# Patient Record
Sex: Male | Born: 1970
Health system: Southern US, Community
[De-identification: ages and names within clinical notes are randomized; demographics above are authoritative.]

## PROBLEM LIST (undated history)

## (undated) DIAGNOSIS — L97929 Non-pressure chronic ulcer of unspecified part of left lower leg with unspecified severity: Secondary | ICD-10-CM

## (undated) DIAGNOSIS — L97509 Non-pressure chronic ulcer of other part of unspecified foot with unspecified severity: Secondary | ICD-10-CM

## (undated) DIAGNOSIS — F411 Generalized anxiety disorder: Secondary | ICD-10-CM

## (undated) DIAGNOSIS — Z8669 Personal history of other diseases of the nervous system and sense organs: Secondary | ICD-10-CM

## (undated) DIAGNOSIS — E119 Type 2 diabetes mellitus without complications: Secondary | ICD-10-CM

## (undated) DIAGNOSIS — M869 Osteomyelitis, unspecified: Secondary | ICD-10-CM

## (undated) DIAGNOSIS — I700 Atherosclerosis of aorta: Secondary | ICD-10-CM

## (undated) DIAGNOSIS — K219 Gastro-esophageal reflux disease without esophagitis: Secondary | ICD-10-CM

## (undated) DIAGNOSIS — F32A Depression, unspecified: Secondary | ICD-10-CM

## (undated) DIAGNOSIS — L089 Local infection of the skin and subcutaneous tissue, unspecified: Secondary | ICD-10-CM

## (undated) DIAGNOSIS — E11628 Type 2 diabetes mellitus with other skin complications: Secondary | ICD-10-CM

## (undated) DIAGNOSIS — F320 Major depressive disorder, single episode, mild: Secondary | ICD-10-CM

## (undated) DIAGNOSIS — I83029 Varicose veins of left lower extremity with ulcer of unspecified site: Secondary | ICD-10-CM

## (undated) DIAGNOSIS — G4733 Obstructive sleep apnea (adult) (pediatric): Secondary | ICD-10-CM

## (undated) DIAGNOSIS — G4730 Sleep apnea, unspecified: Secondary | ICD-10-CM

## (undated) DIAGNOSIS — E785 Hyperlipidemia, unspecified: Secondary | ICD-10-CM

## (undated) DIAGNOSIS — I10 Essential (primary) hypertension: Secondary | ICD-10-CM

## (undated) DIAGNOSIS — I440 Atrioventricular block, first degree: Secondary | ICD-10-CM

## (undated) HISTORY — DX: Local infection of the skin and subcutaneous tissue, unspecified: E11.628

## (undated) HISTORY — DX: Local infection of the skin and subcutaneous tissue, unspecified: L08.9

## (undated) HISTORY — DX: Hyperlipidemia, unspecified: E78.5

## (undated) HISTORY — DX: Obstructive sleep apnea (adult) (pediatric): G47.33

## (undated) HISTORY — DX: Osteomyelitis, unspecified: M86.9

## (undated) HISTORY — PX: NO PAST SURGERIES: SHX2092

## (undated) HISTORY — DX: Sleep apnea, unspecified: G47.30

## (undated) HISTORY — DX: Type 2 diabetes mellitus without complications: E11.9

## (undated) HISTORY — DX: Personal history of other diseases of the nervous system and sense organs: Z86.69

## (undated) HISTORY — DX: Gastro-esophageal reflux disease without esophagitis: K21.9

---

## 2014-03-02 ENCOUNTER — Encounter (HOSPITAL_COMMUNITY): Payer: Self-pay

## 2014-03-02 ENCOUNTER — Emergency Department (INDEPENDENT_AMBULATORY_CARE_PROVIDER_SITE_OTHER)
Admission: EM | Admit: 2014-03-02 | Discharge: 2014-03-02 | Disposition: A | Discharge status: Nursing Home (Includes ICF) | Payer: Self-pay | Source: Home / Self Care | Attending: Family Medicine | Admitting: Family Medicine

## 2014-03-02 ENCOUNTER — Encounter (HOSPITAL_COMMUNITY): Payer: Self-pay | Admitting: Family Medicine

## 2014-03-02 ENCOUNTER — Emergency Department (HOSPITAL_COMMUNITY): Payer: Self-pay

## 2014-03-02 ENCOUNTER — Inpatient Hospital Stay (HOSPITAL_COMMUNITY)
Admission: EM | Admit: 2014-03-02 | Discharge: 2014-03-06 | DRG: 540 | Disposition: A | Discharge status: Home / Self Care | Payer: Self-pay | Attending: Internal Medicine | Admitting: Internal Medicine

## 2014-03-02 DIAGNOSIS — L8994 Pressure ulcer of unspecified site, stage 4: Secondary | ICD-10-CM

## 2014-03-02 DIAGNOSIS — L03116 Cellulitis of left lower limb: Secondary | ICD-10-CM | POA: Diagnosis present

## 2014-03-02 DIAGNOSIS — I10 Essential (primary) hypertension: Secondary | ICD-10-CM | POA: Diagnosis present

## 2014-03-02 DIAGNOSIS — R609 Edema, unspecified: Secondary | ICD-10-CM

## 2014-03-02 DIAGNOSIS — E878 Other disorders of electrolyte and fluid balance, not elsewhere classified: Secondary | ICD-10-CM | POA: Diagnosis present

## 2014-03-02 DIAGNOSIS — E669 Obesity, unspecified: Secondary | ICD-10-CM | POA: Diagnosis present

## 2014-03-02 DIAGNOSIS — M868X6 Other osteomyelitis, lower leg: Principal | ICD-10-CM | POA: Diagnosis present

## 2014-03-02 DIAGNOSIS — Z6841 Body Mass Index (BMI) 40.0 and over, adult: Secondary | ICD-10-CM

## 2014-03-02 DIAGNOSIS — I83009 Varicose veins of unspecified lower extremity with ulcer of unspecified site: Secondary | ICD-10-CM | POA: Diagnosis present

## 2014-03-02 DIAGNOSIS — F17210 Nicotine dependence, cigarettes, uncomplicated: Secondary | ICD-10-CM | POA: Diagnosis present

## 2014-03-02 DIAGNOSIS — M869 Osteomyelitis, unspecified: Secondary | ICD-10-CM

## 2014-03-02 DIAGNOSIS — L0390 Cellulitis, unspecified: Secondary | ICD-10-CM | POA: Diagnosis present

## 2014-03-02 DIAGNOSIS — L02612 Cutaneous abscess of left foot: Secondary | ICD-10-CM

## 2014-03-02 DIAGNOSIS — L309 Dermatitis, unspecified: Secondary | ICD-10-CM | POA: Diagnosis present

## 2014-03-02 DIAGNOSIS — B964 Proteus (mirabilis) (morganii) as the cause of diseases classified elsewhere: Secondary | ICD-10-CM | POA: Diagnosis present

## 2014-03-02 DIAGNOSIS — L97529 Non-pressure chronic ulcer of other part of left foot with unspecified severity: Secondary | ICD-10-CM | POA: Diagnosis present

## 2014-03-02 DIAGNOSIS — Z720 Tobacco use: Secondary | ICD-10-CM | POA: Diagnosis present

## 2014-03-02 DIAGNOSIS — L089 Local infection of the skin and subcutaneous tissue, unspecified: Secondary | ICD-10-CM | POA: Diagnosis present

## 2014-03-02 DIAGNOSIS — R739 Hyperglycemia, unspecified: Secondary | ICD-10-CM | POA: Diagnosis present

## 2014-03-02 DIAGNOSIS — L97909 Non-pressure chronic ulcer of unspecified part of unspecified lower leg with unspecified severity: Secondary | ICD-10-CM | POA: Diagnosis present

## 2014-03-02 HISTORY — DX: Non-pressure chronic ulcer of other part of unspecified foot with unspecified severity: L97.509

## 2014-03-02 HISTORY — DX: Essential (primary) hypertension: I10

## 2014-03-02 HISTORY — DX: Osteomyelitis, unspecified: M86.9

## 2014-03-02 HISTORY — DX: Varicose veins of left lower extremity with ulcer of unspecified site: I83.029

## 2014-03-02 HISTORY — DX: Varicose veins of left lower extremity with ulcer of unspecified site: L97.929

## 2014-03-02 LAB — I-STAT CHEM 8, ED
BUN: 29 mg/dL — ABNORMAL HIGH (ref 6–23)
CALCIUM ION: 1.01 mmol/L — AB (ref 1.12–1.23)
CHLORIDE: 95 meq/L — AB (ref 96–112)
Creatinine, Ser: 0.8 mg/dL (ref 0.50–1.35)
GLUCOSE: 111 mg/dL — AB (ref 70–99)
HEMATOCRIT: 52 % (ref 39.0–52.0)
HEMOGLOBIN: 17.7 g/dL — AB (ref 13.0–17.0)
Potassium: 5 mEq/L (ref 3.7–5.3)
Sodium: 132 mEq/L — ABNORMAL LOW (ref 137–147)
TCO2: 28 mmol/L (ref 0–100)

## 2014-03-02 LAB — CBC WITH DIFFERENTIAL/PLATELET
BASOS ABS: 0 10*3/uL (ref 0.0–0.1)
BASOS PCT: 0 % (ref 0–1)
Eosinophils Absolute: 0 10*3/uL (ref 0.0–0.7)
Eosinophils Relative: 0 % (ref 0–5)
HEMATOCRIT: 46.3 % (ref 39.0–52.0)
Hemoglobin: 15.5 g/dL (ref 13.0–17.0)
LYMPHS PCT: 11 % — AB (ref 12–46)
Lymphs Abs: 0.8 10*3/uL (ref 0.7–4.0)
MCH: 30.6 pg (ref 26.0–34.0)
MCHC: 33.5 g/dL (ref 30.0–36.0)
MCV: 91.5 fL (ref 78.0–100.0)
MONO ABS: 0.8 10*3/uL (ref 0.1–1.0)
Monocytes Relative: 11 % (ref 3–12)
Neutro Abs: 5.8 10*3/uL (ref 1.7–7.7)
Neutrophils Relative %: 78 % — ABNORMAL HIGH (ref 43–77)
PLATELETS: 272 10*3/uL (ref 150–400)
RBC: 5.06 MIL/uL (ref 4.22–5.81)
RDW: 12.9 % (ref 11.5–15.5)
WBC: 7.5 10*3/uL (ref 4.0–10.5)

## 2014-03-02 LAB — I-STAT CG4 LACTIC ACID, ED: LACTIC ACID, VENOUS: 2.2 mmol/L (ref 0.5–2.2)

## 2014-03-02 MED ORDER — MORPHINE SULFATE 2 MG/ML IJ SOLN
2.0000 mg | INTRAMUSCULAR | Status: DC | PRN
  Filled 2014-03-02: qty 1

## 2014-03-02 MED ORDER — PIPERACILLIN-TAZOBACTAM 3.375 G IVPB 30 MIN
3.3750 g | Freq: Once | INTRAVENOUS | Status: AC
  Administered 2014-03-02: 3.375 g via INTRAVENOUS
  Filled 2014-03-02: qty 50

## 2014-03-02 MED ORDER — INFLUENZA VAC SPLIT QUAD 0.5 ML IM SUSY
0.5000 mL | PREFILLED_SYRINGE | INTRAMUSCULAR | Status: AC
  Administered 2014-03-03: 0.5 mL via INTRAMUSCULAR
  Filled 2014-03-02: qty 0.5

## 2014-03-02 MED ORDER — SODIUM CHLORIDE 0.9 % IV SOLN: INTRAVENOUS | Status: DC

## 2014-03-02 MED ORDER — ONDANSETRON HCL 4 MG/2ML IJ SOLN: 4.0000 mg | Freq: Four times a day (QID) | INTRAMUSCULAR | Status: DC | PRN

## 2014-03-02 MED ORDER — VANCOMYCIN HCL 10 G IV SOLR
1500.0000 mg | Freq: Once | INTRAVENOUS | Status: AC
  Administered 2014-03-02: 1500 mg via INTRAVENOUS
  Filled 2014-03-02: qty 1500

## 2014-03-02 MED ORDER — MAGNESIUM HYDROXIDE 400 MG/5ML PO SUSP: 30.0000 mL | Freq: Every day | ORAL | Status: DC | PRN

## 2014-03-02 MED ORDER — ACETAMINOPHEN 650 MG RE SUPP: 650.0000 mg | Freq: Four times a day (QID) | RECTAL | Status: DC | PRN

## 2014-03-02 MED ORDER — ENOXAPARIN SODIUM 40 MG/0.4ML ~~LOC~~ SOLN
40.0000 mg | SUBCUTANEOUS | Status: DC
  Administered 2014-03-02 – 2014-03-03 (×2): 40 mg via SUBCUTANEOUS
  Filled 2014-03-02 (×3): qty 0.4

## 2014-03-02 MED ORDER — VANCOMYCIN HCL 10 G IV SOLR
1250.0000 mg | Freq: Two times a day (BID) | INTRAVENOUS | Status: DC
  Administered 2014-03-03 – 2014-03-05 (×5): 1250 mg via INTRAVENOUS
  Filled 2014-03-02 (×7): qty 1250

## 2014-03-02 MED ORDER — ACETAMINOPHEN 325 MG PO TABS: 650.0000 mg | ORAL_TABLET | Freq: Four times a day (QID) | ORAL | Status: DC | PRN

## 2014-03-02 MED ORDER — HYDROCODONE-ACETAMINOPHEN 5-325 MG PO TABS
1.0000 | ORAL_TABLET | ORAL | Status: DC | PRN
  Filled 2014-03-02: qty 2

## 2014-03-02 MED ORDER — CETYLPYRIDINIUM CHLORIDE 0.05 % MT LIQD: 7.0000 mL | Freq: Two times a day (BID) | OROMUCOSAL | Status: DC

## 2014-03-02 MED ORDER — ONDANSETRON HCL 4 MG PO TABS
4.0000 mg | ORAL_TABLET | Freq: Four times a day (QID) | ORAL | Status: DC | PRN
Start: 2014-03-02 — End: 2014-03-06

## 2014-03-02 MED ORDER — ZOLPIDEM TARTRATE 5 MG PO TABS: 5.0000 mg | ORAL_TABLET | Freq: Every evening | ORAL | Status: DC | PRN

## 2014-03-02 MED ORDER — VANCOMYCIN HCL IN DEXTROSE 1-5 GM/200ML-% IV SOLN
1000.0000 mg | Freq: Once | INTRAVENOUS | Status: AC
  Administered 2014-03-02: 1000 mg via INTRAVENOUS
  Filled 2014-03-02: qty 200

## 2014-03-02 MED ORDER — PNEUMOCOCCAL VAC POLYVALENT 25 MCG/0.5ML IJ INJ
0.5000 mL | INJECTION | INTRAMUSCULAR | Status: AC
  Administered 2014-03-03: 0.5 mL via INTRAMUSCULAR
  Filled 2014-03-02: qty 0.5

## 2014-03-02 MED ORDER — SODIUM CHLORIDE 0.9 % IV SOLN
INTRAVENOUS | Status: DC
  Administered 2014-03-02 – 2014-03-04 (×2): via INTRAVENOUS

## 2014-03-02 MED ORDER — VANCOMYCIN HCL 10 G IV SOLR
2500.0000 mg | Freq: Once | INTRAVENOUS | Status: DC
  Filled 2014-03-02: qty 2500

## 2014-03-02 MED ORDER — SILVER SULFADIAZINE 1 % EX CREA
TOPICAL_CREAM | Freq: Every day | CUTANEOUS | Status: DC
  Administered 2014-03-02 – 2014-03-03 (×2): via TOPICAL
  Administered 2014-03-04: 1 via TOPICAL
  Administered 2014-03-05: 10:00:00 via TOPICAL
  Administered 2014-03-06: 1 via TOPICAL
  Filled 2014-03-02: qty 85

## 2014-03-02 MED ORDER — PIPERACILLIN-TAZOBACTAM 3.375 G IVPB
3.3750 g | Freq: Three times a day (TID) | INTRAVENOUS | Status: DC
  Administered 2014-03-03 – 2014-03-06 (×10): 3.375 g via INTRAVENOUS
  Filled 2014-03-02 (×13): qty 50

## 2014-03-02 NOTE — ED Notes (Signed)
Attempted Report x1.   

## 2014-03-02 NOTE — Consult Note (Signed)
Reason for Consult: Cellulitis dermatitis venous insufficiency left leg with osteomyelitis first metatarsal left foot Referring Physician: Dr. Dorris Singh is an 43 y.o. male.  HPI: Patient is a 44 year old gentleman with no reported history of diabetes who presents at this time with a chronic ulcer over the medial border first metatarsal left foot. Patient presented with multiple layers of foul-smelling socks covering his lower extremities.  Past Medical History  Diagnosis Date  . Hypertension     History reviewed. No pertinent past surgical history.  No family history on file.  Social History:  reports that he has been smoking Cigarettes.  He has been smoking about 0.10 packs per day. He does not have any smokeless tobacco history on file. He reports that he does not drink alcohol or use illicit drugs.  Allergies: No Known Allergies  Medications: I have reviewed the patient's current medications.  Results for orders placed or performed during the hospital encounter of 03/02/14 (from the past 48 hour(s))  CBC with Differential     Status: Abnormal   Collection Time: 03/02/14  2:30 PM  Result Value Ref Range   WBC 7.5 4.0 - 10.5 K/uL   RBC 5.06 4.22 - 5.81 MIL/uL   Hemoglobin 15.5 13.0 - 17.0 g/dL   HCT 46.3 39.0 - 52.0 %   MCV 91.5 78.0 - 100.0 fL   MCH 30.6 26.0 - 34.0 pg   MCHC 33.5 30.0 - 36.0 g/dL   RDW 12.9 11.5 - 15.5 %   Platelets 272 150 - 400 K/uL   Neutrophils Relative % 78 (H) 43 - 77 %   Neutro Abs 5.8 1.7 - 7.7 K/uL   Lymphocytes Relative 11 (L) 12 - 46 %   Lymphs Abs 0.8 0.7 - 4.0 K/uL   Monocytes Relative 11 3 - 12 %   Monocytes Absolute 0.8 0.1 - 1.0 K/uL   Eosinophils Relative 0 0 - 5 %   Eosinophils Absolute 0.0 0.0 - 0.7 K/uL   Basophils Relative 0 0 - 1 %   Basophils Absolute 0.0 0.0 - 0.1 K/uL  I-stat chem 8, ed     Status: Abnormal   Collection Time: 03/02/14  2:48 PM  Result Value Ref Range   Sodium 132 (L) 137 - 147 mEq/L   Potassium 5.0  3.7 - 5.3 mEq/L   Chloride 95 (L) 96 - 112 mEq/L   BUN 29 (H) 6 - 23 mg/dL   Creatinine, Ser 0.80 0.50 - 1.35 mg/dL   Glucose, Bld 111 (H) 70 - 99 mg/dL   Calcium, Ion 1.01 (L) 1.12 - 1.23 mmol/L   TCO2 28 0 - 100 mmol/L   Hemoglobin 17.7 (H) 13.0 - 17.0 g/dL   HCT 52.0 39.0 - 52.0 %  I-Stat CG4 Lactic Acid, ED     Status: None   Collection Time: 03/02/14  2:49 PM  Result Value Ref Range   Lactic Acid, Venous 2.20 0.5 - 2.2 mmol/L    Dg Tibia/fibula Left  03/02/2014   CLINICAL DATA:  Swelling and redness and soft tissue ulcerations with cellulitis in the lower leg.  EXAM: LEFT TIBIA AND FIBULA - 2 VIEW  COMPARISON:  None.  FINDINGS: There is no fracture or dislocation or other acute osseous abnormality. The patient has extensive soft tissue calcifications in the lower leg. The possibility of lupus or dermatomyositis should be considered.  There is also periosteal reaction along the distal fibula.  IMPRESSION: No acute osseous abnormality. Extensive soft tissue calcifications  which can be seen with collagen vascular diseases and renal failure.   Electronically Signed   By: Rozetta Nunnery M.D.   On: 03/02/2014 15:45   Dg Ankle Complete Left  03/02/2014   CLINICAL DATA:  Soft tissue ulcerations and infection.  EXAM: LEFT ANKLE COMPLETE - 3+ VIEW  COMPARISON:  None.  FINDINGS: Again noted are extensive soft tissue calcifications in the distal lower leg and foot. There is slight periosteal reaction along the distal tibia and fibula. The bones of the ankle are normal. No appreciable joint effusion. There is diffuse soft tissue swelling.  IMPRESSION: Soft tissue calcifications and periosteal reaction are nonspecific but can be seen with autoimmune diseases such as lupus or dermatomyositis.   Electronically Signed   By: Rozetta Nunnery M.D.   On: 03/02/2014 15:48   Dg Foot Complete Left  03/02/2014   CLINICAL DATA:  Cellulitis and soft tissue ulcerations.  EXAM: LEFT FOOT - COMPLETE 3+ VIEW  COMPARISON:   None.  FINDINGS: There is a deep ulceration on the medial aspect of the foot at the level of the first tarsal metatarsal joint. There is destruction of the base of the first metatarsal as well as of the distal medial cuneiform. There is periosteal reaction along the shaft of the first metatarsal. The other bones of the foot appear normal. Again noted is diffuse soft tissue swelling as well as soft tissue calcifications and periosteal reaction along the distal fibula.  IMPRESSION: Osteomyelitis of the medial cuneiform and base of the first metatarsal bone destruction.   Electronically Signed   By: Rozetta Nunnery M.D.   On: 03/02/2014 15:51    Review of Systems  All other systems reviewed and are negative.  Blood pressure 133/80, pulse 91, temperature 98.2 F (36.8 C), temperature source Oral, resp. rate 24, height 5\' 10"  (1.778 m), weight 135.172 kg (298 lb), SpO2 98 %. Physical Exam On examination patient has a strong dorsalis pedis pulse bilaterally. He has venous stasis changes in the right leg with extremely dirty foul-smelling debris and crusted on the right foot. Examination of the left lower extremity he has massive venous stasis swelling the left leg was cellulitis dermatitis and venous stasis ulceration with clear drainage. Examination of foot he has extremely foul smelling foot he has an ulcer over the medial aspect of the first metatarsal which does probe to bone. Radiograph shows no destructive changes within the bone. Assessment/Plan: Assessment: Venous stasis insufficiency with cellulitis and dermatitis of the left leg with venous stasis ulceration with cellulitis involving the left foot and a Wagner grade 3 ulcer with exposed bone of the first metatarsal.  Plan: I agree with the IV antibiotics. I believe it's worth trying the limb salvage intervention with IV antibiotics elevation compression and dressing changes. Patient may have a savable foot may require a first ray amputation but lets  give him some time with the conservative therapy in hospital treatment to see if the limb can be salvaged.  Aroura Vasudevan V 03/02/2014, 7:26 PM

## 2014-03-02 NOTE — ED Notes (Signed)
Lactic acid results given to Dr. Sabra Heck

## 2014-03-02 NOTE — ED Notes (Signed)
C/o L foot pain and infection onset 2 weeks ago.  Large foul smelling wound to instep of L foot where sock has cut into tissue.  Socks had to be cut off.  Redness  3/4 the way up L lower leg. Foot and leg are swollen.  Foot is draining serous sanguinous fluid.

## 2014-03-02 NOTE — H&P (Signed)
[facility] History and Physical  Lamorris Knoblock WUJ:[id number] DOB: 07/26/1970 DOA: 03/02/2014  Referring physician: Jaquita Rector PCP: No PCP Per Patient   Chief Complaint: leg swelling  HPI: Juan Kline is a 43 y.o. male  With h/o untreated HTN, smoking, has not seen a physician since 2009, presents to urgent care under the urging of his mother with left leg swelling and redness for a few weeks. He hit his leg on something, can't remember what. Began swelling and became red. 5 pairs of socks removed from right foot, with dirt and gravel removed from sock.  10 pairs of socks removed from left foot, had to be cut off and had eroded into the medial foot, with exposed bone.  Admitted to not removing the socks for 4-6 months.  Denies f/c. Denies diabetes. xrays show osteomyelitis of first metatarsal. Orthopedics to see, but request medicine admission  Review of Systems:  As per HPI. Complete ROS otherwise negative  Past Medical History  Diagnosis Date  . Hypertension    History reviewed. No pertinent past surgical history. Social History:  reports that he has been smoking Cigarettes.  He has been smoking about 0.10 packs per day. He does not have any smokeless tobacco history on file. He reports that he does not drink alcohol or use illicit drugs.  No Known Allergies  FH: mother with htn, hld   Prior to Admission medications   Medication Sig Start Date End Date Taking? Authorizing Provider  naproxen sodium (ANAPROX) 220 MG tablet Take 220 mg by mouth as needed (for pain).    Yes Historical Provider, MD   Physical Exam: Filed Vitals:   03/02/14 1921 03/02/14 1945 03/02/14 2000 03/02/14 2113  BP:  114/70 118/74   Pulse: 91 85 85   Temp:      TempSrc:      Resp: 24 23 17    Height:    5\' 10"  (1.778 m)  Weight:    132.133 kg (291 lb 4.8 oz)  SpO2: 98% 95% 95%     Wt Readings from Last 3 Encounters:  03/02/14 132.133 kg (291 lb 4.8 oz)   BP 160/84 mmHg  Pulse 90  Temp(Src) 98.4 F  (36.9 C) (Oral)  Resp 20  Ht 5\' 10"  (1.778 m)  Wt 132.133 kg (291 lb 4.8 oz)  BMI 41.80 kg/m2  SpO2 97%  General Appearance:    Alert, cooperative, no distress, appears stated age  Head:    Normocephalic, without obvious abnormality, atraumatic  Eyes:    PERRL, conjunctiva/corneas clear, EOM's intact     Nose:   Nares normal, septum midline, mucosa normal, no drainage   or sinus tenderness  Throat:   Lips, mucosa, and tongue normal; teeth and gums normal  Neck:   Supple, symmetrical, trachea midline, no adenopathy;       thyroid:  No enlargement/tenderness/nodules; no carotid   bruit or JVD  Back:     Symmetric, no curvature, ROM normal, no CVA tenderness  Lungs:     Clear to auscultation bilaterally, respirations unlabored  Chest wall:    No tenderness or deformity  Heart:    Regular rate and rhythm, S1 and S2 normal, no murmur, rub   or gallop  Abdomen:     Soft, non-tender, bowel sounds active all four quadrants,    no masses, no organomegaly  Extremities:   Right leg with hyperpigmentation pretibial area, right foot malodorous, caked in dirt.  Left pretibial area with cellulitis, venous stasis changes and  ulceration.  Right foot with malodor, erythema, and medial ulceration with exposed bone. Pulses palpable in both feet  Pulses:   2+ and symmetric all extremities  Skin:   See above  Lymph nodes:   Cervical, supraclavicular, and axillary nodes normal  Neurologic:   CNII-XII intact. Normal strength, sensation and reflexes      throughout    Psych: guarded. Vague. cooperative        Labs on Admission:  Basic Metabolic Panel:  Recent Labs Lab 03/02/14 1448  NA 132*  K 5.0  CL 95*  GLUCOSE 111*  BUN 29*  CREATININE 0.80   Liver Function Tests: No results for input(s): AST, ALT, ALKPHOS, BILITOT, PROT, ALBUMIN in the last 168 hours. No results for input(s): LIPASE, AMYLASE in the last 168 hours. No results for input(s): AMMONIA in the last 168 hours. CBC:  Recent  Labs Lab 03/02/14 1430 03/02/14 1448  WBC 7.5  --   NEUTROABS 5.8  --   HGB 15.5 17.7*  HCT 46.3 52.0  MCV 91.5  --   PLT 272  --    Cardiac Enzymes: No results for input(s): CKTOTAL, CKMB, CKMBINDEX, TROPONINI in the last 168 hours.  BNP (last 3 results) No results for input(s): PROBNP in the last 8760 hours. CBG: No results for input(s): GLUCAP in the last 168 hours.  Radiological Exams on Admission: Dg Tibia/fibula Left  03/02/2014   CLINICAL DATA:  Swelling and redness and soft tissue ulcerations with cellulitis in the lower leg.  EXAM: LEFT TIBIA AND FIBULA - 2 VIEW  COMPARISON:  None.  FINDINGS: There is no fracture or dislocation or other acute osseous abnormality. The patient has extensive soft tissue calcifications in the lower leg. The possibility of lupus or dermatomyositis should be considered.  There is also periosteal reaction along the distal fibula.  IMPRESSION: No acute osseous abnormality. Extensive soft tissue calcifications which can be seen with collagen vascular diseases and renal failure.   Electronically Signed   By: Rozetta Nunnery M.D.   On: 03/02/2014 15:45   Dg Ankle Complete Left  03/02/2014   CLINICAL DATA:  Soft tissue ulcerations and infection.  EXAM: LEFT ANKLE COMPLETE - 3+ VIEW  COMPARISON:  None.  FINDINGS: Again noted are extensive soft tissue calcifications in the distal lower leg and foot. There is slight periosteal reaction along the distal tibia and fibula. The bones of the ankle are normal. No appreciable joint effusion. There is diffuse soft tissue swelling.  IMPRESSION: Soft tissue calcifications and periosteal reaction are nonspecific but can be seen with autoimmune diseases such as lupus or dermatomyositis.   Electronically Signed   By: Rozetta Nunnery M.D.   On: 03/02/2014 15:48   Dg Foot Complete Left  03/02/2014   CLINICAL DATA:  Cellulitis and soft tissue ulcerations.  EXAM: LEFT FOOT - COMPLETE 3+ VIEW  COMPARISON:  None.  FINDINGS: There is  a deep ulceration on the medial aspect of the foot at the level of the first tarsal metatarsal joint. There is destruction of the base of the first metatarsal as well as of the distal medial cuneiform. There is periosteal reaction along the shaft of the first metatarsal. The other bones of the foot appear normal. Again noted is diffuse soft tissue swelling as well as soft tissue calcifications and periosteal reaction along the distal fibula.  IMPRESSION: Osteomyelitis of the medial cuneiform and base of the first metatarsal bone destruction.   Electronically Signed   By: Clair Gulling  Maxwell M.D.   On: 03/02/2014 15:51    Assessment/Plan Principal Problem:   Foot infection Active Problems:   Cellulitis of left leg and foot   Osteomyelitis left first metatarsal   Foot ulcer, left   Tobacco abuse   history of Hypertension   Obesity   Venous stasis ulcer  Start vancomycin and zosyn empirically. Await ortho recs. Lovenox. Monitor blood pressure and start antihypertensives if elevated. Tobacco cessation counselling  Time spent: 60 min  [facility]

## 2014-03-02 NOTE — ED Provider Notes (Addendum)
CSN: [id number]     Arrival date & time 03/02/14  1151 History   First MD Initiated Contact with Patient 03/02/14 1208     Chief Complaint  Patient presents with  . Foot Pain   (Consider location/radiation/quality/duration/timing/severity/associated sxs/prior Treatment) HPI Leg swelling and discharge over the last couple of weeks. Progressively worse. Has occurred in the past but typically resolves by itself. Now w/ foul odor and ttp. Denies fevers, nausea, CP, sob, syncope, malaise. Tried H2O2 w/o benefit.   Past Medical History  Diagnosis Date  . Hypertension    History reviewed. No pertinent past surgical history. History reviewed. No pertinent family history. History  Substance Use Topics  . Smoking status: Light Tobacco Smoker -- 0.10 packs/day    Types: Cigarettes  . Smokeless tobacco: Not on file  . Alcohol Use: No    Review of Systems Per HPI with all other pertinent systems negative.   Allergies  Review of patient's allergies indicates no known allergies.  Home Medications   Prior to Admission medications   Not on File   BP 178/103 mmHg  Pulse 103  Temp(Src) 98.2 F (36.8 C) (Oral)  Resp 16  SpO2 95% Physical Exam  Constitutional: He is oriented to person, place, and time. He appears well-developed and well-nourished. No distress.  HENT:  Head: Normocephalic and atraumatic.  Eyes: EOM are normal. Pupils are equal, round, and reactive to light.  Neck: Normal range of motion.  Cardiovascular: Normal rate.   No murmur heard. Pulmonary/Chest: Effort normal and breath sounds normal.  Abdominal: Soft. He exhibits no distension.  Musculoskeletal: Normal range of motion. He exhibits edema.  Neurological: He is alert and oriented to person, place, and time.  Skin: He is not diaphoretic.  See pictures below Extensor hallucis longus tendon exposed ttp   Psychiatric: He has a normal mood and affect. His behavior is normal. Judgment and thought content normal.               ED Course  Procedures (including critical care time) Labs Review Labs Reviewed  WOUND CULTURE    Imaging Review No results found.   MDM   1. Pressure ulcer with necrosis of soft tissues through to underlying muscle, tendon, or bone   2. Cellulitis of left lower extremity   3. Essential hypertension    Last visit to doctor was in 2009  5 pairs of socks removed from R foot w/ large amount of gravel and dirt removed from foot 10 pairs of socks removed from L foot. Extremely foul odor. Most inner pair of socks ulcerated into the skin. Pt pressed on hygiene and admitted to wearing the most inner pair of socks for months (4-6+).  Extensor tendon exposed in wound  Pt in need of extensive workup including basic medical labs to evaluate DM and HLD status  Paged Dr. Alvan Dame for consult as he is the covering ortho physician for [facility]. Awaiting return call to discuss case. Concern for limb threatening wound and infection.    Pt fortunately is not showing systemic symptoms of the infection.   [facility] sent  Linna Darner, MD Family Medicine 03/02/2014, 1:40 PM    __________________  Addendum  Discussed case w/ Dr. Alvan Dame who has agreed to see the pt after medicine admit.  Discussed case w/ Dr. Sabra Heck at the [place], MD 03/02/14 [phone]

## 2014-03-02 NOTE — ED Notes (Signed)
Ortho MD at the bedside.

## 2014-03-02 NOTE — ED Notes (Signed)
Rolene Arbour, RN at the bedside attempting IV.

## 2014-03-02 NOTE — ED Notes (Signed)
MD at the bedside  

## 2014-03-02 NOTE — ED Notes (Signed)
Attempted IV x2. Unable to access. Rolene Arbour, RN to attempt IV.

## 2014-03-02 NOTE — ED Notes (Signed)
Patient returned from [place]. Placed back on the monitor.

## 2014-03-02 NOTE — ED Notes (Signed)
Per EMS, Patient is sent over from [facility] with foot infection stated as Gangrene. When at Urgent Care, Patient's foot was to have a laceration on the left foot. Patient had it irrigated and repatched. Erythema and Edema noted to the left leg up until the mid calf. Patient alert and oriented x4. Vitals per EMS: 168/104, 100 HR, 16 RR, 96 %, 98.2 F

## 2014-03-02 NOTE — Progress Notes (Addendum)
ANTIBIOTIC CONSULT NOTE - INITIAL  Pharmacy Consult for vancomycin Indication: wound infection  No Known Allergies  Patient Measurements: Height: 5\' 10"  (177.8 cm) Weight: 298 lb (135.172 kg) IBW/kg (Calculated) : 73  Vital Signs: Temp: 100.1 F (37.8 C) (11/19 1611) Temp Source: Oral (11/19 1611) BP: 143/81 mmHg (11/19 1611) Pulse Rate: 94 (11/19 1611) Intake/Output from previous day:   Intake/Output from this shift:    Labs:  Recent Labs  03/02/14 1430 03/02/14 1448  WBC 7.5  --   HGB 15.5 17.7*  PLT 272  --   CREATININE  --  0.80   Estimated Creatinine Clearance: 166.6 mL/min (by C-G formula based on Cr of 0.8). No results for input(s): VANCOTROUGH, VANCOPEAK, VANCORANDOM, GENTTROUGH, GENTPEAK, GENTRANDOM, TOBRATROUGH, TOBRAPEAK, TOBRARND, AMIKACINPEAK, AMIKACINTROU, AMIKACIN in the last 72 hours.   Microbiology: No results found for this or any previous visit (from the past 720 hour(s)).  Medical History: Past Medical History  Diagnosis Date  . Hypertension    Assessment: 66 m who presented to the ED on 11/19 for L foot pain and wound infection onset ~2 weeks ago. Pharmacy is consulted to begin vancomycin.  Wbc 7.5, tmax 100.1, SCr 0.80, CrCl >100 ml/min.  Goal of Therapy:  Vancomycin trough level 10-15 mcg/ml  Plan:  Vancomycin load 2,500 mg x 1 (pt has received 1000 mg x 1, will add 1,500 mg to make the 2,500 mg load) Followed by vancomycin 1,250 mg IV q12hrs VT at [facility] Monitor CBC, temperature curve, renal function, clinical course  Cassie L. Nicole Kindred, PharmD Clinical Pharmacy Resident Pager: [phone] 03/02/2014 5:54 PM

## 2014-03-02 NOTE — ED Provider Notes (Signed)
CSN: [id number]     Arrival date & time 03/02/14  1333 History   First MD Initiated Contact with Patient 03/02/14 1334     Chief Complaint  Patient presents with  . Recurrent Skin Infections     (Consider location/radiation/quality/duration/timing/severity/associated sxs/prior Treatment) HPI Comments: The patient is an overweight 43 year old male who is out of work, states that he had bumped his left lower extremity over the anterior shin into a metallic structure several weeks ago and since that time his head constant progressive worsening pain and swelling to his left lower extremity including his lower extremity below the knee, ankle and foot. Instead of seeking medical attention, the patient tried to ignore this, finally his mother today side and brought him to medical attention. Initially he went to the urgent care, he was redirected to the emergency department for further evaluation and consultation and likely admission and antibiotics. The patient denies fevers or chills, nothing seems to make the symptoms better or worse, he chose to treat this injury by putting more socks on it.  The history is provided by the patient and a relative.    Past Medical History  Diagnosis Date  . Hypertension    History reviewed. No pertinent past surgical history. No family history on file. History  Substance Use Topics  . Smoking status: Light Tobacco Smoker -- 0.10 packs/day    Types: Cigarettes  . Smokeless tobacco: Not on file  . Alcohol Use: No    Review of Systems  All other systems reviewed and are negative.     Allergies  Review of patient's allergies indicates no known allergies.  Home Medications   Prior to Admission medications   Not on File   BP 149/80 mmHg  Pulse 97  Temp(Src) 99 F (37.2 C) (Oral)  Resp 19  Ht 5\' 10"  (1.778 m)  Wt 298 lb (135.172 kg)  BMI 42.76 kg/m2  SpO2 89% Physical Exam  Constitutional: He appears well-developed and well-nourished. No  distress.  HENT:  Head: Normocephalic and atraumatic.  Mouth/Throat: Oropharynx is clear and moist. No oropharyngeal exudate.  Eyes: Conjunctivae and EOM are normal. Pupils are equal, round, and reactive to light. Right eye exhibits no discharge. Left eye exhibits no discharge. No scleral icterus.  Neck: Normal range of motion. Neck supple. No JVD present. No thyromegaly present.  Cardiovascular: Normal rate, regular rhythm, normal heart sounds and intact distal pulses.  Exam reveals no gallop and no friction rub.   No murmur heard. Pulmonary/Chest: Effort normal and breath sounds normal. No respiratory distress. He has no wheezes. He has no rales.  Abdominal: Soft. Bowel sounds are normal. He exhibits no distension and no mass. There is no tenderness.  Musculoskeletal: Normal range of motion. He exhibits edema and tenderness.  Left lower extremity is significantly swollen below the knee, it is erythematous, warm to the touch but there is no subcutaneous emphysema or crepitance. There is an area of erythema with a small amount of purulence on the left lateral anterior shin. His left ankle appears very swollen and there is a deep wound extending from medial proximal to the lateral distal across his foot in an oblique fashion, visualized deep structures present, purulence and foul smell present, dorsalis pedis palpable.   Lymphadenopathy:    He has no cervical adenopathy.  Neurological: He is alert. Coordination normal.  Skin: Skin is warm and dry. No rash noted. No erythema.  Psychiatric: He has a normal mood and affect. His behavior is normal.  Nursing note and vitals reviewed.   ED Course  Procedures (including critical care time) Labs Review Labs Reviewed  CBC WITH DIFFERENTIAL - Abnormal; Notable for the following:    Neutrophils Relative % 78 (*)    Lymphocytes Relative 11 (*)    All other components within normal limits  I-STAT CHEM 8, ED - Abnormal; Notable for the following:     Sodium 132 (*)    Chloride 95 (*)    BUN 29 (*)    Glucose, Bld 111 (*)    Calcium, Ion 1.01 (*)    Hemoglobin 17.7 (*)    All other components within normal limits  I-STAT CG4 LACTIC ACID, ED    Imaging Review No results found.    MDM   Final diagnoses:  Swelling  Infection of left foot    Smokes daily - appears ill with acute infection of leg - check labs, imaging for deep tissue infection, r/o osteomyelitis / abscess, NF.  Abx, pain meds, fluids, ortho consultation.  The patient has been given vancomycin, he will need Zosyn, he will be admitted to the hospital under the care of the hospitalist and orthopedics will consult. Discussed with Dr. Conley Canal and Dr. Alvan Dame.  Meds given in ED:  Medications  vancomycin (VANCOCIN) IVPB 1000 mg/200 mL premix (1,000 mg Intravenous New Bag/Given 03/02/14 1432)  piperacillin-tazobactam (ZOSYN) IVPB 3.375 g (not administered)    New Prescriptions   No medications on file      Johnna Acosta, MD 03/02/14 1530

## 2014-03-02 NOTE — ED Notes (Signed)
Patient placed on 4L oxygen. Patient diaphoretic. States he feels hot.

## 2014-03-02 NOTE — ED Notes (Addendum)
[facility] called-non-emergency.  Instructed of wound isolation.

## 2014-03-02 NOTE — Progress Notes (Signed)
Pt arrive to unit in no s/s of distress. Parents at bedside. Pt A&Ox4. Report received from ED prior to pt's arrival. VS stable. Whiteboard updated. Callbell within reach. Will continue to monitor.

## 2014-03-03 LAB — RENAL FUNCTION PANEL
ALBUMIN: 2.2 g/dL — AB (ref 3.5–5.2)
Anion gap: 13 (ref 5–15)
BUN: 15 mg/dL (ref 6–23)
CALCIUM: 8.8 mg/dL (ref 8.4–10.5)
CO2: 27 mEq/L (ref 19–32)
CREATININE: 0.76 mg/dL (ref 0.50–1.35)
Chloride: 96 mEq/L (ref 96–112)
GFR calc Af Amer: >90 mL/min (ref >90)
Glucose, Bld: 142 mg/dL — ABNORMAL HIGH (ref 70–99)
PHOSPHORUS: 3.9 mg/dL (ref 2.3–4.6)
Potassium: 3.9 mEq/L (ref 3.7–5.3)
Sodium: 136 mEq/L — ABNORMAL LOW (ref 137–147)

## 2014-03-03 LAB — HEMOGLOBIN A1C
HEMOGLOBIN A1C: 6.3 % — AB (ref <5.7)
MEAN PLASMA GLUCOSE: 134 mg/dL — AB (ref <117)

## 2014-03-03 MED ORDER — HYDRALAZINE HCL 20 MG/ML IJ SOLN: 2.0000 mg | Freq: Four times a day (QID) | INTRAMUSCULAR | Status: DC | PRN

## 2014-03-03 NOTE — Progress Notes (Signed)
Utilization review completed.  

## 2014-03-03 NOTE — Clinical Social Work Note (Signed)
Clinical Social Work Department BRIEF PSYCHOSOCIAL ASSESSMENT 03/03/2014  Patient:  [facility]     Account Number:  [id number]     Admit date:  03/02/2014  Clinical Social Worker:  Myles Lipps  Date/Time:  03/03/2014 02:00 PM  Referred by:  Physician  Date Referred:  03/03/2014 Referred for  Homelessness  SNF Placement   Other Referral:   Interview type:  Patient Other interview type:   Patient parents at bedside    PSYCHOSOCIAL DATA Living Status:  FRIEND(S) Admitted from facility:   Level of care:   Primary support name:  Iris Pert  [phone] Primary support relationship to patient:  PARENT Degree of support available:   Adequate    CURRENT CONCERNS Current Concerns  Other - See comment   Other Concerns:   IV antibiotics at discharge    [place] / PLAN Clinical Social Worker met with patient and patient parents at bedside to offer support and discuss patient needs at discharge.  Patient states that he is currently living with a friend at [address] [place], [place] [address].  Patient will not admit to reasoning of the multiple pair of socks on his feet on admission but does confirm housing.  Patient and parents both state that patient has been living there "quite a while" and can return at discharge.  Patient states that he is open to having home health and assistance in the home to manage IV antibiotics.  CSW spoke with CM from another unit, who states that patient could be filed under indigient through [facility] and receive services at home with no current insurance benefit. Patient was reluctant but did state that if he "had" to go to SNF he would but prefers to return to his currently living situation.  CSW to follow up once parents are not present to confirm patient story.  CSW to update unit CM regarding the possibility of IV antibiotics at home.  CSW updated PA.  CSW remains available as needed for support and to confirm discharge  planning needs.   Assessment/plan status:  Psychosocial Support/Ongoing Assessment of Needs Other assessment/ plan:   Information/referral to community resources:   Holiday representative offered SNF resources in which patient reluctantly agreed to, however has stressed his desire to return home    PATIENT'S/FAMILY'S RESPONSE TO PLAN OF CARE: Patient alert and oriented x3 laying in bed.  Patient parents at bedside and adamantly state that patient will do whatever it takes to get his foot better.  Patient with limited engagement in assessment process and allowed his parents to carry out much of the conversation.  Patient family seems to be strained with limited support but still involved in patient care.  Patient and parents verbalized appreciation for CSW support and concern.

## 2014-03-03 NOTE — Progress Notes (Signed)
PROGRESS NOTE  Juan Kline ZJI:[id number] DOB: 1970/12/05 DOA: 03/02/2014 PCP: No PCP Per Patient  HPI/Subjective: Patient is a 43 year old male with a past medical history of hypertension and tobacco abuse. Pt was brought to ED by mother. According to the patient he must have hit his legs and it has progressively gotten more sore and red over the past few weeks. 5 pairs of socks had to be removed from right foot, with dirt and gravel removed from sock. 10 pairs of socks removed from left foot, had to be cut off and had eroded into the medial foot, with exposed bone. He admits to not removing the socks for 4-6 months.X rays showed osteomyelitis of first metatarsal. [name] consulted and wants to try and salvage the leg.     Assessment/Plan:  Earleen Newport grade 3 ulcer with exposed bone of first metatarsal of Left Leg: - Likely a complication of inadequate self care and acute injury  - Seen by Sharol Given. Recommends IV ABX, elevation compression, and dressing changes in order to try and salvage the leg - Analgesics being given PRN  - Continue Vanc and Zosyn along with the above.  -PT and placement.  Venous stasis insufficiency with cellulitis and dermatitis of Left Leg: - See above.  - WBC 7.5 - Wound culture pending  - Dr Sharol Given following   Hypertension:  - Chronic. Uncontrolled for more than 6 years - BP 155/85 on 11/20 - Add Hydralazine PRN   Electrolyte Abnormalities: - Possibly due to rehydration, poor nutrition, or underlying condition - Sodium 132, Chloride 95, BUN 29, Glucose 111, Calcium 1.01, Hbg 17.7.  - No previous labs to reference.  - Kidney panel ordered - CMP in am   Tobacco Abuse - Pt states that he is not currently smoking - Nicotine patch to be ordered on request      DVT Prophylaxis: Lovenox  Code Status: Full Family Communication: None. Patient is alert and agreeable to plan of care.  Disposition Plan: Remains inpatient. Patient may be homeless. CSW following.      Consultants:  Ortho  Procedures:  None  Antibiotics:  Vancomycin 11/19 >>  Zosyn 11/19 >>  Objective: Filed Vitals:   03/02/14 2000 03/02/14 2113 03/02/14 2117 03/03/14 0637  BP: 118/74  160/84 155/85  Pulse: 85  90 93  Temp:   98.4 F (36.9 C) 99.4 F (37.4 C)  TempSrc:   Oral Oral  Resp: 17  20 20   Height:  5\' 10"  (1.778 m)    Weight:  132.133 kg (291 lb 4.8 oz)    SpO2: 95%  97% 92%    Intake/Output Summary (Last 24 hours) at 03/03/14 1256 Last data filed at 03/03/14 1000  Gross per 24 hour  Intake  323.5 ml  Output   1425 ml  Net -1101.5 ml   Filed Weights   03/02/14 1336 03/02/14 2113  Weight: 135.172 kg (298 lb) 132.133 kg (291 lb 4.8 oz)    Exam: General: Unkept male laying comfortably in bed,  NAD, appears stated age  66: EOMI, Anicteic Sclera, MMM. Poor dentition.  Cardiovascular: RRR, S1 S2 auscultated, no rubs, murmurs or gallops.   Respiratory: Clear to auscultation bilaterally with equal chest rise  Abdomen: Obese,  Soft, nontender, nondistended, + bowel sounds  Extremities: Lower legs with stasis changes. Left leg dressing dry and intact. Right foot with skin and dirt build up. Nails thickened.  Neuro: AAOx3, cranial nerves grossly intact. Able to move both legs easily.  Psych: Normal affect and demeanor with intact judgement and insight   Data Reviewed: Basic Metabolic Panel:  Recent Labs Lab 03/02/14 1448  NA 132*  K 5.0  CL 95*  GLUCOSE 111*  BUN 29*  CREATININE 0.80   CBC:  Recent Labs Lab 03/02/14 1430 03/02/14 1448  WBC 7.5  --   NEUTROABS 5.8  --   HGB 15.5 17.7*  HCT 46.3 52.0  MCV 91.5  --   PLT 272  --      Recent Results (from the past 240 hour(s))  Wound culture     Status: None (Preliminary result)   Collection Time: 03/02/14  1:26 PM  Result Value Ref Range Status   Specimen Description WOUND LEFT FOOT  Final   Special Requests NONE  Final   Gram Stain PENDING  Incomplete   Culture   Final     FEW GRAM NEGATIVE RODS Performed at [facility]    Report Status PENDING  Incomplete     Studies: Dg Tibia/fibula Left  03/02/2014   CLINICAL DATA:  Swelling and redness and soft tissue ulcerations with cellulitis in the lower leg.  EXAM: LEFT TIBIA AND FIBULA - 2 VIEW  COMPARISON:  None.  FINDINGS: There is no fracture or dislocation or other acute osseous abnormality. The patient has extensive soft tissue calcifications in the lower leg. The possibility of lupus or dermatomyositis should be considered.  There is also periosteal reaction along the distal fibula.  IMPRESSION: No acute osseous abnormality. Extensive soft tissue calcifications which can be seen with collagen vascular diseases and renal failure.   Electronically Signed   By: Rozetta Nunnery M.D.   On: 03/02/2014 15:45   Dg Ankle Complete Left  03/02/2014   CLINICAL DATA:  Soft tissue ulcerations and infection.  EXAM: LEFT ANKLE COMPLETE - 3+ VIEW  COMPARISON:  None.  FINDINGS: Again noted are extensive soft tissue calcifications in the distal lower leg and foot. There is slight periosteal reaction along the distal tibia and fibula. The bones of the ankle are normal. No appreciable joint effusion. There is diffuse soft tissue swelling.  IMPRESSION: Soft tissue calcifications and periosteal reaction are nonspecific but can be seen with autoimmune diseases such as lupus or dermatomyositis.   Electronically Signed   By: Rozetta Nunnery M.D.   On: 03/02/2014 15:48   Dg Foot Complete Left  03/02/2014   CLINICAL DATA:  Cellulitis and soft tissue ulcerations.  EXAM: LEFT FOOT - COMPLETE 3+ VIEW  COMPARISON:  None.  FINDINGS: There is a deep ulceration on the medial aspect of the foot at the level of the first tarsal metatarsal joint. There is destruction of the base of the first metatarsal as well as of the distal medial cuneiform. There is periosteal reaction along the shaft of the first metatarsal. The other bones of the foot appear  normal. Again noted is diffuse soft tissue swelling as well as soft tissue calcifications and periosteal reaction along the distal fibula.  IMPRESSION: Osteomyelitis of the medial cuneiform and base of the first metatarsal bone destruction.   Electronically Signed   By: Rozetta Nunnery M.D.   On: 03/02/2014 15:51    Scheduled Meds: . enoxaparin (LOVENOX) injection  40 mg Subcutaneous Q24H  . piperacillin-tazobactam (ZOSYN)  IV  3.375 g Intravenous 3 times per day  . silver sulfADIAZINE   Topical Daily  . vancomycin  1,250 mg Intravenous Q12H   Continuous Infusions: . sodium chloride 10 mL/hr at 03/02/14  2142    Principal Problem:   Foot infection Active Problems:   Cellulitis of left leg and foot   Osteomyelitis left first metatarsal   Foot ulcer, left   Tobacco abuse   history of Hypertension   Obesity   Venous stasis ulcer    Colbert Ewing PA-S   Thurnell Lose M.D on 03/03/2014 at 2:23 PM  Between 7am to 7pm - Pager - [phone], After 7pm go to [email or web address] - password [email or web address]  And look for the night coverage person covering me after hours  [facility]  Office  [phone]

## 2014-03-03 NOTE — Plan of Care (Signed)
Problem: Phase I Progression Outcomes Goal: OOB as tolerated unless otherwise ordered Outcome: Progressing     

## 2014-03-03 NOTE — Plan of Care (Signed)
Problem: Phase III Progression Outcomes Goal: Voiding independently Outcome: Completed/Met Date Met:  03/03/14

## 2014-03-03 NOTE — Care Management Note (Signed)
    Page 1 of 2   03/06/2014     2:18:19 PM CARE MANAGEMENT NOTE 03/06/2014  Patient:  [name]   Account Number:  [id number]  Date Initiated:  03/03/2014  Documentation initiated by:  Tomi Bamberger  Subjective/Objective Assessment:   dx gangrene le leg osteo  admit- lives with friends. pta indep.     Action/Plan:   will need home iv abx.   Anticipated DC Date:  03/06/2014   Anticipated DC Plan:  [place]  CM consult  Follow-up appt scheduled      [facility] Choice  HOME HEALTH   Choice offered to / List presented to:  [name]        [place] arranged  HH-1 RN      [place].   Status of service:  Completed, signed off Medicare Important Message given?  NO (If response is "NO", the following Medicare IM given date fields will be blank) Date Medicare IM given:   Medicare IM given by:   Date Additional Medicare IM given:   Additional Medicare IM given by:    Discharge Disposition:  [place]  Per UR Regulation:  Reviewed for med. necessity/level of care/duration of stay  If discussed at [place] of Stay Meetings, dates discussed:    Comments:  11/223/15 [place], BSN [phone] patient for dc home, f/u apt scheduled.  NCM notified [place] that patient was for dc today and still need HHRN for wound care.  03/03/14 [place], BSN [phone] patient lives wiith friends, he states he is not homeless, he can receive iv abx at home.  Patient states he will work with [facility], referral made to [facility] , [place] notified.  Patient will prob need home iv abx.  Patient states he also has transportation home at dc.

## 2014-03-03 NOTE — Plan of Care (Signed)
Problem: Consults Goal: General Medical Patient Education See Patient Education Module for specific education.  Outcome: Completed/Met Date Met:  03/03/14 Goal: Nutrition Consult-if indicated Outcome: Not Applicable Date Met:  69/99/67 Goal: Diabetes Guidelines if Diabetic/Glucose > 140 If diabetic or lab glucose is > 140 mg/dl - Initiate Diabetes/Hyperglycemia Guidelines & Document Interventions  Outcome: Not Applicable Date Met:  22/77/37  Problem: Phase I Progression Outcomes Goal: Pain controlled with appropriate interventions Outcome: Completed/Met Date Met:  03/03/14 Goal: OOB as tolerated unless otherwise ordered Outcome: Progressing Goal: Voiding-avoid urinary catheter unless indicated Outcome: Not Applicable Date Met:  50/51/07 Goal: Hemodynamically stable Outcome: Completed/Met Date Met:  03/03/14  Problem: Phase II Progression Outcomes Goal: Progress activity as tolerated unless otherwise ordered Outcome: Progressing Goal: Vital signs remain stable Outcome: Completed/Met Date Met:  03/03/14 Goal: Obtain order to discontinue catheter if appropriate Outcome: Not Applicable Date Met:  12/52/47  Problem: Phase III Progression Outcomes Goal: Pain controlled on oral analgesia Outcome: Completed/Met Date Met:  03/03/14 Goal: Foley discontinued Outcome: Not Applicable Date Met:  99/80/01  Problem: Discharge Progression Outcomes Goal: Pain controlled with appropriate interventions Outcome: Completed/Met Date Met:  03/03/14 Goal: Hemodynamically stable Outcome: Completed/Met Date Met:  03/03/14

## 2014-03-03 NOTE — Progress Notes (Signed)
Physical Therapy Wound Treatment Patient Details  Name: Juan Kline MRN: [id number] Date of Birth: 16-Aug-1970  Today's Date: 03/03/2014 Time: 6720-9470 Time Calculation (min): 45 min  Subjective  Subjective: I don't know what happened,  but it's been coming on awhile Patient and Family Stated Goals: pt agrees he wants to save his foot.  Pain Score:    Wound Assessment  Wound / Incision (Open or Dehisced) 03/02/14 Other (Comment) Foot Left See comment above. (Cont.) Patient has brown and white flakes surrounding foot. Patient's foot pale until mid ankle and erythema starts. UCC reports patient had ten socks removed from (Active)  Dressing Type Compression wrap;Gauze (Comment);Hydrocolloid;Non adherent 03/03/2014 11:00 AM  Dressing Changed New 03/03/2014 11:00 AM  Dressing Status Clean;Dry;Intact 03/03/2014 11:00 AM  Dressing Change Frequency Daily 03/03/2014 11:00 AM  Site / Wound Assessment Yellow;Pink;Red;Other (Comment) 03/03/2014 11:00 AM  % Wound base Red or Granulating 80% 03/03/2014 11:00 AM  % Wound base Yellow 20% 03/03/2014 11:00 AM  Peri-wound Assessment Intact;Erythema (blanchable) 03/03/2014 11:00 AM  Wound Length (cm) 11 cm 03/03/2014 11:00 AM  Wound Width (cm) 2 cm 03/03/2014 11:00 AM  Wound Depth (cm) 2 cm 03/03/2014 11:00 AM  Margins Unattached edges (unapproximated) 03/03/2014 11:00 AM  Closure None 03/03/2014 11:00 AM  Drainage Amount Minimal 03/03/2014 11:00 AM  Drainage Description Serosanguineous 03/03/2014 11:00 AM  Non-staged Wound Description Full thickness 03/03/2014 11:00 AM  Treatment Cleansed;Debridement (Selective);Hydrotherapy (Pulse lavage);Packing (Saline gauze) 03/03/2014 11:00 AM   Hydrotherapy Pulsed lavage therapy - wound location: Left leg from upper shin to feet, specifically anterior wound and dorsomedial full thickness wound on left foot. Pulsed Lavage with Suction (psi): 8 psi Pulsed Lavage with Suction - Normal Saline Used: 1000 mL Pulsed  Lavage Tip: Tip with splash shield Selective Debridement Selective Debridement - Location: Left leg from upper shin to feet, specifically anterior wound and dorsomedial full thickness wound on left foot. Selective Debridement - Tools Used: Forceps;Scissors (scrubbing with facecloth) Selective Debridement - Tissue Removed: eschar, dead skin/tissue, callous.   Wound Assessment and Plan  Wound Therapy - Assess/Plan/Recommendations Wound Therapy - Clinical Statement: Hydrotherapy will greatly benefit this wound to get the leg cleaned up, PLS to soften and decrease bacterial load at wound site and for scrubbing/debridement to multiple sites to help begin the healing process.  This wound in to the bone and has likely been so for a while.  Suspect osteo. Wound Therapy - Functional Problem List: pt able to stand on the leg to perform basic assisted ADLS Factors Delaying/Impairing Wound Healing: Altered sensation;Infection - systemic/local;Multiple medical problems;Substance abuse Hydrotherapy Plan: Debridement;Dressing change;Patient/family education;Pulsatile lavage with suction Wound Therapy - Frequency: 6X / week Wound Therapy - Current Recommendations: Case manager/social work Wound Therapy - Follow Up Recommendations:  (TBD will need wound care provider.) Wound Plan: see above  Wound Therapy Goals- Improve the function of patient's integumentary system by progressing the wound(s) through the phases of wound healing (inflammation - proliferation - remodeling) by: Decrease Necrotic Tissue to: 5% Decrease Necrotic Tissue - Progress: Goal set today Increase Granulation Tissue to: 95% Increase Granulation Tissue - Progress: Goal set today Improve Drainage Characteristics: Min;Serous Improve Drainage Characteristics - Progress: Goal set today Additional Wound Therapy Goal: prepare for wound vac Additional Wound Therapy Goal - Progress: Goal set today Goals/treatment plan/discharge plan were made  with and agreed upon by patient/family: Yes Time For Goal Achievement: 7 days Wound Therapy - Potential for Goals: Good  Goals will be updated until maximal potential achieved or  discharge criteria met.  Discharge criteria: when goals achieved, discharge from hospital, MD decision/surgical intervention, no progress towards goals, refusal/missing three consecutive treatments without notification or medical reason.  GP     Michiko Lineman, Tessie Fass 03/03/2014, 11:59 AM 03/03/2014  Donnella Sham, [place] [phone]  (pager)

## 2014-03-04 LAB — WOUND CULTURE: Gram Stain: NONE SEEN

## 2014-03-04 MED ORDER — CARVEDILOL 3.125 MG PO TABS
3.1250 mg | ORAL_TABLET | Freq: Two times a day (BID) | ORAL | Status: DC
  Administered 2014-03-04 – 2014-03-06 (×4): 3.125 mg via ORAL
  Filled 2014-03-04 (×6): qty 1

## 2014-03-04 MED ORDER — ENOXAPARIN SODIUM 60 MG/0.6ML ~~LOC~~ SOLN
60.0000 mg | SUBCUTANEOUS | Status: DC
  Administered 2014-03-04 – 2014-03-05 (×2): 60 mg via SUBCUTANEOUS
  Filled 2014-03-04 (×3): qty 0.6

## 2014-03-04 MED ORDER — HYDRALAZINE HCL 20 MG/ML IJ SOLN
10.0000 mg | Freq: Four times a day (QID) | INTRAMUSCULAR | Status: DC | PRN
  Administered 2014-03-05 – 2014-03-06 (×2): 10 mg via INTRAVENOUS
  Filled 2014-03-04 (×2): qty 1

## 2014-03-04 NOTE — Progress Notes (Signed)
PROGRESS NOTE  Juan Kline ZWC:[id number] DOB: 01-Apr-1971 DOA: 03/02/2014 PCP: No PCP Per Patient  HPI/Subjective: Patient is a 43 year old male with a past medical history of hypertension and tobacco abuse. Pt was brought to ED by mother. According to the patient he must have hit his legs and it has progressively gotten more sore and red over the past few weeks. 5 pairs of socks had to be removed from right foot, with dirt and gravel removed from sock. 10 pairs of socks removed from left foot, had to be cut off and had eroded into the medial foot, with exposed bone. He admits to not removing the socks for 4-6 months.X rays showed osteomyelitis of first metatarsal. [name] consulted and wants to try and salvage the leg.     Assessment/Plan:  Ulcer with exposed bone of first metatarsal of Left Leg: - Likely a complication of inadequate self care and acute injury,  getting wound care, seen by Dr. Sharol Given case discussed with him, or now continue IV Vanco Zosyn, after 3-4 days switch to oral antibiotic for total of 2 weeks with close outpatient follow-up with Dr. Sharol Given and placement for wound care.   Venous stasis insufficiency with cellulitis and dermatitis of Left Leg: - Supportive care Dr. Sharol Given following   Hypertension:  - Chronic. Uncontrolled for more than 6 years,  place on scheduled Coreg and as needed hydralazine   Tobacco Abuse - Counseled to quit, nicoderm patch if needed      DVT Prophylaxis: Lovenox  Code Status: Full Family Communication: None. Patient is alert and agreeable to plan of care.  Disposition Plan: Remains inpatient. Patient may be homeless. CSW following.    Consultants:  Ortho  Procedures:  None  Antibiotics:  Vancomycin 11/19 >>  Zosyn 11/19 >>  Objective: Filed Vitals:   03/03/14 0637 03/03/14 1425 03/03/14 2229 03/04/14 0519  BP: 155/85 144/87 139/85 143/84  Pulse: 93 81 88 91  Temp: 99.4 F (37.4 C) 99 F (37.2 C) 98.3 F (36.8 C)  98.2 F (36.8 C)  TempSrc: Oral Oral Oral Oral  Resp: 20 18 16    Height:      Weight:      SpO2: 92% 91% 94% 94%    Intake/Output Summary (Last 24 hours) at 03/04/14 1014 Last data filed at 03/04/14 2353  Gross per 24 hour  Intake 2026.83 ml  Output    575 ml  Net 1451.83 ml   Filed Weights   03/02/14 1336 03/02/14 2113  Weight: 135.172 kg (298 lb) 132.133 kg (291 lb 4.8 oz)    Exam: General: Unkept male laying comfortably in bed,  NAD, appears stated age  63: EOMI, Anicteic Sclera, MMM. Poor dentition.  Cardiovascular: RRR, S1 S2 auscultated, no rubs, murmurs or gallops.   Respiratory: Clear to auscultation bilaterally with equal chest rise  Abdomen: Obese,  Soft, nontender, nondistended, + bowel sounds  Extremities: Lower legs with stasis changes. Left leg dressing dry and intact. Right foot with skin and dirt build up. Nails thickened.  Neuro: AAOx3, cranial nerves grossly intact. Able to move both legs easily.  Psych: Normal affect and demeanor with intact judgement and insight   Data Reviewed: Basic Metabolic Panel:  Recent Labs Lab 03/02/14 1448 03/03/14 1520  NA 132* 136*  K 5.0 3.9  CL 95* 96  CO2  --  27  GLUCOSE 111* 142*  BUN 29* 15  CREATININE 0.80 0.76  CALCIUM  --  8.8  PHOS  --  3.9   CBC:  Recent Labs Lab 03/02/14 1430 03/02/14 1448  WBC 7.5  --   NEUTROABS 5.8  --   HGB 15.5 17.7*  HCT 46.3 52.0  MCV 91.5  --   PLT 272  --      Recent Results (from the past 240 hour(s))  Wound culture     Status: None   Collection Time: 03/02/14  1:26 PM  Result Value Ref Range Status   Specimen Description WOUND LEFT FOOT  Final   Special Requests NONE  Final   Gram Stain   Final    NO WBC SEEN NO SQUAMOUS EPITHELIAL CELLS SEEN FEW GRAM NEGATIVE RODS RARE GRAM POSITIVE COCCI IN PAIRS    Culture   Final    FEW PROTEUS MIRABILIS Performed at [facility]    Report Status 03/04/2014 FINAL  Final   Organism ID, Bacteria PROTEUS  MIRABILIS  Final      Susceptibility   Proteus mirabilis - MIC*    AMPICILLIN <=2 SENSITIVE Sensitive     AMPICILLIN/SULBACTAM <=2 SENSITIVE Sensitive     CEFAZOLIN <=4 SENSITIVE Sensitive     CEFEPIME <=1 SENSITIVE Sensitive     CEFTAZIDIME <=1 SENSITIVE Sensitive     CEFTRIAXONE <=1 SENSITIVE Sensitive     CIPROFLOXACIN <=0.25 SENSITIVE Sensitive     GENTAMICIN <=1 SENSITIVE Sensitive     IMIPENEM 1 SENSITIVE Sensitive     PIP/TAZO <=4 SENSITIVE Sensitive     TOBRAMYCIN <=1 SENSITIVE Sensitive     TRIMETH/SULFA <=20 SENSITIVE Sensitive     * FEW PROTEUS MIRABILIS     Studies: Dg Tibia/fibula Left  03/02/2014   CLINICAL DATA:  Swelling and redness and soft tissue ulcerations with cellulitis in the lower leg.  EXAM: LEFT TIBIA AND FIBULA - 2 VIEW  COMPARISON:  None.  FINDINGS: There is no fracture or dislocation or other acute osseous abnormality. The patient has extensive soft tissue calcifications in the lower leg. The possibility of lupus or dermatomyositis should be considered.  There is also periosteal reaction along the distal fibula.  IMPRESSION: No acute osseous abnormality. Extensive soft tissue calcifications which can be seen with collagen vascular diseases and renal failure.   Electronically Signed   By: Rozetta Nunnery M.D.   On: 03/02/2014 15:45   Dg Ankle Complete Left  03/02/2014   CLINICAL DATA:  Soft tissue ulcerations and infection.  EXAM: LEFT ANKLE COMPLETE - 3+ VIEW  COMPARISON:  None.  FINDINGS: Again noted are extensive soft tissue calcifications in the distal lower leg and foot. There is slight periosteal reaction along the distal tibia and fibula. The bones of the ankle are normal. No appreciable joint effusion. There is diffuse soft tissue swelling.  IMPRESSION: Soft tissue calcifications and periosteal reaction are nonspecific but can be seen with autoimmune diseases such as lupus or dermatomyositis.   Electronically Signed   By: Rozetta Nunnery M.D.   On: 03/02/2014  15:48   Dg Foot Complete Left  03/02/2014   CLINICAL DATA:  Cellulitis and soft tissue ulcerations.  EXAM: LEFT FOOT - COMPLETE 3+ VIEW  COMPARISON:  None.  FINDINGS: There is a deep ulceration on the medial aspect of the foot at the level of the first tarsal metatarsal joint. There is destruction of the base of the first metatarsal as well as of the distal medial cuneiform. There is periosteal reaction along the shaft of the first metatarsal. The other bones of the foot appear normal. Again noted  is diffuse soft tissue swelling as well as soft tissue calcifications and periosteal reaction along the distal fibula.  IMPRESSION: Osteomyelitis of the medial cuneiform and base of the first metatarsal bone destruction.   Electronically Signed   By: Rozetta Nunnery M.D.   On: 03/02/2014 15:51    Scheduled Meds: . enoxaparin (LOVENOX) injection  40 mg Subcutaneous Q24H  . piperacillin-tazobactam (ZOSYN)  IV  3.375 g Intravenous 3 times per day  . silver sulfADIAZINE   Topical Daily  . vancomycin  1,250 mg Intravenous Q12H   Continuous Infusions: . sodium chloride 75 mL/hr at 03/04/14 0534    Principal Problem:   Foot infection Active Problems:   Cellulitis of left leg and foot   Osteomyelitis left first metatarsal   Foot ulcer, left   Tobacco abuse   history of Hypertension   Obesity   Venous stasis ulcer      Lala Lund K M.D on 03/04/2014 at 10:14 AM  Between 7am to 7pm - Pager - [phone], After 7pm go to [email or web address] - password [email or web address]  And look for the night coverage person covering me after hours  [facility]  Office  [phone]

## 2014-03-04 NOTE — Clinical Social Work Note (Addendum)
CSW continues to follow for d/c planning needs. CSW met with patient who states he will receive home health IV ABX at his moms home in [place]. Patient states he will go to SNF for IV ABX, but placement is last resort. Patient stated he will not receive [place] IV ABX where he currently resides. CSW to follow up with mom as patient gave permission to contact her regarding the above. CSW to follow with patient and his mom tomorrow.   [place], [place] Weekend Clinical Social Worker [phone]

## 2014-03-04 NOTE — Evaluation (Signed)
Physical Therapy Evaluation Patient Details Name: Juan Kline MRN: [id number] DOB: Nov 27, 1970 Today's Date: 03/04/2014   History of Present Illness  Adm 03/02/14 with LLE infection including medial foot wound down to the bone with osteomyelitis. PMHx-HTN  Clinical Impression  Patient evaluated by Physical Therapy with no further acute PT needs identified. All education has been completed and the patient has no further questions. Pt denies problems with balance PTA and on items assessed of Berg Balance Assessment he scored perfect 4's. His LLE pain with walking is 2/10 and denied need for education in use of crutch or cane for pain relief. PT is signing off. Thank you for this referral.     Follow Up Recommendations No PT follow up    Equipment Recommendations  None recommended by PT    Recommendations for Other Services       Precautions / Restrictions Restrictions Weight Bearing Restrictions: No      Mobility  Bed Mobility Overal bed mobility: Independent                Transfers Overall transfer level: Independent                  Ambulation/Gait Ambulation/Gait assistance: Supervision;Independent Ambulation Distance (Feet): 90 Feet Assistive device: None Gait Pattern/deviations: Step-through pattern;Wide base of support;Antalgic   Gait velocity interpretation: Below normal speed for age/gender General Gait Details: slightly antalgic; pt denies need for cane or crutch to assist with pain; denies h/o imbalance or falls  Stairs            Wheelchair Mobility    Modified Rankin (Stroke Patients Only)       Balance Overall balance assessment: Independent   Sitting balance-Leahy Scale: Normal     Standing balance support: No upper extremity supported Standing balance-Leahy Scale: Normal                   Standardized Balance Assessment Standardized Balance Assessment : Berg Balance Test Berg Balance Test Sit to Stand: Able to  stand without using hands and stabilize independently Standing Unsupported: Able to stand safely 2 minutes Sitting with Back Unsupported but Feet Supported on Floor or Stool: Able to sit safely and securely 2 minutes Stand to Sit: Sits safely with minimal use of hands Transfers: Able to transfer safely, minor use of hands Standing Unsupported with Eyes Closed: Able to stand 10 seconds safely From Standing, Reach Forward with Outstretched Arm: Can reach confidently >25 cm (10") From Standing Position, Turn to Look Behind Over each Shoulder: Looks behind from both sides and weight shifts well         Pertinent Vitals/Pain Pain Assessment: 0-10 Pain Score: 2  Pain Location: LLE Pain Intervention(s): Limited activity within patient's tolerance;Monitored during session;Repositioned    Home Living Family/patient expects to be discharged to:: Private residence Living Arrangements: Non-relatives/Friends Available Help at Discharge: Friend(s);Available PRN/intermittently Type of Home: House Home Access: Stairs to enter Entrance Stairs-Rails: None Entrance Stairs-Number of Steps: 2 Home Layout: One level Home Equipment: None      Prior Function Level of Independence: Independent         Comments: denies balance problems or inability to complete ADLs     Hand Dominance        Extremity/Trunk Assessment   Upper Extremity Assessment: Overall WFL for tasks assessed           Lower Extremity Assessment: Overall WFL for tasks assessed      Cervical / Trunk Assessment: Normal  Communication   Communication: No difficulties  Cognition Arousal/Alertness: Awake/alert Behavior During Therapy: WFL for tasks assessed/performed Overall Cognitive Status: Within Functional Limits for tasks assessed                      General Comments      Exercises        Assessment/Plan    PT Assessment Patent does not need any further PT services  PT Diagnosis Difficulty  walking   PT Problem List    PT Treatment Interventions     PT Goals (Current goals can be found in the Care Plan section) Acute Rehab PT Goals PT Goal Formulation: All assessment and education complete, DC therapy    Frequency     Barriers to discharge        Co-evaluation               End of Session   Activity Tolerance: Patient tolerated treatment well Patient left: in bed;with call bell/phone within reach Nurse Communication: Mobility status         Time: 2248-2500 PT Time Calculation (min) (ACUTE ONLY): 21 min   Charges:   PT Evaluation $Initial PT Evaluation Tier I: 1 Procedure     PT G Codes:          Ran Tullis Mar 29, 2014, 9:28 AM Pager [phone]

## 2014-03-04 NOTE — Plan of Care (Signed)
Problem: Consults Goal: Skin Care Protocol Initiated - if Braden Score 18 or less If consults are not indicated, leave blank or document N/A Outcome: Progressing     

## 2014-03-04 NOTE — Progress Notes (Signed)
Physical Therapy Wound Treatment Patient Details  Name: Juan Kline MRN: [id number] Date of Birth: 28-Feb-1971  Today's Date: 03/04/2014 Time: 5537-4827 Time Calculation (min): 23 min  Subjective  Subjective: I don't know what happened,  but it's been coming on awhile Patient and Family Stated Goals: pt agrees he wants to save his foot. Date of Onset:  (?4-6 months) Prior Treatments:  (pt wore multiple socks on foot x 4-6 months)  Pain Score:   2/10 during treatment (leg more than foot)  Wound Assessment  Wound / Incision (Open or Dehisced) 03/02/14 Other (Comment) Foot Left See comment above. (Cont.) Patient has brown and white flakes surrounding foot. Patient's foot pale until mid ankle and erythema starts. [facility] reports patient had ten socks removed from (Active)  Dressing Type Compression wrap;Gauze (Comment);Non adherent;ABD;Silver dressings 03/04/2014  9:07 AM  Dressing Changed Changed 03/04/2014  9:07 AM  Dressing Status Clean;Dry;Intact 03/04/2014  9:07 AM  Dressing Change Frequency Daily 03/04/2014  9:07 AM  Site / Wound Assessment Yellow;Pink;Red 03/04/2014  9:07 AM  % Wound base Red or Granulating 85% 03/04/2014  9:07 AM  % Wound base Yellow 15% 03/04/2014  9:07 AM  Peri-wound Assessment Intact;Erythema (blanchable) 03/04/2014  9:07 AM  Wound Length (cm) 11 cm 03/03/2014 11:00 AM  Wound Width (cm) 2 cm 03/03/2014 11:00 AM  Wound Depth (cm) 2 cm 03/03/2014 11:00 AM  Margins Epibole (rolled edges) 03/04/2014  9:07 AM  Closure None 03/04/2014  9:07 AM  Drainage Amount Moderate 03/04/2014  9:07 AM  Drainage Description Serosanguineous 03/04/2014  9:07 AM  Non-staged Wound Description Full thickness 03/04/2014  9:07 AM  Treatment Debridement (Selective);Hydrotherapy (Pulse lavage) 03/04/2014  9:07 AM     Wound / Incision (Open or Dehisced) 03/04/14 left lower anterior leg (Active)  Dressing Type Compression wrap;Non adherent;Silver dressings;Gauze (Comment) 03/04/2014  9:07 AM   Dressing Changed Changed 03/04/2014  9:07 AM  Dressing Status Clean;Dry;Intact 03/04/2014  9:07 AM  Dressing Change Frequency Daily 03/04/2014  9:07 AM  Site / Wound Assessment Yellow;Red 03/04/2014  9:07 AM  % Wound base Red or Granulating 10% 03/04/2014  9:07 AM  % Wound base Yellow 90% 03/04/2014  9:07 AM  Peri-wound Assessment Edema;Erythema (blanchable);Pink 03/04/2014  9:07 AM  Wound Length (cm) 3.5 cm 03/04/2014  9:07 AM  Wound Width (cm) 9 cm 03/04/2014  9:07 AM  Margins Unattached edges (unapproximated) 03/04/2014  9:07 AM  Closure None 03/04/2014  9:07 AM  Drainage Amount Scant 03/04/2014  9:07 AM  Drainage Description Serosanguineous 03/04/2014  9:07 AM  Treatment Debridement (Selective);Hydrotherapy (Pulse lavage) 03/04/2014  9:07 AM   Hydrotherapy Pulsed lavage therapy - wound location: anterior shin wound; foot wound Pulsed Lavage with Suction (psi): 8 psi Pulsed Lavage with Suction - Normal Saline Used: 1000 mL Pulsed Lavage Tip: Tip with splash shield Selective Debridement Selective Debridement - Location: Lt anterior shin wound, Lt medial foot Selective Debridement - Tools Used: Forceps;Scissors (scrubbing with 4x4) Selective Debridement - Tissue Removed: yellow slough and eschar   Wound Assessment and Plan  Wound Therapy - Assess/Plan/Recommendations Wound Therapy - Clinical Statement: Foot wound with significant epibole at wound edges that will slow wound healing. Used forceps and 4x4 to attempt to remove lead edge of epithelial cells. Lower leg wound with adherent yellow eschar resistant to removal. Wound Therapy - Functional Problem List: pt with incr risk of continued infections due to impaired integumentary system Factors Delaying/Impairing Wound Healing: Altered sensation;Infection - systemic/local;Multiple medical problems;Substance abuse Hydrotherapy Plan: Debridement;Dressing change;Patient/family education;Pulsatile  lavage with suction Wound Therapy -  Frequency: 6X / week Wound Therapy - Current Recommendations: Case manager/social work Wound Therapy - Follow Up Recommendations: [place] Wound Plan: see above  Wound Therapy Goals- Improve the function of patient's integumentary system by progressing the wound(s) through the phases of wound healing (inflammation - proliferation - remodeling) by: Decrease Necrotic Tissue to: 5% Decrease Necrotic Tissue - Progress: Progressing toward goal Increase Granulation Tissue to: 95% Increase Granulation Tissue - Progress: Progressing toward goal Improve Drainage Characteristics: Min;Serous Improve Drainage Characteristics - Progress: Progressing toward goal Additional Wound Therapy Goal: prepare for wound vac Additional Wound Therapy Goal - Progress: Progressing toward goal Goals/treatment plan/discharge plan were made with and agreed upon by patient/family: Yes  Goals will be updated until maximal potential achieved or discharge criteria met.  Discharge criteria: when goals achieved, discharge from hospital, MD decision/surgical intervention, no progress towards goals, refusal/missing three consecutive treatments without notification or medical reason.  [name] 03/04/2014, 9:20 AM Pager [phone]

## 2014-03-04 NOTE — Plan of Care (Signed)
Problem: Phase III Progression Outcomes Goal: Activity at appropriate level-compared to baseline (UP IN CHAIR FOR HEMODIALYSIS)  Outcome: Completed/Met Date Met:  03/04/14     

## 2014-03-04 NOTE — Plan of Care (Signed)
Problem: Phase I Progression Outcomes Goal: OOB as tolerated unless otherwise ordered Outcome: Progressing Goal: Initial discharge plan identified Outcome: Progressing  Problem: Phase II Progression Outcomes Goal: Progress activity as tolerated unless otherwise ordered Outcome: Progressing Goal: Discharge plan established Outcome: Progressing  Problem: Discharge Progression Outcomes Goal: Tolerating diet Outcome: Completed/Met Date Met:  03/04/14

## 2014-03-05 LAB — VANCOMYCIN, TROUGH: Vancomycin Tr: 11.5 ug/mL (ref 10.0–20.0)

## 2014-03-05 MED ORDER — VANCOMYCIN HCL 10 G IV SOLR
1750.0000 mg | Freq: Two times a day (BID) | INTRAVENOUS | Status: DC
  Administered 2014-03-05 – 2014-03-06 (×2): 1750 mg via INTRAVENOUS
  Filled 2014-03-05 (×3): qty 1750

## 2014-03-05 NOTE — Plan of Care (Signed)
Problem: Phase I Progression Outcomes Goal: OOB as tolerated unless otherwise ordered Outcome: Completed/Met Date Met:  03/05/14 Goal: Other Phase I Outcomes/Goals Outcome: Not Applicable Date Met:  25/52/58  Problem: Phase II Progression Outcomes Goal: Progress activity as tolerated unless otherwise ordered Outcome: Completed/Met Date Met:  03/05/14 Goal: Other Phase II Outcomes/Goals Outcome: Not Applicable Date Met:  94/83/47

## 2014-03-05 NOTE — Plan of Care (Signed)
Problem: Phase II Progression Outcomes Goal: Discharge plan established Outcome: Progressing     

## 2014-03-05 NOTE — Plan of Care (Signed)
Problem: Phase II Progression Outcomes Goal: IV changed to normal saline lock Outcome: Completed/Met Date Met:  03/05/14     

## 2014-03-05 NOTE — ED Notes (Signed)
Wound culture L foot from [facility]: Few Proteus Mirabilis. Pt. was transferred and admitted. Treated with Zosyn IV Roselyn Meier 03/05/2014

## 2014-03-05 NOTE — Clinical Social Work Note (Signed)
CSW spoke with patient on this date regarding home health for IV ABX. Per patient, he prefers home health for IV ABX at his mother's home because she has experience with caring for someone who has had to have IV ABX and she will be a support for him if he needs to depend on someone. CSW spoke with patient's mother Iris Pert) via telephone. Per Katharine Look, her husband had meningitis, so she has had some experience with someone having to receive ABX via IV. Katharine Look states patient currently lives in a home with a couple who has a small child, so it would not work with him requiring visits at the home from a RN, as he is declining SNF. Patient's mother is agreeable to patient receiving HH IV ABX at her home. CSW to make RNCM aware. No further needs. CSW signing off.  [place], [place] Weekend Clinical Social Worker [phone]

## 2014-03-05 NOTE — Plan of Care (Signed)
Problem: Phase I Progression Outcomes Goal: Initial discharge plan identified Outcome: Completed/Met Date Met:  03/05/14     

## 2014-03-05 NOTE — Progress Notes (Signed)
ANTIBIOTIC CONSULT NOTE - FOLLOW UP  Pharmacy Consult for Vancomycin  Indication: LLE cellulitis + ostemyelitis  No Known Allergies  Patient Measurements: Height: 5\' 10"  (177.8 cm) Weight: 291 lb 4.8 oz (132.133 kg) IBW/kg (Calculated) : 73 Adjusted Body Weight: 95.6kg  Vital Signs: Temp: 98 F (36.7 C) (11/22 0542) Temp Source: Oral (11/22 0542) BP: 146/89 mmHg (11/22 0542) Pulse Rate: 75 (11/22 0542) Intake/Output from previous day: 11/21 0701 - 11/22 0700 In: 300 [IV Piggyback:300] Out: 650 [Urine:650] Intake/Output from this shift:    Labs:  Recent Labs  03/02/14 1430 03/02/14 1448 03/03/14 1520  WBC 7.5  --   --   HGB 15.5 17.7*  --   PLT 272  --   --   CREATININE  --  0.80 0.76   Estimated Creatinine Clearance: 164.4 mL/min (by C-G formula based on Cr of 0.76).  Recent Labs  03/05/14 0530  VANCOTROUGH 11.5     Microbiology: Recent Results (from the past 720 hour(s))  Wound culture     Status: None   Collection Time: 03/02/14  1:26 PM  Result Value Ref Range Status   Specimen Description WOUND LEFT FOOT  Final   Special Requests NONE  Final   Gram Stain   Final    NO WBC SEEN NO SQUAMOUS EPITHELIAL CELLS SEEN FEW GRAM NEGATIVE RODS RARE GRAM POSITIVE COCCI IN PAIRS    Culture   Final    FEW PROTEUS MIRABILIS Performed at [facility]    Report Status 03/04/2014 FINAL  Final   Organism ID, Bacteria PROTEUS MIRABILIS  Final      Susceptibility   Proteus mirabilis - MIC*    AMPICILLIN <=2 SENSITIVE Sensitive     AMPICILLIN/SULBACTAM <=2 SENSITIVE Sensitive     CEFAZOLIN <=4 SENSITIVE Sensitive     CEFEPIME <=1 SENSITIVE Sensitive     CEFTAZIDIME <=1 SENSITIVE Sensitive     CEFTRIAXONE <=1 SENSITIVE Sensitive     CIPROFLOXACIN <=0.25 SENSITIVE Sensitive     GENTAMICIN <=1 SENSITIVE Sensitive     IMIPENEM 1 SENSITIVE Sensitive     PIP/TAZO <=4 SENSITIVE Sensitive     TOBRAMYCIN <=1 SENSITIVE Sensitive     TRIMETH/SULFA <=20  SENSITIVE Sensitive     * FEW PROTEUS MIRABILIS    Anti-infectives    Start     Dose/Rate Route Frequency Ordered Stop   03/03/14 0600  piperacillin-tazobactam (ZOSYN) IVPB 3.375 g     3.375 g12.5 mL/hr over 240 Minutes Intravenous 3 times per day 03/02/14 1743     03/03/14 0600  vancomycin (VANCOCIN) 1,250 mg in sodium chloride 0.9 % 250 mL IVPB     1,250 mg166.7 mL/hr over 90 Minutes Intravenous Every 12 hours 03/02/14 1756     03/02/14 1800  vancomycin (VANCOCIN) 2,500 mg in sodium chloride 0.9 % 500 mL IVPB  Status:  Discontinued     2,500 mg250 mL/hr over 120 Minutes Intravenous  Once 03/02/14 1756 03/02/14 1757   03/02/14 1800  vancomycin (VANCOCIN) 1,500 mg in sodium chloride 0.9 % 500 mL IVPB     1,500 mg250 mL/hr over 120 Minutes Intravenous  Once 03/02/14 1758 03/02/14 2043   03/02/14 1530  piperacillin-tazobactam (ZOSYN) IVPB 3.375 g     3.375 g100 mL/hr over 30 Minutes Intravenous  Once 03/02/14 1529 03/02/14 1820   03/02/14 1415  vancomycin (VANCOCIN) IVPB 1000 mg/200 mL premix     1,000 mg200 mL/hr over 60 Minutes Intravenous  Once 03/02/14 1404 03/02/14 1750  Assessment: 43 yo M presented to the ED on 11/19, found to have LLE cellulitis with osteo of L metatarsal. Pharmacy consulted to start Vancomycin. Received 1500mg  loading dose and was placed on 1250mg  q12h. Vancomycin trough subtherapeutic on 11/21 at 11.5. WBC 7.5 on admission (mose recent), afebrile, SCr 0.79, renal function stable.  UOP 0.4 ml/kg/hr  Goal of Therapy:  Vancomycin trough level 15-20 mcg/ml  Plan:  Increase vancomycin to 1750mg  IV q12hrs Continue Zosyn 3.375g IV q8h Monitor clinical progress and renal function Repeat VT at steady state  Thank you for allowing pharmacy to be part of this patient's care team  Tinley Park, Pharm.D Clinical Pharmacy Resident Pager: [phone] 03/05/2014 .7:47 AM

## 2014-03-05 NOTE — Progress Notes (Signed)
PROGRESS NOTE  Juan Kline EQA:[id number] DOB: 12/29/1970 DOA: 03/02/2014 PCP: No PCP Per Patient  HPI/Subjective: Patient is a 43 year old male with a past medical history of hypertension and tobacco abuse. Pt was brought to ED by mother. According to the patient he must have hit his legs and it has progressively gotten more sore and red over the past few weeks. 5 pairs of socks had to be removed from right foot, with dirt and gravel removed from sock. 10 pairs of socks removed from left foot, had to be cut off and had eroded into the medial foot, with exposed bone. He admits to not removing the socks for 4-6 months.X rays showed osteomyelitis of first metatarsal. [name] consulted and wants to try and salvage the leg.     Assessment/Plan:  Ulcer with exposed bone of first metatarsal of Left Leg: - Likely a complication of inadequate self care and acute injury,  getting wound care, seen by Dr. Sharol Given case discussed with him, or now continue IV Vanco Zosyn, after 3-4 days switch to oral antibiotic for total of 2 weeks with close outpatient follow-up with Dr. Sharol Given and placement for wound care.   Venous stasis insufficiency with cellulitis and dermatitis of Left Leg: - Supportive care Dr. Sharol Given following   Hypertension:  - Chronic. Uncontrolled for more than 6 years,  placed on scheduled Coreg and as needed hydralazine, better.   Tobacco Abuse - Counseled to quit.      DVT Prophylaxis: Lovenox  Code Status: Full Family Communication: None. Patient is alert and agreeable to plan of care.  Disposition Plan: Remains inpatient. Patient may be homeless. CSW following.    Consultants:  Ortho  Procedures:  None  Antibiotics:  Vancomycin 11/19 >>  Zosyn 11/19 >>  Objective: Filed Vitals:   03/04/14 0519 03/04/14 1354 03/04/14 2209 03/05/14 0542  BP: 143/84 148/91 124/70 146/89  Pulse: 91  74 75  Temp: 98.2 F (36.8 C) 98.1 F (36.7 C) 98.5 F (36.9 C) 98 F (36.7 C)   TempSrc: Oral Oral Oral Oral  Resp:  18 18 18   Height:      Weight:      SpO2: 94% 94% 97% 93%    Intake/Output Summary (Last 24 hours) at 03/05/14 0936 Last data filed at 03/05/14 9798  Gross per 24 hour  Intake    300 ml  Output    650 ml  Net   -350 ml   Filed Weights   03/02/14 1336 03/02/14 2113  Weight: 135.172 kg (298 lb) 132.133 kg (291 lb 4.8 oz)     Exam:  General: Unkept male laying comfortably in bed,  NAD, appears stated age  87: EOMI, Anicteic Sclera, MMM. Poor dentition.  Cardiovascular: RRR, S1 S2 auscultated, no rubs, murmurs or gallops.   Respiratory: Clear to auscultation bilaterally with equal chest rise  Abdomen: Obese,  Soft, nontender, nondistended, + bowel sounds  Extremities: Lower legs with stasis changes. Left leg dressing dry and intact. Right foot with skin and dirt build up. Nails thickened.  Neuro: AAOx3, cranial nerves grossly intact. Able to move both legs easily.  Psych: Normal affect and demeanor with intact judgement and insight   Data Reviewed: Basic Metabolic Panel:  Recent Labs Lab 03/02/14 1448 03/03/14 1520  NA 132* 136*  K 5.0 3.9  CL 95* 96  CO2  --  27  GLUCOSE 111* 142*  BUN 29* 15  CREATININE 0.80 0.76  CALCIUM  --  8.8  PHOS  --  3.9   CBC:  Recent Labs Lab 03/02/14 1430 03/02/14 1448  WBC 7.5  --   NEUTROABS 5.8  --   HGB 15.5 17.7*  HCT 46.3 52.0  MCV 91.5  --   PLT 272  --      Recent Results (from the past 240 hour(s))  Wound culture     Status: None   Collection Time: 03/02/14  1:26 PM  Result Value Ref Range Status   Specimen Description WOUND LEFT FOOT  Final   Special Requests NONE  Final   Gram Stain   Final    NO WBC SEEN NO SQUAMOUS EPITHELIAL CELLS SEEN FEW GRAM NEGATIVE RODS RARE GRAM POSITIVE COCCI IN PAIRS    Culture   Final    FEW PROTEUS MIRABILIS Performed at [facility]    Report Status 03/04/2014 FINAL  Final   Organism ID, Bacteria PROTEUS MIRABILIS   Final      Susceptibility   Proteus mirabilis - MIC*    AMPICILLIN <=2 SENSITIVE Sensitive     AMPICILLIN/SULBACTAM <=2 SENSITIVE Sensitive     CEFAZOLIN <=4 SENSITIVE Sensitive     CEFEPIME <=1 SENSITIVE Sensitive     CEFTAZIDIME <=1 SENSITIVE Sensitive     CEFTRIAXONE <=1 SENSITIVE Sensitive     CIPROFLOXACIN <=0.25 SENSITIVE Sensitive     GENTAMICIN <=1 SENSITIVE Sensitive     IMIPENEM 1 SENSITIVE Sensitive     PIP/TAZO <=4 SENSITIVE Sensitive     TOBRAMYCIN <=1 SENSITIVE Sensitive     TRIMETH/SULFA <=20 SENSITIVE Sensitive     * FEW PROTEUS MIRABILIS     Studies: Dg Tibia/fibula Left  03/02/2014   CLINICAL DATA:  Swelling and redness and soft tissue ulcerations with cellulitis in the lower leg.  EXAM: LEFT TIBIA AND FIBULA - 2 VIEW  COMPARISON:  None.  FINDINGS: There is no fracture or dislocation or other acute osseous abnormality. The patient has extensive soft tissue calcifications in the lower leg. The possibility of lupus or dermatomyositis should be considered.  There is also periosteal reaction along the distal fibula.  IMPRESSION: No acute osseous abnormality. Extensive soft tissue calcifications which can be seen with collagen vascular diseases and renal failure.   Electronically Signed   By: Rozetta Nunnery M.D.   On: 03/02/2014 15:45   Dg Ankle Complete Left  03/02/2014   CLINICAL DATA:  Soft tissue ulcerations and infection.  EXAM: LEFT ANKLE COMPLETE - 3+ VIEW  COMPARISON:  None.  FINDINGS: Again noted are extensive soft tissue calcifications in the distal lower leg and foot. There is slight periosteal reaction along the distal tibia and fibula. The bones of the ankle are normal. No appreciable joint effusion. There is diffuse soft tissue swelling.  IMPRESSION: Soft tissue calcifications and periosteal reaction are nonspecific but can be seen with autoimmune diseases such as lupus or dermatomyositis.   Electronically Signed   By: Rozetta Nunnery M.D.   On: 03/02/2014 15:48   Dg  Foot Complete Left  03/02/2014   CLINICAL DATA:  Cellulitis and soft tissue ulcerations.  EXAM: LEFT FOOT - COMPLETE 3+ VIEW  COMPARISON:  None.  FINDINGS: There is a deep ulceration on the medial aspect of the foot at the level of the first tarsal metatarsal joint. There is destruction of the base of the first metatarsal as well as of the distal medial cuneiform. There is periosteal reaction along the shaft of the first metatarsal. The other bones of the foot  appear normal. Again noted is diffuse soft tissue swelling as well as soft tissue calcifications and periosteal reaction along the distal fibula.  IMPRESSION: Osteomyelitis of the medial cuneiform and base of the first metatarsal bone destruction.   Electronically Signed   By: Rozetta Nunnery M.D.   On: 03/02/2014 15:51    Scheduled Meds: . carvedilol  3.125 mg Oral BID WC  . enoxaparin (LOVENOX) injection  60 mg Subcutaneous Q24H  . piperacillin-tazobactam (ZOSYN)  IV  3.375 g Intravenous 3 times per day  . silver sulfADIAZINE   Topical Daily  . vancomycin  1,750 mg Intravenous Q12H   Continuous Infusions:    Principal Problem:   Foot infection Active Problems:   Cellulitis of left leg and foot   Osteomyelitis left first metatarsal   Foot ulcer, left   Tobacco abuse   history of Hypertension   Obesity   Venous stasis ulcer      Lala Lund K M.D on 03/05/2014 at 9:36 AM  Between 7am to 7pm - Pager - [phone], After 7pm go to [email or web address] - password [email or web address]  And look for the night coverage person covering me after hours  [facility]  Office  [phone]

## 2014-03-06 MED ORDER — SILVER SULFADIAZINE 1 % EX CREA: TOPICAL_CREAM | Freq: Every day | CUTANEOUS | Status: DC

## 2014-03-06 MED ORDER — HYDROCODONE-ACETAMINOPHEN 5-325 MG PO TABS: 1.0000 | ORAL_TABLET | ORAL | Status: DC | PRN

## 2014-03-06 MED ORDER — DOXYCYCLINE HYCLATE 100 MG PO CAPS: 100.0000 mg | ORAL_CAPSULE | Freq: Two times a day (BID) | ORAL | Status: DC

## 2014-03-06 MED ORDER — CARVEDILOL 3.125 MG PO TABS: 3.1250 mg | ORAL_TABLET | Freq: Two times a day (BID) | ORAL | Status: DC

## 2014-03-06 NOTE — Discharge Summary (Signed)
Physician Discharge Summary  Juan Kline DVV:[id number] DOB: 1970-06-03 DOA: 03/02/2014  PCP: No PCP Per Patient  Admit date: 03/02/2014 Discharge date: 03/06/2014  Time spent: 60 minutes  Recommendations for Outpatient Follow-up:  1. Please follow patient for potential DM2, HTN, and Left foot wound.  2. Patient will be followed with home health  Discharge Diagnoses:  Principal Problem:   Foot infection Active Problems:   Cellulitis of left leg and foot   Osteomyelitis left first metatarsal   Foot ulcer, left   Tobacco abuse   history of Hypertension   Obesity   Venous stasis ulcer   Discharge Condition: Stable  Diet recommendation: Low sodium/ heart healthy   Filed Weights   03/02/14 1336 03/02/14 2113  Weight: 135.172 kg (298 lb) 132.133 kg (291 lb 4.8 oz)    History of present illness:  Patient is a 43 year old male with a past medical history significant for untreated HTN, and tobacco use. He has not seen a physician since 2009 when he was diagnosed with HTN. He presented to urgent care after being urged by his mother with left leg swelling and redness for the past few weeks. He states that he must have hit his leg on something but cannot remember what. It began swelling and became red. 5 pairs of socks were removed from the right foot, containing dirt and gravel. 10 pairs of socks removed from the left foot, had to be cut off and had eroded into the medial foot, with exposed bone. He stated that he had not removed in 4-6 months. Denied history of diabetes. Plain film showed osteomyelitis of first metatarsal. Dr. Sharol Kline consulted.   Hospital Course:  Patient was admitted to the hospital and IV vancomycin and zosyn were started along with analgesics. Wound cultures were obtained and grew small amount of Proteus Mirabilis which was susceptible to all tested ABX. Continued IV vancomycin and zosyn. Patient was seen by PT wound care and followed by Dr. Sharol Kline. Per Dr. Sharol Kline patients  leg will try to be salvaged but the patient was informed that an amputation may be needed. PT wound care recommended wound care 6x per week. Patient continued to be afebrile and pain continued to be well controled. Patient transitioned to PO Doxy after accomplishing 3-4 days of IV Vanc and Zosyn. He is to be followed with home health as well as with the Wound Clinic and Dr. Sharol Kline.     Ulcer and Exposed Bone of First Metatarsal of Left Leg:  -Likely a complication of inadequate self care, and acute injury.  - Received wound care inpatient per Dr. Sharol Kline- wanted to try and salvage leg.  - Per Juan Kline: IV Vanc and Zosyn for 3- 4 days then PO ABX x 2 weeks with close outpatient follow up with Dr. Sharol Kline and wound clinic -Patient transitioned onto PO doxy -Appointments with Dr. Sharol Kline and [facility] scheduled for 11/24  Osteomyelitis of Left First Metatarsal: - DG foot- Osteomyelitis of the medial cuneiform and base of the first metatarsal done destruction.  - Dr. Sharol Kline following - See above.    Hypertension: - Chronic. Uncontrolled since diagnosed in 2009.  - BP max inpatient 163/105 - Started on Coreg and PRN Hydralazine  - Continued Coreg for outpatient  - [facility] appointment on 11/25 for follow up   Venous Stasis Insufficiency With Cellulitis and Dermatitis of Left Leg: - Secondary to poor self care and HTN - Dr. Sharol Kline following - Supportive care  Hyperglycemia: - No previous labs to compare.  - A1C 6.3 - Carb modified diet  - follow with PCP   Tobacco Abuse: - Patient counseled on cessation  - Patient denied Nicoderm patch.     Procedures:  None  Consultations:  Ortho  Discharge Exam: Filed Vitals:   03/06/14 0617  BP: 161/99  Pulse: 69  Temp: 98.3 F (36.8 C)  Resp: 18   General: Unkept male resting comfortably in bed, NAD, appears stated age  9: EOMI, Anicteic Sclera, MMM. Poor dentition.  Cardiovascular: RRR, S1 S2 auscultated, no  rubs, murmurs or gallops.  Respiratory: Clear to auscultation bilaterally with equal chest rise, normal inspiratory effort   Abdomen: Soft, nontender, nondistended, + bowel sounds  Extremities: Bilateral Lower legs with stasis changes. Left leg dressing dry and intact. Right foot with skin and dirt build up.  Neuro: AAOx3, cranial nerves grossly intact. Able to move both legs easily.  Psych: Normal affect and demeanor with intact judgement and insight    Discharge Instructions    Diet - low sodium heart healthy    Complete by:  As directed      Discharge instructions    Complete by:  As directed   Follow with Primary MD No PCP Per Patient in 7 days , Keep your left foot clean and dry at all times.  Get CBC, CMP, 2 view Chest X ray checked  by Primary MD next visit.    Activity: As tolerated with Full fall precautions use walker/cane & assistance as needed   Disposition Home     Diet: Heart Healthy Low carb  For Heart failure patients - Check your Weight same time everyday, if you gain over 2 pounds, or you develop in leg swelling, experience more shortness of breath or chest pain, call your Primary MD immediately. Follow Cardiac Low Salt Diet and 1.8 lit/day fluid restriction.   On your next visit with your primary care physician please Get Medicines reviewed and adjusted.   Please request your Prim.MD to go over all Hospital Tests and Procedure/Radiological results at the follow up, please get all Hospital records sent to your Prim MD by signing hospital release before you go home.   If you experience worsening of your admission symptoms, develop shortness of breath, life threatening emergency, suicidal or homicidal thoughts you must seek medical attention immediately by calling 911 or calling your MD immediately  if symptoms less severe.  You Must read complete instructions/literature along with all the possible adverse reactions/side effects for all the Medicines you  take and that have been prescribed to you. Take any new Medicines after you have completely understood and accpet all the possible adverse reactions/side effects.   Do not drive, operating heavy machinery, perform activities at heights, swimming or participation in water activities or provide baby sitting services if your were admitted for syncope or siezures until you have seen by Primary MD or a Neurologist and advised to do so again.  Do not drive when taking Pain medications.    Do not take more than prescribed Pain, Sleep and Anxiety Medications  Special Instructions: If you have smoked or chewed Tobacco  in the last 2 yrs please stop smoking, stop any regular Alcohol  and or any Recreational drug use.  Wear Seat belts while driving.   Please note  You were cared for by a hospitalist during your hospital stay. If you have any questions about your discharge medications or the care you received while you  were in the hospital after you are discharged, you can call the unit and asked to speak with the hospitalist on call if the hospitalist that took care of you is not available. Once you are discharged, your primary care physician will handle any further medical issues. Please note that NO REFILLS for any discharge medications will be authorized once you are discharged, as it is imperative that you return to your primary care physician (or establish a relationship with a primary care physician if you do not have one) for your aftercare needs so that they can reassess your need for medications and monitor your lab values.     Increase activity slowly    Complete by:  As directed           Current Discharge Medication List    START taking these medications   Details  carvedilol (COREG) 3.125 MG tablet Take 1 tablet (3.125 mg total) by mouth 2 (two) times daily with a meal. Qty: 60 tablet, Refills: 0    doxycycline (VIBRAMYCIN) 100 MG capsule Take 1 capsule (100 mg total) by mouth 2 (two)  times daily. Qty: 28 capsule, Refills: 0    HYDROcodone-acetaminophen (NORCO/VICODIN) 5-325 MG per tablet Take 1-2 tablets by mouth every 4 (four) hours as needed for moderate pain. Qty: 30 tablet, Refills: 0    silver sulfADIAZINE (SILVADENE) 1 % cream Apply topically daily. Qty: 50 g, Refills: 0      STOP taking these medications     naproxen sodium (ANAPROX) 220 MG tablet        No Known Allergies Follow-up Information    Follow up with Newt Minion, MD On 03/07/2014.   Specialty:  Orthopedic Surgery   Why:  Appointment is at 3:30 on 03/07/2014. $250 deposit due at arrival    Contact information:   [place] [address] [phone]       Follow up with [facility]     On 03/08/2014.   Why:  9 am for hospital follow up   Contact information:   [address] [place] [address] [phone]      Follow up with [facility]              On 03/07/2014.   Why:  1:30 for wound follow up   Contact information:   [address] [address] [phone]       The results of significant diagnostics from this hospitalization (including imaging, microbiology, ancillary and laboratory) are listed below for reference.    Significant Diagnostic Studies: Dg Tibia/fibula Left  03/02/2014   CLINICAL DATA:  Swelling and redness and soft tissue ulcerations with cellulitis in the lower leg.  EXAM: LEFT TIBIA AND FIBULA - 2 VIEW  COMPARISON:  None.  FINDINGS: There is no fracture or dislocation or other acute osseous abnormality. The patient has extensive soft tissue calcifications in the lower leg. The possibility of lupus or dermatomyositis should be considered.  There is also periosteal reaction along the distal fibula.  IMPRESSION: No acute osseous abnormality. Extensive soft tissue calcifications which can be seen with collagen vascular  diseases and renal failure.   Electronically Signed   By: Rozetta Nunnery M.D.   On: 03/02/2014 15:45   Dg Ankle Complete Left  03/02/2014   CLINICAL DATA:  Soft tissue ulcerations and infection.  EXAM: LEFT ANKLE COMPLETE - 3+ VIEW  COMPARISON:  None.  FINDINGS: Again noted are extensive soft tissue calcifications in the distal lower leg and foot. There is slight periosteal reaction along the distal tibia and fibula. The bones of the ankle are normal. No appreciable joint effusion. There is diffuse soft tissue swelling.  IMPRESSION: Soft tissue calcifications and periosteal reaction are nonspecific but can be seen with autoimmune diseases such as lupus or dermatomyositis.   Electronically Signed   By: Rozetta Nunnery M.D.   On: 03/02/2014 15:48   Dg Foot Complete Left  03/02/2014   CLINICAL DATA:  Cellulitis and soft tissue ulcerations.  EXAM: LEFT FOOT - COMPLETE 3+ VIEW  COMPARISON:  None.  FINDINGS: There is a deep ulceration on the medial aspect of the foot at the level of the first tarsal metatarsal joint. There is destruction of the base of the first metatarsal as well as of the distal medial cuneiform. There is periosteal reaction along the shaft of the first metatarsal. The other bones of the foot appear normal. Again noted is diffuse soft tissue swelling as well as soft tissue calcifications and periosteal reaction along the distal fibula.  IMPRESSION: Osteomyelitis of the medial cuneiform and base of the first metatarsal bone destruction.   Electronically Signed   By: Rozetta Nunnery M.D.   On: 03/02/2014 15:51    Microbiology: Recent Results (from the past 240 hour(s))  Wound culture     Status: None   Collection Time: 03/02/14  1:26 PM  Result Value Ref Range Status   Specimen Description WOUND LEFT FOOT  Final   Special Requests NONE  Final   Gram Stain   Final    NO WBC SEEN NO SQUAMOUS EPITHELIAL CELLS SEEN FEW GRAM NEGATIVE RODS RARE GRAM POSITIVE COCCI IN PAIRS    Culture   Final     FEW PROTEUS MIRABILIS Performed at [facility]    Report Status 03/04/2014 FINAL  Final   Organism ID, Bacteria PROTEUS MIRABILIS  Final      Susceptibility   Proteus mirabilis - MIC*    AMPICILLIN <=2 SENSITIVE Sensitive     AMPICILLIN/SULBACTAM <=2 SENSITIVE Sensitive     CEFAZOLIN <=4 SENSITIVE Sensitive     CEFEPIME <=1 SENSITIVE Sensitive     CEFTAZIDIME <=1 SENSITIVE Sensitive     CEFTRIAXONE <=1 SENSITIVE Sensitive     CIPROFLOXACIN <=0.25 SENSITIVE Sensitive     GENTAMICIN <=1 SENSITIVE Sensitive     IMIPENEM 1 SENSITIVE Sensitive     PIP/TAZO <=4 SENSITIVE Sensitive     TOBRAMYCIN <=1 SENSITIVE Sensitive     TRIMETH/SULFA <=20 SENSITIVE Sensitive     * FEW PROTEUS MIRABILIS     Labs: Basic Metabolic Panel:  Recent Labs Lab 03/02/14 1448 03/03/14 1520  NA 132* 136*  K 5.0 3.9  CL 95* 96  CO2  --  27  GLUCOSE 111* 142*  BUN 29* 15  CREATININE 0.80 0.76  CALCIUM  --  8.8  PHOS  --  3.9   Liver Function Tests:  Recent Labs Lab 03/03/14 1520  ALBUMIN 2.2*   CBC:  Recent Labs Lab 03/02/14 1430 03/02/14 1448  WBC 7.5  --   NEUTROABS 5.8  --   HGB 15.5 17.7*  HCT 46.3 52.0  MCV 91.5  --   PLT 272  --      Signed:  SINGH,PRASHANT K PA-S [facility] 03/06/2014, 11:31 AM   Thurnell Lose M.D on 03/06/2014 at 11:31 AM  Between 7am to 7pm - Pager -  [phone], After 7pm go to [email or web address] - password [email or web address]  And look for the night coverage person covering me after hours  [facility]  Office  [phone]

## 2014-03-06 NOTE — Plan of Care (Signed)
Problem: Phase II Progression Outcomes Goal: Discharge plan established Outcome: Completed/Met Date Met:  03/06/14     

## 2014-03-06 NOTE — Progress Notes (Signed)
Nsg Discharge Note  Admit Date:  03/02/2014 Discharge date: 03/06/2014   Seymour Bars to be D/C'd Home with home health per MD order.  AVS completed.  Copy for chart, and copy for patient signed, and dated. Patient/caregiver able to verbalize understanding.  Discharge Medication:   Medication List    STOP taking these medications        naproxen sodium 220 MG tablet  Commonly known as:  ANAPROX      TAKE these medications        carvedilol 3.125 MG tablet  Commonly known as:  COREG  Take 1 tablet (3.125 mg total) by mouth 2 (two) times daily with a meal.     doxycycline 100 MG capsule  Commonly known as:  VIBRAMYCIN  Take 1 capsule (100 mg total) by mouth 2 (two) times daily.     HYDROcodone-acetaminophen 5-325 MG per tablet  Commonly known as:  NORCO/VICODIN  Take 1-2 tablets by mouth every 4 (four) hours as needed for moderate pain.     silver sulfADIAZINE 1 % cream  Commonly known as:  SILVADENE  Apply topically daily.        Discharge Assessment: Filed Vitals:   03/06/14 0617  BP: 161/99  Pulse: 69  Temp: 98.3 F (36.8 C)  Resp: 18   Skin clean, dry and intact without evidence of skin break down, no evidence of skin tears noted. IV catheter discontinued intact. Site without signs and symptoms of complications - no redness or edema noted at insertion site, patient denies c/o pain - only slight tenderness at site.  Dressing with slight pressure applied.  D/c Instructions-Education: Discharge instructions given to patient/family with verbalized understanding. D/c education completed with patient/family including follow up instructions, medication list, d/c activities limitations if indicated, with other d/c instructions as indicated by MD - patient able to verbalize understanding, all questions fully answered. Patient instructed to return to ED, call 911, or call MD for any changes in condition.  Patient escorted via [place], and D/C home via private auto.  Dayle Points, RN 03/06/2014 12:50 PM

## 2014-03-06 NOTE — Progress Notes (Signed)
Physical Therapy Wound Treatment Patient Details  Name: Juan Kline MRN: [id number] Date of Birth: 13-Aug-1970  Today's Date: 03/06/2014 Time: 9833-8250 Time Calculation (min): 49 min  Subjective  Patient and Family Stated Goals: pt agrees he wants to save his foot. Date of Onset:  (?4-6 months) Prior Treatments:  (pt wore multiple socks on foot x 4-6 months)  Pain Score:   0 throughout session  Wound Assessment  Clinical Statement: Foot wound with significant epibole at wound edges that will slow wound healing (may need surgical debridement to promote healing). Used forceps and 4x4 to attempt to remove lead edge of epithelial cells. Yellow eschar softened and more easily removed (although biofilm reaccumulating over foot wound and easily removed)  Wound / Incision (Open or Dehisced) 03/02/14 Other (Comment) Foot Left See comment above. (Cont.) Patient has brown and white flakes surrounding foot. Patient's foot pale until mid ankle and erythema starts. UCC reports patient had ten socks removed from (Active)  Dressing Type Compression wrap;Gauze (Comment);Non adherent;ABD;Silver dressings 03/06/2014  9:24 AM  Dressing Changed Changed 03/06/2014  9:24 AM  Dressing Status Clean;Dry;Intact 03/06/2014  9:24 AM  Dressing Change Frequency Daily 03/06/2014  9:24 AM  Site / Wound Assessment Yellow;Pink;Red 03/06/2014  9:24 AM  % Wound base Red or Granulating 85% 03/06/2014  9:24 AM  % Wound base Yellow 15% 03/06/2014  9:24 AM  Peri-wound Assessment Intact;Erythema (blanchable) 03/06/2014  9:24 AM  Wound Length (cm) 11 cm 03/03/2014 11:00 AM  Wound Width (cm) 2 cm 03/03/2014 11:00 AM  Wound Depth (cm) 2 cm 03/03/2014 11:00 AM  Margins Epibole (rolled edges) 03/06/2014  9:24 AM  Closure None 03/06/2014  9:24 AM  Drainage Amount Minimal 03/06/2014  9:24 AM  Drainage Description Serosanguineous 03/06/2014  9:24 AM  Non-staged Wound Description Full thickness 03/06/2014  9:24 AM  Treatment  Debridement (Selective);Hydrotherapy (Pulse lavage) 03/06/2014  9:24 AM     Wound / Incision (Open or Dehisced) 03/04/14 left lower anterior leg (Active)  Dressing Type Compression wrap;Non adherent;Silver dressings;Gauze (Comment) 03/06/2014  9:24 AM  Dressing Changed Changed 03/06/2014  9:24 AM  Dressing Status Clean;Dry;Intact 03/06/2014  9:24 AM  Dressing Change Frequency Daily 03/06/2014  9:24 AM  Site / Wound Assessment Yellow;Red 03/06/2014  9:24 AM  % Wound base Red or Granulating 40% 03/06/2014  9:24 AM  % Wound base Yellow 60% 03/06/2014  9:24 AM  Peri-wound Assessment Edema;Erythema (blanchable);Pink 03/06/2014  9:24 AM  Wound Length (cm) 2.7 cm 03/06/2014  9:24 AM  Wound Width (cm) 6.5 cm 03/06/2014  9:24 AM  Wound Depth (cm) 0 cm 03/06/2014  9:24 AM  Margins Unattached edges (unapproximated) 03/06/2014  9:24 AM  Closure None 03/06/2014  9:24 AM  Drainage Amount Scant 03/06/2014  9:24 AM  Drainage Description Serosanguineous 03/06/2014  9:24 AM  Non-staged Wound Description Full thickness 03/06/2014  9:24 AM  Treatment Debridement (Selective);Hydrotherapy (Pulse lavage) 03/06/2014  9:24 AM   Hydrotherapy Pulsed lavage therapy - wound location: anterior shin wound; foot wound Pulsed Lavage with Suction (psi): 8 psi Pulsed Lavage with Suction - Normal Saline Used: 1000 mL Pulsed Lavage Tip: Tip with splash shield Selective Debridement Selective Debridement - Location: Lt anterior shin wound, Lt medial foot Selective Debridement - Tools Used: Forceps;Scissors (scrubbing with 4x4) Selective Debridement - Tissue Removed: yellow slough and eschar   Wound Assessment and Plan  Wound Therapy - Assess/Plan/Recommendations Wound Therapy - Clinical Statement: Foot wound with significant epibole at wound edges that will slow wound healing. Used forceps  and 4x4 to attempt to remove lead edge of epithelial cells. Yellow eschar softened and more easily removed (although biofilm  reaccumulating over foot wound and easily removed) Wound Therapy - Functional Problem List: pt with incr risk of continued infections due to impaired integumentary system Factors Delaying/Impairing Wound Healing: Altered sensation;Infection - systemic/local;Multiple medical problems;Substance abuse Hydrotherapy Plan: Debridement;Dressing change;Patient/family education;Pulsatile lavage with suction Wound Therapy - Frequency: 6X / week Wound Therapy - Current Recommendations: Case manager/social work Wound Therapy - Follow Up Recommendations: [place] Wound Plan: see above  Wound Therapy Goals- Improve the function of patient's integumentary system by progressing the wound(s) through the phases of wound healing (inflammation - proliferation - remodeling) by: Decrease Necrotic Tissue to: 5% Decrease Necrotic Tissue - Progress: Progressing toward goal Increase Granulation Tissue to: 95% Increase Granulation Tissue - Progress: Progressing toward goal Improve Drainage Characteristics: Min;Serous Improve Drainage Characteristics - Progress: Progressing toward goal Additional Wound Therapy Goal: prepare for wound vac Additional Wound Therapy Goal - Progress: Progressing toward goal Goals/treatment plan/discharge plan were made with and agreed upon by patient/family: Yes  Goals will be updated until maximal potential achieved or discharge criteria met.  Discharge criteria: when goals achieved, discharge from hospital, MD decision/surgical intervention, no progress towards goals, refusal/missing three consecutive treatments without notification or medical reason.  GP     Jacquette Canales 03/06/2014, 9:30 AM Pager [phone]

## 2014-03-06 NOTE — Discharge Instructions (Signed)
Follow with Primary MD No PCP Per Patient in 7 days , Keep your left foot clean and dry at all times.  Get CBC, CMP, 2 view Chest X ray checked  by Primary MD next visit.    Activity: As tolerated with Full fall precautions use walker/cane & assistance as needed   Disposition Home     Diet: Heart Healthy Low carb  For Heart failure patients - Check your Weight same time everyday, if you gain over 2 pounds, or you develop in leg swelling, experience more shortness of breath or chest pain, call your Primary MD immediately. Follow Cardiac Low Salt Diet and 1.8 lit/day fluid restriction.   On your next visit with your primary care physician please Get Medicines reviewed and adjusted.   Please request your Prim.MD to go over all Hospital Tests and Procedure/Radiological results at the follow up, please get all Hospital records sent to your Prim MD by signing hospital release before you go home.   If you experience worsening of your admission symptoms, develop shortness of breath, life threatening emergency, suicidal or homicidal thoughts you must seek medical attention immediately by calling 911 or calling your MD immediately  if symptoms less severe.  You Must read complete instructions/literature along with all the possible adverse reactions/side effects for all the Medicines you take and that have been prescribed to you. Take any new Medicines after you have completely understood and accpet all the possible adverse reactions/side effects.   Do not drive, operating heavy machinery, perform activities at heights, swimming or participation in water activities or provide baby sitting services if your were admitted for syncope or siezures until you have seen by Primary MD or a Neurologist and advised to do so again.  Do not drive when taking Pain medications.    Do not take more than prescribed Pain, Sleep and Anxiety Medications  Special Instructions: If you have smoked or chewed Tobacco   in the last 2 yrs please stop smoking, stop any regular Alcohol  and or any Recreational drug use.  Wear Seat belts while driving.   Please note  You were cared for by a hospitalist during your hospital stay. If you have any questions about your discharge medications or the care you received while you were in the hospital after you are discharged, you can call the unit and asked to speak with the hospitalist on call if the hospitalist that took care of you is not available. Once you are discharged, your primary care physician will handle any further medical issues. Please note that NO REFILLS for any discharge medications will be authorized once you are discharged, as it is imperative that you return to your primary care physician (or establish a relationship with a primary care physician if you do not have one) for your aftercare needs so that they can reassess your need for medications and monitor your lab values.

## 2014-03-07 ENCOUNTER — Encounter (HOSPITAL_BASED_OUTPATIENT_CLINIC_OR_DEPARTMENT_OTHER): Payer: Self-pay | Attending: General Surgery

## 2014-03-07 DIAGNOSIS — L97929 Non-pressure chronic ulcer of unspecified part of left lower leg with unspecified severity: Secondary | ICD-10-CM | POA: Insufficient documentation

## 2014-03-07 DIAGNOSIS — I87332 Chronic venous hypertension (idiopathic) with ulcer and inflammation of left lower extremity: Secondary | ICD-10-CM | POA: Insufficient documentation

## 2014-03-07 LAB — GLUCOSE, CAPILLARY: Glucose-Capillary: 145 mg/dL — ABNORMAL HIGH (ref 70–99)

## 2014-03-07 NOTE — H&P (Signed)
NAMEWOODFIN, KISS NO.:  [id number]  MEDICAL RECORD NO.:  [id number]  LOCATION:  FOOT                         FACILITY:  [facility]  PHYSICIAN:  Elesa Hacker, M.D.        DATE OF BIRTH:  [birth year]  DATE OF ADMISSION:  03/07/2014 DATE OF DISCHARGE:                             HISTORY & PHYSICAL   CHIEF COMPLAINT:  Wounds in left leg and foot.  HISTORY OF PRESENT ILLNESS:  The patient was admitted to the hospital several weeks ago with a bizarre story of not changing socks or clothes for several weeks.  At that time, he had a sizable wound on his foreleg on the left side and a sizable wound in his left foot.  The wound in the left foot proved to easily probe to the base of the first metatarsal and cuneiform and x-rays revealed osteomyelitis.  The wound on the leg was a great deal less deep.  PAST MEDICAL HISTORY:  Significant for hypertension, obesity, tobacco abuse, venous stasis, and borderline diabetes.  SURGERY:  None.  SOCIAL HISTORY:  Cigarettes, more than a pack a day although the patient says he is now going to quit. Alcohol occasionally.  ALLERGIES:  None.  MEDICATIONS:  Doxycycline, carvedilol, hydrocodone, and Silvadene cream.  REVIEW OF SYSTEMS:  As above.  PHYSICAL EXAMINATION:  VITAL SIGNS:  Temperature 98.4, pulse 79, respirations 18, blood pressure 168/97, capillary glucose is 145. CHEST:  Clear. HEART:  Regular rhythm. EXTREMITIES:  Examination the left lower extremity reveals a sizable wound on the foot at the base of the first metatarsal.  This probes down to easily 2.3 cm and easily into the joint space.  The wound is relatively long 11.5 cm and narrow 1.5 cm.  Amidst an area on the anterior leg, there is a 3.0 x 5.0 x 0.1 ulceration and clear-cut stasis dermatitis.  This wound is not very tender and not very deep.  IMPRESSION:  Traumatic wound to the left foot in a borderline diabetic patient with clear-cut osteomyelitis, a more  superficial chronic venous hypertension with ulceration of the anterior foreleg.  PLAN OF TREATMENT:  The patient will see Dr. Sharol Given today.  If he decides not to proceed with any surgery on the foot. we will begin treating probably with Santyl, at least start.  I do not want to put much compression on until we decide what is happening with the foot, but I have recommended a great deal of elevation to treat the stasis ulcer and we will start treating with silver alginate.  I will see him in 7 days or if Dr. Sharol Given proceed with surgery,  I will see him whenever Dr. Sharol Given requests.     Elesa Hacker, M.D.     RA/MEDQ  D:  03/07/2014  T:  03/07/2014  Job:  [id number]  cc:   Newt Minion, MD

## 2014-03-08 ENCOUNTER — Encounter: Payer: Self-pay | Admitting: Family Medicine

## 2014-03-08 ENCOUNTER — Ambulatory Visit: Payer: Self-pay | Attending: Family Medicine | Admitting: Family Medicine

## 2014-03-08 VITALS — BP 151/103 | HR 75 | Temp 98.6°F | Resp 16 | Ht 70.0 in | Wt 292.0 lb

## 2014-03-08 DIAGNOSIS — I10 Essential (primary) hypertension: Secondary | ICD-10-CM

## 2014-03-08 DIAGNOSIS — Z418 Encounter for other procedures for purposes other than remedying health state: Secondary | ICD-10-CM

## 2014-03-08 DIAGNOSIS — Z299 Encounter for prophylactic measures, unspecified: Secondary | ICD-10-CM

## 2014-03-08 DIAGNOSIS — M869 Osteomyelitis, unspecified: Secondary | ICD-10-CM

## 2014-03-08 LAB — GLUCOSE, POCT (MANUAL RESULT ENTRY): POC Glucose: 141 mg/dl — AB (ref 70–99)

## 2014-03-08 MED ORDER — LOSARTAN POTASSIUM 50 MG PO TABS: 50.0000 mg | ORAL_TABLET | Freq: Every day | ORAL | Status: DC

## 2014-03-08 NOTE — Assessment & Plan Note (Signed)
A: BP still elevated Meds: compliant with coreg P: Add losartan 50 mg daily  F/u in 3 weeks

## 2014-03-08 NOTE — Progress Notes (Signed)
Establish Care HFU wound on lt foot Pt stated no feeling on lt foot Pt took blood pressure medication about 8 this morning

## 2014-03-08 NOTE — Patient Instructions (Signed)
[name],  Thank you for coming in today. It was a pleasure meeting you. I look forward to being your primary doctor.   1. HTN: add losartan 50 mg after breakfast. Continue coreg. Low salt diet.   2. High blood sugar: low carb diet.   3. L foot and leg infection: finish doxycycline.   F/u in 3 weeks for HTN follow up  Dr. Adrian Blackwater

## 2014-03-08 NOTE — Progress Notes (Signed)
   Subjective:    Patient ID: Juan Kline, male    DOB: [birth year], 43 y.o.   MRN: [id number] CC: establish care, L first metatarsal osteomyelitis, L foot cellulitis, L foot ulcer   HPI 43 yo M presents to establish care:  1.  L first metatarsal osteomyelitis, L foot cellulitis, L foot ulcer:    2. HTN: taking coreg. Denies fatigue, HA, vision changes, CP, SOB. Leg swelling has improved.   Soc Hx: smoker  Med Hx: HTN 2009 Surg Hx: negative  Review of Systems As per HPI      Objective:   Physical Exam BP 151/103 mmHg  Pulse 75  Temp(Src) 98.6 F (37 C) (Oral)  Resp 16  Ht 5\' 10"  (1.778 m)  Wt 292 lb (132.45 kg)  BMI 41.90 kg/m2  SpO2 96% General appearance: alert, cooperative and no distress Chest wall: no tenderness Heart: regular rate and rhythm, S1, S2 normal, no murmur, click, rub or gallop Extremities:  R leg with hemosiderin changes, no edema. L leg wrapped, non tender   Lab Results  Component Value Date   HGBA1C 6.3* 03/03/2014   CBG: 141    Assessment & Plan:

## 2014-03-08 NOTE — Assessment & Plan Note (Signed)
A: L foot osteo. On doxycycline. Following closely with Dr. Sharol Given (ortho). No wound care at the moment P:  Finish doxy F/u with Dr. Sharol Given.

## 2014-03-14 ENCOUNTER — Encounter (HOSPITAL_BASED_OUTPATIENT_CLINIC_OR_DEPARTMENT_OTHER): Payer: Self-pay | Attending: General Surgery

## 2014-03-20 ENCOUNTER — Telehealth: Payer: Self-pay | Admitting: Family Medicine

## 2014-03-20 NOTE — Telephone Encounter (Signed)
Patient called in wondering if he should continue doxycycline for L foot osteomyelitis. He is out of doxy and silavedene. He has a f/u appt with Dr. Sharol Given today at 3:30 PM. Plan to get recommendations from Dr. Sharol Given, if recommend patient can get doxy refill here. Gave patient the fax # to have recommendations faxed here.

## 2014-03-31 ENCOUNTER — Ambulatory Visit: Payer: Self-pay | Attending: Family Medicine

## 2014-04-29 ENCOUNTER — Other Ambulatory Visit: Payer: Self-pay | Admitting: Internal Medicine

## 2014-05-19 ENCOUNTER — Telehealth: Payer: Self-pay | Admitting: Family Medicine

## 2014-05-19 ENCOUNTER — Ambulatory Visit: Payer: Self-pay | Attending: Family Medicine | Admitting: Family Medicine

## 2014-05-19 ENCOUNTER — Encounter: Payer: Self-pay | Admitting: Family Medicine

## 2014-05-19 VITALS — BP 145/93 | HR 79 | Temp 98.0°F | Resp 16 | Ht 69.5 in | Wt 278.8 lb

## 2014-05-19 DIAGNOSIS — I8312 Varicose veins of left lower extremity with inflammation: Secondary | ICD-10-CM

## 2014-05-19 DIAGNOSIS — R51 Headache: Secondary | ICD-10-CM

## 2014-05-19 DIAGNOSIS — I872 Venous insufficiency (chronic) (peripheral): Secondary | ICD-10-CM | POA: Insufficient documentation

## 2014-05-19 DIAGNOSIS — R519 Headache, unspecified: Secondary | ICD-10-CM

## 2014-05-19 DIAGNOSIS — L308 Other specified dermatitis: Secondary | ICD-10-CM | POA: Insufficient documentation

## 2014-05-19 DIAGNOSIS — E785 Hyperlipidemia, unspecified: Secondary | ICD-10-CM

## 2014-05-19 DIAGNOSIS — R7309 Other abnormal glucose: Secondary | ICD-10-CM

## 2014-05-19 DIAGNOSIS — R202 Paresthesia of skin: Secondary | ICD-10-CM

## 2014-05-19 DIAGNOSIS — R7303 Prediabetes: Secondary | ICD-10-CM

## 2014-05-19 DIAGNOSIS — Z87891 Personal history of nicotine dependence: Secondary | ICD-10-CM | POA: Insufficient documentation

## 2014-05-19 DIAGNOSIS — E559 Vitamin D deficiency, unspecified: Secondary | ICD-10-CM

## 2014-05-19 DIAGNOSIS — I8311 Varicose veins of right lower extremity with inflammation: Secondary | ICD-10-CM

## 2014-05-19 DIAGNOSIS — Z6841 Body Mass Index (BMI) 40.0 and over, adult: Secondary | ICD-10-CM | POA: Insufficient documentation

## 2014-05-19 DIAGNOSIS — R52 Pain, unspecified: Secondary | ICD-10-CM | POA: Insufficient documentation

## 2014-05-19 DIAGNOSIS — I10 Essential (primary) hypertension: Secondary | ICD-10-CM | POA: Insufficient documentation

## 2014-05-19 DIAGNOSIS — M869 Osteomyelitis, unspecified: Secondary | ICD-10-CM | POA: Insufficient documentation

## 2014-05-19 DIAGNOSIS — R200 Anesthesia of skin: Secondary | ICD-10-CM | POA: Insufficient documentation

## 2014-05-19 DIAGNOSIS — G8929 Other chronic pain: Secondary | ICD-10-CM | POA: Insufficient documentation

## 2014-05-19 LAB — COMPLETE METABOLIC PANEL WITH GFR
ALT: 17 U/L (ref 0–53)
AST: 18 U/L (ref 0–37)
Albumin: 4.5 g/dL (ref 3.5–5.2)
Alkaline Phosphatase: 53 U/L (ref 39–117)
BUN: 14 mg/dL (ref 6–23)
CO2: 32 mEq/L (ref 19–32)
CREATININE: 0.66 mg/dL (ref 0.50–1.35)
Calcium: 9.9 mg/dL (ref 8.4–10.5)
Chloride: 97 mEq/L (ref 96–112)
GFR, Est African American: >89 mL/min
GFR, Est Non African American: >89 mL/min
GLUCOSE: 88 mg/dL (ref 70–99)
POTASSIUM: 4.7 meq/L (ref 3.5–5.3)
SODIUM: 139 meq/L (ref 135–145)
Total Bilirubin: 0.6 mg/dL (ref 0.2–1.2)
Total Protein: 7.6 g/dL (ref 6.0–8.3)

## 2014-05-19 LAB — LIPID PANEL
Cholesterol: 254 mg/dL — ABNORMAL HIGH (ref 0–200)
HDL: 43 mg/dL (ref >39)
LDL CALC: 145 mg/dL — AB (ref 0–99)
Total CHOL/HDL Ratio: 5.9 Ratio
Triglycerides: 328 mg/dL — ABNORMAL HIGH (ref <150)
VLDL: 66 mg/dL — AB (ref 0–40)

## 2014-05-19 LAB — POCT GLYCOSYLATED HEMOGLOBIN (HGB A1C): Hemoglobin A1C: 5.5

## 2014-05-19 LAB — GLUCOSE, POCT (MANUAL RESULT ENTRY): POC Glucose: 98 mg/dl (ref 70–99)

## 2014-05-19 MED ORDER — GABAPENTIN 300 MG PO CAPS: 300.0000 mg | ORAL_CAPSULE | Freq: Every day | ORAL | Status: DC

## 2014-05-19 MED ORDER — LOSARTAN POTASSIUM 100 MG PO TABS: 100.0000 mg | ORAL_TABLET | Freq: Every day | ORAL | Status: DC

## 2014-05-19 NOTE — Assessment & Plan Note (Signed)
2. Foot numbness: Checking on vitamin deficiency- B12 and vit D Start Neurontin 300 mg at night for suspected nerve pain

## 2014-05-19 NOTE — Assessment & Plan Note (Addendum)
HTN: goal < 140/90 BP above goal  Increase losartan to 100 mg daily

## 2014-05-19 NOTE — Assessment & Plan Note (Signed)
  4. L temple pain: normal exam Checking sed rate to rule out inflammatory process along temporal artery

## 2014-05-19 NOTE — Progress Notes (Signed)
Patient here to have his annual exam Patient is fasting Patient needs refill on Losartan Patient has left foot wound that is healing Patient states his left scalp and left face is painful Had flu and PNA shot in hospital

## 2014-05-19 NOTE — Telephone Encounter (Signed)
Patient is calling to inquire about her results. Please follow up with patient.

## 2014-05-19 NOTE — Assessment & Plan Note (Signed)
A: chronic venous stasis related to obesity  P:  Weight loss Elevation

## 2014-05-19 NOTE — Patient Instructions (Addendum)
Mr. Yepiz,   Thank you for coming in today.  1. You do not have diabetes. Blood sugar is normal  A1c form 6.3 to 5.5    2. Foot numbness: Checking on vitamin deficiency- B12 and vit D Start Neurontin 300 mg at night for suspected nerve pain   3. HTN: goal < 140/90 Increase losartan to 100 mg daily   4. L temple pain: normal exam Checking sed rate to rule out inflammatory process along temporal artery   You will be called with lab results   F/u in 3 weeks with RN for BP check  F/u with me in 6 weeks   Dr. Adrian Blackwater

## 2014-05-19 NOTE — Progress Notes (Signed)
   Subjective:    Patient ID: Juan Kline, male    DOB: Jun 16, 1970, 44 y.o.   MRN: [id number] CC: f/u HTN  HPI 44 yo M presents with his mom  1. HTN: taking losartan 50 mg daily. Sometimes has feeling of being out of it or unaware upon standing. Denies dizziness, CP, SOB, lightheadedness.    2. L temple pain: comes and goes. Feeling achy on L side of face. No rash, skin changes, HA, ear pain, fever.   3. Leg pain: has restless feeling legs with numbness in feet. Hx of prediabetes. No pain with activity. No trauma.   Soc Hx: former, heavy smoker, quit  Review of Systems As per HPI      Objective:   Physical Exam BP 145/93 mmHg  Pulse 79  Temp(Src) 98 F (36.7 C)  Resp 16  Ht 5' 9.5" (1.765 m)  Wt 278 lb 12.8 oz (126.463 kg)  BMI 40.60 kg/m2  SpO2 98% General appearance: alert, cooperative, no distress and morbidly obese Head: Normocephalic, without obvious abnormality, atraumatic Eyes: conjunctivae/corneas clear. PERRL, EOM's intact.  Ears: normal TM's and external ear canals both ears Nose: Nares normal. Septum midline. Mucosa normal. No drainage or sinus tenderness. Throat: lips, mucosa, and tongue normal; teeth and gums normal Lungs: clear to auscultation bilaterally Heart: regular rate and rhythm, S1, S2 normal, no murmur, click, rub or gallop Extremities: trace edema, venous stasis dermatitis, L foot ulcer healed  Pulses: 2+ and symmetric  Lab Results  Component Value Date   HGBA1C 5.50 05/19/2014  fasting CBG 98     Assessment & Plan:

## 2014-05-19 NOTE — Assessment & Plan Note (Signed)
Resolved. Wound has healed

## 2014-05-20 LAB — VITAMIN B12: Vitamin B-12: 583 pg/mL (ref 211–911)

## 2014-05-20 LAB — VITAMIN D 25 HYDROXY (VIT D DEFICIENCY, FRACTURES): Vit D, 25-Hydroxy: 23 ng/mL — ABNORMAL LOW (ref 30–100)

## 2014-05-22 ENCOUNTER — Telehealth: Payer: Self-pay | Admitting: *Deleted

## 2014-05-22 DIAGNOSIS — E559 Vitamin D deficiency, unspecified: Secondary | ICD-10-CM | POA: Insufficient documentation

## 2014-05-22 DIAGNOSIS — E7849 Other hyperlipidemia: Secondary | ICD-10-CM | POA: Insufficient documentation

## 2014-05-22 MED ORDER — CALCIUM CITRATE-VITAMIN D 500-400 MG-UNIT PO CHEW: 1.0000 | CHEWABLE_TABLET | Freq: Two times a day (BID) | ORAL | Status: DC

## 2014-05-22 MED ORDER — ATORVASTATIN CALCIUM 20 MG PO TABS: 20.0000 mg | ORAL_TABLET | Freq: Every day | ORAL | Status: DC

## 2014-05-22 NOTE — Assessment & Plan Note (Signed)
A: HLD P: treat with lipitor 20 mg daily

## 2014-05-22 NOTE — Telephone Encounter (Signed)
Pt called requesting lab results

## 2014-05-22 NOTE — Telephone Encounter (Signed)
Called patient with lab test results.  Let him know that Dr. Adrian Blackwater would review the results and we would get back to him with her Plan of care.  Patient verbalized understanding.

## 2014-05-22 NOTE — Assessment & Plan Note (Signed)
A: vit D insuff P: treat with vit D supplement

## 2014-05-22 NOTE — Telephone Encounter (Signed)
LVM to return call.

## 2014-05-22 NOTE — Telephone Encounter (Signed)
-----   Message from Minerva Ends, MD sent at 05/22/2014  5:08 PM EST ----- High cholesterol, given hypertension and smoking history recommend treatment with lipitor 20 mg daily.  Normal B12. Vit D deficiency will treat.  Normal CMP Tube for sed rate and CBC was not done if he still having L face pain I recommend f/u for labs

## 2014-05-23 ENCOUNTER — Telehealth: Payer: Self-pay | Admitting: Family Medicine

## 2014-05-23 NOTE — Telephone Encounter (Signed)
Pt aware of lab results, lab appointment schedule.    Notes Recorded by Minerva Ends, MD on 05/22/2014 at 5:08 PM High cholesterol, given hypertension and smoking history recommend treatment with lipitor 20 mg daily.  Normal B12. Vit D deficiency will treat.  Normal CMP Tube for sed rate and CBC was not done if he still having L face pain I recommend f/u for labs

## 2014-05-24 ENCOUNTER — Ambulatory Visit: Payer: Self-pay | Attending: Family Medicine

## 2014-05-24 DIAGNOSIS — R519 Headache, unspecified: Secondary | ICD-10-CM

## 2014-05-24 DIAGNOSIS — R51 Headache: Principal | ICD-10-CM

## 2014-05-24 LAB — CBC WITH DIFFERENTIAL/PLATELET
BASOS ABS: 0 10*3/uL (ref 0.0–0.1)
Basophils Relative: 0 % (ref 0–1)
EOS ABS: 0.3 10*3/uL (ref 0.0–0.7)
Eosinophils Relative: 4 % (ref 0–5)
HEMATOCRIT: 44 % (ref 39.0–52.0)
Hemoglobin: 15 g/dL (ref 13.0–17.0)
LYMPHS ABS: 3.4 10*3/uL (ref 0.7–4.0)
Lymphocytes Relative: 41 % (ref 12–46)
MCH: 29.9 pg (ref 26.0–34.0)
MCHC: 34.1 g/dL (ref 30.0–36.0)
MCV: 87.8 fL (ref 78.0–100.0)
MONOS PCT: 10 % (ref 3–12)
MPV: 9.2 fL (ref 8.6–12.4)
Monocytes Absolute: 0.8 10*3/uL (ref 0.1–1.0)
Neutro Abs: 3.7 10*3/uL (ref 1.7–7.7)
Neutrophils Relative %: 45 % (ref 43–77)
Platelets: 277 10*3/uL (ref 150–400)
RBC: 5.01 MIL/uL (ref 4.22–5.81)
RDW: 13.8 % (ref 11.5–15.5)
WBC: 8.2 10*3/uL (ref 4.0–10.5)

## 2014-05-25 LAB — SEDIMENTATION RATE: Sed Rate: 28 mm/hr — ABNORMAL HIGH (ref 0–15)

## 2014-05-26 ENCOUNTER — Telehealth: Payer: Self-pay | Admitting: *Deleted

## 2014-05-26 NOTE — Telephone Encounter (Signed)
-----   Message from Minerva Ends, MD sent at 05/25/2014  9:38 AM EST ----- Normal CBC with diff slightly elevated sed rate, but no so high to suggest infection or inflammatory process

## 2014-05-26 NOTE — Telephone Encounter (Signed)
Left voice message with normal labs Return call for more information

## 2014-06-09 ENCOUNTER — Ambulatory Visit: Payer: Self-pay | Attending: Family Medicine | Admitting: *Deleted

## 2014-06-09 VITALS — BP 168/98 | HR 77 | Temp 97.9°F | Resp 16

## 2014-06-09 DIAGNOSIS — I10 Essential (primary) hypertension: Secondary | ICD-10-CM | POA: Insufficient documentation

## 2014-06-09 LAB — BASIC METABOLIC PANEL
BUN: 18 mg/dL (ref 6–23)
CO2: 27 meq/L (ref 19–32)
Calcium: 9.4 mg/dL (ref 8.4–10.5)
Chloride: 102 mEq/L (ref 96–112)
Creat: 0.75 mg/dL (ref 0.50–1.35)
Glucose, Bld: 99 mg/dL (ref 70–99)
POTASSIUM: 5.2 meq/L (ref 3.5–5.3)
SODIUM: 141 meq/L (ref 135–145)

## 2014-06-09 MED ORDER — CHLORTHALIDONE 25 MG PO TABS: 25.0000 mg | ORAL_TABLET | Freq: Every day | ORAL | Status: DC

## 2014-06-09 NOTE — Patient Instructions (Signed)
DASH Eating Plan °DASH stands for "Dietary Approaches to Stop Hypertension." The DASH eating plan is a healthy eating plan that has been shown to reduce high blood pressure (hypertension). Additional health benefits may include reducing the risk of type 2 diabetes mellitus, heart disease, and stroke. The DASH eating plan may also help with weight loss. °WHAT DO I NEED TO KNOW ABOUT THE DASH EATING PLAN? °For the DASH eating plan, you will follow these general guidelines: °· Choose foods with a percent daily value for sodium of less than 5% (as listed on the food label). °· Use salt-free seasonings or herbs instead of table salt or sea salt. °· Check with your health care provider or pharmacist before using salt substitutes. °· Eat lower-sodium products, often labeled as "lower sodium" or "no salt added." °· Eat fresh foods. °· Eat more vegetables, fruits, and low-fat dairy products. °· Choose whole grains. Look for the word "whole" as the first word in the ingredient list. °· Choose fish and skinless chicken or turkey more often than red meat. Limit fish, poultry, and meat to 6 oz (170 g) each day. °· Limit sweets, desserts, sugars, and sugary drinks. °· Choose heart-healthy fats. °· Limit cheese to 1 oz (28 g) per day. °· Eat more home-cooked food and less restaurant, buffet, and fast food. °· Limit fried foods. °· Cook foods using methods other than frying. °· Limit canned vegetables. If you do use them, rinse them well to decrease the sodium. °· When eating at a restaurant, ask that your food be prepared with less salt, or no salt if possible. °WHAT FOODS CAN I EAT? °Seek help from a dietitian for individual calorie needs. °Grains °Whole grain or whole wheat bread. Brown rice. Whole grain or whole wheat pasta. Quinoa, bulgur, and whole grain cereals. Low-sodium cereals. Corn or whole wheat flour tortillas. Whole grain cornbread. Whole grain crackers. Low-sodium crackers. °Vegetables °Fresh or frozen vegetables  (raw, steamed, roasted, or grilled). Low-sodium or reduced-sodium tomato and vegetable juices. Low-sodium or reduced-sodium tomato sauce and paste. Low-sodium or reduced-sodium canned vegetables.  °Fruits °All fresh, canned (in natural juice), or frozen fruits. °Meat and Other Protein Products °Ground beef (85% or leaner), grass-fed beef, or beef trimmed of fat. Skinless chicken or turkey. Ground chicken or turkey. Pork trimmed of fat. All fish and seafood. Eggs. Dried beans, peas, or lentils. Unsalted nuts and seeds. Unsalted canned beans. °Dairy °Low-fat dairy products, such as skim or 1% milk, 2% or reduced-fat cheeses, low-fat ricotta or cottage cheese, or plain low-fat yogurt. Low-sodium or reduced-sodium cheeses. °Fats and Oils °Tub margarines without trans fats. Light or reduced-fat mayonnaise and salad dressings (reduced sodium). Avocado. Safflower, olive, or canola oils. Natural peanut or almond butter. °Other °Unsalted popcorn and pretzels. °The items listed above may not be a complete list of recommended foods or beverages. Contact your dietitian for more options. °WHAT FOODS ARE NOT RECOMMENDED? °Grains °White bread. White pasta. White rice. Refined cornbread. Bagels and croissants. Crackers that contain trans fat. °Vegetables °Creamed or fried vegetables. Vegetables in a cheese sauce. Regular canned vegetables. Regular canned tomato sauce and paste. Regular tomato and vegetable juices. °Fruits °Dried fruits. Canned fruit in light or heavy syrup. Fruit juice. °Meat and Other Protein Products °Fatty cuts of meat. Ribs, chicken wings, bacon, sausage, bologna, salami, chitterlings, fatback, hot dogs, bratwurst, and packaged luncheon meats. Salted nuts and seeds. Canned beans with salt. °Dairy °Whole or 2% milk, cream, half-and-half, and cream cheese. Whole-fat or sweetened yogurt. Full-fat   cheeses or blue cheese. Nondairy creamers and whipped toppings. Processed cheese, cheese spreads, or cheese  curds. °Condiments °Onion and garlic salt, seasoned salt, table salt, and sea salt. Canned and packaged gravies. Worcestershire sauce. Tartar sauce. Barbecue sauce. Teriyaki sauce. Soy sauce, including reduced sodium. Steak sauce. Fish sauce. Oyster sauce. Cocktail sauce. Horseradish. Ketchup and mustard. Meat flavorings and tenderizers. Bouillon cubes. Hot sauce. Tabasco sauce. Marinades. Taco seasonings. Relishes. °Fats and Oils °Butter, stick margarine, lard, shortening, ghee, and bacon fat. Coconut, palm kernel, or palm oils. Regular salad dressings. °Other °Pickles and olives. Salted popcorn and pretzels. °The items listed above may not be a complete list of foods and beverages to avoid. Contact your dietitian for more information. °WHERE CAN I FIND MORE INFORMATION? °National Heart, Lung, and Blood Institute: www.nhlbi.nih.gov/health/health-topics/topics/dash/ °Document Released: 03/20/2011 Document Revised: 08/15/2013 Document Reviewed: 02/02/2013 °Exit[employer]® Patient Information ©2015 [employer]. This information is not intended to replace advice given to you by your health care provider. Make sure you discuss any questions you have with your health care provider. ° °

## 2014-06-09 NOTE — Progress Notes (Signed)
Patient presents for BP check Med list reviewed; states taking all meds as directed Discussed need for low sodium diet and using Mrs. Dash as alternative to salt Discussed walking 30 minutes per day for exercise Patient denies headaches, blurred vision, SHOB, chest pain or pressure  BP 168/98  left arm manually with adult cuff P 77 R 16  T  97.9 oral SPO2  97%   Per PCP: D/c losartan Start chlorthalidone 25 mg daily  Patient advised to call for med refills at least 7 days before running out so as not to go without.  Patient aware that he is to f/u with PCP 6 weeks from last visit (Due 06/30/14)  Patient given literature on [facility]

## 2014-06-12 ENCOUNTER — Telehealth: Payer: Self-pay | Admitting: *Deleted

## 2014-06-12 NOTE — Telephone Encounter (Signed)
Left voice message with normal labs If any question return call

## 2014-06-12 NOTE — Telephone Encounter (Signed)
-----   Message from Minerva Ends, MD sent at 06/10/2014  8:49 AM EST ----- Normal BMP

## 2014-06-28 ENCOUNTER — Encounter: Payer: Self-pay | Admitting: Family Medicine

## 2014-06-28 ENCOUNTER — Ambulatory Visit: Payer: Self-pay | Attending: Family Medicine | Admitting: Family Medicine

## 2014-06-28 VITALS — BP 122/88 | HR 72 | Temp 97.7°F | Resp 16 | Ht 69.0 in | Wt 266.0 lb

## 2014-06-28 DIAGNOSIS — Z114 Encounter for screening for human immunodeficiency virus [HIV]: Secondary | ICD-10-CM

## 2014-06-28 DIAGNOSIS — I10 Essential (primary) hypertension: Secondary | ICD-10-CM | POA: Insufficient documentation

## 2014-06-28 DIAGNOSIS — R067 Sneezing: Secondary | ICD-10-CM | POA: Insufficient documentation

## 2014-06-28 DIAGNOSIS — Z23 Encounter for immunization: Secondary | ICD-10-CM

## 2014-06-28 MED ORDER — CETIRIZINE HCL 10 MG PO TABS: 10.0000 mg | ORAL_TABLET | Freq: Every day | ORAL | Status: DC

## 2014-06-28 NOTE — Patient Instructions (Signed)
[name],  Congratulations on meeting smoking cessation goal! Awesome job.  1. HTN: Goal < 140/90 Continue chorthalidone. Continue DASH diet Referral to nutritionist for added support  2. Health maintenance: Tdap Screening HIV  F/u in 3 months for HTN  Dr. Adrian Blackwater    DASH Eating Plan DASH stands for "Dietary Approaches to Stop Hypertension." The DASH eating plan is a healthy eating plan that has been shown to reduce high blood pressure (hypertension). Additional health benefits may include reducing the risk of type 2 diabetes mellitus, heart disease, and stroke. The DASH eating plan may also help with weight loss. WHAT DO I NEED TO KNOW ABOUT THE DASH EATING PLAN? For the DASH eating plan, you will follow these general guidelines:  Choose foods with a percent daily value for sodium of less than 5% (as listed on the food label).  Use salt-free seasonings or herbs instead of table salt or sea salt.  Check with your health care provider or pharmacist before using salt substitutes.  Eat lower-sodium products, often labeled as "lower sodium" or "no salt added."  Eat fresh foods.  Eat more vegetables, fruits, and low-fat dairy products.  Choose whole grains. Look for the word "whole" as the first word in the ingredient list.  Choose fish and skinless chicken or [place] more often than red meat. Limit fish, poultry, and meat to 6 oz (170 g) each day.  Limit sweets, desserts, sugars, and sugary drinks.  Choose heart-healthy fats.  Limit cheese to 1 oz (28 g) per day.  Eat more home-cooked food and less restaurant, buffet, and fast food.  Limit fried foods.  Cook foods using methods other than frying.  Limit canned vegetables. If you do use them, rinse them well to decrease the sodium.  When eating at a restaurant, ask that your food be prepared with less salt, or no salt if possible. WHAT FOODS CAN I EAT? Seek help from a dietitian for individual calorie  needs. Grains Whole grain or whole wheat bread. Brown rice. Whole grain or whole wheat pasta. Quinoa, bulgur, and whole grain cereals. Low-sodium cereals. Corn or whole wheat flour tortillas. Whole grain cornbread. Whole grain crackers. Low-sodium crackers. Vegetables Fresh or frozen vegetables (raw, steamed, roasted, or grilled). Low-sodium or reduced-sodium tomato and vegetable juices. Low-sodium or reduced-sodium tomato sauce and paste. Low-sodium or reduced-sodium canned vegetables.  Fruits All fresh, canned (in natural juice), or frozen fruits. Meat and Other Protein Products Ground beef (85% or leaner), grass-fed beef, or beef trimmed of fat. Skinless chicken or [place]. Ground chicken or [place]. Pork trimmed of fat. All fish and seafood. Eggs. Dried beans, peas, or lentils. Unsalted nuts and seeds. Unsalted canned beans. Dairy Low-fat dairy products, such as skim or 1% milk, 2% or reduced-fat cheeses, low-fat ricotta or cottage cheese, or plain low-fat yogurt. Low-sodium or reduced-sodium cheeses. Fats and Oils Tub margarines without trans fats. Light or reduced-fat mayonnaise and salad dressings (reduced sodium). Avocado. Safflower, olive, or canola oils. Natural peanut or almond butter. Other Unsalted popcorn and pretzels. The items listed above may not be a complete list of recommended foods or beverages. Contact your dietitian for more options. WHAT FOODS ARE NOT RECOMMENDED? Grains White bread. White pasta. White rice. Refined cornbread. Bagels and croissants. Crackers that contain trans fat. Vegetables Creamed or fried vegetables. Vegetables in a cheese sauce. Regular canned vegetables. Regular canned tomato sauce and paste. Regular tomato and vegetable juices. Fruits Dried fruits. Canned fruit in light or heavy syrup. Fruit juice.  Meat and Other Protein Products Fatty cuts of meat. Ribs, chicken wings, bacon, sausage, bologna, salami, chitterlings, fatback, hot dogs, bratwurst,  and packaged luncheon meats. Salted nuts and seeds. Canned beans with salt. Dairy Whole or 2% milk, cream, half-and-half, and cream cheese. Whole-fat or sweetened yogurt. Full-fat cheeses or blue cheese. Nondairy creamers and whipped toppings. Processed cheese, cheese spreads, or cheese curds. Condiments Onion and garlic salt, seasoned salt, table salt, and sea salt. Canned and packaged gravies. Worcestershire sauce. Tartar sauce. Barbecue sauce. Teriyaki sauce. Soy sauce, including reduced sodium. Steak sauce. Fish sauce. Oyster sauce. Cocktail sauce. Horseradish. Ketchup and mustard. Meat flavorings and tenderizers. Bouillon cubes. Hot sauce. Tabasco sauce. Marinades. Taco seasonings. Relishes. Fats and Oils Butter, stick margarine, lard, shortening, ghee, and bacon fat. Coconut, palm kernel, or palm oils. Regular salad dressings. Other Pickles and olives. Salted popcorn and pretzels. The items listed above may not be a complete list of foods and beverages to avoid. Contact your dietitian for more information. WHERE CAN I FIND MORE INFORMATION? National Heart, Lung, and Blood Institute: travelstabloid.com Document Released: 03/20/2011 Document Revised: 08/15/2013 Document Reviewed: 02/02/2013 [facility] Patient Information 2015 [place], [place]. This information is not intended to replace advice given to you by your health care provider. Make sure you discuss any questions you have with your health care provider.

## 2014-06-28 NOTE — Progress Notes (Signed)
F/U HTN Taking medication as prescribed  No tobacco used since November 2015

## 2014-06-28 NOTE — Progress Notes (Signed)
   Subjective:    Patient ID: Juan Kline, male    DOB: Nov 19, 1970, 44 y.o.   MRN: [id number] CC: f/u HTN  HPI  CHRONIC HYPERTENSION  Disease Monitoring  Blood pressure range: not checking   Chest pain: no   Dyspnea: no  Claudication: no   Medication compliance: yes  Medication Side Effects  Lightheadedness: no   Urinary frequency: no   Edema: no   Impotence: no   Preventitive Healthcare:  Exercise: yes,  Minimal    Diet Pattern: regular meals   Salt Restriction: yes   Soc Hx: non smoker since 02/2014  Review of Systems As per HPI     Objective:   Physical Exam BP 136/90 mmHg  Pulse 72  Temp(Src) 97.7 F (36.5 C) (Oral)  Resp 16  Ht 5\' 9"  (1.753 m)  Wt 266 lb (120.657 kg)  BMI 39.26 kg/m2  SpO2 98% General appearance: alert, cooperative and no distress Lungs: clear to auscultation bilaterally Heart: regular rate and rhythm, S1, S2 normal, no murmur, click, rub or gallop Extremities: extremities normal, atraumatic, no cyanosis or edema      Assessment & Plan:

## 2014-06-28 NOTE — Assessment & Plan Note (Signed)
HTN: Goal < 140/90 Continue chorthalidone. Continue DASH diet Referral to nutritionist for added support

## 2014-06-28 NOTE — Assessment & Plan Note (Signed)
Screening HIV ordered  

## 2014-06-29 ENCOUNTER — Telehealth: Payer: Self-pay | Admitting: *Deleted

## 2014-06-29 LAB — HIV ANTIBODY (ROUTINE TESTING W REFLEX): HIV 1&2 Ab, 4th Generation: NONREACTIVE

## 2014-06-29 NOTE — Telephone Encounter (Signed)
-----   Message from Boykin Nearing, MD sent at 06/29/2014  9:19 AM EDT ----- Screening HIV negative

## 2014-06-29 NOTE — Telephone Encounter (Signed)
Pt aware of results 

## 2014-07-18 ENCOUNTER — Telehealth: Payer: Self-pay | Admitting: Family Medicine

## 2014-07-18 NOTE — Telephone Encounter (Signed)
Pt calling for bp medication refill. Please f/u with pt.

## 2014-07-19 ENCOUNTER — Other Ambulatory Visit: Payer: Self-pay | Admitting: *Deleted

## 2014-07-19 MED ORDER — CHLORTHALIDONE 25 MG PO TABS: 25.0000 mg | ORAL_TABLET | Freq: Every day | ORAL | Status: DC

## 2014-07-19 NOTE — Telephone Encounter (Signed)
Rx refill send to [facility]  Pt aware

## 2014-07-28 ENCOUNTER — Ambulatory Visit: Payer: Self-pay | Admitting: Skilled Nursing Facility1

## 2014-08-21 ENCOUNTER — Ambulatory Visit: Payer: Self-pay | Attending: Family Medicine | Admitting: Family Medicine

## 2014-08-21 ENCOUNTER — Encounter: Payer: Self-pay | Admitting: Family Medicine

## 2014-08-21 VITALS — BP 132/91 | HR 81 | Temp 98.6°F | Resp 18 | Ht 69.0 in | Wt 264.0 lb

## 2014-08-21 DIAGNOSIS — M62838 Other muscle spasm: Secondary | ICD-10-CM

## 2014-08-21 DIAGNOSIS — M542 Cervicalgia: Secondary | ICD-10-CM | POA: Insufficient documentation

## 2014-08-21 DIAGNOSIS — I10 Essential (primary) hypertension: Secondary | ICD-10-CM

## 2014-08-21 DIAGNOSIS — Z87891 Personal history of nicotine dependence: Secondary | ICD-10-CM | POA: Insufficient documentation

## 2014-08-21 DIAGNOSIS — M25511 Pain in right shoulder: Secondary | ICD-10-CM | POA: Insufficient documentation

## 2014-08-21 MED ORDER — CYCLOBENZAPRINE HCL 10 MG PO TABS: 10.0000 mg | ORAL_TABLET | Freq: Every evening | ORAL | Status: DC | PRN

## 2014-08-21 MED ORDER — DICLOFENAC SODIUM 75 MG PO TBEC
75.0000 mg | DELAYED_RELEASE_TABLET | Freq: Two times a day (BID) | ORAL | Status: DC
Start: 2014-08-21 — End: 2015-01-17

## 2014-08-21 NOTE — Patient Instructions (Addendum)
Juan Kline,  Thank you for coming in today.  1. You are having a muscle spasm in your R supraspinatus muscle  Take diclofenac at dinner time Take flexeril 30 minutes before bed Massage can also be helpful   2. HTN: BP is a bit elevated today, but you are in pain.   F/u in 4 weeks for RN BP check  F/u with me in 3 months for HTN, sooner if needed   Dr. Adrian Blackwater

## 2014-08-21 NOTE — Assessment & Plan Note (Signed)
HTN: BP is a bit elevated today, but you are in pain.

## 2014-08-21 NOTE — Progress Notes (Signed)
   Subjective:    Patient ID: Juan Kline, male    DOB: 1970/06/15, 44 y.o.   MRN: [id number] CC: upper back, R arm and shoulder pain x 5 days  HPI  1. R shoulder pain: x 6 days. Started gradually. Getting worse. R upper posterior shoulder with neck pain. Pain is sharp and achy. Aleve has not helped. No weakness in R hand.   2. HTN; BP elevated today. Patient in pain. Taking HCTZ.   Soc Hx: former smoker quit in 2015  Review of Systems  Constitutional: Negative for fever and chills.  Neurological: Negative for weakness and numbness.       Objective:   Physical Exam BP 132/91 mmHg  Pulse 81  Temp(Src) 98.6 F (37 C) (Oral)  Resp 18  Ht 5\' 9"  (1.753 m)  Wt 264 lb (119.75 kg)  BMI 38.97 kg/m2  SpO2 97% General appearance: alert, cooperative and no distress Neck: supple, symmetrical, trachea midline  Shoulder: Inspection reveals no abnormalities, atrophy or asymmetry. Palpation reveals R supraspinatus tenderness  ROM is full in all planes. Rotator cuff strength normal throughout. Normal scapular function observed. No painful arc and no drop arm sign.        Assessment & Plan:

## 2014-08-21 NOTE — Assessment & Plan Note (Signed)
Take diclofenac at dinner time Take flexeril 30 minutes before bed Massage can also be helpful

## 2014-08-21 NOTE — Progress Notes (Signed)
Complaining of upper back pain, shoulder and rt arm x 5 days No injury worsen with movement

## 2014-09-25 ENCOUNTER — Other Ambulatory Visit: Payer: Self-pay | Admitting: *Deleted

## 2014-09-25 ENCOUNTER — Telehealth: Payer: Self-pay | Admitting: Family Medicine

## 2014-09-25 MED ORDER — CHLORTHALIDONE 25 MG PO TABS: 25.0000 mg | ORAL_TABLET | Freq: Every day | ORAL | Status: DC

## 2014-09-25 NOTE — Telephone Encounter (Signed)
Rx refill send to [facility]  Pt aware

## 2014-09-25 NOTE — Telephone Encounter (Signed)
Patient called requesting medication refill on chlorthalidone (HYGROTON) 25 MG tablet . Please f/u with patient

## 2014-10-02 ENCOUNTER — Ambulatory Visit: Payer: Self-pay | Attending: Family Medicine

## 2014-10-18 ENCOUNTER — Other Ambulatory Visit: Payer: Self-pay | Admitting: Family Medicine

## 2014-11-27 ENCOUNTER — Ambulatory Visit: Payer: Self-pay | Attending: Family Medicine | Admitting: Family Medicine

## 2014-11-27 ENCOUNTER — Encounter: Payer: Self-pay | Admitting: Family Medicine

## 2014-11-27 VITALS — BP 138/94 | HR 81 | Temp 98.0°F | Resp 16 | Wt 256.8 lb

## 2014-11-27 DIAGNOSIS — M79672 Pain in left foot: Secondary | ICD-10-CM | POA: Insufficient documentation

## 2014-11-27 DIAGNOSIS — E559 Vitamin D deficiency, unspecified: Secondary | ICD-10-CM

## 2014-11-27 DIAGNOSIS — R7303 Prediabetes: Secondary | ICD-10-CM | POA: Insufficient documentation

## 2014-11-27 DIAGNOSIS — I10 Essential (primary) hypertension: Secondary | ICD-10-CM | POA: Insufficient documentation

## 2014-11-27 DIAGNOSIS — M79671 Pain in right foot: Secondary | ICD-10-CM | POA: Insufficient documentation

## 2014-11-27 DIAGNOSIS — Z87891 Personal history of nicotine dependence: Secondary | ICD-10-CM | POA: Insufficient documentation

## 2014-11-27 DIAGNOSIS — R7309 Other abnormal glucose: Secondary | ICD-10-CM

## 2014-11-27 DIAGNOSIS — R202 Paresthesia of skin: Secondary | ICD-10-CM

## 2014-11-27 DIAGNOSIS — E785 Hyperlipidemia, unspecified: Secondary | ICD-10-CM | POA: Insufficient documentation

## 2014-11-27 LAB — LIPID PANEL
CHOLESTEROL: 159 mg/dL (ref 125–200)
HDL: 37 mg/dL — ABNORMAL LOW (ref >=40)
LDL Cholesterol: 84 mg/dL (ref <130)
Total CHOL/HDL Ratio: 4.3 Ratio (ref <=5.0)
Triglycerides: 190 mg/dL — ABNORMAL HIGH (ref <150)
VLDL: 38 mg/dL — AB (ref <30)

## 2014-11-27 LAB — COMPLETE METABOLIC PANEL WITH GFR
ALBUMIN: 4.3 g/dL (ref 3.6–5.1)
ALT: 24 U/L (ref 9–46)
AST: 21 U/L (ref 10–40)
Alkaline Phosphatase: 60 U/L (ref 40–115)
BUN: 18 mg/dL (ref 7–25)
CHLORIDE: 99 mmol/L (ref 98–110)
CO2: 29 mmol/L (ref 20–31)
Calcium: 9.5 mg/dL (ref 8.6–10.3)
Creat: 0.81 mg/dL (ref 0.60–1.35)
GFR, Est African American: >89 mL/min (ref >=60)
GFR, Est Non African American: >89 mL/min (ref >=60)
GLUCOSE: 108 mg/dL — AB (ref 65–99)
POTASSIUM: 4 mmol/L (ref 3.5–5.3)
SODIUM: 140 mmol/L (ref 135–146)
Total Bilirubin: 0.6 mg/dL (ref 0.2–1.2)
Total Protein: 7.4 g/dL (ref 6.1–8.1)

## 2014-11-27 LAB — POCT GLYCOSYLATED HEMOGLOBIN (HGB A1C): Hemoglobin A1C: 5.5

## 2014-11-27 MED ORDER — PREGABALIN 75 MG PO CAPS: 75.0000 mg | ORAL_CAPSULE | Freq: Two times a day (BID) | ORAL | Status: DC

## 2014-11-27 MED ORDER — LOSARTAN POTASSIUM 25 MG PO TABS: 25.0000 mg | ORAL_TABLET | Freq: Every day | ORAL | Status: DC

## 2014-11-27 MED ORDER — GABAPENTIN 300 MG PO CAPS: 300.0000 mg | ORAL_CAPSULE | Freq: Every day | ORAL | Status: DC

## 2014-11-27 MED ORDER — ATORVASTATIN CALCIUM 20 MG PO TABS: 20.0000 mg | ORAL_TABLET | Freq: Every day | ORAL | Status: DC

## 2014-11-27 NOTE — Assessment & Plan Note (Signed)
Sharp foot nerve pain: Ordered lyrica Continue gabapentin for now

## 2014-11-27 NOTE — Assessment & Plan Note (Signed)
High cholesterol: Checking lipids today

## 2014-11-27 NOTE — Patient Instructions (Signed)
Mr. Hegner,  Thank you for coming in today  1. HTN with dizziness and lightheadedness Stop chlorathalidone Start losartan 25 mg daily Checking sodium on CMP today BP goal < 140/90   2. High cholesterol: Checking lipids today  3. Sharp foot nerve pain: Ordered lyrica Continue gabapentin for now  4. Dizziness: Most likely related to BP medicine Also checking vit D level  Great job with weight loss. Keep up the exercise. Wear a supportive belt or waist brace to support hernia when doing heavy lifting or bending  F/u in 3-4 weeks with RN for BP check and flu shot  F/u with me in 3 months for HTN   Dr. Adrian Blackwater

## 2014-11-27 NOTE — Assessment & Plan Note (Signed)
Rechecking vit D level to rule out progression for insufficiency to deficiency

## 2014-11-27 NOTE — Assessment & Plan Note (Signed)
HTN with dizziness and lightheadedness Stop chlorathalidone Start losartan 25 mg daily Checking sodium on CMP today BP goal < 140/90

## 2014-11-27 NOTE — Progress Notes (Signed)
   Subjective:    Patient ID: Juan Kline, male    DOB: December 30, 1970, 43 y.o.   MRN: [id number] CC: f/u HTN and HLD, med refills  HPI 44 yo M former smoker presents for f/u visit:  1. CHRONIC HYPERTENSION  Disease Monitoring  Blood pressure range: not checking   Chest pain: no   Dyspnea: no   Claudication: no   Medication compliance: yes  Medication Side Effects  Lightheadedness: yes   Urinary frequency: no   Edema: no   Impotence: no   Preventitive Healthcare:  Exercise: yes     2. HLD: taking lipitor. No myalgias. Having tingling, numbness and sharp pains in feet.  3. Foot pains: both feet. Plantar surface. Sharp pains. Gabapentin helped but 600 mg caused over sedation.    Social History  Substance Use Topics  . Smoking status: Former Smoker -- 0.10 packs/day for 26 years    Types: Cigarettes    Quit date: 02/16/2014  . Smokeless tobacco: Never Used  . Alcohol Use: Yes     Comment: 03/02/2014 "I rarely drink"   Review of Systems  Constitutional: Negative for fever, chills, fatigue and unexpected weight change.  Eyes: Negative for visual disturbance.  Respiratory: Negative for cough and shortness of breath.   Cardiovascular: Negative for chest pain, palpitations and leg swelling.  Gastrointestinal: Negative for nausea, vomiting, abdominal pain, diarrhea, constipation and blood in stool.  Endocrine: Negative for polydipsia, polyphagia and polyuria.  Musculoskeletal: Negative for myalgias, back pain, arthralgias, gait problem and neck pain.  Skin: Negative for rash.  Allergic/Immunologic: Negative for immunocompromised state.  Neurological: Positive for dizziness and light-headedness.  Hematological: Negative for adenopathy. Does not bruise/bleed easily.  Psychiatric/Behavioral: Negative for suicidal ideas, sleep disturbance and dysphoric mood. The patient is not nervous/anxious.        Objective:   Physical Exam  Constitutional: He appears well-developed and  well-nourished. No distress.  Eyes: Conjunctivae and EOM are normal. Pupils are equal, round, and reactive to light.  Neck: Normal range of motion. Neck supple.  Cardiovascular: Normal rate, regular rhythm, normal heart sounds and intact distal pulses.   Pulmonary/Chest: Effort normal and breath sounds normal.  Musculoskeletal: He exhibits no edema.  Neurological: He is alert.  Skin: Skin is warm and dry. No rash noted. No erythema.  Psychiatric: He has a normal mood and affect.  BP 138/94 mmHg  Pulse 81  Temp(Src) 98 F (36.7 C)  Resp 16  Wt 256 lb 12.8 oz (116.484 kg)  SpO2 100%  BP Readings from Last 3 Encounters:  11/27/14 138/94  08/21/14 132/91  06/28/14 122/88   Wt Readings from Last 3 Encounters:  11/27/14 256 lb 12.8 oz (116.484 kg)  08/21/14 264 lb (119.75 kg)  06/28/14 266 lb (120.657 kg)   Lipid Panel     Component Value Date/Time   CHOL 254* 05/19/2014 1252   TRIG 328* 05/19/2014 1252   HDL 43 05/19/2014 1252   CHOLHDL 5.9 05/19/2014 1252   VLDL 66* 05/19/2014 1252   LDLCALC 145* 05/19/2014 1252       Assessment & Plan:

## 2014-11-27 NOTE — Progress Notes (Signed)
Patient states he is here for follow up on his blood pressure and cholesterol Patient also requesting refills on his medication

## 2014-11-28 LAB — VITAMIN D 25 HYDROXY (VIT D DEFICIENCY, FRACTURES): Vit D, 25-Hydroxy: 36 ng/mL (ref 30–100)

## 2014-12-01 ENCOUNTER — Telehealth: Payer: Self-pay | Admitting: Family Medicine

## 2014-12-01 NOTE — Telephone Encounter (Signed)
-----   Message from Boykin Nearing, MD sent at 11/28/2014  8:53 AM EDT ----- Vit D is now normal. Overall cholesterol panel has improved.  Continue current medication regimen

## 2014-12-01 NOTE — Telephone Encounter (Signed)
Pt called today for lab results Verified Date of birth Lab results given, advised to continue taking medication as prescribed  Maintain a low fat diet and exercise minimum 3x per week

## 2014-12-01 NOTE — Telephone Encounter (Signed)
Patient called requesting lab results, please f/u with patient

## 2015-01-17 ENCOUNTER — Ambulatory Visit: Payer: Self-pay | Attending: Family Medicine | Admitting: Pharmacist

## 2015-01-17 VITALS — BP 132/78 | HR 70

## 2015-01-17 DIAGNOSIS — Z79899 Other long term (current) drug therapy: Secondary | ICD-10-CM | POA: Insufficient documentation

## 2015-01-17 DIAGNOSIS — Z23 Encounter for immunization: Secondary | ICD-10-CM | POA: Insufficient documentation

## 2015-01-17 DIAGNOSIS — I10 Essential (primary) hypertension: Secondary | ICD-10-CM | POA: Insufficient documentation

## 2015-01-17 MED ORDER — INFLUENZA VAC SPLIT QUAD 0.5 ML IM SUSY
0.5000 mL | PREFILLED_SYRINGE | Freq: Once | INTRAMUSCULAR | Status: AC
  Administered 2015-01-17: 0.5 mL via INTRAMUSCULAR

## 2015-01-17 NOTE — Patient Instructions (Signed)
Thanks for coming to see me!  Your blood pressure is much better on these medications. Keep taking all of your medications.  Follow up with Dr. Adrian Blackwater  Influenza Virus Vaccine injection (Fluarix) What is this medicine? INFLUENZA VIRUS VACCINE (in floo EN zuh VAHY ruhs vak SEEN) helps to reduce the risk of getting influenza also known as the flu. This medicine may be used for other purposes; ask your health care provider or pharmacist if you have questions. What should I tell my health care provider before I take this medicine? They need to know if you have any of these conditions: -bleeding disorder like hemophilia -fever or infection -Guillain-Barre syndrome or other neurological problems -immune system problems -infection with the human immunodeficiency virus (HIV) or AIDS -low blood platelet counts -multiple sclerosis -an unusual or allergic reaction to influenza virus vaccine, eggs, chicken proteins, latex, gentamicin, other medicines, foods, dyes or preservatives -pregnant or trying to get pregnant -breast-feeding How should I use this medicine? This vaccine is for injection into a muscle. It is given by a health care professional. A copy of Vaccine Information Statements will be given before each vaccination. Read this sheet carefully each time. The sheet may change frequently. Talk to your pediatrician regarding the use of this medicine in children. Special care may be needed. Overdosage: If you think you have taken too much of this medicine contact a poison control center or emergency room at once. NOTE: This medicine is only for you. Do not share this medicine with others. What if I miss a dose? This does not apply. What may interact with this medicine? -chemotherapy or radiation therapy -medicines that lower your immune system like etanercept, anakinra, infliximab, and adalimumab -medicines that treat or prevent blood clots like warfarin -phenytoin -steroid medicines  like prednisone or cortisone -theophylline -vaccines This list may not describe all possible interactions. Give your health care provider a list of all the medicines, herbs, non-prescription drugs, or dietary supplements you use. Also tell them if you smoke, drink alcohol, or use illegal drugs. Some items may interact with your medicine. What should I watch for while using this medicine? Report any side effects that do not go away within 3 days to your doctor or health care professional. Call your health care provider if any unusual symptoms occur within 6 weeks of receiving this vaccine. You may still catch the flu, but the illness is not usually as bad. You cannot get the flu from the vaccine. The vaccine will not protect against colds or other illnesses that may cause fever. The vaccine is needed every year. What side effects may I notice from receiving this medicine? Side effects that you should report to your doctor or health care professional as soon as possible: -allergic reactions like skin rash, itching or hives, swelling of the face, lips, or tongue Side effects that usually do not require medical attention (report to your doctor or health care professional if they continue or are bothersome): -fever -headache -muscle aches and pains -pain, tenderness, redness, or swelling at site where injected -weak or tired This list may not describe all possible side effects. Call your doctor for medical advice about side effects. You may report side effects to FDA at 1-800-FDA-1088. Where should I keep my medicine? This vaccine is only given in a clinic, pharmacy, doctor's office, or other health care setting and will not be stored at home. NOTE: This sheet is a summary. It may not cover all possible information. If you have  questions about this medicine, talk to your doctor, pharmacist, or health care provider.    2016, Elsevier/Gold Standard. (2007-10-27 09:30:40)

## 2015-01-17 NOTE — Addendum Note (Signed)
Addended by: Nicoletta Ba A on: 01/17/2015 02:06 PM   Modules accepted: Level of Service

## 2015-01-17 NOTE — Progress Notes (Signed)
S:    Patient arrives in good spirits.    Presents to the clinic for hypertension evaluation.   Patient reports adherence with medications. He is currently taking gabapentin as he has not heard if he was approved for the patient assistance program for Lyrica.  Current BP Medications include:  Losartan 25 mg  Antihypertensives tried in the past include: chlorthalidone   O:   Last 3 Office BP readings: BP Readings from Last 3 Encounters:  01/17/15 132/78  11/27/14 138/94  08/21/14 132/91    BMET    Component Value Date/Time   NA 140 11/27/2014 1032   K 4.0 11/27/2014 1032   CL 99 11/27/2014 1032   CO2 29 11/27/2014 1032   GLUCOSE 108* 11/27/2014 1032   BUN 18 11/27/2014 1032   CREATININE 0.81 11/27/2014 1032   CREATININE 0.76 03/03/2014 1520   CALCIUM 9.5 11/27/2014 1032   GFRNONAA >89 11/27/2014 1032   GFRNONAA >90 03/03/2014 1520   GFRAA >89 11/27/2014 1032   GFRAA >90 03/03/2014 1520    A/P: History of hypertension currently controlled on losartan 25 mg daily. Dizziness and lightheadedness has improved since switching from chlorthalidone. No recommendations for any changes to blood pressure medications. Patient to continue all medications as prescribed.   Influenza vaccine was administered to patient.  Patient to follow up with pharmacy to see if his patient assistance application for Lyrica was approved.  Results reviewed and written information provided.   Total time in face-to-face counseling 20 minutes.   F/U Clinic Visit with Dr. Adrian Blackwater.

## 2015-02-26 ENCOUNTER — Other Ambulatory Visit: Payer: Self-pay | Admitting: Family Medicine

## 2015-02-26 DIAGNOSIS — R202 Paresthesia of skin: Secondary | ICD-10-CM

## 2015-02-26 MED ORDER — PREGABALIN 75 MG PO CAPS: 75.0000 mg | ORAL_CAPSULE | Freq: Two times a day (BID) | ORAL | Status: DC

## 2015-03-14 ENCOUNTER — Ambulatory Visit: Payer: Self-pay | Attending: Family Medicine

## 2015-04-16 MED FILL — LOSARTAN POTASSIUM 25 MG TA: 25 | 30 days supply | Qty: 30 | Fill #5

## 2015-04-16 MED FILL — ?ATORVASTATIN 20 MG TABLET: 20 | 30 days supply | Qty: 30 | Fill #4

## 2015-04-23 ENCOUNTER — Ambulatory Visit: Payer: Self-pay | Admitting: Family Medicine

## 2015-05-11 ENCOUNTER — Ambulatory Visit: Payer: Self-pay | Attending: Family Medicine | Admitting: Family Medicine

## 2015-05-11 ENCOUNTER — Other Ambulatory Visit: Payer: Self-pay

## 2015-05-11 ENCOUNTER — Encounter: Payer: Self-pay | Admitting: Family Medicine

## 2015-05-11 ENCOUNTER — Encounter (HOSPITAL_BASED_OUTPATIENT_CLINIC_OR_DEPARTMENT_OTHER): Payer: Self-pay | Admitting: Clinical

## 2015-05-11 VITALS — BP 157/111 | HR 87 | Temp 98.1°F | Resp 16 | Ht 69.0 in | Wt 262.0 lb

## 2015-05-11 DIAGNOSIS — R079 Chest pain, unspecified: Secondary | ICD-10-CM | POA: Insufficient documentation

## 2015-05-11 DIAGNOSIS — I10 Essential (primary) hypertension: Secondary | ICD-10-CM | POA: Insufficient documentation

## 2015-05-11 DIAGNOSIS — Z79899 Other long term (current) drug therapy: Secondary | ICD-10-CM | POA: Insufficient documentation

## 2015-05-11 DIAGNOSIS — F411 Generalized anxiety disorder: Secondary | ICD-10-CM

## 2015-05-11 DIAGNOSIS — E785 Hyperlipidemia, unspecified: Secondary | ICD-10-CM | POA: Insufficient documentation

## 2015-05-11 DIAGNOSIS — R002 Palpitations: Secondary | ICD-10-CM | POA: Insufficient documentation

## 2015-05-11 DIAGNOSIS — R42 Dizziness and giddiness: Secondary | ICD-10-CM | POA: Insufficient documentation

## 2015-05-11 DIAGNOSIS — F419 Anxiety disorder, unspecified: Secondary | ICD-10-CM | POA: Insufficient documentation

## 2015-05-11 DIAGNOSIS — L84 Corns and callosities: Secondary | ICD-10-CM | POA: Insufficient documentation

## 2015-05-11 DIAGNOSIS — Z87891 Personal history of nicotine dependence: Secondary | ICD-10-CM | POA: Insufficient documentation

## 2015-05-11 LAB — POCT GLYCOSYLATED HEMOGLOBIN (HGB A1C): Hemoglobin A1C: 5.6

## 2015-05-11 LAB — CBC
HCT: 46.3 % (ref 39.0–52.0)
HEMOGLOBIN: 15.6 g/dL (ref 13.0–17.0)
MCH: 29.7 pg (ref 26.0–34.0)
MCHC: 33.7 g/dL (ref 30.0–36.0)
MCV: 88.2 fL (ref 78.0–100.0)
MPV: 9.4 fL (ref 8.6–12.4)
Platelets: 263 10*3/uL (ref 150–400)
RBC: 5.25 MIL/uL (ref 4.22–5.81)
RDW: 13.8 % (ref 11.5–15.5)
WBC: 7.3 10*3/uL (ref 4.0–10.5)

## 2015-05-11 LAB — GLUCOSE, POCT (MANUAL RESULT ENTRY): POC Glucose: 109 mg/dl — AB (ref 70–99)

## 2015-05-11 LAB — TSH: TSH: 0.746 u[IU]/mL (ref 0.350–4.500)

## 2015-05-11 MED ORDER — LOSARTAN POTASSIUM 50 MG PO TABS: 50.0000 mg | ORAL_TABLET | Freq: Every day | ORAL | Status: DC

## 2015-05-11 MED ORDER — CARVEDILOL 6.25 MG PO TABS: 6.2500 mg | ORAL_TABLET | Freq: Two times a day (BID) | ORAL | Status: DC

## 2015-05-11 MED ORDER — ATORVASTATIN CALCIUM 20 MG PO TABS: 20.0000 mg | ORAL_TABLET | Freq: Every day | ORAL | Status: DC

## 2015-05-11 MED FILL — ?ATORVASTATIN 20 MG TABLET: 20 | 30 days supply | Qty: 30 | Fill #0

## 2015-05-11 MED FILL — LOSARTAN POTASSIUM 50 MG TA: 50 | 30 days supply | Qty: 30 | Fill #0

## 2015-05-11 MED FILL — CARVEDILOL 6.25 MG TABLET: 6.25 | 30 days supply | Qty: 60 | Fill #0

## 2015-05-11 NOTE — Progress Notes (Signed)
Subjective:  Patient ID: Juan Kline, male    DOB: [birth year]  Age: 45 y.o. MRN: [id number]  CC: Chest Pain and Anxiety   HPI Juan Kline presents for    1. Chest pain: x 4 weeks with dizziness, irritable mood, palpitations. Symptoms occur at rest. He endorses anxiety. He denies smoking, alcohol and illicit drug use.   2. CHRONIC HYPERTENSION  Disease Monitoring  Blood pressure range: not checking   Chest pain: yes   Dyspnea: no   Claudication: no   Medication compliance: yes  Medication Side Effects  Lightheadedness: yes   Urinary frequency: no   Edema: no    Social History  Substance Use Topics  . Smoking status: Former Smoker -- 0.10 packs/day for 26 years    Types: Cigarettes    Quit date: 02/16/2014  . Smokeless tobacco: Never Used  . Alcohol Use: Yes     Comment: 03/02/2014 "I rarely drink"    Outpatient Prescriptions Prior to Visit  Medication Sig Dispense Refill  . atorvastatin (LIPITOR) 20 MG tablet Take 1 tablet (20 mg total) by mouth daily. 30 tablet 11  . losartan (COZAAR) 25 MG tablet Take 1 tablet (25 mg total) by mouth daily. 30 tablet 5  . pregabalin (LYRICA) 75 MG capsule Take 1 capsule (75 mg total) by mouth 2 (two) times daily. 180 capsule 2  . calcium citrate-vitamin D 500-400 MG-UNIT chewable tablet Chew 1 tablet by mouth 2 (two) times daily. (Patient not taking: Reported on 05/11/2015) 180 tablet 1  . cetirizine (ZYRTEC) 10 MG tablet Take 1 tablet (10 mg total) by mouth daily. (Patient not taking: Reported on 05/11/2015) 30 tablet 11  . gabapentin (NEURONTIN) 300 MG capsule Take 1 capsule (300 mg total) by mouth at bedtime. (Patient not taking: Reported on 05/11/2015) 30 capsule 0   No facility-administered medications prior to visit.    ROS Review of Systems  Constitutional: Negative for fever, chills, fatigue and unexpected weight change.  Eyes: Negative for visual disturbance.  Respiratory: Negative for cough and shortness of breath.     Cardiovascular: Positive for chest pain. Negative for palpitations and leg swelling.  Gastrointestinal: Negative for nausea, vomiting, abdominal pain, diarrhea, constipation and blood in stool.  Endocrine: Negative for polydipsia, polyphagia and polyuria.  Musculoskeletal: Negative for myalgias, back pain, arthralgias, gait problem and neck pain.  Skin: Negative for rash.  Allergic/Immunologic: Negative for immunocompromised state.  Neurological: Positive for dizziness.  Hematological: Negative for adenopathy. Does not bruise/bleed easily.  Psychiatric/Behavioral: Negative for suicidal ideas, sleep disturbance and dysphoric mood. The patient is not nervous/anxious.     Objective:  BP 157/111 mmHg  Pulse 87  Temp(Src) 98.1 F (36.7 C) (Oral)  Resp 16  Ht 5\' 9"  (1.753 m)  Wt 262 lb (118.842 kg)  BMI 38.67 kg/m2  SpO2 99%  BP/Weight 05/11/2015 01/17/2015 [id number]  Systolic BP [id number]  Diastolic BP 99991111 78 94  Wt. (Lbs) 262 - 256.8  BMI 38.67 - 37.91    Physical Exam  Constitutional: He appears well-developed and well-nourished. No distress.  HENT:  Head: Normocephalic and atraumatic.  Neck: Normal range of motion. Neck supple.  Cardiovascular: Normal rate, regular rhythm, normal heart sounds and intact distal pulses.   Pulmonary/Chest: Effort normal and breath sounds normal.  Musculoskeletal: He exhibits no edema.  Neurological: He is alert.  Skin: Skin is warm and dry. No rash noted. No erythema.  Psychiatric: He has a normal mood and affect.   Lab Results  Component Value Date   HGBA1C 5.50 11/27/2014   CBG 109 EKG: normal EKG, normal sinus rhythm, LAd. HR 84  Depression screen [facility] 2/9 05/11/2015 08/21/2014 06/28/2014  Decreased Interest 1 0 0  Down, Depressed, Hopeless 1 0 0  PHQ - 2 Score 2 0 0  Altered sleeping 0 - -  Tired, decreased energy 2 - -  Change in appetite 0 - -  Feeling bad or failure about yourself  2 - -  Trouble concentrating 2 - -  Moving  slowly or fidgety/restless 2 - -  Suicidal thoughts 0 - -  PHQ-9 Score 10 - -    GAD 7 : Generalized Anxiety Score 05/11/2015  Nervous, Anxious, on Edge 3  Control/stop worrying 3  Worry too much - different things 3  Trouble relaxing 3  Restless 3  Easily annoyed or irritable 3  Afraid - awful might happen 1  Total GAD 7 Score 19      Assessment & Plan:  [name] was seen today for chest pain and anxiety.  Diagnoses and all orders for this visit:  Essential hypertension -     losartan (COZAAR) 50 MG tablet; Take 1 tablet (50 mg total) by mouth daily. -     carvedilol (COREG) 6.25 MG tablet; Take 1 tablet (6.25 mg total) by mouth 2 (two) times daily with a meal.  Dizzy -     HgB A1c -     Glucose (CBG) -     Vitamin D, 25-hydroxy -     Basic Metabolic Panel -     CBC -     TSH  HLD (hyperlipidemia) -     atorvastatin (LIPITOR) 20 MG tablet; Take 1 tablet (20 mg total) by mouth daily.  Callus of foot -     Ambulatory referral to Podiatry   Follow-up: No Follow-up on file.   Boykin Nearing MD

## 2015-05-11 NOTE — Assessment & Plan Note (Signed)
Normal exam NSR on EKG Normal A1c and CBG  Suspect anxiety Labs per orders

## 2015-05-11 NOTE — Patient Instructions (Addendum)
Juan Kline was seen today for chest pain and anxiety.  Diagnoses and all orders for this visit:  Essential hypertension -     losartan (COZAAR) 50 MG tablet; Take 1 tablet (50 mg total) by mouth daily. -     carvedilol (COREG) 6.25 MG tablet; Take 1 tablet (6.25 mg total) by mouth 2 (two) times daily with a meal.  Dizzy -     HgB A1c -     Glucose (CBG) -     Vitamin D, 25-hydroxy -     Basic Metabolic Panel -     CBC -     TSH  HLD (hyperlipidemia) -     atorvastatin (LIPITOR) 20 MG tablet; Take 1 tablet (20 mg total) by mouth daily.  Callus of foot -     Ambulatory referral to Podiatry   You A1c and blood sugar are normal/  Please increase losartan and add coreg for BP control. Coreg will also help control anxiety symptoms. You will feel tired at first.   I recommend that you sign into mychart for lab results  F/u in 2 weeks with pharmacist for BP check  F/u with me in 2 months for HTN   Dr. Adrian Blackwater

## 2015-05-11 NOTE — Progress Notes (Signed)
F/U dizzy and unbalance  Chest pain and anxiety  x 2 weeks  No pain today  Former smoker- no tobacco x 14 month ago  No suicidal thought in the past two weeks

## 2015-05-11 NOTE — Assessment & Plan Note (Signed)
A; BP elevated with anxiety symptoms P: Increase losartan to 50 mg daily Add coreg 6.25 mg BID

## 2015-05-11 NOTE — Assessment & Plan Note (Signed)
Anxiety suspected as underlying cause of most symptoms Internal referral to CSW made

## 2015-05-11 NOTE — Progress Notes (Signed)
ASSESSMENT: Pt currently experiencing generalized anxiety disorder, needs to f/u with PCP and [facility]; would benefit from psychiatry, as well as psychoeducation and brief therapeutic interventions to cope with symptoms of anxiety. Stage of Change: contemplative  PLAN: 1. F/U with behavioral health consultant in 2 weeks 2. Psychiatric Medications: none 3. Behavioral recommendation(s):   -Consider reading educational materials regarding coping with symtoms of anxiety and panic attacks -Consider spending moments of relaxation breathing, as needed, throughout the day -Consider [place] [facility]   SUBJECTIVE: Pt. referred by Dr Adrian Blackwater for symptoms of anxiety and panic attack:  Pt. reports the following symptoms/concerns: Pt states he feels anxious "out of nowhere", sometimes when he is driving; he thinks he is having panic attacks, easily irritated and feels bad about it later, says he is concerned about blood pressure being too high, life stressors causing him to feel more "stressed and anxious", difficult to relax. Duration of problem: At least one month Severity: severe  OBJECTIVE: Orientation & Cognition: Oriented x3. Thought processes normal and appropriate to situation. Mood: appropriate. Affect: appropriate Appearance: appropriate Risk of harm to self or others: no known risk of harm to self or others Substance use: none Assessments administered: PHQ9: 10/ GAD7: 19  Diagnosis: Generalized anxiety disorder CPT Code: F41.1 -------------------------------------------- Other(s) present in the room: none  Time spent with patient in exam room: 30 minutes, 11:30pm-12:00pm

## 2015-05-12 LAB — BASIC METABOLIC PANEL
BUN: 17 mg/dL (ref 7–25)
CALCIUM: 9.6 mg/dL (ref 8.6–10.3)
CO2: 27 mmol/L (ref 20–31)
Chloride: 104 mmol/L (ref 98–110)
Creat: 0.73 mg/dL (ref 0.60–1.35)
GLUCOSE: 106 mg/dL — AB (ref 65–99)
Potassium: 5.3 mmol/L (ref 3.5–5.3)
SODIUM: 143 mmol/L (ref 135–146)

## 2015-05-13 LAB — VITAMIN D 25 HYDROXY (VIT D DEFICIENCY, FRACTURES): Vit D, 25-Hydroxy: 30 ng/mL (ref 30–100)

## 2015-05-18 ENCOUNTER — Telehealth: Payer: Self-pay | Admitting: *Deleted

## 2015-05-18 NOTE — Telephone Encounter (Signed)
Date of birth verified by pt Normal lab results given  Pt verbalized understanding  

## 2015-05-18 NOTE — Telephone Encounter (Signed)
-----   Message from Boykin Nearing, MD sent at 05/14/2015  8:14 AM EST ----- All labs normal Patient's mychart pending  Encourage patient to sign up for mychart to receive results

## 2015-05-21 ENCOUNTER — Ambulatory Visit: Payer: Self-pay | Attending: Podiatry

## 2015-05-30 ENCOUNTER — Ambulatory Visit: Payer: Self-pay | Attending: Family Medicine | Admitting: Pharmacist

## 2015-05-30 ENCOUNTER — Encounter: Payer: Self-pay | Admitting: Pharmacist

## 2015-05-30 ENCOUNTER — Telehealth: Payer: Self-pay | Admitting: Family Medicine

## 2015-05-30 VITALS — BP 132/86 | HR 66

## 2015-05-30 DIAGNOSIS — Z79899 Other long term (current) drug therapy: Secondary | ICD-10-CM | POA: Insufficient documentation

## 2015-05-30 DIAGNOSIS — I10 Essential (primary) hypertension: Secondary | ICD-10-CM | POA: Insufficient documentation

## 2015-05-30 NOTE — Telephone Encounter (Signed)
Pt. Met with pharmacist and was advise to schedule appt. As soon as possible to possibly stop taking Lyrica. Per pharmacist Bp looks much better..... PCP's first available appt. Is in March....please follow up with patient

## 2015-05-30 NOTE — Patient Instructions (Signed)
Blood pressure looks much better - continue your medications.  Follow up with Dr. Adrian Blackwater about stopping the Lyrica - I am worried it will make you will worse and more anxious if you stop it

## 2015-05-30 NOTE — Progress Notes (Signed)
S:    Patient arrives in good spirits.    Presents to the clinic for hypertension evaluation.   Patient reports adherence with medications.  Current BP Medications include:  Carvedilol 6.25 mg BID and losartan 50 mg daily   Patient would like to stop the Lyrica as he feels like it may be making his anxiety worse. He is not sure if it was getting worse on its own or if the Lyrica is causing it.  O:   Last 3 Office BP readings: BP Readings from Last 3 Encounters:  05/30/15 132/86  05/11/15 157/111  01/17/15 132/78    BMET    Component Value Date/Time   NA 143 05/11/2015 1126   K 5.3 05/11/2015 1126   CL 104 05/11/2015 1126   CO2 27 05/11/2015 1126   GLUCOSE 106* 05/11/2015 1126   BUN 17 05/11/2015 1126   CREATININE 0.73 05/11/2015 1126   CREATININE 0.76 03/03/2014 1520   CALCIUM 9.6 05/11/2015 1126   GFRNONAA >89 11/27/2014 1032   GFRNONAA >90 03/03/2014 1520   GFRAA >89 11/27/2014 1032   GFRAA >90 03/03/2014 1520    A/P: Hypertension currently controlled on current medications.  Continued losartan 50 mg daily and carvedilol 6.25 mg BID. Patient counseled on adherence and the importance of not missing any doses of medication.  Instructed patient to follow up with Dr. Adrian Blackwater about the Lyrica. Stopping the Lyrica could actually cause more anxiety so it may be worth considering a SSRI or SNRI in him to help with the anxiety symptoms. Discussed not stopping the Lyrica with the patient and the risks of stopping it until he has seen PCP. Patient to schedule follow up with Dr. Adrian Blackwater.   Results reviewed and written information provided.   Total time in face-to-face counseling 20 minutes.   F/U Clinic Visit with Dr. Adrian Blackwater

## 2015-05-31 NOTE — Telephone Encounter (Signed)
Patient advised to get first available appt In meantime, decrease lyrica to 75 mg once daily

## 2015-06-06 MED FILL — LOSARTAN POTASSIUM 50 MG TA: 50 | 30 days supply | Qty: 30 | Fill #1

## 2015-06-06 MED FILL — CARVEDILOL 6.25 MG TABLET: 6.25 | 30 days supply | Qty: 60 | Fill #1

## 2015-06-18 MED FILL — ?ATORVASTATIN 20 MG TABLET: 20 | 30 days supply | Qty: 30 | Fill #1

## 2015-06-19 ENCOUNTER — Encounter: Payer: Self-pay | Admitting: Family Medicine

## 2015-06-19 ENCOUNTER — Ambulatory Visit: Payer: Self-pay | Attending: Family Medicine | Admitting: Family Medicine

## 2015-06-19 VITALS — BP 135/91 | HR 70 | Temp 98.4°F | Resp 16 | Ht 69.0 in | Wt 260.0 lb

## 2015-06-19 DIAGNOSIS — R42 Dizziness and giddiness: Secondary | ICD-10-CM | POA: Insufficient documentation

## 2015-06-19 DIAGNOSIS — N481 Balanitis: Secondary | ICD-10-CM | POA: Insufficient documentation

## 2015-06-19 DIAGNOSIS — F411 Generalized anxiety disorder: Secondary | ICD-10-CM | POA: Insufficient documentation

## 2015-06-19 DIAGNOSIS — Z87891 Personal history of nicotine dependence: Secondary | ICD-10-CM | POA: Insufficient documentation

## 2015-06-19 DIAGNOSIS — R21 Rash and other nonspecific skin eruption: Secondary | ICD-10-CM | POA: Insufficient documentation

## 2015-06-19 DIAGNOSIS — Z79899 Other long term (current) drug therapy: Secondary | ICD-10-CM | POA: Insufficient documentation

## 2015-06-19 DIAGNOSIS — I10 Essential (primary) hypertension: Secondary | ICD-10-CM | POA: Insufficient documentation

## 2015-06-19 MED ORDER — CARVEDILOL 3.125 MG PO TABS: 3.1250 mg | ORAL_TABLET | Freq: Two times a day (BID) | ORAL | Status: DC

## 2015-06-19 MED ORDER — ESCITALOPRAM OXALATE 10 MG PO TABS: 10.0000 mg | ORAL_TABLET | Freq: Every day | ORAL | Status: DC

## 2015-06-19 MED ORDER — CLOTRIMAZOLE 1 % EX CREA: 1.0000 "application " | TOPICAL_CREAM | Freq: Two times a day (BID) | CUTANEOUS | Status: DC

## 2015-06-19 MED ORDER — LOSARTAN POTASSIUM 100 MG PO TABS: 50.0000 mg | ORAL_TABLET | Freq: Every day | ORAL | Status: DC

## 2015-06-19 MED ORDER — FLUCONAZOLE 150 MG PO TABS: 150.0000 mg | ORAL_TABLET | Freq: Once | ORAL | Status: DC

## 2015-06-19 MED FILL — FLUCONAZOLE 150 MG TABLET: 150 | 1 days supply | Qty: 1 | Fill #0

## 2015-06-19 MED FILL — ?ESCITALOPRAM 10 MG TABLET: 10 | 30 days supply | Qty: 30 | Fill #0

## 2015-06-19 MED FILL — CARVEDILOL 3.125 MG TABLET: 3.125 | 30 days supply | Qty: 60 | Fill #0

## 2015-06-19 MED FILL — CLOTRIMAZOLE 1% CREAM: 1 | 30 days supply | Qty: 28 | Fill #0

## 2015-06-19 NOTE — Progress Notes (Signed)
Medication management- Lyrica  C/C rash on [place] area.  Pt stated no new product. No pain today  No suicidal thoughts in the past two weeks

## 2015-06-19 NOTE — Patient Instructions (Addendum)
Juan Kline was seen today for rash and medication management.  Diagnoses and all orders for this visit:  Essential hypertension -     carvedilol (COREG) 3.125 MG tablet; Take 1 tablet (3.125 mg total) by mouth 2 (two) times daily with a meal. -     losartan (COZAAR) 100 MG tablet; Take 0.5 tablets (50 mg total) by mouth daily.  Anxiety state -     escitalopram (LEXAPRO) 10 MG tablet; Take 1 tablet (10 mg total) by mouth daily.  Balanitis -     Wound culture -     clotrimazole (LOTRIMIN) 1 % cream; Apply 1 application topically 2 (two) times daily. -     fluconazole (DIFLUCAN) 150 MG tablet; Take 1 tablet (150 mg total) by mouth once.    F/u in 4 weeks for anxiety and balanitis  Dr. Adrian Blackwater  Balanitis Balanitis is inflammation of the head of the penis (glans).  CAUSES  Balanitis has multiple causes, both infectious and noninfectious. Often balanitis is the result of poor personal hygiene, especially in uncircumcised males. Without adequate washing, viruses, bacteria, and yeast collect between the foreskin and the glans. This can cause an infection. Lack of air and irritation from a normal secretion called smegma contribute to the cause in uncircumcised males. Other causes include:  Chemical irritation from the use of certain soaps and shower gels (especially soaps with perfumes), condoms, personal lubricants, petroleum jelly, spermicides, and fabric conditioners.  Skin conditions, such as eczema, dermatitis, and psoriasis.  Allergies to drugs, such as tetracycline and sulfa.  Certain medical conditions, including liver cirrhosis, congestive heart failure, and kidney disease.  Morbid obesity. RISK FACTORS  Diabetes mellitus.  A tight foreskin that is difficult to pull back past the glans (phimosis).  Sex without the use of a condom. SIGNS AND SYMPTOMS  Symptoms may include:  Discharge coming from under the foreskin.  Tenderness.  Itching and inability to get an erection  (because of the pain).  Redness and a rash.  Sores on the glans and on the foreskin. DIAGNOSIS Diagnosis of balanitis is confirmed through a physical exam. TREATMENT The treatment is based on the cause of the balanitis. Treatment may include:  Frequent cleansing.  Keeping the glans and foreskin dry.  Use of medicines such as creams, pain medicines, antibiotics, or medicines to treat fungal infections.  Sitz baths. If the irritation has caused a scar on the foreskin that prevents easy retraction, a circumcision may be recommended.  HOME CARE INSTRUCTIONS  Sex should be avoided until the condition has cleared. MAKE SURE YOU:  Understand these instructions.  Will watch your condition.  Will get help right away if you are not doing well or get worse.   This information is not intended to replace advice given to you by your health care provider. Make sure you discuss any questions you have with your health care provider.   Document Released: 08/17/2008 Document Revised: 04/05/2013 Document Reviewed: 09/20/2012 Elsevier Interactive Patient Education [employer].

## 2015-06-19 NOTE — Progress Notes (Signed)
Subjective:  Patient ID: Juan Kline, male    DOB: [birth year]  Age: 45 y.o. MRN: [id number]  CC: Rash and Medication Management   HPI Juan Kline presents for    1. Anxiety: worsening since starting lyrica. No SI. lyrica has helped with numbness and tingling in his feet. He endorses tearfulness and irritability. He would like treatment for his anxiety.   2. Rash: penis rash on head of penis x 4-5 weeks. Mildly pruritic. No sex for 20 years. Uncircumcised. Has hx of inguinal intertrigo. Has pain with erections.   3. HTN: taking coreg and losartan. Endorsing dizziness and lightheadedness that is worse when rising from sitting or when bending over and standing up. No HA, CP or SOB.   Social History  Substance Use Topics  . Smoking status: Former Smoker -- 0.10 packs/day for 26 years    Types: Cigarettes    Quit date: 02/16/2014  . Smokeless tobacco: Never Used  . Alcohol Use: Yes     Comment: 03/02/2014 "I rarely drink"    Outpatient Prescriptions Prior to Visit  Medication Sig Dispense Refill  . atorvastatin (LIPITOR) 20 MG tablet Take 1 tablet (20 mg total) by mouth daily. 30 tablet 11  . carvedilol (COREG) 6.25 MG tablet Take 1 tablet (6.25 mg total) by mouth 2 (two) times daily with a meal. 60 tablet 3  . losartan (COZAAR) 50 MG tablet Take 1 tablet (50 mg total) by mouth daily. 30 tablet 3  . pregabalin (LYRICA) 75 MG capsule Take 1 capsule (75 mg total) by mouth 2 (two) times daily. 180 capsule 2   No facility-administered medications prior to visit.    ROS Review of Systems  Constitutional: Negative for fever, chills, fatigue and unexpected weight change.  Eyes: Negative for visual disturbance.  Respiratory: Negative for cough and shortness of breath.   Cardiovascular: Negative for chest pain, palpitations and leg swelling.  Gastrointestinal: Negative for nausea, vomiting, abdominal pain, diarrhea, constipation and blood in stool.  Endocrine: Negative for polydipsia,  polyphagia and polyuria.  Musculoskeletal: Negative for myalgias, back pain, arthralgias, gait problem and neck pain.  Skin: Positive for rash.  Allergic/Immunologic: Negative for immunocompromised state.  Neurological: Positive for dizziness.  Hematological: Negative for adenopathy. Does not bruise/bleed easily.  Psychiatric/Behavioral: Negative for suicidal ideas, sleep disturbance and dysphoric mood. The patient is nervous/anxious.     Objective:  BP 135/91 mmHg  Pulse 70  Temp(Src) 98.4 F (36.9 C) (Oral)  Resp 16  Ht 5\' 9"  (1.753 m)  Wt 260 lb (117.935 kg)  BMI 38.38 kg/m2  SpO2 97%  BP/Weight 06/19/2015 05/30/2015 Q000111Q  Systolic BP A999333 Q000111Q A999333  Diastolic BP 91 86 99991111  Wt. (Lbs) 260 - 262  BMI 38.38 - 38.67   Pulse Readings from Last 3 Encounters:  06/19/15 70  05/30/15 66  05/11/15 87    Physical Exam  Constitutional: He appears well-developed and well-nourished. No distress.  HENT:  Head: Normocephalic and atraumatic.  Neck: Normal range of motion. Neck supple.  Cardiovascular: Normal rate, regular rhythm, normal heart sounds and intact distal pulses.   Pulmonary/Chest: Effort normal and breath sounds normal.  Genitourinary: Testes normal.    Uncircumcised. Penile erythema and penile tenderness present. Discharge found.  Musculoskeletal: He exhibits no edema.  Neurological: He is alert.  Skin: Skin is warm and dry. No rash noted. No erythema.  Psychiatric: He has a normal mood and affect.   Lab Results  Component Value Date   HGBA1C 5.60  05/11/2015   Depression screen PHQ 2/9 06/19/2015 05/11/2015 08/21/2014 06/28/2014 03/08/2014  Decreased Interest 1 1 0 0 0  Down, Depressed, Hopeless 1 1 0 0 0  PHQ - 2 Score 2 2 0 0 0  Altered sleeping 1 0 - - -  Tired, decreased energy 1 2 - - -  Change in appetite 1 0 - - -  Feeling bad or failure about yourself  1 2 - - -  Trouble concentrating 1 2 - - -  Moving slowly or fidgety/restless 1 2 - - -  Suicidal  thoughts 2 0 - - -  PHQ-9 Score 10 10 - - -    GAD 7 : Generalized Anxiety Score 06/19/2015 05/11/2015  Nervous, Anxious, on Edge 2 3  Control/stop worrying 2 3  Worry too much - different things 2 3  Trouble relaxing 2 3  Restless 2 3  Easily annoyed or irritable 2 3  Afraid - awful might happen 2 1  Total GAD 7 Score 14 19      Assessment & Plan:   Camiren was seen today for rash and medication management.  Diagnoses and all orders for this visit:  Essential hypertension -     carvedilol (COREG) 3.125 MG tablet; Take 1 tablet (3.125 mg total) by mouth 2 (two) times daily with a meal. -     losartan (COZAAR) 100 MG tablet; Take 0.5 tablets (50 mg total) by mouth daily.  Anxiety state -     escitalopram (LEXAPRO) 10 MG tablet; Take 1 tablet (10 mg total) by mouth daily.  Balanitis -     Wound culture -     clotrimazole (LOTRIMIN) 1 % cream; Apply 1 application topically 2 (two) times daily. -     fluconazole (DIFLUCAN) 150 MG tablet; Take 1 tablet (150 mg total) by mouth once.   Follow-up: No Follow-up on file.   Boykin Nearing MD

## 2015-06-20 ENCOUNTER — Encounter: Payer: Self-pay | Admitting: Clinical

## 2015-06-20 ENCOUNTER — Telehealth: Payer: Self-pay | Admitting: *Deleted

## 2015-06-20 DIAGNOSIS — N481 Balanitis: Secondary | ICD-10-CM | POA: Insufficient documentation

## 2015-06-20 NOTE — Assessment & Plan Note (Signed)
A: balanitis suspect candida given satellite lesions and lack of exposure to STIs P: Cx Diflucan Lotrimin

## 2015-06-20 NOTE — Telephone Encounter (Signed)
-----   Message from Boykin Nearing, MD sent at 06/20/2015  2:41 PM EST ----- Wound cx with few cocci, bacteria  Continue current treatment plan

## 2015-06-20 NOTE — Telephone Encounter (Signed)
Date of birth verified by pt  Lab results given Pt verbalized understanding 

## 2015-06-20 NOTE — Assessment & Plan Note (Signed)
A; BP near goal with orthostatic symptoms P: Taper down coreg Increase losartan

## 2015-06-20 NOTE — Assessment & Plan Note (Signed)
A: anxiety, chronic P:  lexapro

## 2015-06-20 NOTE — Progress Notes (Signed)
Depression screen [facility] 2/9 06/19/2015 05/11/2015 08/21/2014 06/28/2014 03/08/2014  Decreased Interest 1 1 0 0 0  Down, Depressed, Hopeless 1 1 0 0 0  PHQ - 2 Score 2 2 0 0 0  Altered sleeping 1 0 - - -  Tired, decreased energy 1 2 - - -  Change in appetite 1 0 - - -  Feeling bad or failure about yourself  1 2 - - -  Trouble concentrating 1 2 - - -  Moving slowly or fidgety/restless 1 2 - - -  Suicidal thoughts 2 0 - - -  PHQ-9 Score 10 10 - - -   06-19-15 UPDATE: PHQ9: 8 (1,1,1,1,1,1,1,1,0), Note that pt does not have "thoughts that (he) would be better off dead or of hurting (himself) in some way", Score for that question is 0, not 2  GAD 7 : Generalized Anxiety Score 06/19/2015 05/11/2015  Nervous, Anxious, on Edge 2 3  Control/stop worrying 2 3  Worry too much - different things 2 3  Trouble relaxing 2 3  Restless 2 3  Easily annoyed or irritable 2 3  Afraid - awful might happen 2 1  Total GAD 7 Score 14 19

## 2015-06-22 ENCOUNTER — Encounter: Payer: Self-pay | Admitting: Family Medicine

## 2015-06-22 LAB — WOUND CULTURE
GRAM STAIN: NONE SEEN
Gram Stain: NONE SEEN
ORGANISM ID, BACTERIA: NO GROWTH

## 2015-07-11 MED FILL — LOSARTAN POTASSIUM 50 MG TA: 50 | 30 days supply | Qty: 30 | Fill #2

## 2015-07-17 MED FILL — ATORVASTATIN 20 MG TABLET: 20 | 30 days supply | Qty: 30 | Fill #2

## 2015-07-17 MED FILL — ?ESCITALOPRAM 10 MG TABLET: 10 | 30 days supply | Qty: 30 | Fill #1

## 2015-08-13 MED FILL — ATORVASTATIN 20 MG TABLET: 20 | 30 days supply | Qty: 30 | Fill #3

## 2015-08-13 MED FILL — ?ESCITALOPRAM 10 MG TABLET: 10 | 30 days supply | Qty: 30 | Fill #2

## 2015-08-13 MED FILL — LOSARTAN POTASSIUM 50 MG TA: 50 | 30 days supply | Qty: 30 | Fill #3

## 2015-08-30 ENCOUNTER — Other Ambulatory Visit: Payer: Self-pay | Admitting: Family Medicine

## 2015-09-11 ENCOUNTER — Other Ambulatory Visit: Payer: Self-pay | Admitting: Family Medicine

## 2015-09-11 MED FILL — LOSARTAN POTASSIUM 50 MG TA: 50 | 30 days supply | Qty: 30 | Fill #0

## 2015-09-11 MED FILL — ?ESCITALOPRAM 10 MG TABLET: 10 | 30 days supply | Qty: 30 | Fill #0

## 2015-09-11 MED FILL — ATORVASTATIN 20 MG TABLET: 20 | 30 days supply | Qty: 30 | Fill #4

## 2015-09-20 ENCOUNTER — Ambulatory Visit: Payer: Self-pay

## 2015-09-20 ENCOUNTER — Encounter: Payer: Self-pay | Admitting: Family Medicine

## 2015-09-20 ENCOUNTER — Ambulatory Visit: Payer: Self-pay | Attending: Family Medicine | Admitting: Family Medicine

## 2015-09-20 VITALS — BP 111/78 | HR 75 | Temp 98.2°F | Resp 18 | Ht 69.0 in | Wt 273.6 lb

## 2015-09-20 DIAGNOSIS — F1099 Alcohol use, unspecified with unspecified alcohol-induced disorder: Secondary | ICD-10-CM | POA: Insufficient documentation

## 2015-09-20 DIAGNOSIS — Z87891 Personal history of nicotine dependence: Secondary | ICD-10-CM | POA: Insufficient documentation

## 2015-09-20 DIAGNOSIS — R202 Paresthesia of skin: Secondary | ICD-10-CM

## 2015-09-20 DIAGNOSIS — Z79899 Other long term (current) drug therapy: Secondary | ICD-10-CM | POA: Insufficient documentation

## 2015-09-20 DIAGNOSIS — R209 Unspecified disturbances of skin sensation: Secondary | ICD-10-CM | POA: Insufficient documentation

## 2015-09-20 DIAGNOSIS — E785 Hyperlipidemia, unspecified: Secondary | ICD-10-CM | POA: Insufficient documentation

## 2015-09-20 DIAGNOSIS — F411 Generalized anxiety disorder: Secondary | ICD-10-CM

## 2015-09-20 DIAGNOSIS — I10 Essential (primary) hypertension: Secondary | ICD-10-CM | POA: Insufficient documentation

## 2015-09-20 MED ORDER — ESCITALOPRAM OXALATE 20 MG PO TABS: 10.0000 mg | ORAL_TABLET | Freq: Every day | ORAL | Status: DC

## 2015-09-20 MED ORDER — ESCITALOPRAM OXALATE 20 MG PO TABS: 20.0000 mg | ORAL_TABLET | Freq: Every day | ORAL | Status: DC

## 2015-09-20 MED ORDER — PREGABALIN 75 MG PO CAPS: 75.0000 mg | ORAL_CAPSULE | Freq: Every evening | ORAL | Status: DC | PRN

## 2015-09-20 NOTE — Progress Notes (Signed)
Subjective:  Patient ID: Juan Kline, male    DOB: 22-Feb-1971  Age: 45 y.o. MRN: [id number]  CC: Follow-up   HPI Juan Kline has HTN, HLD, anxiety he presents for   1. Anxiety: persistent. He feels like lyrica may be worsening his symptoms. He has also noticed weight gain. He has trouble sleeping. He has not tried OTC sleep aid.   Social History  Substance Use Topics  . Smoking status: Former Smoker -- 0.10 packs/day for 26 years    Types: Cigarettes    Quit date: 02/16/2014  . Smokeless tobacco: Never Used  . Alcohol Use: Yes     Comment: 03/02/2014 "I rarely drink"    Outpatient Prescriptions Prior to Visit  Medication Sig Dispense Refill  . atorvastatin (LIPITOR) 20 MG tablet Take 1 tablet (20 mg total) by mouth daily. 30 tablet 11  . carvedilol (COREG) 3.125 MG tablet Take 1 tablet (3.125 mg total) by mouth 2 (two) times daily with a meal. 60 tablet 0  . clotrimazole (LOTRIMIN) 1 % cream Apply 1 application topically 2 (two) times daily. 30 g 0  . escitalopram (LEXAPRO) 10 MG tablet TAKE 1 TABLET BY MOUTH DAILY 30 tablet 2  . losartan (COZAAR) 50 MG tablet TAKE 1 TABLET BY MOUTH DAILY. 30 tablet 2  . pregabalin (LYRICA) 75 MG capsule Take 1 capsule (75 mg total) by mouth 2 (two) times daily. 180 capsule 2  . fluconazole (DIFLUCAN) 150 MG tablet Take 1 tablet (150 mg total) by mouth once. 1 tablet 0   No facility-administered medications prior to visit.    ROS Review of Systems  Constitutional: Negative for fever, chills, fatigue and unexpected weight change.  Eyes: Negative for visual disturbance.  Respiratory: Negative for cough and shortness of breath.   Cardiovascular: Negative for chest pain, palpitations and leg swelling.  Gastrointestinal: Negative for nausea, vomiting, abdominal pain, diarrhea, constipation and blood in stool.  Endocrine: Negative for polydipsia, polyphagia and polyuria.  Musculoskeletal: Negative for myalgias, back pain, arthralgias, gait problem  and neck pain.  Skin: Negative for rash.  Allergic/Immunologic: Negative for immunocompromised state.  Neurological: Positive for dizziness.  Hematological: Negative for adenopathy. Does not bruise/bleed easily.  Psychiatric/Behavioral: Negative for suicidal ideas, sleep disturbance and dysphoric mood. The patient is nervous/anxious.     Objective:  BP 111/78 mmHg  Pulse 75  Temp(Src) 98.2 F (36.8 C) (Oral)  Resp 18  Ht 5\' 9"  (1.753 m)  Wt 273 lb 9.6 oz (124.104 kg)  BMI 40.39 kg/m2  SpO2 96%  BP/Weight 09/20/2015 06/19/2015 Q000111Q  Systolic BP 99991111 A999333 Q000111Q  Diastolic BP 78 91 86  Wt. (Lbs) 259 260 -  BMI 38.23 38.38 -   Pulse Readings from Last 3 Encounters:  09/20/15 75  06/19/15 70  05/30/15 66    Physical Exam  Constitutional: He appears well-developed and well-nourished. No distress.  HENT:  Head: Normocephalic and atraumatic.  Neck: Normal range of motion. Neck supple.  Cardiovascular: Normal rate, regular rhythm, normal heart sounds and intact distal pulses.   Pulmonary/Chest: Effort normal and breath sounds normal.  Musculoskeletal: He exhibits no edema.  Neurological: He is alert.  Skin: Skin is warm and dry. No rash noted. No erythema.  Psychiatric: He has a normal mood and affect.   Depression screen [facility] 2/9 09/20/2015 06/19/2015 05/11/2015 08/21/2014 06/28/2014  Decreased Interest 3 1 1  0 0  Down, Depressed, Hopeless 2 1 1  0 0  PHQ - 2 Score 5 2 2  0  0  Altered sleeping 3 1 0 - -  Tired, decreased energy 3 1 2  - -  Change in appetite 2 1 0 - -  Feeling bad or failure about yourself  2 1 2  - -  Trouble concentrating 3 1 2  - -  Moving slowly or fidgety/restless 1 1 2  - -  Suicidal thoughts 0 2 0 - -  PHQ-9 Score 19 10 10  - -   GAD 7 : Generalized Anxiety Score 09/20/2015 06/19/2015 05/11/2015  Nervous, Anxious, on Edge 3 2 3   Control/stop worrying 3 2 3   Worry too much - different things 3 2 3   Trouble relaxing 3 2 3   Restless 1 2 3   Easily annoyed or  irritable 2 2 3   Afraid - awful might happen 1 2 1   Total GAD 7 Score 16 14 19        Assessment & Plan:   There are no diagnoses linked to this encounter. Juan Kline was seen today for follow-up.  Diagnoses and all orders for this visit:  Paresthesia of both feet -     pregabalin (LYRICA) 75 MG capsule; Take 1 capsule (75 mg total) by mouth at bedtime and may repeat dose one time if needed.  Anxiety state -     Discontinue: escitalopram (LEXAPRO) 20 MG tablet; Take 0.5 tablets (10 mg total) by mouth daily. -     escitalopram (LEXAPRO) 20 MG tablet; Take 1 tablet (20 mg total) by mouth daily.   Meds ordered this encounter  Medications  . pregabalin (LYRICA) 75 MG capsule    Sig: Take 1 capsule (75 mg total) by mouth at bedtime and may repeat dose one time if needed.    Dispense:  180 capsule    Refill:  2  . DISCONTD: escitalopram (LEXAPRO) 20 MG tablet    Sig: Take 0.5 tablets (10 mg total) by mouth daily.    Dispense:  30 tablet    Refill:  3  . escitalopram (LEXAPRO) 20 MG tablet    Sig: Take 1 tablet (20 mg total) by mouth daily.    Dispense:  30 tablet    Refill:  3    Follow-up: No Follow-up on file.   Boykin Nearing MD

## 2015-09-20 NOTE — Progress Notes (Signed)
F/U anxiety and medication refills Pt stated has possible reaction to Lyrica  No pain today  Former smoker  No suicidal thoughts in the past two weeks

## 2015-09-20 NOTE — Patient Instructions (Addendum)
Xzavier was seen today for follow-up.  Diagnoses and all orders for this visit:  Paresthesia of both feet -     pregabalin (LYRICA) 75 MG capsule; Take 1 capsule (75 mg total) by mouth at bedtime and may repeat dose one time if needed.  Anxiety state -     Discontinue: escitalopram (LEXAPRO) 20 MG tablet; Take 0.5 tablets (10 mg total) by mouth daily. -     escitalopram (LEXAPRO) 20 MG tablet; Take 1 tablet (20 mg total) by mouth daily.   Try melatonin or benadryl to help you sleep  F/u in 6 weeks for weight check and anxiety and depression   Dr. Adrian Blackwater

## 2015-09-23 ENCOUNTER — Encounter: Payer: Self-pay | Admitting: Family Medicine

## 2015-09-23 NOTE — Assessment & Plan Note (Signed)
This is chronic Increase lexapro from 10 to 20 mg daily Encouraged patient seek counseling

## 2015-09-23 NOTE — Assessment & Plan Note (Signed)
Decreased lyric from BID to 75 mg once daily

## 2015-10-01 MED FILL — ?ESCITALOPRAM 10 MG TABLET: 10 | 30 days supply | Qty: 30 | Fill #1

## 2015-10-10 MED FILL — LOSARTAN POTASSIUM 50 MG TA: 50 | 30 days supply | Qty: 30 | Fill #1

## 2015-10-17 MED FILL — ?ATORVASTATIN 20 MG TABLET: 20 | 30 days supply | Qty: 30 | Fill #5

## 2015-10-19 MED FILL — ESCITALOPRAM 20 MG TABLET: 20 | 30 days supply | Qty: 30 | Fill #0

## 2015-11-12 MED FILL — LOSARTAN POTASSIUM 50 MG TA: 50 | 30 days supply | Qty: 30 | Fill #2

## 2015-11-12 MED FILL — ATORVASTATIN 20 MG TABLET: 20 | 30 days supply | Qty: 30 | Fill #6

## 2015-12-10 MED FILL — LOSARTAN POTASSIUM 50 MG TA: 50 | 30 days supply | Qty: 30 | Fill #3

## 2016-01-07 ENCOUNTER — Other Ambulatory Visit: Payer: Self-pay | Admitting: Family Medicine

## 2016-01-07 MED FILL — ?LOSARTAN POTASSIUM 50MG TA: 50 | 30 days supply | Qty: 30 | Fill #0

## 2016-02-06 ENCOUNTER — Telehealth: Payer: Self-pay | Admitting: Family Medicine

## 2016-02-06 MED ORDER — LOSARTAN POTASSIUM 50 MG PO TABS: 50.0000 mg | ORAL_TABLET | Freq: Every day | ORAL | 0 refills | Status: DC

## 2016-02-06 MED FILL — LOSARTAN POTASSIUM 50 MG TA: 50 | 30 days supply | Qty: 30 | Fill #0

## 2016-02-06 NOTE — Telephone Encounter (Signed)
Losartan refilled x 30 days to allow patient time to make an appointment with Dr. Adrian Blackwater.

## 2016-02-06 NOTE — Telephone Encounter (Signed)
Patient called requesting refills on losartan (COZAAR) 50 MG tablet . Is aware he needs an appointment. Please f/u.

## 2016-02-28 ENCOUNTER — Encounter: Payer: Self-pay | Admitting: Family Medicine

## 2016-02-28 ENCOUNTER — Ambulatory Visit: Payer: Self-pay | Attending: Family Medicine | Admitting: Family Medicine

## 2016-02-28 VITALS — BP 130/80 | HR 82 | Temp 97.7°F | Ht 69.0 in | Wt 280.0 lb

## 2016-02-28 DIAGNOSIS — R42 Dizziness and giddiness: Secondary | ICD-10-CM | POA: Insufficient documentation

## 2016-02-28 DIAGNOSIS — E7800 Pure hypercholesterolemia, unspecified: Secondary | ICD-10-CM | POA: Insufficient documentation

## 2016-02-28 DIAGNOSIS — H9313 Tinnitus, bilateral: Secondary | ICD-10-CM | POA: Insufficient documentation

## 2016-02-28 DIAGNOSIS — Z87891 Personal history of nicotine dependence: Secondary | ICD-10-CM | POA: Insufficient documentation

## 2016-02-28 DIAGNOSIS — F419 Anxiety disorder, unspecified: Secondary | ICD-10-CM | POA: Insufficient documentation

## 2016-02-28 DIAGNOSIS — E559 Vitamin D deficiency, unspecified: Secondary | ICD-10-CM | POA: Insufficient documentation

## 2016-02-28 DIAGNOSIS — E785 Hyperlipidemia, unspecified: Secondary | ICD-10-CM | POA: Insufficient documentation

## 2016-02-28 DIAGNOSIS — Z23 Encounter for immunization: Secondary | ICD-10-CM

## 2016-02-28 DIAGNOSIS — I10 Essential (primary) hypertension: Secondary | ICD-10-CM | POA: Insufficient documentation

## 2016-02-28 LAB — BASIC METABOLIC PANEL WITH GFR
BUN: 18 mg/dL (ref 7–25)
CHLORIDE: 102 mmol/L (ref 98–110)
CO2: 28 mmol/L (ref 20–31)
Calcium: 9.4 mg/dL (ref 8.6–10.3)
Creat: 0.7 mg/dL (ref 0.60–1.35)
GLUCOSE: 110 mg/dL — AB (ref 65–99)
POTASSIUM: 4.3 mmol/L (ref 3.5–5.3)
Sodium: 138 mmol/L (ref 135–146)

## 2016-02-28 LAB — LIPID PANEL
CHOL/HDL RATIO: 5.6 ratio — AB (ref <5.0)
CHOLESTEROL: 230 mg/dL — AB (ref <200)
HDL: 41 mg/dL (ref >40)
LDL Cholesterol: 124 mg/dL — ABNORMAL HIGH (ref <100)
Triglycerides: 326 mg/dL — ABNORMAL HIGH (ref <150)
VLDL: 65 mg/dL — ABNORMAL HIGH (ref <30)

## 2016-02-28 LAB — CBC
HEMATOCRIT: 41.6 % (ref 38.5–50.0)
HEMOGLOBIN: 14.5 g/dL (ref 13.2–17.1)
MCH: 31 pg (ref 27.0–33.0)
MCHC: 34.9 g/dL (ref 32.0–36.0)
MCV: 89.1 fL (ref 80.0–100.0)
MPV: 10 fL (ref 7.5–12.5)
Platelets: 228 10*3/uL (ref 140–400)
RBC: 4.67 MIL/uL (ref 4.20–5.80)
RDW: 13.7 % (ref 11.0–15.0)
WBC: 8.8 10*3/uL (ref 3.8–10.8)

## 2016-02-28 MED ORDER — LOSARTAN POTASSIUM 50 MG PO TABS: 50.0000 mg | ORAL_TABLET | Freq: Every day | ORAL | 11 refills | Status: DC

## 2016-02-28 NOTE — Assessment & Plan Note (Signed)
HTN controlled Med compliant Continue losartan 50 mg daily

## 2016-02-28 NOTE — Progress Notes (Signed)
Pt is here today to follow up on HTN. Pt needs refills on BP medication.  Pt is getting flu shot today.

## 2016-02-28 NOTE — Assessment & Plan Note (Signed)
Intermittent dizziness Check CBC, vit D, audiology referral placed

## 2016-02-28 NOTE — Progress Notes (Signed)
Subjective:  Patient ID: Juan Kline, male    DOB: 1970/08/06  Age: 45 y.o. MRN: [id number]  CC: Hypertension   HPI Juan Kline has HTN, HLD, anxiety he presents for   1. CHRONIC HYPERTENSION  Disease Monitoring  Blood pressure range: not checking   Chest pain: no   Dyspnea: no   Claudication: no   Medication compliance: yes  Medication Side Effects  Lightheadedness: yes, at times    Urinary frequency: no   Edema: no    2. Dizziness: still occurs. Comes and goes. Exacerbated by standing up too quickly. No room spinning. Has tinnitus. No hearing loss.    Social History  Substance Use Topics  . Smoking status: Former Smoker    Packs/day: 0.10    Years: 26.00    Types: Cigarettes    Quit date: 02/16/2014  . Smokeless tobacco: Never Used  . Alcohol use Yes     Comment: 03/02/2014 "I rarely drink"    Outpatient Medications Prior to Visit  Medication Sig Dispense Refill  . losartan (COZAAR) 50 MG tablet Take 1 tablet (50 mg total) by mouth daily. 30 tablet 0  . atorvastatin (LIPITOR) 20 MG tablet Take 1 tablet (20 mg total) by mouth daily. (Patient not taking: Reported on 02/28/2016) 30 tablet 11  . clotrimazole (LOTRIMIN) 1 % cream Apply 1 application topically 2 (two) times daily. (Patient not taking: Reported on 02/28/2016) 30 g 0  . escitalopram (LEXAPRO) 20 MG tablet Take 1 tablet (20 mg total) by mouth daily. (Patient not taking: Reported on 02/28/2016) 30 tablet 3  . pregabalin (LYRICA) 75 MG capsule Take 1 capsule (75 mg total) by mouth at bedtime and may repeat dose one time if needed. (Patient not taking: Reported on 02/28/2016) 180 capsule 2   No facility-administered medications prior to visit.     ROS Review of Systems  Constitutional: Negative for chills, fatigue, fever and unexpected weight change.  HENT: Positive for tinnitus.   Eyes: Negative for visual disturbance.  Respiratory: Negative for cough and shortness of breath.   Cardiovascular: Negative  for chest pain, palpitations and leg swelling.  Gastrointestinal: Negative for abdominal pain, blood in stool, constipation, diarrhea, nausea and vomiting.  Endocrine: Negative for polydipsia, polyphagia and polyuria.  Musculoskeletal: Negative for arthralgias, back pain, gait problem, myalgias and neck pain.  Skin: Negative for rash.  Allergic/Immunologic: Negative for immunocompromised state.  Neurological: Positive for dizziness.  Hematological: Negative for adenopathy. Does not bruise/bleed easily.  Psychiatric/Behavioral: Negative for dysphoric mood, sleep disturbance and suicidal ideas. The patient is not nervous/anxious.     Objective:  BP 130/80 (BP Location: Left Arm, Patient Position: Sitting, Cuff Size: Small)   Pulse 82   Temp 97.7 F (36.5 C) (Oral)   Ht 5\' 9"  (1.753 m)   Wt 280 lb (127 kg)   SpO2 94%   BMI 41.35 kg/m   BP/Weight 02/28/2016 A999333 [id number]  Systolic BP [id number] A999333  Diastolic BP 80 78 91  Wt. (Lbs) 280 273.6 260  BMI 41.35 40.39 38.38   Pulse Readings from Last 3 Encounters:  02/28/16 82  09/20/15 75  06/19/15 70    Physical Exam  Constitutional: He appears well-developed and well-nourished. No distress.  HENT:  Head: Normocephalic and atraumatic.  Right Ear: Tympanic membrane, external ear and ear canal normal.  Left Ear: Tympanic membrane, external ear and ear canal normal.  Neck: Normal range of motion. Neck supple.  Cardiovascular: Normal rate, regular rhythm, normal heart sounds  and intact distal pulses.   Pulmonary/Chest: Effort normal and breath sounds normal.  Musculoskeletal: He exhibits no edema.  Neurological: He is alert.  Skin: Skin is warm and dry. No rash noted. No erythema.  Psychiatric: He has a normal mood and affect.   Depression screen [facility] 2/9 02/28/2016 09/20/2015 06/19/2015 05/11/2015 08/21/2014  Decreased Interest 1 3 1 1  0  Down, Depressed, Hopeless 0 2 1 1  0  PHQ - 2 Score 1 5 2 2  0  Altered sleeping 3 3 1  0 -    Tired, decreased energy 2 3 1 2  -  Change in appetite 1 2 1  0 -  Feeling bad or failure about yourself  1 2 1 2  -  Trouble concentrating 1 3 1 2  -  Moving slowly or fidgety/restless 1 1 1 2  -  Suicidal thoughts 0 0 2 0 -  PHQ-9 Score 10 19 10 10  -   GAD 7 : Generalized Anxiety Score 02/28/2016 09/20/2015 06/19/2015 05/11/2015  Nervous, Anxious, on Edge 1 3 2 3   Control/stop worrying 1 3 2 3   Worry too much - different things 1 3 2 3   Trouble relaxing 1 3 2 3   Restless 1 1 2 3   Easily annoyed or irritable 1 2 2 3   Afraid - awful might happen 1 1 2 1   Total GAD 7 Score 7 16 14 19      Assessment & Plan:   There are no diagnoses linked to this encounter. Juan Kline was seen today for hypertension.  Diagnoses and all orders for this visit:  Essential hypertension -     BASIC METABOLIC PANEL WITH GFR -     losartan (COZAAR) 50 MG tablet; Take 1 tablet (50 mg total) by mouth daily.  Pure hypercholesterolemia -     Lipid Panel  Vitamin D insufficiency -     Vitamin D, 25-hydroxy  Dizzy -     Vitamin D, 25-hydroxy -     CBC -     Ambulatory referral to Audiology  Tinnitus of both ears -     Ambulatory referral to Audiology   No orders of the defined types were placed in this encounter.   Follow-up: Return in about 6 months (around 08/27/2016) for HTN .   Boykin Nearing MD

## 2016-02-28 NOTE — Patient Instructions (Addendum)
Juan Kline was seen today for hypertension.  Diagnoses and all orders for this visit:  Essential hypertension -     BASIC METABOLIC PANEL WITH GFR -     losartan (COZAAR) 50 MG tablet; Take 1 tablet (50 mg total) by mouth daily.  Pure hypercholesterolemia -     Lipid Panel  Vitamin D insufficiency -     Vitamin D, 25-hydroxy  Dizzy -     Vitamin D, 25-hydroxy -     CBC -     Ambulatory referral to Audiology  Tinnitus of both ears -     Ambulatory referral to Audiology   F/u in 6 months for HTN   Dr. Adrian Blackwater

## 2016-02-29 ENCOUNTER — Telehealth: Payer: Self-pay

## 2016-02-29 LAB — VITAMIN D 25 HYDROXY (VIT D DEFICIENCY, FRACTURES): Vit D, 25-Hydroxy: 35 ng/mL (ref 30–100)

## 2016-02-29 NOTE — Telephone Encounter (Signed)
Pt was called and informed of lab results. 

## 2016-03-10 MED FILL — ?LOSARTAN POTASSIUM 50MG TA: 50 | 30 days supply | Qty: 30 | Fill #0

## 2016-03-19 ENCOUNTER — Ambulatory Visit: Payer: Self-pay | Attending: Family Medicine

## 2016-04-09 MED FILL — LOSARTAN POTASSIUM 50 MG TA: 50 | 30 days supply | Qty: 30 | Fill #1

## 2016-05-12 MED FILL — LOSARTAN POTASSIUM 50 MG TA: 50 | 30 days supply | Qty: 30 | Fill #2

## 2016-05-26 ENCOUNTER — Ambulatory Visit: Payer: Self-pay | Attending: Family Medicine | Admitting: Audiology

## 2016-06-11 MED FILL — LOSARTAN POTASSIUM 50 MG TA: 50 | 30 days supply | Qty: 30 | Fill #3

## 2016-07-14 MED FILL — LOSARTAN POTASSIUM 50 MG TA: 50 | 30 days supply | Qty: 30 | Fill #4

## 2016-07-31 ENCOUNTER — Encounter: Payer: Self-pay | Admitting: Family Medicine

## 2016-07-31 ENCOUNTER — Ambulatory Visit: Payer: Self-pay | Attending: Family Medicine | Admitting: Family Medicine

## 2016-07-31 VITALS — BP 135/90 | HR 81 | Temp 97.7°F | Ht 69.0 in | Wt 285.0 lb

## 2016-07-31 DIAGNOSIS — N481 Balanitis: Secondary | ICD-10-CM | POA: Insufficient documentation

## 2016-07-31 DIAGNOSIS — L304 Erythema intertrigo: Secondary | ICD-10-CM | POA: Insufficient documentation

## 2016-07-31 DIAGNOSIS — R21 Rash and other nonspecific skin eruption: Secondary | ICD-10-CM | POA: Insufficient documentation

## 2016-07-31 DIAGNOSIS — Z113 Encounter for screening for infections with a predominantly sexual mode of transmission: Secondary | ICD-10-CM

## 2016-07-31 DIAGNOSIS — Z87891 Personal history of nicotine dependence: Secondary | ICD-10-CM | POA: Insufficient documentation

## 2016-07-31 MED ORDER — HYDROCORTISONE 1 % EX CREA: 1.0000 "application " | TOPICAL_CREAM | Freq: Two times a day (BID) | CUTANEOUS | 0 refills | Status: DC

## 2016-07-31 NOTE — Progress Notes (Signed)
Subjective:  Patient ID: Juan Kline, male    DOB: 01/09/1971  Age: 46 y.o. MRN: [id number]  CC: No chief complaint on file.   HPI Juan Kline presents for    1. Rash: red penis rash on head of penis over 1 year with very slight improvement with topical lotrimin 1% cream and oral diflucan.   Mildly pruritic. There is associated clear discharge with slight odor.  No sex for 21 years. Uncircumcised. Has hx of inguinal intertrigo. Has pain with erections.    Social History  Substance Use Topics  . Smoking status: Former Smoker    Packs/day: 0.10    Years: 26.00    Types: Cigarettes    Quit date: 02/16/2014  . Smokeless tobacco: Never Used  . Alcohol use Yes     Comment: 03/02/2014 "I rarely drink"    Outpatient Medications Prior to Visit  Medication Sig Dispense Refill  . losartan (COZAAR) 50 MG tablet Take 1 tablet (50 mg total) by mouth daily. 30 tablet 11   No facility-administered medications prior to visit.     ROS Review of Systems  Constitutional: Negative for chills, fatigue, fever and unexpected weight change.  Eyes: Negative for visual disturbance.  Respiratory: Negative for cough and shortness of breath.   Cardiovascular: Negative for chest pain, palpitations and leg swelling.  Gastrointestinal: Negative for abdominal pain, blood in stool, constipation, diarrhea, nausea and vomiting.  Endocrine: Negative for polydipsia, polyphagia and polyuria.  Musculoskeletal: Negative for arthralgias, back pain, gait problem, myalgias and neck pain.  Skin: Positive for rash.  Allergic/Immunologic: Negative for immunocompromised state.  Neurological: Positive for dizziness.  Hematological: Negative for adenopathy. Does not bruise/bleed easily.  Psychiatric/Behavioral: Negative for dysphoric mood, sleep disturbance and suicidal ideas. The patient is nervous/anxious.     Objective:  BP 135/90   Pulse 81   Temp 97.7 F (36.5 C) (Oral)   Ht 5\' 9"  (1.753 m)   Wt 285 lb (129.3  kg)   SpO2 94%   BMI 42.09 kg/m   BP/Weight 07/31/2016 03/47/4259 08/17/3873  Systolic BP 643 329 518  Diastolic BP 90 80 78  Wt. (Lbs) 285 280 273.6  BMI 42.09 41.35 40.39   Pulse Readings from Last 3 Encounters:  07/31/16 81  02/28/16 82  09/20/15 75    Physical Exam  Constitutional: He appears well-developed and well-nourished. No distress.  HENT:  Head: Normocephalic and atraumatic.  Neck: Normal range of motion. Neck supple.  Cardiovascular: Normal rate, regular rhythm, normal heart sounds and intact distal pulses.   Pulmonary/Chest: Effort normal and breath sounds normal.  Genitourinary: Testes normal.    Uncircumcised. Penile erythema and penile tenderness present. Discharge found.  Musculoskeletal: He exhibits no edema.  Neurological: He is alert.  Skin: Skin is warm and dry. No rash noted. No erythema.  Psychiatric: He has a normal mood and affect.   Lab Results  Component Value Date   HGBA1C 5.60 05/11/2015   Depression screen [facility] 2/9 02/28/2016 09/20/2015 06/19/2015 05/11/2015 08/21/2014  Decreased Interest 1 3 1 1  0  Down, Depressed, Hopeless 0 2 1 1  0  PHQ - 2 Score 1 5 2 2  0  Altered sleeping 3 3 1  0 -  Tired, decreased energy 2 3 1 2  -  Change in appetite 1 2 1  0 -  Feeling bad or failure about yourself  1 2 1 2  -  Trouble concentrating 1 3 1 2  -  Moving slowly or fidgety/restless 1 1 1 2  -  Suicidal thoughts 0 0 2 0 -  PHQ-9 Score 10 19 10 10  -    GAD 7 : Generalized Anxiety Score 02/28/2016 09/20/2015 06/19/2015 05/11/2015  Nervous, Anxious, on Edge 1 3 2 3   Control/stop worrying 1 3 2 3   Worry too much - different things 1 3 2 3   Trouble relaxing 1 3 2 3   Restless 1 1 2 3   Easily annoyed or irritable 1 2 2 3   Afraid - awful might happen 1 1 2 1   Total GAD 7 Score 7 16 14 19     Lab Results  Component Value Date   HGBA1C 5.60 05/11/2015     Assessment & Plan:   Juan Kline was seen today for rash.  Diagnoses and all orders for this  visit:  Balanitis -     Ambulatory referral to Urology -     hydrocortisone cream 1 %; Apply 1 application topically 2 (two) times daily. To head of penis for 7 days  Routine screening for STI (sexually transmitted infection) -     RPR -     HIV antibody (with reflex)   Follow-up: No Follow-up on file.   Boykin Nearing MD

## 2016-07-31 NOTE — Assessment & Plan Note (Signed)
A: balanitis unresponsive to topical antifungal P:  Wash saline solution and apply hydrocortisone cream twice daily for 7 days

## 2016-07-31 NOTE — Progress Notes (Signed)
Pt is having redness and discomfort around head of penis.

## 2016-07-31 NOTE — Patient Instructions (Addendum)
Juan Kline was seen today for rash.  Diagnoses and all orders for this visit:  Balanitis -     Ambulatory referral to Urology -     hydrocortisone cream 1 %; Apply 1 application topically 2 (two) times daily. To head of penis for 7 days  Routine screening for STI (sexually transmitted infection) -     RPR -     HIV antibody (with reflex)   Wash saline solution and apply hydrocortisone cream twice daily for 7 days  f/u in 2 weeks for balanitis  Dr. Adrian Blackwater    Balanitis Balanitis is swelling and irritation (inflammation) of the head of the penis (glans penis). The condition may also cause inflammation of the skin around the glans penis (foreskin) in men who have not been circumcised. It may develop because of an infection or another medical condition. Balanitis occurs most often among men who have not had their foreskin removed (uncircumcised men). Balanitis sometimes causes scarring of the penis or foreskin, which can require surgery. Untreated balanitis can increase the risk of penile cancer. What are the causes? Common causes of this condition include:  Poor personal hygiene, especially in uncircumcised men. Not cleaning the glans penis and foreskin well can result in buildup of bacteria, viruses, and yeast, which can lead to infection and inflammation.  Irritation and lack of air flow due to fluid (smegma) that can build up on the glans penis. Other causes include:  Chemical irritation from products such as soaps or shower gels (especially those that have fragrance), condoms, personal lubricants, petroleum jelly, spermicides, or fabric softeners.  Skin conditions, such as eczema, dermatitis, and psoriasis.  Allergies to medicines, such as tetracycline and sulfa drugs.  Certain medical conditions, including liver cirrhosis, congestive heart failure, diabetes, and kidney disease.  Infections, such as candidiasis, HPV (human papillomavirus), herpes simplex, gonorrhea, and  syphilis.  Severe obesity. What increases the risk? The following factors may make you more likely to develop this condition:  Having diabetes. This is the most common risk factor.  Having a tight foreskin that is difficult to pull back (retract) past the glans.  Having sexual intercourse without using a condom. What are the signs or symptoms? Symptoms of this condition include:  Discharge from under the foreskin.  A bad smell.  Pain or difficulty retracting the foreskin.  Tenderness, redness, and swelling of the glans.  A rash or sores on the glans or foreskin.  Itchiness.  Inability to get an erection due to pain.  Difficulty urinating.  Scarring of the penis or foreskin, in some cases. How is this diagnosed? This condition may be diagnosed based on:  A physical exam.  Testing a swab of discharge to check for bacterial or fungal infection.  Blood tests:  To check for viruses that can cause balanitis.  To check your blood sugar (glucose) level. High blood glucose could be a sign of diabetes, which can cause balanitis. How is this treated? Treatment for balanitis depends on the cause. Treatment may include:  Improving personal hygiene. Your health care provider may recommend sitting in a bath of warm water that is deep enough to cover your hips and buttocks (sitz bath).  Medicines such as:  Creams or ointments to reduce swelling (steroids) or to treat an infection.  Antibiotic medicine.  Antifungal medicine.  Surgery to remove or cut the foreskin (circumcision). This may be done if you have scarring on the foreskin that makes it difficult to retract.  Controlling other medical problems that  may be causing your condition or making it worse. Follow these instructions at home:  Do not have sex until the condition clears up, or until your health care provider approves.  Keep your penis clean and dry. Take sitz baths as recommended by your health care  provider.  Avoid products that irritate your skin or make symptoms worse, such as soaps and shower gels that have fragrance.  Take over-the-counter and prescription medicines only as told by your health care provider.  If you were prescribed an antibiotic medicine or a cream or ointment, use it as told by your health care provider. Do not stop using your medicine, cream, or ointment even if you start to feel better.  Do not drive or use heavy machinery while taking prescription pain medicine. Contact a health care provider if:  Your symptoms get worse or do not improve with home care.  You develop chills or a fever.  You have trouble urinating.  You cannot retract your foreskin. Get help right away if:  You develop severe pain.  You are unable to urinate. Summary  Balanitis is inflammation of the head of the penis (glans penis) caused by irritation or infection.  Balanitis causes pain, redness, and swelling of the glans penis.  This condition is most common among uncircumcised men who do not keep their glans penis clean and in men who have diabetes.  Treatment may include creams or ointments.  Good hygiene is important for prevention. This includes pulling back the foreskin when washing your penis. This information is not intended to replace advice given to you by your health care provider. Make sure you discuss any questions you have with your health care provider. Document Released: 08/17/2008 Document Revised: 02/18/2016 Document Reviewed: 02/18/2016 Elsevier Interactive Patient Education  2017 [employer].

## 2016-08-01 LAB — RPR: RPR: NONREACTIVE

## 2016-08-01 LAB — HIV ANTIBODY (ROUTINE TESTING W REFLEX): HIV SCREEN 4TH GENERATION: NONREACTIVE

## 2016-08-06 ENCOUNTER — Telehealth: Payer: Self-pay

## 2016-08-06 NOTE — Telephone Encounter (Signed)
Pt was called and informed of lab results. 

## 2016-08-12 ENCOUNTER — Encounter: Payer: Self-pay | Admitting: Family Medicine

## 2016-08-12 ENCOUNTER — Ambulatory Visit: Payer: Self-pay | Attending: Family Medicine | Admitting: Family Medicine

## 2016-08-12 VITALS — BP 121/80 | HR 76 | Temp 97.7°F | Ht 69.0 in | Wt 282.6 lb

## 2016-08-12 DIAGNOSIS — N481 Balanitis: Secondary | ICD-10-CM | POA: Insufficient documentation

## 2016-08-12 DIAGNOSIS — F411 Generalized anxiety disorder: Secondary | ICD-10-CM | POA: Insufficient documentation

## 2016-08-12 DIAGNOSIS — Z87891 Personal history of nicotine dependence: Secondary | ICD-10-CM | POA: Insufficient documentation

## 2016-08-12 MED ORDER — HYDROCORTISONE 1 % EX CREA: 1.0000 "application " | TOPICAL_CREAM | Freq: Two times a day (BID) | CUTANEOUS | 0 refills | Status: DC

## 2016-08-12 MED ORDER — ESCITALOPRAM OXALATE 10 MG PO TABS: 10.0000 mg | ORAL_TABLET | Freq: Every day | ORAL | 5 refills | Status: DC

## 2016-08-12 MED FILL — ESCITALOPRAM 10 MG TABLET: 10 | 30 days supply | Qty: 30 | Fill #0

## 2016-08-12 NOTE — Assessment & Plan Note (Signed)
A: improved P: Continue hydrocortisone cream for one more week then as needed f/u in 6 weeks for recheck There is a shallow area of ulceration at 11 o'clock on glans that needs to be washed closely

## 2016-08-12 NOTE — Progress Notes (Signed)
Subjective:  Patient ID: Juan Kline, male    DOB: 1971-03-23  Age: 46 y.o. MRN: [id number]  CC: Follow-up   HPI Juan Kline presents for    1. Rash: red penis rash on head of penis over 1 year with very slight improvement with topical lotrimin 1% cream and oral diflucan.   Mildly pruritic. There is associated clear discharge with slight odor.  No sex for 21 years. Uncircumcised. Has hx of inguinal intertrigo. Has pain with erections.  He reports being pain free now, no itching. He reports discharge has improved with hydrocortisone cream 1%.   2. Anxiety: reports irritability. Symptoms have been present for may years. He took and tolerated lexapro last year. He reports his mother has reported irritable mood. He is isolating himself. Symptoms wax and wane. He is currently not working. He reports he sleeps well but has trouble relaxing and going to sleep. He sleeps 5-6 hrs a night.   Social History  Substance Use Topics  . Smoking status: Former Smoker    Packs/day: 0.10    Years: 26.00    Types: Cigarettes    Quit date: 02/16/2014  . Smokeless tobacco: Never Used  . Alcohol use Yes     Comment: 03/02/2014 "I rarely drink"    Outpatient Medications Prior to Visit  Medication Sig Dispense Refill  . hydrocortisone cream 1 % Apply 1 application topically 2 (two) times daily. To head of penis for 7 days 30 g 0  . losartan (COZAAR) 50 MG tablet Take 1 tablet (50 mg total) by mouth daily. 30 tablet 11   No facility-administered medications prior to visit.     ROS Review of Systems  Constitutional: Negative for chills, fatigue, fever and unexpected weight change.  Eyes: Negative for visual disturbance.  Respiratory: Negative for cough and shortness of breath.   Cardiovascular: Negative for chest pain, palpitations and leg swelling.  Gastrointestinal: Negative for abdominal pain, blood in stool, constipation, diarrhea, nausea and vomiting.  Endocrine: Negative for polydipsia,  polyphagia and polyuria.  Musculoskeletal: Negative for arthralgias, back pain, gait problem, myalgias and neck pain.  Skin: Positive for rash.  Allergic/Immunologic: Negative for immunocompromised state.  Neurological: Negative for dizziness.  Hematological: Negative for adenopathy. Does not bruise/bleed easily.  Psychiatric/Behavioral: Positive for sleep disturbance. Negative for dysphoric mood and suicidal ideas. The patient is nervous/anxious.     Objective:  BP 121/80   Pulse 76   Temp 97.7 F (36.5 C) (Oral)   Ht 5\' 9"  (1.753 m)   Wt 282 lb 9.6 oz (128.2 kg)   SpO2 96%   BMI 41.73 kg/m   BP/Weight 08/12/2016 07/31/2016 91/63/8466  Systolic BP 599 357 017  Diastolic BP 80 90 80  Wt. (Lbs) 282.6 285 280  BMI 41.73 42.09 41.35   Pulse Readings from Last 3 Encounters:  08/12/16 76  07/31/16 81  02/28/16 82    Physical Exam  Constitutional: He appears well-developed and well-nourished. No distress.  HENT:  Head: Normocephalic and atraumatic.  Neck: Normal range of motion. Neck supple.  Cardiovascular: Normal rate, regular rhythm, normal heart sounds and intact distal pulses.   Pulmonary/Chest: Effort normal and breath sounds normal.  Genitourinary: Testes normal.    Uncircumcised. Penile erythema and penile tenderness present. Discharge found.  Musculoskeletal: He exhibits no edema.  Neurological: He is alert.  Skin: Skin is warm and dry. No rash noted. No erythema.  Psychiatric: He has a normal mood and affect.   Lab Results  Component  Value Date   HGBA1C 5.60 05/11/2015   Depression screen [facility] 2/9 08/12/2016 07/31/2016 02/28/2016 09/20/2015 06/19/2015  Decreased Interest 1 1 1 3 1   Down, Depressed, Hopeless 1 1 0 2 1  PHQ - 2 Score 2 2 1 5 2   Altered sleeping 1 1 3 3 1   Tired, decreased energy 1 1 2 3 1   Change in appetite 1 1 1 2 1   Feeling bad or failure about yourself  1 1 1 2 1   Trouble concentrating 1 1 1 3 1   Moving slowly or fidgety/restless 0 0 1 1 1     Suicidal thoughts 0 0 0 0 2  PHQ-9 Score 7 7 10 19 10     GAD 7 : Generalized Anxiety Score 08/12/2016 07/31/2016 02/28/2016 09/20/2015  Nervous, Anxious, on Edge 1 3 1 3   Control/stop worrying 1 3 1 3   Worry too much - different things 1 3 1 3   Trouble relaxing 1 3 1 3   Restless 1 2 1 1   Easily annoyed or irritable 1 3 1 2   Afraid - awful might happen 0 0 1 1  Total GAD 7 Score 6 17 7 16     Lab Results  Component Value Date   HGBA1C 5.60 05/11/2015     Assessment & Plan:   Juan Kline was seen today for follow-up.  Diagnoses and all orders for this visit:  Anxiety state -     escitalopram (LEXAPRO) 10 MG tablet; Take 1 tablet (10 mg total) by mouth daily.  Balanitis -     Discontinue: hydrocortisone cream 1 %; Apply 1 application topically 2 (two) times daily. To head of penis for 7 days -     hydrocortisone cream 1 %; Apply 1 application topically 2 (two) times daily. To head of penis for 7 days   Follow-up: Return in about 6 weeks (around 09/23/2016) for anxiety.   Boykin Nearing MD

## 2016-08-12 NOTE — Assessment & Plan Note (Signed)
chronic Restart lexapro at 10 mg daily f/u in 6 weeks for re-evaluation

## 2016-08-12 NOTE — Patient Instructions (Addendum)
Miller was seen today for follow-up.  Diagnoses and all orders for this visit:  Anxiety state -     escitalopram (LEXAPRO) 10 MG tablet; Take 1 tablet (10 mg total) by mouth daily.  Balanitis -     Discontinue: hydrocortisone cream 1 %; Apply 1 application topically 2 (two) times daily. To head of penis for 7 days -     hydrocortisone cream 1 %; Apply 1 application topically 2 (two) times daily. To head of penis for 7 days   restart lexapro Continue hydrocortisone cream for one more week, then as needed,  your balanitis has improved  f/u in 6 weeks for anxiety  Dr. Adrian Blackwater

## 2016-08-13 MED FILL — LOSARTAN POTASSIUM 50 MG TA: 50 | 30 days supply | Qty: 30 | Fill #5

## 2016-08-27 ENCOUNTER — Encounter: Payer: Self-pay | Admitting: Family Medicine

## 2016-09-16 MED FILL — ?ESCITALOPRAM 10 MG TABLET: 10 | 30 days supply | Qty: 30 | Fill #1

## 2016-09-16 MED FILL — LOSARTAN POTASSIUM 50 MG TA: 50 | 30 days supply | Qty: 30 | Fill #6

## 2016-09-22 ENCOUNTER — Ambulatory Visit: Payer: Self-pay | Attending: Family Medicine

## 2016-09-26 ENCOUNTER — Ambulatory Visit: Payer: Self-pay | Attending: Family Medicine | Admitting: Family Medicine

## 2016-09-26 ENCOUNTER — Other Ambulatory Visit: Payer: Self-pay

## 2016-09-26 ENCOUNTER — Ambulatory Visit (HOSPITAL_COMMUNITY)
Admission: RE | Admit: 2016-09-26 | Discharge: 2016-09-26 | Disposition: A | Discharge status: Home / Self Care | Payer: Self-pay | Source: Ambulatory Visit | Attending: Family Medicine | Admitting: Family Medicine

## 2016-09-26 ENCOUNTER — Encounter: Payer: Self-pay | Admitting: Family Medicine

## 2016-09-26 VITALS — BP 160/100 | HR 92 | Temp 98.1°F | Resp 16 | Wt 283.6 lb

## 2016-09-26 DIAGNOSIS — R51 Headache: Secondary | ICD-10-CM | POA: Insufficient documentation

## 2016-09-26 DIAGNOSIS — R5383 Other fatigue: Secondary | ICD-10-CM | POA: Insufficient documentation

## 2016-09-26 DIAGNOSIS — Z79899 Other long term (current) drug therapy: Secondary | ICD-10-CM | POA: Insufficient documentation

## 2016-09-26 DIAGNOSIS — R9431 Abnormal electrocardiogram [ECG] [EKG]: Secondary | ICD-10-CM | POA: Insufficient documentation

## 2016-09-26 DIAGNOSIS — Z87891 Personal history of nicotine dependence: Secondary | ICD-10-CM | POA: Insufficient documentation

## 2016-09-26 DIAGNOSIS — R000 Tachycardia, unspecified: Secondary | ICD-10-CM | POA: Insufficient documentation

## 2016-09-26 DIAGNOSIS — I444 Left anterior fascicular block: Secondary | ICD-10-CM | POA: Insufficient documentation

## 2016-09-26 DIAGNOSIS — F419 Anxiety disorder, unspecified: Secondary | ICD-10-CM | POA: Insufficient documentation

## 2016-09-26 DIAGNOSIS — I10 Essential (primary) hypertension: Secondary | ICD-10-CM | POA: Insufficient documentation

## 2016-09-26 LAB — POCT GLYCOSYLATED HEMOGLOBIN (HGB A1C): HEMOGLOBIN A1C: 5.4

## 2016-09-26 LAB — GLUCOSE, POCT (MANUAL RESULT ENTRY): POC GLUCOSE: 138 mg/dL — AB (ref 70–99)

## 2016-09-26 MED ORDER — LOSARTAN POTASSIUM 100 MG PO TABS: 100.0000 mg | ORAL_TABLET | Freq: Every day | ORAL | 5 refills | Status: DC

## 2016-09-26 NOTE — Assessment & Plan Note (Signed)
A: elevated BP today with intermittent HA P: Increase losartan to 100 mg daily

## 2016-09-26 NOTE — Progress Notes (Signed)
Pt is in the office today for fatigue Pt states he has been feeling fatigue for a couple of weeks

## 2016-09-26 NOTE — Assessment & Plan Note (Signed)
A: fatigue with unrevealing exam. Normal CBG and A1c. There is subjective SOB that is mild with normal O2 sat. patinet has anxiety at baseline. No signs of acute infectious illness. P: CBC, TSH CMP CXR ordered as well

## 2016-09-26 NOTE — Progress Notes (Signed)
Subjective:  Patient ID: Juan Kline, male    DOB: 11/03/1970  Age: 46 y.o. MRN: [id number]  CC: Fatigue   HPI Miriam Kestler has HTN and anxiety presents for    1. Fatigue: x 2-3 weeks. Reports change in sleeping. No energy. Having chills even in a warm room. No fever.  No change in activity. No increase in dizziness or lightheadedness. He does reports increased headache over the past two weeks. No severe or persistent but this is an increase from his baseline. No bleeding. No abdominal pain, nausea, emesis. Some loose stool. No sick contacts.   Social History  Substance Use Topics  . Smoking status: Former Smoker    Packs/day: 0.10    Years: 26.00    Types: Cigarettes    Quit date: 02/16/2014  . Smokeless tobacco: Never Used  . Alcohol use Yes     Comment: 03/02/2014 "I rarely drink"    Outpatient Medications Prior to Visit  Medication Sig Dispense Refill  . escitalopram (LEXAPRO) 10 MG tablet Take 1 tablet (10 mg total) by mouth daily. 30 tablet 5  . hydrocortisone cream 1 % Apply 1 application topically 2 (two) times daily. To head of penis for 7 days 120 g 0  . losartan (COZAAR) 50 MG tablet Take 1 tablet (50 mg total) by mouth daily. 30 tablet 11   No facility-administered medications prior to visit.     ROS Review of Systems  Constitutional: Positive for activity change, appetite change, diaphoresis and fatigue. Negative for chills, fever and unexpected weight change.  HENT: Negative for sore throat.   Eyes: Negative for visual disturbance.  Respiratory: Positive for shortness of breath. Negative for cough.   Cardiovascular: Negative for chest pain, palpitations and leg swelling.  Gastrointestinal: Negative for abdominal pain, blood in stool, constipation, diarrhea, nausea and vomiting.  Endocrine: Negative for polydipsia, polyphagia and polyuria.  Musculoskeletal: Negative for arthralgias, back pain, gait problem, myalgias and neck pain.  Allergic/Immunologic: Negative  for immunocompromised state.  Neurological: Negative for dizziness and light-headedness.  Hematological: Negative for adenopathy. Does not bruise/bleed easily.  Psychiatric/Behavioral: Positive for sleep disturbance. Negative for dysphoric mood and suicidal ideas. The patient is nervous/anxious.     Objective:  BP (!) 160/100   Pulse 92   Temp 98.1 F (36.7 C) (Oral)   Resp 16   Wt 283 lb 9.6 oz (128.6 kg)   SpO2 95%   BMI 41.88 kg/m   BP/Weight 09/26/2016 08/12/2016 3/81/8299  Systolic BP 371 696 789  Diastolic BP 381 80 90  Wt. (Lbs) 283.6 282.6 285  BMI 41.88 41.73 42.09   Pulse Readings from Last 3 Encounters:  09/26/16 92  08/12/16 76  07/31/16 81    Physical Exam  Constitutional: He appears well-developed and well-nourished. No distress.  HENT:  Head: Normocephalic and atraumatic.  Neck: Normal range of motion. Neck supple.  Cardiovascular: Normal rate, regular rhythm, normal heart sounds and intact distal pulses.   Pulmonary/Chest: Effort normal and breath sounds normal.  Genitourinary: Testes normal.    Uncircumcised. Penile erythema present. Discharge found.  Musculoskeletal: He exhibits no edema.  Neurological: He is alert.  Skin: Skin is warm and dry. No rash noted. No erythema.  Psychiatric: He has a normal mood and affect.    Lab Results  Component Value Date   HGBA1C 5.4 09/26/2016   EKG: normal EKG, normal sinus rhythm. Left anterior fascicular block.   Depression screen [facility] 2/9 09/26/2016 08/12/2016 07/31/2016 02/28/2016 09/20/2015  Decreased  Interest 2 1 1 1 3   Down, Depressed, Hopeless 2 1 1  0 2  PHQ - 2 Score 4 2 2 1 5   Altered sleeping 2 1 1 3 3   Tired, decreased energy 3 1 1 2 3   Change in appetite 1 1 1 1 2   Feeling bad or failure about yourself  1 1 1 1 2   Trouble concentrating 1 1 1 1 3   Moving slowly or fidgety/restless 0 0 0 1 1  Suicidal thoughts 0 0 0 0 0  PHQ-9 Score 12 7 7 10 19     GAD 7 : Generalized Anxiety Score 09/26/2016  08/12/2016 07/31/2016 02/28/2016  Nervous, Anxious, on Edge 0 1 3 1   Control/stop worrying 1 1 3 1   Worry too much - different things 1 1 3 1   Trouble relaxing 1 1 3 1   Restless 0 1 2 1   Easily annoyed or irritable 0 1 3 1   Afraid - awful might happen 0 0 0 1  Total GAD 7 Score 3 6 17 7      Assessment & Plan:   Chun was seen today for fatigue.  Diagnoses and all orders for this visit:  Other fatigue -     HgB A1c -     Glucose (CBG) -     EKG 12-Lead -     CBC -     TSH -     CMP14+EGFR -     DG Chest 2 View; Future  Essential hypertension -     losartan (COZAAR) 100 MG tablet; Take 1 tablet (100 mg total) by mouth daily.   Follow-up: Return in about 3 weeks (around 10/17/2016) for fatigue.   Boykin Nearing MD

## 2016-09-26 NOTE — Patient Instructions (Addendum)
Juan Kline was seen today for fatigue.  Diagnoses and all orders for this visit:  Other fatigue -     HgB A1c -     Glucose (CBG) -     EKG 12-Lead -     CBC -     TSH -     CMP14+EGFR -     DG Chest 2 View; Future  Essential hypertension -     losartan (COZAAR) 100 MG tablet; Take 1 tablet (100 mg total) by mouth daily.   So far the work up is unrevealing  I do want reduction in BP, please increase losartan to 100 mg daily Please complete chest x-ray at [facility]  You will be called with results  F/u in 3 weeks for fatigue   Dr. Richardine Service

## 2016-09-27 LAB — CBC
Hematocrit: 44.7 % (ref 37.5–51.0)
Hemoglobin: 15.7 g/dL (ref 13.0–17.7)
MCH: 31.1 pg (ref 26.6–33.0)
MCHC: 35.1 g/dL (ref 31.5–35.7)
MCV: 89 fL (ref 79–97)
Platelets: 234 10*3/uL (ref 150–379)
RBC: 5.05 x10E6/uL (ref 4.14–5.80)
RDW: 15 % (ref 12.3–15.4)
WBC: 9.9 10*3/uL (ref 3.4–10.8)

## 2016-09-27 LAB — CMP14+EGFR
A/G RATIO: 1.6 (ref 1.2–2.2)
ALK PHOS: 41 IU/L (ref 39–117)
ALT: 21 IU/L (ref 0–44)
AST: 24 IU/L (ref 0–40)
Albumin: 4.1 g/dL (ref 3.5–5.5)
BUN/Creatinine Ratio: 28 — ABNORMAL HIGH (ref 9–20)
BUN: 24 mg/dL (ref 6–24)
Bilirubin Total: 0.4 mg/dL (ref 0.0–1.2)
CHLORIDE: 102 mmol/L (ref 96–106)
CO2: 22 mmol/L (ref 20–29)
CREATININE: 0.87 mg/dL (ref 0.76–1.27)
Calcium: 9.4 mg/dL (ref 8.7–10.2)
GFR calc Af Amer: 120 mL/min/{1.73_m2} (ref >59)
GFR calc non Af Amer: 104 mL/min/{1.73_m2} (ref >59)
GLOBULIN, TOTAL: 2.6 g/dL (ref 1.5–4.5)
Glucose: 98 mg/dL (ref 65–99)
POTASSIUM: 4.3 mmol/L (ref 3.5–5.2)
SODIUM: 141 mmol/L (ref 134–144)
Total Protein: 6.7 g/dL (ref 6.0–8.5)

## 2016-09-27 LAB — TSH: TSH: 2.31 u[IU]/mL (ref 0.450–4.500)

## 2016-09-28 ENCOUNTER — Emergency Department (HOSPITAL_COMMUNITY)
Admission: EM | Admit: 2016-09-28 | Discharge: 2016-09-28 | Disposition: A | Discharge status: Home / Self Care | Payer: Self-pay | Attending: Emergency Medicine | Admitting: Emergency Medicine

## 2016-09-28 ENCOUNTER — Encounter (HOSPITAL_COMMUNITY): Payer: Self-pay | Admitting: Emergency Medicine

## 2016-09-28 DIAGNOSIS — Z87891 Personal history of nicotine dependence: Secondary | ICD-10-CM | POA: Insufficient documentation

## 2016-09-28 DIAGNOSIS — Z79899 Other long term (current) drug therapy: Secondary | ICD-10-CM | POA: Insufficient documentation

## 2016-09-28 DIAGNOSIS — F606 Avoidant personality disorder: Secondary | ICD-10-CM | POA: Insufficient documentation

## 2016-09-28 DIAGNOSIS — I10 Essential (primary) hypertension: Secondary | ICD-10-CM

## 2016-09-28 LAB — BASIC METABOLIC PANEL
Anion gap: 7 (ref 5–15)
BUN: 15 mg/dL (ref 6–20)
CO2: 24 mmol/L (ref 22–32)
CREATININE: 1 mg/dL (ref 0.61–1.24)
Calcium: 8.8 mg/dL — ABNORMAL LOW (ref 8.9–10.3)
Chloride: 105 mmol/L (ref 101–111)
GFR calc Af Amer: >60 mL/min (ref >60)
Glucose, Bld: 116 mg/dL — ABNORMAL HIGH (ref 65–99)
Potassium: 3.7 mmol/L (ref 3.5–5.1)
SODIUM: 136 mmol/L (ref 135–145)

## 2016-09-28 LAB — CBC WITH DIFFERENTIAL/PLATELET
BASOS ABS: 0 10*3/uL (ref 0.0–0.1)
Basophils Relative: 0 %
Eosinophils Absolute: 0.1 10*3/uL (ref 0.0–0.7)
Eosinophils Relative: 1 %
HCT: 43.6 % (ref 39.0–52.0)
Hemoglobin: 14.6 g/dL (ref 13.0–17.0)
LYMPHS ABS: 1.4 10*3/uL (ref 0.7–4.0)
LYMPHS PCT: 17 %
MCH: 29.9 pg (ref 26.0–34.0)
MCHC: 33.5 g/dL (ref 30.0–36.0)
MCV: 89.3 fL (ref 78.0–100.0)
MONO ABS: 0.8 10*3/uL (ref 0.1–1.0)
Monocytes Relative: 9 %
NEUTROS ABS: 6.2 10*3/uL (ref 1.7–7.7)
Neutrophils Relative %: 73 %
Platelets: 201 10*3/uL (ref 150–400)
RBC: 4.88 MIL/uL (ref 4.22–5.81)
RDW: 13.7 % (ref 11.5–15.5)
WBC: 8.5 10*3/uL (ref 4.0–10.5)

## 2016-09-28 MED ORDER — LORAZEPAM 1 MG PO TABS
1.0000 mg | ORAL_TABLET | Freq: Once | ORAL | Status: AC
  Administered 2016-09-28: 1 mg via ORAL
  Filled 2016-09-28: qty 1

## 2016-09-28 NOTE — ED Provider Notes (Signed)
[facility] DEPT Provider Note   CSN: [id number] Arrival date & time: 09/28/16  1922     History   Chief Complaint Chief Complaint  Patient presents with  . Hypertension    HPI Juan Kline is a 46 y.o. male.  HPI  Patient is a 46 year old male with past medical history significant for anxiety and hypertension, presents to the emergency department when he started to "feel off" about one hour prior to arrival. States that he started to feel anxious, flushed, had a burning sensation down his arms and legs. States that he checked his blood pressure at home and it was elevated and he was concerned that "something was wrong". Denies headache, change in vision, chest pain, shortness of breath, nausea, vomiting, diaphoresis. Patient states that he saw his primary care physician 2 days ago and they doubled the dose of his Cozaar to 100 mg daily, he has not been taking the medication with increased dose.  Past Medical History:  Diagnosis Date  . Chronic foot ulcer ([place]) admitted 03/02/2014   medial border first metatarsal left foot  . Hypertension Dx 2009  . Osteomyelitis left first metatarsal 03/02/2014  . Venous stasis ulcer of left lower extremity ([place])    [name] 03/02/2014    Patient Active Problem List   Diagnosis Date Noted  . Other fatigue 09/26/2016  . Dizzy 05/11/2015  . Callus of foot 05/11/2015  . Anxiety state 05/11/2015  . Former smoker 05/11/2015  . Prediabetes 11/27/2014  . Muscle spasm 08/21/2014  . Sneezing 06/28/2014  . HLD (hyperlipidemia) 05/22/2014  . Vitamin D insufficiency 05/22/2014  . Temporal pain 05/19/2014  . Numbness and tingling of foot 05/19/2014  . Venous stasis dermatitis of both lower extremities 05/19/2014  . HTN (hypertension) 03/08/2014  . Obesity 03/02/2014    Past Surgical History:  Procedure Laterality Date  . NO PAST SURGERIES         Home Medications    Prior to Admission medications   Medication Sig Start Date End Date  Taking? Authorizing Provider  escitalopram (LEXAPRO) 10 MG tablet Take 1 tablet (10 mg total) by mouth daily. 08/12/16  Yes Funches, Josalyn, MD  losartan (COZAAR) 100 MG tablet Take 1 tablet (100 mg total) by mouth daily. 09/26/16  Yes Boykin Nearing, MD    Family History Family History  Problem Relation Age of Onset  . Hypertension Mother   . Hyperlipidemia Mother   . Diabetes Mother   . Hypertension Father   . Hyperlipidemia Father   . Diabetes Brother     Social History Social History  Substance Use Topics  . Smoking status: Former Smoker    Packs/day: 0.10    Years: 26.00    Types: Cigarettes    Quit date: 02/16/2014  . Smokeless tobacco: Never Used  . Alcohol use Yes     Comment: 03/02/2014 "I rarely drink"     Allergies   Lyrica [pregabalin]   Review of Systems Review of Systems  Constitutional: Negative for appetite change and fever.  HENT: Negative for congestion.   Eyes: Negative for visual disturbance.  Respiratory: Negative for chest tightness, shortness of breath and wheezing.   Cardiovascular: Negative for chest pain and palpitations.  Gastrointestinal: Negative for abdominal pain, blood in stool, nausea and vomiting.  Genitourinary: Negative for decreased urine volume.  Musculoskeletal: Negative for back pain.  Skin: Negative for rash.  Neurological: Negative for dizziness, seizures, weakness and headaches.  Psychiatric/Behavioral: Negative for behavioral problems.     Physical  Exam Updated Vital Signs BP (!) 147/112   Pulse 87   Temp 98.1 F (36.7 C) (Oral)   Resp 20   Ht 5\' 9"  (1.753 m)   Wt 127 kg (280 lb)   SpO2 97%   BMI 41.35 kg/m   Physical Exam  Constitutional: He is oriented to person, place, and time. He appears well-developed and well-nourished.  Appears anxious  HENT:  Head: Atraumatic.  Mouth/Throat: Oropharynx is clear and moist.  Eyes: Conjunctivae and EOM are normal.  Neck: Normal range of motion. No JVD present.    Cardiovascular: Regular rhythm, normal heart sounds and intact distal pulses.   Pulmonary/Chest: Effort normal and breath sounds normal. No respiratory distress.  Abdominal: Soft. He exhibits no distension. There is no tenderness.  Musculoskeletal: Normal range of motion. He exhibits no edema.  No unilateral leg swelling  Neurological: He is alert and oriented to person, place, and time.  Skin: Skin is warm.  Psychiatric:  Anxious     ED Treatments / Results  Labs (all labs ordered are listed, but only abnormal results are displayed) Labs Reviewed  BASIC METABOLIC PANEL - Abnormal; Notable for the following:       Result Value   Glucose, Bld 116 (*)    Calcium 8.8 (*)    All other components within normal limits  CBC WITH DIFFERENTIAL/PLATELET    EKG  EKG Interpretation None       Radiology No results found.  Procedures Procedures (including critical care time)  Medications Ordered in ED Medications  LORazepam (ATIVAN) tablet 1 mg (1 mg Oral Given 09/28/16 1959)     Initial Impression / Assessment and Plan / ED Course  I have reviewed the triage vital signs and the nursing notes.  Pertinent labs & imaging results that were available during my care of the patient were reviewed by me and considered in my medical decision making (see chart for details).     Patient is a 46 year old with past medical history significant for hypertension and anxiety, who presents to the emergency department with hypertension. When EMS arrived BP 200/100. Patient has not been taking his home antihypertensive medications as prescribed. No chest pain or dyspnea.  EKG showed normal sinus rhythm, incomplete RBBB, normal intervals, no chamber enlargement, no signs of acute ischemia. Lab work remarkable for creatinine at baseline. No significant electrolyte abnormalities. Patient given Ativan for anxiety. On reevaluation patient had improvement of his blood pressure and anxiety. Patient most  likely was hypertensive secondary to medication noncompliance.  Patient stable for discharge home. Discussed follow-up with his primary care physician. Given strict return precautions to the emergency department. Patient expressed understanding, no questions or concerns at time of discharge.  Final Clinical Impressions(s) / ED Diagnoses   Final diagnoses:  Hypertension, unspecified type    New Prescriptions Discharge Medication List as of 09/28/2016  9:46 PM       Nathaniel Man, MD 09/28/16 [id number]    Lajean Saver, MD 09/29/16 [phone]

## 2016-09-28 NOTE — ED Triage Notes (Signed)
Pt BIB EMS from home. Pt felt "off" about an hour ago. Felt flushed, burning sensation down both legs, mildly lightheaded. No H/A, CP, SOB, N/V. Went to PCP 2 days ago and was found to be hypertensive. PCP incr'd HTN meds but pt has only been taking previous dose. Initial BP for EMS 210/120. Took 50mg  cozar earlier in day, and another 50mg  in presence of EMS.

## 2016-09-28 NOTE — ED Notes (Signed)
E-signature not available, pt verbalized understanding of DC instructions  

## 2016-09-29 ENCOUNTER — Emergency Department (HOSPITAL_COMMUNITY)
Admission: EM | Admit: 2016-09-29 | Discharge: 2016-09-29 | Disposition: A | Discharge status: Home / Self Care | Payer: Self-pay | Attending: Emergency Medicine | Admitting: Emergency Medicine

## 2016-09-29 ENCOUNTER — Encounter (HOSPITAL_COMMUNITY): Payer: Self-pay | Admitting: Emergency Medicine

## 2016-09-29 DIAGNOSIS — I10 Essential (primary) hypertension: Secondary | ICD-10-CM | POA: Insufficient documentation

## 2016-09-29 DIAGNOSIS — Z87891 Personal history of nicotine dependence: Secondary | ICD-10-CM | POA: Insufficient documentation

## 2016-09-29 DIAGNOSIS — Z79899 Other long term (current) drug therapy: Secondary | ICD-10-CM | POA: Insufficient documentation

## 2016-09-29 NOTE — ED Triage Notes (Signed)
Pt arrives from home via [facility] to triage reporting continued HTN. Pt reports experiencing acute dizziness with bilat hand coldness after taking increased dosage of losartan.  Pt denies dizziness at this time, denies any CP, SOB, focal deficits.  NAD noted, resp e/u.

## 2016-09-29 NOTE — ED Provider Notes (Signed)
[facility] DEPT Provider Note   CSN: [id number] Arrival date & time: 09/29/16  1309  By signing my name below, I, Jeanell Sparrow, attest that this documentation has been prepared under the direction and in the presence of Malvin Johns, MD. Electronically Signed: Jeanell Sparrow, Scribe. 09/29/2016. 3:58 PM.  History   Chief Complaint Chief Complaint  Patient presents with  . Hypertension   The history is provided by the patient. No language interpreter was used.   HPI Comments: Juan Kline is a 46 y.o. male with a PMHx of HTN who presents to the Emergency Department complaining of HTN.His BP in the ED today was 166/118. He reports associated anxiety. His BP at home was ~174 systolic at home. He feels like a cold feeling comes over him when his BP is elevated and he gets anxious.  No CP or SOB.  No headaches.  He was seen by his PCP 3 days ago and told to double his Losartan.  He did not do that until today.  He was also seen in the ED yesterday for the same symptoms.   Denies any headache, chest pain, SOB, vomiting, diarrhea, gait problem, or other complaints at this time.  Past Medical History:  Diagnosis Date  . Chronic foot ulcer ([place]) admitted 03/02/2014   medial border first metatarsal left foot  . Hypertension Dx 2009  . Osteomyelitis left first metatarsal 03/02/2014  . Venous stasis ulcer of left lower extremity ([place])    [name] 03/02/2014    Patient Active Problem List   Diagnosis Date Noted  . Other fatigue 09/26/2016  . Dizzy 05/11/2015  . Callus of foot 05/11/2015  . Anxiety state 05/11/2015  . Former smoker 05/11/2015  . Prediabetes 11/27/2014  . Muscle spasm 08/21/2014  . Sneezing 06/28/2014  . HLD (hyperlipidemia) 05/22/2014  . Vitamin D insufficiency 05/22/2014  . Temporal pain 05/19/2014  . Numbness and tingling of foot 05/19/2014  . Venous stasis dermatitis of both lower extremities 05/19/2014  . HTN (hypertension) 03/08/2014  . Obesity 03/02/2014     Past Surgical History:  Procedure Laterality Date  . NO PAST SURGERIES         Home Medications    Prior to Admission medications   Medication Sig Start Date End Date Taking? Authorizing Provider  escitalopram (LEXAPRO) 10 MG tablet Take 1 tablet (10 mg total) by mouth daily. 08/12/16   Funches, Adriana Mccallum, MD  losartan (COZAAR) 100 MG tablet Take 1 tablet (100 mg total) by mouth daily. 09/26/16   Boykin Nearing, MD    Family History Family History  Problem Relation Age of Onset  . Hypertension Mother   . Hyperlipidemia Mother   . Diabetes Mother   . Hypertension Father   . Hyperlipidemia Father   . Diabetes Brother     Social History Social History  Substance Use Topics  . Smoking status: Former Smoker    Packs/day: 0.10    Years: 26.00    Types: Cigarettes    Quit date: 02/16/2014  . Smokeless tobacco: Never Used  . Alcohol use Yes     Comment: 03/02/2014 "I rarely drink"     Allergies   Lyrica [pregabalin]   Review of Systems Review of Systems  Constitutional: Negative for chills, diaphoresis, fatigue and fever.  HENT: Negative for congestion, rhinorrhea and sneezing.   Eyes: Negative.   Respiratory: Negative for cough, chest tightness and shortness of breath.   Cardiovascular: Negative for chest pain and leg swelling.  Gastrointestinal: Negative for abdominal  pain, blood in stool, diarrhea, nausea and vomiting.  Genitourinary: Negative for difficulty urinating, flank pain, frequency and hematuria.  Musculoskeletal: Negative for arthralgias, back pain and gait problem.  Skin: Negative for rash.  Neurological: Negative for dizziness, speech difficulty, weakness, numbness and headaches.     Physical Exam Updated Vital Signs BP (!) 166/118 (BP Location: Right Arm)   Pulse 73   Temp 98.4 F (36.9 C) (Oral)   Resp 16   Ht 5\' 9"  (1.753 m)   Wt 280 lb (127 kg)   SpO2 97%   BMI 41.35 kg/m   Physical Exam  Constitutional: He is oriented to person,  place, and time. He appears well-developed and well-nourished.  HENT:  Head: Normocephalic and atraumatic.  Eyes: Pupils are equal, round, and reactive to light.  Neck: Normal range of motion. Neck supple.  Cardiovascular: Normal rate, regular rhythm and normal heart sounds.   Pulmonary/Chest: Effort normal and breath sounds normal. No respiratory distress. He has no wheezes. He has no rales. He exhibits no tenderness.  Abdominal: Soft. Bowel sounds are normal. There is no tenderness. There is no rebound and no guarding.  Musculoskeletal: Normal range of motion. He exhibits no edema.  Lymphadenopathy:    He has no cervical adenopathy.  Neurological: He is alert and oriented to person, place, and time.  Motor 5/5 all extremities Sensation grossly intact to LT all extremities Finger to Nose intact, no pronator drift CN II-XII grossly intact Gait normal   Skin: Skin is warm and dry. No rash noted.  Psychiatric: He has a normal mood and affect.     ED Treatments / Results  DIAGNOSTIC STUDIES: Oxygen Saturation is 97% on RA, normal by my interpretation.    COORDINATION OF CARE: 4:03 PM- Pt advised of plan for treatment and pt agrees.  Labs (all labs ordered are listed, but only abnormal results are displayed) Labs Reviewed - No data to display  EKG  EKG Interpretation  Date/Time:  Monday September 29 2016 16:47:09 EDT Ventricular Rate:  70 PR Interval:  202 QRS Duration: 104 QT Interval:  416 QTC Calculation: 449 R Axis:   -48 Text Interpretation:  Normal sinus rhythm Left anterior fascicular block Abnormal ECG since last tracing no significant change Confirmed by Malvin Johns [phone]) on 09/29/2016 5:00:21 PM       Radiology No results found.  Procedures Procedures (including critical care time)  Medications Ordered in ED Medications - No data to display   Initial Impression / Assessment and Plan / ED Course  I have reviewed the triage vital signs and the nursing  notes.  Pertinent labs & imaging results that were available during my care of the patient were reviewed by me and considered in my medical decision making (see chart for details).     Patient presents with elevated blood pressure. He is asymptomatic currently. He does tend to get very anxious when his blood pressure goes up. He just started his new dose of losartan today. He does not have any symptoms of end organ damage. He was encouraged to give it a little bit more time having the increased dose of his losartan and have close follow-up with his PCP.Return precautions were given.  Final Clinical Impressions(s) / ED Diagnoses   Final diagnoses:  Essential hypertension    New Prescriptions New Prescriptions   No medications on file   I personally performed the services described in this documentation, which was scribed in my presence.  The recorded information  has been reviewed and considered.     Malvin Johns, MD 09/29/16 1705

## 2016-10-02 ENCOUNTER — Telehealth: Payer: Self-pay

## 2016-10-02 NOTE — Telephone Encounter (Signed)
Pt was called and informed of lab results. 

## 2016-10-06 ENCOUNTER — Ambulatory Visit: Payer: Self-pay | Attending: Family Medicine | Admitting: Physician Assistant

## 2016-10-06 VITALS — BP 149/88 | HR 92 | Temp 98.0°F | Resp 18 | Ht 69.0 in | Wt 284.0 lb

## 2016-10-06 DIAGNOSIS — I10 Essential (primary) hypertension: Secondary | ICD-10-CM | POA: Insufficient documentation

## 2016-10-06 DIAGNOSIS — E669 Obesity, unspecified: Secondary | ICD-10-CM | POA: Insufficient documentation

## 2016-10-06 DIAGNOSIS — Z79899 Other long term (current) drug therapy: Secondary | ICD-10-CM | POA: Insufficient documentation

## 2016-10-06 MED ORDER — LOSARTAN POTASSIUM-HCTZ 100-25 MG PO TABS: 1.0000 | ORAL_TABLET | Freq: Every day | ORAL | 3 refills | Status: DC

## 2016-10-06 MED FILL — LOSARTAN-HCTZ 100-25 MG TAB: 100-25 | 30 days supply | Qty: 30 | Fill #0

## 2016-10-06 NOTE — Progress Notes (Signed)
Patient ID: Juan Kline, male   DOB: Apr 01, 1971, 46 y.o.   MRN: [id number]     Juan Kline, is a 46 y.o. male  [id number]:[id number]  [id number]  DOB - Nov 29, 1970  Subjective:  Chief Complaint and HPI: Juan Kline is a 46 y.o. male here today to establish care and for a follow up visit After being seen in the ED for htn 09/28/2016 and 09/29/2016 for uncontrolled htn.  No significant changes on EKG since last EKG and no acute ischemia.  He was last increased from 50 to 100mg  of Losartan a little over 2 weeks ago.  BP at home ~160/100.  He denies CP/HA/vision changes/SOB. K+ and BUN WNL 09/28/2016. He denies CP/HADizziness.  He has been compliant with his meds.  ED/Hospital notes reviewed.     ROS:   Constitutional:  No f/c, No night sweats, No unexplained weight loss. EENT:  No vision changes, No blurry vision, No hearing changes. No mouth, throat, or ear problems.  Respiratory: No cough, No SOB Cardiac: No CP, no palpitations GI:  No abd pain, No N/V/D. GU: No Urinary s/sx Musculoskeletal: No joint pain Neuro: No headache, no dizziness, no motor weakness.  Skin: No rash Endocrine:  No polydipsia. No polyuria.  Psych: Denies SI/HI  No problems updated.  ALLERGIES: Allergies  Allergen Reactions  . Lyrica [Pregabalin] Anxiety    PAST MEDICAL HISTORY: Past Medical History:  Diagnosis Date  . Chronic foot ulcer ([place]) admitted 03/02/2014   medial border first metatarsal left foot  . Hypertension Dx 2009  . Osteomyelitis left first metatarsal 03/02/2014  . Venous stasis ulcer of left lower extremity ([place])    [name] 03/02/2014    MEDICATIONS AT HOME: Prior to Admission medications   Medication Sig Start Date End Date Taking? Authorizing Provider  escitalopram (LEXAPRO) 10 MG tablet Take 1 tablet (10 mg total) by mouth daily. 08/12/16  Yes Funches, Josalyn, MD  losartan-hydrochlorothiazide (HYZAAR) 100-25 MG tablet Take 1 tablet by mouth daily. 10/06/16   Argentina Donovan, PA-C      Objective:  EXAM:   Vitals:   10/06/16 1432  BP: (!) 149/88  Pulse: 92  Resp: 18  Temp: 98 F (36.7 C)  TempSrc: Oral  SpO2: 97%  Weight: 284 lb (128.8 kg)  Height: 5\' 9"  (1.753 m)    General appearance : A&OX3. NAD. Non-toxic-appearing HEENT: Atraumatic and Normocephalic.  PERRLA. EOM intact.   Neck: supple, no JVD. No cervical lymphadenopathy. No thyromegaly Chest/Lungs:  Breathing-non-labored, Good air entry bilaterally, breath sounds normal without rales, rhonchi, or wheezing  CVS: S1 S2 regular, no murmurs, gallops, rubs  Extremities: Bilateral Lower Ext shows no edema, both legs are warm to touch with = pulse throughout Neurology:  CN II-XII grossly intact, Non focal.   Psych:  TP linear. J/I WNL. Normal speech. Appropriate eye contact and affect.  Skin:  No Rash  Data Review Lab Results  Component Value Date   HGBA1C 5.4 09/26/2016   HGBA1C 5.60 05/11/2015   HGBA1C 5.50 11/27/2014     Assessment & Plan   1. Essential hypertension Stop losartan 100mg  and start: - losartan-hydrochlorothiazide (HYZAAR) 100-25 MG tablet; Take 1 tablet by mouth daily.  Dispense: 90 tablet; Refill: 3 - Basic metabolic panel; Future Check BP daily and record and bring to next visit.  2. Obesity, unspecified classification, unspecified obesity type, unspecified whether serious comorbidity present Weight loss recommended.  I have had a lengthy discussion and provided education about insulin resistance and the intake  of too much sugar/refined carbohydrates.  I have advised the patient to work at a goal of eliminating sugary drinks, candy, desserts, sweets, refined sugars, processed foods, and white carbohydrates.  The patient expresses understanding.     Patient have been counseled extensively about nutrition and exercise  Return in about 3 weeks (around 10/27/2016) for appt with Theda Sers for htn check/meds.  The patient was given clear instructions to go to ER or return  to medical center if symptoms don't improve, worsen or new problems develop. The patient verbalized understanding. The patient was told to call to get lab results if they haven't heard anything in the next week.     Freeman Caldron, PA-C [facility] and [facility], [place]   10/06/2016, 2:53 PM

## 2016-10-28 ENCOUNTER — Ambulatory Visit: Payer: Self-pay

## 2016-10-28 ENCOUNTER — Ambulatory Visit: Payer: Self-pay | Attending: Family Medicine | Admitting: Pharmacist

## 2016-10-28 VITALS — BP 139/101 | HR 97

## 2016-10-28 DIAGNOSIS — Z79899 Other long term (current) drug therapy: Secondary | ICD-10-CM | POA: Insufficient documentation

## 2016-10-28 DIAGNOSIS — I10 Essential (primary) hypertension: Secondary | ICD-10-CM | POA: Insufficient documentation

## 2016-10-28 NOTE — Patient Instructions (Signed)
Thanks for coming to see me  Schedule appt with Dr. Adrian Blackwater in 2 weeks

## 2016-10-28 NOTE — Progress Notes (Signed)
   S:    Patient arrives in good spirits.    Presents to the clinic for hypertension evaluation. Patient was referred on 10/06/16.  Patient was last seen by Primary Care Provider on 10/06/16.   Patient reports adherence with medications.  Current BP Medications include:  Losartan-HCTZ 100-25 mg daily   Dietary habits include: cut out salt and caffeine.  Patient was recently discharged from hospital and all medications have been reviewed.  Home BP readings: 77-154/51-105 (most 110s-130s/80s). He does not know why he has such variations in his blood pressure. He gets them all in the left arm. They are all in the afternoon. He denies caffeine or salt intake or stress or naps during the day.    O:   Last 3 Office BP readings: BP Readings from Last 3 Encounters:  10/28/16 (!) 139/101  10/06/16 (!) 149/88  09/29/16 (!) 147/114    BMET    Component Value Date/Time   NA 136 09/28/2016 1953   NA 141 09/26/2016 1701   K 3.7 09/28/2016 1953   CL 105 09/28/2016 1953   CO2 24 09/28/2016 1953   GLUCOSE 116 (H) 09/28/2016 1953   BUN 15 09/28/2016 1953   BUN 24 09/26/2016 1701   CREATININE 1.00 09/28/2016 1953   CREATININE 0.70 02/28/2016 1244   CALCIUM 8.8 (L) 09/28/2016 1953   GFRNONAA >60 09/28/2016 1953   GFRNONAA >89 02/28/2016 1244   GFRAA >60 09/28/2016 1953   GFRAA >89 02/28/2016 1244    A/P: Hypertension longstanding currently uncontrolled on current medications. However, he has blood pressure readings at home in the last week that are almost too low. Most blood pressure readings at home are normal so will continue current medications. Patient to document what he was doing the hour prior to taking his blood pressure to see if there is anything that is causing all of the variation. Patient to follow up with Dr. Adrian Blackwater in 2 weeks. If he has continued elevated blood pressure at that time, would consider additional medication.   Results reviewed and written information provided.    Total time in face-to-face counseling 15 minutes.   F/U Clinic Visit with Dr. Adrian Blackwater.

## 2016-10-29 LAB — BASIC METABOLIC PANEL
BUN/Creatinine Ratio: 23 — ABNORMAL HIGH (ref 9–20)
BUN: 25 mg/dL — AB (ref 6–24)
CO2: 26 mmol/L (ref 20–29)
CREATININE: 1.11 mg/dL (ref 0.76–1.27)
Calcium: 9.7 mg/dL (ref 8.7–10.2)
Chloride: 95 mmol/L — ABNORMAL LOW (ref 96–106)
GFR calc Af Amer: 92 mL/min/{1.73_m2} (ref >59)
GFR, EST NON AFRICAN AMERICAN: 80 mL/min/{1.73_m2} (ref >59)
GLUCOSE: 135 mg/dL — AB (ref 65–99)
Potassium: 4.7 mmol/L (ref 3.5–5.2)
Sodium: 140 mmol/L (ref 134–144)

## 2016-11-04 MED FILL — LOSARTAN-HCTZ 100-25 MG TAB: 100-25 | 30 days supply | Qty: 30 | Fill #1

## 2016-11-05 ENCOUNTER — Telehealth: Payer: Self-pay

## 2016-11-05 NOTE — Telephone Encounter (Signed)
Pt was called and informed of lab results. 

## 2016-11-11 ENCOUNTER — Encounter: Payer: Self-pay | Admitting: Family Medicine

## 2016-11-11 ENCOUNTER — Ambulatory Visit: Payer: Self-pay | Attending: Family Medicine | Admitting: Family Medicine

## 2016-11-11 VITALS — BP 134/89 | HR 79 | Temp 97.9°F | Ht 69.0 in | Wt 283.4 lb

## 2016-11-11 DIAGNOSIS — I10 Essential (primary) hypertension: Secondary | ICD-10-CM

## 2016-11-11 DIAGNOSIS — F419 Anxiety disorder, unspecified: Secondary | ICD-10-CM | POA: Insufficient documentation

## 2016-11-11 DIAGNOSIS — Z87891 Personal history of nicotine dependence: Secondary | ICD-10-CM | POA: Insufficient documentation

## 2016-11-11 DIAGNOSIS — R42 Dizziness and giddiness: Secondary | ICD-10-CM | POA: Insufficient documentation

## 2016-11-11 DIAGNOSIS — R5383 Other fatigue: Secondary | ICD-10-CM | POA: Insufficient documentation

## 2016-11-11 MED ORDER — AMLODIPINE BESYLATE 5 MG PO TABS: 5.0000 mg | ORAL_TABLET | Freq: Every day | ORAL | 2 refills | Status: DC

## 2016-11-11 MED ORDER — LOSARTAN POTASSIUM 100 MG PO TABS: 100.0000 mg | ORAL_TABLET | Freq: Every day | ORAL | 3 refills | Status: DC

## 2016-11-11 MED FILL — AMLODIPINE BESYLATE 5 MG TA: 5 | 30 days supply | Qty: 30 | Fill #0

## 2016-11-11 NOTE — Patient Instructions (Addendum)
Juan Kline was seen today for hypertension.  Diagnoses and all orders for this visit:  Essential hypertension -     losartan (COZAAR) 100 MG tablet; Take 1 tablet (100 mg total) by mouth daily. -     amLODipine (NORVASC) 5 MG tablet; Take 1 tablet (5 mg total) by mouth daily.   Stop hyzaar Stopping HCTZ  Continue losartan 100 mg daily Add norvasc first with 1/2 tab 2.5 mg daily for first two weeks, then 5 mg daily   F/u in 4 weeks for HTN and dizziness   Dr. Adrian Blackwater

## 2016-11-11 NOTE — Progress Notes (Signed)
Subjective:  Patient ID: Juan Kline, male    DOB: Jul 04, 1970  Age: 46 y.o. MRN: [id number]  CC: Hypertension   HPI Arland Usery has HTN and anxiety presents for    1. HTN: he has labile BPs at home, 68/50, 85/61, 151/111. Still has intermittent fatigue and dizziness. Compliant with Hyzaar 100-25 mg daily.    Social History  Substance Use Topics  . Smoking status: Former Smoker    Packs/day: 0.10    Years: 26.00    Types: Cigarettes    Quit date: 02/16/2014  . Smokeless tobacco: Never Used  . Alcohol use Yes     Comment: 03/02/2014 "I rarely drink"    Outpatient Medications Prior to Visit  Medication Sig Dispense Refill  . losartan-hydrochlorothiazide (HYZAAR) 100-25 MG tablet Take 1 tablet by mouth daily. 90 tablet 3  . escitalopram (LEXAPRO) 10 MG tablet Take 1 tablet (10 mg total) by mouth daily. (Patient not taking: Reported on 11/11/2016) 30 tablet 5   No facility-administered medications prior to visit.     ROS Review of Systems  Constitutional: Positive for activity change, appetite change, diaphoresis and fatigue. Negative for chills, fever and unexpected weight change.  HENT: Negative for sore throat.   Eyes: Negative for visual disturbance.  Respiratory: Positive for shortness of breath. Negative for cough.   Cardiovascular: Negative for chest pain, palpitations and leg swelling.  Gastrointestinal: Negative for abdominal pain, blood in stool, constipation, diarrhea, nausea and vomiting.  Endocrine: Negative for polydipsia, polyphagia and polyuria.  Musculoskeletal: Negative for arthralgias, back pain, gait problem, myalgias and neck pain.  Allergic/Immunologic: Negative for immunocompromised state.  Neurological: Negative for dizziness and light-headedness.  Hematological: Negative for adenopathy. Does not bruise/bleed easily.  Psychiatric/Behavioral: Positive for sleep disturbance. Negative for dysphoric mood and suicidal ideas. The patient is nervous/anxious.      Objective:  BP 134/89   Pulse 79   Temp 97.9 F (36.6 C) (Oral)   Ht 5\' 9"  (1.753 m)   Wt 283 lb 6.4 oz (128.5 kg)   SpO2 98%   BMI 41.85 kg/m   BP/Weight 11/11/2016 10/28/2016 4/49/6759  Systolic BP 163 846 659  Diastolic BP 89 935 88  Wt. (Lbs) 283.4 - 284  BMI 41.85 - 41.94   Pulse Readings from Last 3 Encounters:  11/11/16 79  10/28/16 97  10/06/16 92    Physical Exam  Constitutional: He appears well-developed and well-nourished. No distress.  HENT:  Head: Normocephalic and atraumatic.  Neck: Normal range of motion. Neck supple.  Cardiovascular: Normal rate, regular rhythm, normal heart sounds and intact distal pulses.   Pulmonary/Chest: Effort normal and breath sounds normal.  Genitourinary: Testes normal. Uncircumcised.  Musculoskeletal: He exhibits no edema.  Neurological: He is alert.  Skin: Skin is warm and dry. No rash noted. No erythema.  Psychiatric: He has a normal mood and affect.    Lab Results  Component Value Date   HGBA1C 5.4 09/26/2016   Lab Results  Component Value Date   TSH 2.310 09/26/2016   CBC    Component Value Date/Time   WBC 8.5 09/28/2016 1953   RBC 4.88 09/28/2016 1953   HGB 14.6 09/28/2016 1953   HGB 15.7 09/26/2016 1701   HCT 43.6 09/28/2016 1953   HCT 44.7 09/26/2016 1701   PLT 201 09/28/2016 1953   PLT 234 09/26/2016 1701   MCV 89.3 09/28/2016 1953   MCV 89 09/26/2016 1701   MCH 29.9 09/28/2016 1953   MCHC 33.5 09/28/2016 1953  RDW 13.7 09/28/2016 1953   RDW 15.0 09/26/2016 1701   LYMPHSABS 1.4 09/28/2016 1953   MONOABS 0.8 09/28/2016 1953   EOSABS 0.1 09/28/2016 1953   BASOSABS 0.0 09/28/2016 1953     Depression screen PHQ 2/9 11/11/2016 10/06/2016 09/26/2016 08/12/2016 07/31/2016  Decreased Interest 1 1 2 1 1   Down, Depressed, Hopeless 1 1 2 1 1   PHQ - 2 Score 2 2 4 2 2   Altered sleeping 1 1 2 1 1   Tired, decreased energy 1 1 3 1 1   Change in appetite 1 1 1 1 1   Feeling bad or failure about yourself  1 1 1  1 1   Trouble concentrating 1 1 1 1 1   Moving slowly or fidgety/restless 1 0 0 0 0  Suicidal thoughts 1 0 0 0 0  PHQ-9 Score 9 7 12 7 7   Some recent data might be hidden    GAD 7 : Generalized Anxiety Score 11/11/2016 10/06/2016 09/26/2016 08/12/2016  Nervous, Anxious, on Edge 1 1 0 1  Control/stop worrying 1 1 1 1   Worry too much - different things 1 1 1 1   Trouble relaxing 1 1 1 1   Restless 1 1 0 1  Easily annoyed or irritable 1 1 0 1  Afraid - awful might happen 1 1 0 0  Total GAD 7 Score 7 7 3 6      Assessment & Plan:   Damiano was seen today for hypertension.  Diagnoses and all orders for this visit:  Essential hypertension -     losartan (COZAAR) 100 MG tablet; Take 1 tablet (100 mg total) by mouth daily. -     amLODipine (NORVASC) 5 MG tablet; Take 1 tablet (5 mg total) by mouth daily.   Follow-up: Return in about 4 weeks (around 12/09/2016) for HTN and dizziness .   Boykin Nearing MD

## 2016-11-13 NOTE — Assessment & Plan Note (Addendum)
A: HTN with dizziness and lightheadedness P: Stop hyzaar Continue losartan 100 mg daily Add norvasc 2.5 mg for one week, then 5 mg daily

## 2016-11-14 MED FILL — LOSARTAN POTASSIUM 100 MG T: 100 | 30 days supply | Qty: 30 | Fill #0

## 2016-11-19 ENCOUNTER — Emergency Department (HOSPITAL_COMMUNITY)
Admission: EM | Admit: 2016-11-19 | Discharge: 2016-11-20 | Disposition: A | Discharge status: Home / Self Care | Payer: Self-pay | Attending: Physician Assistant | Admitting: Physician Assistant

## 2016-11-19 ENCOUNTER — Emergency Department (HOSPITAL_COMMUNITY): Payer: Self-pay

## 2016-11-19 ENCOUNTER — Encounter (HOSPITAL_COMMUNITY): Payer: Self-pay | Admitting: Emergency Medicine

## 2016-11-19 DIAGNOSIS — I10 Essential (primary) hypertension: Secondary | ICD-10-CM | POA: Insufficient documentation

## 2016-11-19 DIAGNOSIS — Z87891 Personal history of nicotine dependence: Secondary | ICD-10-CM | POA: Insufficient documentation

## 2016-11-19 DIAGNOSIS — Z79899 Other long term (current) drug therapy: Secondary | ICD-10-CM | POA: Insufficient documentation

## 2016-11-19 DIAGNOSIS — R0789 Other chest pain: Secondary | ICD-10-CM

## 2016-11-19 LAB — CBC WITH DIFFERENTIAL/PLATELET
Basophils Absolute: 0 10*3/uL (ref 0.0–0.1)
Basophils Relative: 0 %
Eosinophils Absolute: 0.1 10*3/uL (ref 0.0–0.7)
Eosinophils Relative: 2 %
HCT: 39.3 % (ref 39.0–52.0)
Hemoglobin: 13.6 g/dL (ref 13.0–17.0)
Lymphocytes Relative: 19 %
Lymphs Abs: 1.8 10*3/uL (ref 0.7–4.0)
MCH: 30.9 pg (ref 26.0–34.0)
MCHC: 34.6 g/dL (ref 30.0–36.0)
MCV: 89.3 fL (ref 78.0–100.0)
Monocytes Absolute: 0.6 10*3/uL (ref 0.1–1.0)
Monocytes Relative: 7 %
Neutro Abs: 6.6 10*3/uL (ref 1.7–7.7)
Neutrophils Relative %: 72 %
Platelets: 219 10*3/uL (ref 150–400)
RBC: 4.4 MIL/uL (ref 4.22–5.81)
RDW: 14 % (ref 11.5–15.5)
WBC: 9.2 10*3/uL (ref 4.0–10.5)

## 2016-11-19 LAB — BASIC METABOLIC PANEL
Anion gap: 12 (ref 5–15)
BUN: 22 mg/dL — ABNORMAL HIGH (ref 6–20)
CO2: 24 mmol/L (ref 22–32)
Calcium: 8.8 mg/dL — ABNORMAL LOW (ref 8.9–10.3)
Chloride: 102 mmol/L (ref 101–111)
Creatinine, Ser: 0.99 mg/dL (ref 0.61–1.24)
GFR calc Af Amer: >60 mL/min (ref >60)
GFR calc non Af Amer: >60 mL/min (ref >60)
Glucose, Bld: 101 mg/dL — ABNORMAL HIGH (ref 65–99)
Potassium: 4.2 mmol/L (ref 3.5–5.1)
Sodium: 138 mmol/L (ref 135–145)

## 2016-11-19 LAB — I-STAT TROPONIN, ED: Troponin i, poc: 0 ng/mL (ref 0.00–0.08)

## 2016-11-19 LAB — TROPONIN I: Troponin I: <0.03 ng/mL (ref <0.03)

## 2016-11-19 NOTE — ED Triage Notes (Signed)
Per [facility] patient complaining of left sided chest discomfort that started 2 days ago. Patient also complaining of left arm  Tingling that has been going on for 2 weeks now. Patient has reported some recent changes in his bp meds. Hx hypertension. Denies SOB,dizziness, or nausea. 12 lead unremarkable with EMS. With ems patient received 324 aspirin and 1 nitro with very mild relief. Patient alert and oriented x4 .

## 2016-11-20 NOTE — Discharge Instructions (Signed)
Return here as needed.  Follow-up with your doctor along with the cardiologist.  Return here for any changes in your condition.  Your testing here today did not show any abnormalities

## 2016-11-20 NOTE — ED Provider Notes (Signed)
[facility] DEPT Provider Note   CSN: [id number] Arrival date & time: 11/19/16  1755     History   Chief Complaint Chief Complaint  Patient presents with  . Chest Pain    HPI Juan Kline is a 46 y.o. male.  HPI Patient presents to the emergency department with chest discomfort over the last 3 days.  Patient, states she has also had some arm discomfort over the last couple weeks.  He states that his blood pressures been bouncing around.  Patient states that nothing seems make the condition better or worse.  He states that his blood pressure medications have been adjusted over the last couple weeks. The patient denies shortness of breath, headache,blurred vision, neck pain, fever, cough, weakness, numbness, dizziness, anorexia, edema, abdominal pain, nausea, vomiting, diarrhea, rash, back pain, dysuria, hematemesis, bloody stool, near syncope, or syncope. Past Medical History:  Diagnosis Date  . Chronic foot ulcer ([place]) admitted 03/02/2014   medial border first metatarsal left foot  . Hypertension Dx 2009  . Osteomyelitis left first metatarsal 03/02/2014  . Venous stasis ulcer of left lower extremity ([place])    [name] 03/02/2014    Patient Active Problem List   Diagnosis Date Noted  . Other fatigue 09/26/2016  . Dizzy 05/11/2015  . Callus of foot 05/11/2015  . Anxiety state 05/11/2015  . Former smoker 05/11/2015  . Prediabetes 11/27/2014  . Muscle spasm 08/21/2014  . Sneezing 06/28/2014  . HLD (hyperlipidemia) 05/22/2014  . Vitamin D insufficiency 05/22/2014  . Temporal pain 05/19/2014  . Numbness and tingling of foot 05/19/2014  . Venous stasis dermatitis of both lower extremities 05/19/2014  . HTN (hypertension) 03/08/2014  . Obesity 03/02/2014    Past Surgical History:  Procedure Laterality Date  . NO PAST SURGERIES         Home Medications    Prior to Admission medications   Medication Sig Start Date End Date Taking? Authorizing Provider  amLODipine  (NORVASC) 5 MG tablet Take 1 tablet (5 mg total) by mouth daily. Patient taking differently: Take 2.5 mg by mouth daily.  11/11/16  Yes Funches, Josalyn, MD  Cholecalciferol (VITAMIN D-3 PO) Take 1 capsule by mouth daily.   Yes [provider]  losartan (COZAAR) 100 MG tablet Take 1 tablet (100 mg total) by mouth daily. 11/11/16  Yes Boykin Nearing, MD    Family History Family History  Problem Relation Age of Onset  . Hypertension Mother   . Hyperlipidemia Mother   . Diabetes Mother   . Hypertension Father   . Hyperlipidemia Father   . Diabetes Brother     Social History Social History  Substance Use Topics  . Smoking status: Former Smoker    Packs/day: 0.10    Years: 26.00    Types: Cigarettes    Quit date: 02/16/2014  . Smokeless tobacco: Never Used  . Alcohol use Yes     Comment: 03/02/2014 "I rarely drink"     Allergies   Lyrica [pregabalin]   Review of Systems Review of Systems  All other systems negative except as documented in the HPI. All pertinent positives and negatives as reviewed in the HPI. Physical Exam Updated Vital Signs BP 133/87   Pulse 94   Temp 97.9 F (36.6 C) (Oral)   Resp 19   Ht 5\' 9"  (1.753 m)   Wt 127 kg (280 lb)   SpO2 95%   BMI 41.35 kg/m   Physical Exam  Constitutional: He is oriented to person, place, and  time. He appears well-developed and well-nourished. No distress.  HENT:  Head: Normocephalic and atraumatic.  Mouth/Throat: Oropharynx is clear and moist.  Eyes: Pupils are equal, round, and reactive to light.  Neck: Normal range of motion. Neck supple.  Cardiovascular: Normal rate, regular rhythm and normal heart sounds.  Exam reveals no gallop and no friction rub.   No murmur heard. Pulmonary/Chest: Effort normal and breath sounds normal. No respiratory distress. He has no wheezes.  Abdominal: Soft. Bowel sounds are normal. He exhibits no distension. There is no tenderness.  Neurological: He is alert and oriented  to person, place, and time. He exhibits normal muscle tone. Coordination normal.  Skin: Skin is warm and dry. Capillary refill takes less than 2 seconds. No rash noted. No erythema.  Psychiatric: He has a normal mood and affect. His behavior is normal.  Nursing note and vitals reviewed.    ED Treatments / Results  Labs (all labs ordered are listed, but only abnormal results are displayed) Labs Reviewed  BASIC METABOLIC PANEL - Abnormal; Notable for the following:       Result Value   Glucose, Bld 101 (*)    BUN 22 (*)    Calcium 8.8 (*)    All other components within normal limits  CBC WITH DIFFERENTIAL/PLATELET  TROPONIN I  I-STAT TROPONIN, ED    EKG  EKG Interpretation None       Radiology Dg Chest 2 View  Result Date: 11/19/2016 CLINICAL DATA:  Pt c/o chest discomfort, tightness, and left arm tingling x 3 days. Hx of HTN. Pt is a former smoker. EXAM: CHEST  2 VIEW COMPARISON:  None. FINDINGS: The heart size and mediastinal contours are within normal limits. Both lungs are clear. No pleural effusion or pneumothorax. The visualized skeletal structures are unremarkable. IMPRESSION: No active cardiopulmonary disease. Electronically Signed   By: Lajean Manes M.D.   On: 11/19/2016 20:21    Procedures Procedures (including critical care time)  Medications Ordered in ED Medications - No data to display   Initial Impression / Assessment and Plan / ED Course  I have reviewed the triage vital signs and the nursing notes.  Pertinent labs & imaging results that were available during my care of the patient were reviewed by me and considered in my medical decision making (see chart for details).     Patient will need follow-up with cardiology.  Patient is advised to return here as needed.  Patient's chest discomfort is been ongoing for 3 straight days.  Patient's discomfort is atypical for cardiac chest pain but cannot be totally excluded.  I will have him follow-up with the  cardiologist for further evaluation  Final Clinical Impressions(s) / ED Diagnoses   Final diagnoses:  None    New Prescriptions New Prescriptions   No medications on file     Dalia Heading, PA-C 11/20/16 0015    Macarthur Critchley, MD 11/23/16 1525

## 2016-12-08 MED FILL — LOSARTAN POTASSIUM 100 MG T: 100 | 30 days supply | Qty: 30 | Fill #1

## 2016-12-10 ENCOUNTER — Encounter: Payer: Self-pay | Admitting: Family Medicine

## 2016-12-10 ENCOUNTER — Ambulatory Visit: Payer: Self-pay | Attending: Family Medicine | Admitting: Family Medicine

## 2016-12-10 VITALS — BP 138/90 | HR 101 | Temp 98.2°F | Resp 18 | Ht 69.0 in | Wt 292.4 lb

## 2016-12-10 DIAGNOSIS — Z8249 Family history of ischemic heart disease and other diseases of the circulatory system: Secondary | ICD-10-CM | POA: Insufficient documentation

## 2016-12-10 DIAGNOSIS — Z79899 Other long term (current) drug therapy: Secondary | ICD-10-CM | POA: Insufficient documentation

## 2016-12-10 DIAGNOSIS — E669 Obesity, unspecified: Secondary | ICD-10-CM | POA: Insufficient documentation

## 2016-12-10 DIAGNOSIS — R079 Chest pain, unspecified: Secondary | ICD-10-CM | POA: Insufficient documentation

## 2016-12-10 DIAGNOSIS — I10 Essential (primary) hypertension: Secondary | ICD-10-CM | POA: Insufficient documentation

## 2016-12-10 DIAGNOSIS — I219 Acute myocardial infarction, unspecified: Secondary | ICD-10-CM | POA: Insufficient documentation

## 2016-12-10 DIAGNOSIS — R42 Dizziness and giddiness: Secondary | ICD-10-CM | POA: Insufficient documentation

## 2016-12-10 DIAGNOSIS — E785 Hyperlipidemia, unspecified: Secondary | ICD-10-CM | POA: Insufficient documentation

## 2016-12-10 LAB — POCT UA - MICROALBUMIN
CREATININE, POC: 300 mg/dL
MICROALBUMIN (UR) POC: 150 mg/L

## 2016-12-10 NOTE — Progress Notes (Signed)
Subjective:  Patient ID: Juan Kline, male    DOB: [birth year]  Age: 46 y.o. MRN: [id number]  CC: Hypertension   HPI Juan Kline presents for hypertension follow up. He is not exercising and is not adherent to low salt diet.  Blood pressure is not well controlled at home. He bring a log of his BP with him to office visit. He reports checking his BP different times each day but always after resting. BP log shows BP rang from  84/54 lowest to 162/107 highest. He reports adherence with BP medications, and increased dosage of amlodipine to 5 mg QD 1 wk ago. Recent history of ED visit for atypical CP. He denies any CP since discharge. Cardiac symptoms none. Patient denies chest pain, chest pressure/discomfort, claudication, dyspnea, lower extremity edema, near-syncope, palpitations and syncope. He does report dizziness upon standing and with exertion for greater than 1 year.Cardiovascular risk factors: dyslipidemia, hypertension, male gender, obesity (BMI >= 30 kg/m2) and sedentary lifestyle. Use of agents associated with hypertension: none. FH: PGF- MI; Parents- HTN. History of target organ damage: none. He reports upcoming cardiology appointment on Friday.   Outpatient Medications Prior to Visit  Medication Sig Dispense Refill  . amLODipine (NORVASC) 5 MG tablet Take 1 tablet (5 mg total) by mouth daily. (Patient taking differently: Take 2.5 mg by mouth daily. ) 30 tablet 2  . Cholecalciferol (VITAMIN D-3 PO) Take 1 capsule by mouth daily.    Marland Kitchen losartan (COZAAR) 100 MG tablet Take 1 tablet (100 mg total) by mouth daily. 90 tablet 3   No facility-administered medications prior to visit.     ROS Review of Systems  Constitutional: Negative.   Eyes: Negative.   Respiratory: Negative.   Cardiovascular: Negative.   Gastrointestinal: Negative.   Musculoskeletal: Negative.   Neurological: Positive for dizziness.   Objective:  BP 138/90 (BP Location: Left Arm, Patient Position: Sitting, Cuff Size:  Normal)   Pulse (!) 101   Temp 98.2 F (36.8 C) (Oral)   Resp 18   Ht 5\' 9"  (1.753 m)   Wt 292 lb 6.4 oz (132.6 kg)   SpO2 96%   BMI 43.18 kg/m   BP/Weight 12/10/2016 11/19/2016 7[birth year]1937  Systolic BP 902 409 735  Diastolic BP 90 94 89  Wt. (Lbs) 292.4 280 283.4  BMI 43.18 41.35 41.85   Physical Exam  Constitutional: He appears well-developed and well-nourished.  Eyes: Pupils are equal, round, and reactive to light. Conjunctivae are normal.  Neck: No JVD present.  Cardiovascular: Normal rate, regular rhythm, normal heart sounds and intact distal pulses.   Pulmonary/Chest: Effort normal and breath sounds normal.  Abdominal: Soft. Bowel sounds are normal. There is no tenderness.  Neurological: No cranial nerve deficit.  Skin: Skin is warm and dry.  Nursing note and vitals reviewed.  Assessment & Plan:   Problem List Items Addressed This Visit      Cardiovascular and Mediastinum   HTN (hypertension) - Primary (Chronic)   Follow up with cardiology appointment   Relevant Orders   Lipid Panel   POCT UA - Microalbumin (Completed)    Other Visit Diagnoses    Orthostatic dizziness       Orthostatic VS taken.   Dyslipidemia       Relevant Orders   Lipid Panel      No orders of the defined types were placed in this encounter.   Follow-up: Return in about 8 weeks (around 02/04/2017) for HTN / HLD.   [place]  FNP    

## 2016-12-10 NOTE — Patient Instructions (Addendum)
Follow up with upcoming cardiology appointment.     DASH Eating Plan DASH stands for "Dietary Approaches to Stop Hypertension." The DASH eating plan is a healthy eating plan that has been shown to reduce high blood pressure (hypertension). It may also reduce your risk for type 2 diabetes, heart disease, and stroke. The DASH eating plan may also help with weight loss. What are tips for following this plan? General guidelines  Avoid eating more than 2,300 mg (milligrams) of salt (sodium) a day. If you have hypertension, you may need to reduce your sodium intake to 1,500 mg a day.  Limit alcohol intake to no more than 1 drink a day for nonpregnant women and 2 drinks a day for men. One drink equals 12 oz of beer, 5 oz of wine, or 1 oz of hard liquor.  Work with your health care provider to maintain a healthy body weight or to lose weight. Ask what an ideal weight is for you.  Get at least 30 minutes of exercise that causes your heart to beat faster (aerobic exercise) most days of the week. Activities may include walking, swimming, or biking.  Work with your health care provider or diet and nutrition specialist (dietitian) to adjust your eating plan to your individual calorie needs. Reading food labels  Check food labels for the amount of sodium per serving. Choose foods with less than 5 percent of the Daily Value of sodium. Generally, foods with less than 300 mg of sodium per serving fit into this eating plan.  To find whole grains, look for the word "whole" as the first word in the ingredient list. Shopping  Buy products labeled as "low-sodium" or "no salt added."  Buy fresh foods. Avoid canned foods and premade or frozen meals. Cooking  Avoid adding salt when cooking. Use salt-free seasonings or herbs instead of table salt or sea salt. Check with your health care provider or pharmacist before using salt substitutes.  Do not fry foods. Cook foods using healthy methods such as baking,  boiling, grilling, and broiling instead.  Cook with heart-healthy oils, such as olive, canola, soybean, or sunflower oil. Meal planning   Eat a balanced diet that includes: ? 5 or more servings of fruits and vegetables each day. At each meal, try to fill half of your plate with fruits and vegetables. ? Up to 6-8 servings of whole grains each day. ? Less than 6 oz of lean meat, poultry, or fish each day. A 3-oz serving of meat is about the same size as a deck of cards. One egg equals 1 oz. ? 2 servings of low-fat dairy each day. ? A serving of nuts, seeds, or beans 5 times each week. ? Heart-healthy fats. Healthy fats called Omega-3 fatty acids are found in foods such as flaxseeds and coldwater fish, like sardines, salmon, and mackerel.  Limit how much you eat of the following: ? Canned or prepackaged foods. ? Food that is high in trans fat, such as fried foods. ? Food that is high in saturated fat, such as fatty meat. ? Sweets, desserts, sugary drinks, and other foods with added sugar. ? Full-fat dairy products.  Do not salt foods before eating.  Try to eat at least 2 vegetarian meals each week.  Eat more home-cooked food and less restaurant, buffet, and fast food.  When eating at a restaurant, ask that your food be prepared with less salt or no salt, if possible. What foods are recommended? The items listed may not  be a complete list. Talk with your dietitian about what dietary choices are best for you. Grains Whole-grain or whole-wheat bread. Whole-grain or whole-wheat pasta. Brown rice. [place]row. Bulgur. Whole-grain and low-sodium cereals. Pita bread. Low-fat, low-sodium crackers. Whole-wheat flour tortillas. Vegetables Fresh or frozen vegetables (raw, steamed, roasted, or grilled). Low-sodium or reduced-sodium tomato and vegetable juice. Low-sodium or reduced-sodium tomato sauce and tomato paste. Low-sodium or reduced-sodium canned vegetables. Fruits All fresh, dried, or  frozen fruit. Canned fruit in natural juice (without added sugar). Meat and other protein foods Skinless chicken or [place]. Ground chicken or [place]. Pork with fat trimmed off. Fish and seafood. Egg whites. Dried beans, peas, or lentils. Unsalted nuts, nut butters, and seeds. Unsalted canned beans. Lean cuts of beef with fat trimmed off. Low-sodium, lean deli meat. Dairy Low-fat (1%) or fat-free (skim) milk. Fat-free, low-fat, or reduced-fat cheeses. Nonfat, low-sodium ricotta or cottage cheese. Low-fat or nonfat yogurt. Low-fat, low-sodium cheese. Fats and oils Soft margarine without trans fats. Vegetable oil. Low-fat, reduced-fat, or light mayonnaise and salad dressings (reduced-sodium). Canola, safflower, olive, soybean, and sunflower oils. Avocado. Seasoning and other foods Herbs. Spices. Seasoning mixes without salt. Unsalted popcorn and pretzels. Fat-free sweets. What foods are not recommended? The items listed may not be a complete list. Talk with your dietitian about what dietary choices are best for you. Grains Baked goods made with fat, such as croissants, muffins, or some breads. Dry pasta or rice meal packs. Vegetables Creamed or fried vegetables. Vegetables in a cheese sauce. Regular canned vegetables (not low-sodium or reduced-sodium). Regular canned tomato sauce and paste (not low-sodium or reduced-sodium). Regular tomato and vegetable juice (not low-sodium or reduced-sodium). Angie Fava. Olives. Fruits Canned fruit in a light or heavy syrup. Fried fruit. Fruit in cream or butter sauce. Meat and other protein foods Fatty cuts of meat. Ribs. Fried meat. Berniece Salines. Sausage. Bologna and other processed lunch meats. Salami. Fatback. Hotdogs. Bratwurst. Salted nuts and seeds. Canned beans with added salt. Canned or smoked fish. Whole eggs or egg yolks. Chicken or [place] with skin. Dairy Whole or 2% milk, cream, and half-and-half. Whole or full-fat cream cheese. Whole-fat or sweetened yogurt.  Full-fat cheese. Nondairy creamers. Whipped toppings. Processed cheese and cheese spreads. Fats and oils Butter. Stick margarine. Lard. Shortening. Ghee. Bacon fat. Tropical oils, such as coconut, palm kernel, or palm oil. Seasoning and other foods Salted popcorn and pretzels. Onion salt, garlic salt, seasoned salt, table salt, and sea salt. Worcestershire sauce. Tartar sauce. Barbecue sauce. Teriyaki sauce. Soy sauce, including reduced-sodium. Steak sauce. Canned and packaged gravies. Fish sauce. Oyster sauce. Cocktail sauce. Horseradish that you find on the shelf. Ketchup. Mustard. Meat flavorings and tenderizers. Bouillon cubes. Hot sauce and Tabasco sauce. Premade or packaged marinades. Premade or packaged taco seasonings. Relishes. Regular salad dressings. Where to find more information:  National Heart, Lung, and [place]: https://wilson-eaton.com/  American Heart Association: www.heart.org Summary  The DASH eating plan is a healthy eating plan that has been shown to reduce high blood pressure (hypertension). It may also reduce your risk for type 2 diabetes, heart disease, and stroke.  With the DASH eating plan, you should limit salt (sodium) intake to 2,300 mg a day. If you have hypertension, you may need to reduce your sodium intake to 1,500 mg a day.  When on the DASH eating plan, aim to eat more fresh fruits and vegetables, whole grains, lean proteins, low-fat dairy, and heart-healthy fats.  Work with your health care provider or diet and nutrition  specialist (dietitian) to adjust your eating plan to your individual calorie needs. This information is not intended to replace advice given to you by your health care provider. Make sure you discuss any questions you have with your health care provider. Document Released: 03/20/2011 Document Revised: 03/24/2016 Document Reviewed: 03/24/2016 Elsevier Interactive Patient Education  2017 [employer].

## 2016-12-11 LAB — LIPID PANEL
CHOL/HDL RATIO: 3.9 ratio (ref 0.0–5.0)
Cholesterol, Total: 285 mg/dL — ABNORMAL HIGH (ref 100–199)
HDL: 73 mg/dL (ref >39)
LDL Calculated: 163 mg/dL — ABNORMAL HIGH (ref 0–99)
TRIGLYCERIDES: 247 mg/dL — AB (ref 0–149)
VLDL Cholesterol Cal: 49 mg/dL — ABNORMAL HIGH (ref 5–40)

## 2016-12-12 ENCOUNTER — Encounter: Payer: Self-pay | Admitting: Cardiology

## 2016-12-12 ENCOUNTER — Ambulatory Visit (INDEPENDENT_AMBULATORY_CARE_PROVIDER_SITE_OTHER): Payer: Self-pay | Admitting: Cardiology

## 2016-12-12 DIAGNOSIS — R0789 Other chest pain: Secondary | ICD-10-CM | POA: Insufficient documentation

## 2016-12-12 DIAGNOSIS — R079 Chest pain, unspecified: Secondary | ICD-10-CM

## 2016-12-12 DIAGNOSIS — E784 Other hyperlipidemia: Secondary | ICD-10-CM

## 2016-12-12 DIAGNOSIS — E7849 Other hyperlipidemia: Secondary | ICD-10-CM

## 2016-12-12 DIAGNOSIS — I10 Essential (primary) hypertension: Secondary | ICD-10-CM

## 2016-12-12 DIAGNOSIS — G629 Polyneuropathy, unspecified: Secondary | ICD-10-CM | POA: Insufficient documentation

## 2016-12-12 DIAGNOSIS — G609 Hereditary and idiopathic neuropathy, unspecified: Secondary | ICD-10-CM

## 2016-12-12 NOTE — Assessment & Plan Note (Signed)
Pt has labile (high) B/P and has had HTN for years. Medications being adjusted by his PCP

## 2016-12-12 NOTE — Assessment & Plan Note (Signed)
Pt seen in the ED 11/20/16 with HTN and chest pain-referred to [name] for further evaluation

## 2016-12-12 NOTE — Patient Instructions (Signed)
Medication Instructions: Your physician recommends that you continue on your current medications as directed. Please refer to the Current Medication list given to you today.  If you need a refill on your cardiac medications before your next appointment, please call your pharmacy.    Procedures/Testing: Your physician has requested that you have an exercise tolerance test. For further information please visit HugeFiesta.tn. This will be done at [place], [address]  Your physician has requested that you have an echocardiogram. Echocardiography is a painless test that uses sound waves to create images of your heart. It provides your doctor with information about the size and shape of your heart and how well your heart's chambers and valves are working. This procedure takes approximately one hour. There are no restrictions for this procedure. This will be done at [place], [address]   Follow-Up: Your physician wants you to follow-up with Dr. Debara Pickett after the tests have been completed. You will receive a reminder letter in the mail two months in advance. If you don't receive a letter, please call our office to schedule this follow-up appointment.    Thank you for choosing [facility]!!

## 2016-12-12 NOTE — Assessment & Plan Note (Signed)
Recent lipid panel done by PCP

## 2016-12-12 NOTE — Assessment & Plan Note (Addendum)
BMI 45-sleep study 10 yrs ago- never followed up

## 2016-12-12 NOTE — Assessment & Plan Note (Signed)
Numbness and tingling in his LE- intolerant to Lyrica and Neurontin didn't help

## 2016-12-12 NOTE — Progress Notes (Signed)
12/12/2016 [place] Bars   July 08, 1970  [id number]  Primary Physician Alfonse Spruce, FNP Primary Cardiologist: Dr Debara Pickett (new)  HPI:  46 y/o obese, unemployed male, here with his mother after an ED visit 11/20/16 with chest pain and uncontrolled HTN. He says he has had HTN for > 10 yrs. He had "fluid overload" 10 yrs ago and was worked up at [facility]. He remembers having a sleep study but never followed up. He is followed now at [facility]. He does not have DM- Hgb A1c was 5.4 in June. He does have LE neuropathy and anxiety. He has had 3 visits to the ED this summer for HTN (first two) and chest pain and HTN the last one. His Troponin has been negative. EKG - sinus tach and IVCD.   Current Outpatient Prescriptions  Medication Sig Dispense Refill  . amLODipine (NORVASC) 5 MG tablet Take 1 tablet (5 mg total) by mouth daily. (Patient taking differently: Take 2.5 mg by mouth daily. ) 30 tablet 2  . Cholecalciferol (VITAMIN D-3 PO) Take 1 capsule by mouth daily.    Marland Kitchen losartan (COZAAR) 100 MG tablet Take 1 tablet (100 mg total) by mouth daily. 90 tablet 3   No current facility-administered medications for this visit.     Allergies  Allergen Reactions  . Lyrica [Pregabalin] Anxiety    Past Medical History:  Diagnosis Date  . Chronic foot ulcer ([place]) admitted 03/02/2014   medial border first metatarsal left foot  . Hypertension Dx 2009  . Osteomyelitis left first metatarsal 03/02/2014  . Venous stasis ulcer of left lower extremity ([place])    [name] 03/02/2014    Social History   Social History  . Marital status: Single    Spouse name: N/A  . Number of children: N/A  . Years of education: N/A   Occupational History  . Not on file.   Social History Main Topics  . Smoking status: Former Smoker    Packs/day: 0.10    Years: 26.00    Types: Cigarettes    Quit date: 02/16/2014  . Smokeless tobacco: Never Used  . Alcohol use Yes     Comment: 03/02/2014 "I rarely drink"  .  Drug use: No  . Sexual activity: No   Other Topics Concern  . Not on file   Social History Narrative  . No narrative on file     Family History  Problem Relation Age of Onset  . Hypertension Mother   . Hyperlipidemia Mother   . Diabetes Mother   . Hypertension Father   . Hyperlipidemia Father   . Diabetes Brother   . Heart disease Maternal Grandmother   . Heart attack Paternal Grandmother   . Heart disease Paternal Grandfather      Review of Systems: General: negative for chills, fever, night sweats or weight changes.  Cardiovascular: negative for chest pain, dyspnea on exertion, edema, orthopnea, palpitations, paroxysmal nocturnal dyspnea or shortness of breath Dermatological: negative for rash Respiratory: negative for cough or wheezing Urologic: negative for hematuria Abdominal: negative for nausea, vomiting, diarrhea, bright red blood per rectum, melena, or hematemesis Neurologic: negative for visual changes, syncope, or dizziness All other systems reviewed and are otherwise negative except as noted above.    Blood pressure (!) 167/116, pulse 98, height 5\' 9"  (1.753 m), weight 294 lb (133.4 kg).  General appearance: alert, cooperative, no distress and moderately obese Neck: no carotid bruit and no JVD Lungs: clear to auscultation bilaterally Heart: regular rate  and rhythm Abdomen: obese,  non tender Extremities: chronic venous skin changes and varicosities Pulses: 2+ and symmetric Skin: Skin color, texture, turgor normal. 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 Medications being adjusted by his PCP  HLD (hyperlipidemia) Recent lipid panel done by PCP  Morbid obesity ([place]) BMI 45-sleep study 10 yrs ago-  never followed up  Peripheral neuropathy Numbness and tingling in his LE- intolerant to Lyrica and Neurontin didn't help   PLAN  I will defer Rx of his HTN and HLD to his PCP. Check echo and POET- f/u with MD after the above.   Kerin Ransom PA-C 12/12/2016 3:29 PM

## 2016-12-16 ENCOUNTER — Other Ambulatory Visit: Payer: Self-pay | Admitting: Family Medicine

## 2016-12-16 ENCOUNTER — Telehealth: Payer: Self-pay

## 2016-12-16 DIAGNOSIS — E782 Mixed hyperlipidemia: Secondary | ICD-10-CM

## 2016-12-16 MED ORDER — ATORVASTATIN CALCIUM 20 MG PO TABS: 20.0000 mg | ORAL_TABLET | Freq: Every day | ORAL | 2 refills | Status: DC

## 2016-12-16 MED FILL — ATORVASTATIN 20 MG TABLET: 20 | 30 days supply | Qty: 30 | Fill #0

## 2016-12-16 NOTE — Telephone Encounter (Signed)
CMA call regarding lab results   Patient verify dOB   Patient was aware and understood

## 2016-12-16 NOTE — Telephone Encounter (Signed)
-----   Message from Alfonse Spruce, [place] sent at 12/16/2016  7:20 AM EDT ----- Lipid levels were elevated. This can increase your risk of heart disease overtime. You will be prescribed atorvastatin. Start eating a diet low in saturated fat. Limit your intake of fried foods, red meats, and whole milk. Increase activity. Recommend follow up in 3 months.

## 2016-12-17 ENCOUNTER — Telehealth (HOSPITAL_COMMUNITY): Payer: Self-pay

## 2016-12-17 NOTE — Telephone Encounter (Signed)
Encounter complete. 

## 2016-12-19 ENCOUNTER — Ambulatory Visit (HOSPITAL_COMMUNITY)
Admission: RE | Admit: 2016-12-19 | Discharge: 2016-12-19 | Disposition: A | Discharge status: Home / Self Care | Payer: Self-pay | Source: Ambulatory Visit | Attending: Cardiovascular Disease | Admitting: Cardiovascular Disease

## 2016-12-19 ENCOUNTER — Inpatient Hospital Stay (HOSPITAL_COMMUNITY): Admission: RE | Admit: 2016-12-19 | Payer: Self-pay | Source: Ambulatory Visit

## 2016-12-19 DIAGNOSIS — R079 Chest pain, unspecified: Secondary | ICD-10-CM | POA: Insufficient documentation

## 2016-12-19 LAB — EXERCISE TOLERANCE TEST
Estimated workload: 7 METS
Exercise duration (min): 6 min
Exercise duration (sec): 5 s
MPHR: 175 {beats}/min
Peak HR: 151 {beats}/min
Percent HR: 86 %
RPE: 18
Rest HR: 83 {beats}/min

## 2016-12-19 MED FILL — AMLODIPINE BESYLATE 5 MG TA: 5 | 30 days supply | Qty: 30 | Fill #1

## 2016-12-24 ENCOUNTER — Other Ambulatory Visit: Payer: Self-pay

## 2016-12-24 ENCOUNTER — Ambulatory Visit (HOSPITAL_COMMUNITY): Payer: Self-pay | Attending: Cardiovascular Disease

## 2016-12-24 DIAGNOSIS — I5030 Unspecified diastolic (congestive) heart failure: Secondary | ICD-10-CM | POA: Insufficient documentation

## 2016-12-24 DIAGNOSIS — I059 Rheumatic mitral valve disease, unspecified: Secondary | ICD-10-CM | POA: Insufficient documentation

## 2016-12-24 DIAGNOSIS — I10 Essential (primary) hypertension: Secondary | ICD-10-CM

## 2016-12-24 DIAGNOSIS — R079 Chest pain, unspecified: Secondary | ICD-10-CM

## 2016-12-24 DIAGNOSIS — I420 Dilated cardiomyopathy: Secondary | ICD-10-CM | POA: Insufficient documentation

## 2017-01-12 MED FILL — LOSARTAN POTASSIUM 100 MG T: 100 | 30 days supply | Qty: 30 | Fill #2

## 2017-01-12 MED FILL — ?ATORVASTATIN 20 MG TABLET: 20 | 30 days supply | Qty: 30 | Fill #1

## 2017-01-21 MED FILL — AMLODIPINE BESYLATE 5 MG TA: 5 | 30 days supply | Qty: 30 | Fill #2

## 2017-01-22 ENCOUNTER — Encounter: Payer: Self-pay | Admitting: Internal Medicine

## 2017-01-22 ENCOUNTER — Ambulatory Visit (INDEPENDENT_AMBULATORY_CARE_PROVIDER_SITE_OTHER): Payer: Self-pay | Admitting: Internal Medicine

## 2017-01-22 VITALS — BP 140/92 | HR 98 | Ht 70.0 in | Wt 294.0 lb

## 2017-01-22 DIAGNOSIS — R079 Chest pain, unspecified: Secondary | ICD-10-CM

## 2017-01-22 DIAGNOSIS — I10 Essential (primary) hypertension: Secondary | ICD-10-CM

## 2017-01-22 DIAGNOSIS — E7849 Other hyperlipidemia: Secondary | ICD-10-CM

## 2017-01-22 MED ORDER — HYDROCHLOROTHIAZIDE 12.5 MG PO TABS: 12.5000 mg | ORAL_TABLET | Freq: Every day | ORAL | 3 refills | Status: DC

## 2017-01-22 MED FILL — ?HYDROCHLOROTHIAZIDE 12.5MG: 12.5 | 30 days supply | Qty: 30 | Fill #0

## 2017-01-22 NOTE — Progress Notes (Signed)
OFFICE NOTE  Chief Complaint:  Follow-up testing  Primary Care Physician: Alfonse Spruce, FNP  HPI:  Juan Kline is a 46 y.o. male with a past medial history significant for hypertension, venous stasis ulcers and osteomyelitis of the first left metatarsal, who recently was in the emergency department with chest pain and uncontrolled hypertension. He said he knew he had hypertension for more than 10 years. He's not been on any medications. He said he had a sleep study number of years ago but was not diagnosed with OSA, but did not follow-up with the study. He was seen 2 other times in the emergency department the summer for chest pain and hypertension as well. Recently saw Kerin Ransom, PA-C, who ordered an exercise tolerance test which was negative. The patient exercised for 6 minutes on the Bruce protocol to max predicted heart rate of 86%. Normal blood pressure response and negative for ischemia. He also had an echocardiogram which demonstrated an EF of 55-60%, normal wall motion and grade 1 diastolic dysfunction with mild left atrial enlargement. He reports she's had no further chest pain symptoms since then.  PMHx:  Past Medical History:  Diagnosis Date  . Chronic foot ulcer ([place]) admitted 03/02/2014   medial border first metatarsal left foot  . Hypertension Dx 2009  . Osteomyelitis left first metatarsal 03/02/2014  . Venous stasis ulcer of left lower extremity ([place])    [name] 03/02/2014    Past Surgical History:  Procedure Laterality Date  . NO PAST SURGERIES      FAMHx:  Family History  Problem Relation Age of Onset  . Hypertension Mother   . Hyperlipidemia Mother   . Diabetes Mother   . Kidney disease Mother   . Hypertension Father   . Hyperlipidemia Father   . Diabetes Brother   . Heart disease Maternal Grandmother   . Diabetes Maternal Grandfather   . Heart attack Paternal Grandmother   . Heart disease Paternal Grandfather     SOCHx:   reports that he quit  smoking about 2 years ago. His smoking use included Cigarettes. He has a 2.60 pack-year smoking history. He has never used smokeless tobacco. He reports that he drinks alcohol. He reports that he does not use drugs.  ALLERGIES:  Allergies  Allergen Reactions  . Lyrica [Pregabalin] Anxiety    ROS: Pertinent items noted in HPI and remainder of comprehensive ROS otherwise negative.  HOME MEDS: Current Outpatient Prescriptions on File Prior to Visit  Medication Sig Dispense Refill  . amLODipine (NORVASC) 5 MG tablet Take 1 tablet (5 mg total) by mouth daily. (Patient taking differently: Take 2.5 mg by mouth daily. ) 30 tablet 2  . atorvastatin (LIPITOR) 20 MG tablet Take 1 tablet (20 mg total) by mouth daily. 30 tablet 2  . Cholecalciferol (VITAMIN D-3 PO) Take 1 capsule by mouth daily.    Marland Kitchen losartan (COZAAR) 100 MG tablet Take 1 tablet (100 mg total) by mouth daily. 90 tablet 3   No current facility-administered medications on file prior to visit.     LABS/IMAGING: No results found for this or any previous visit (from the past 48 hour(s)). No results found.  LIPID PANEL:    Component Value Date/Time   CHOL 285 (H) 12/10/2016 1144   TRIG 247 (H) 12/10/2016 1144   HDL 73 12/10/2016 1144   CHOLHDL 3.9 12/10/2016 1144   CHOLHDL 5.6 (H) 02/28/2016 1244   VLDL 65 (H) 02/28/2016 1244   LDLCALC 163 (H) 12/10/2016 1144  WEIGHTS: Wt Readings from Last 3 Encounters:  01/22/17 294 lb (133.4 kg)  12/12/16 294 lb (133.4 kg)  12/10/16 292 lb 6.4 oz (132.6 kg)    VITALS: BP (!) 140/92   Pulse 98   Ht 5\' 10"  (1.778 m)   Wt 294 lb (133.4 kg)   BMI 42.18 kg/m   EXAM: Deferred  EKG: Deferred  ASSESSMENT: 1. Recurrent chest pain-negative exercise treadmill stress test and normal LVEF on echo (12/2016) 2. Hypertension - not at goal 3. History of possible sleep apnea  PLAN: 1.   [name] continues to be hypertensive. I think he benefit from the addition of low-dose diuretic.  Will start HCTZ 12.5 mg daily. If this is well-tolerated could be combined with his losartan to ease with compliance of medications. He should continue on amlodipine and low-dose atorvastatin with goal LDL less than 100. Plan to check a metabolic profile in one week. He should follow-up with his primary care physician at the scheduled appointment on October 30. Otherwise follow-up with me in cardiology as needed.  Pixie Casino, MD, [place]  Attending Cardiologist  Direct Dial: [phone]  Fax: [phone]  Website:  [email or web address] 01/22/2017, 11:24 AM

## 2017-01-22 NOTE — Patient Instructions (Addendum)
Your physician has recommended you make the following change in your medication:  -- START hydrochlorothiazide 12.5 mg once daily (in addition to current medications)  -- your PCP can combine this med with losartan if this is working to lower your BP  Your physician recommends that you schedule a follow-up appointment as needed

## 2017-02-04 ENCOUNTER — Ambulatory Visit: Payer: Self-pay | Admitting: Family Medicine

## 2017-02-09 MED FILL — LOSARTAN POTASSIUM 100 MG T: 100 | 30 days supply | Qty: 30 | Fill #3

## 2017-02-10 ENCOUNTER — Encounter: Payer: Self-pay | Admitting: Family Medicine

## 2017-02-10 ENCOUNTER — Ambulatory Visit: Payer: Self-pay | Attending: Family Medicine | Admitting: Family Medicine

## 2017-02-10 VITALS — BP 127/82 | HR 93 | Temp 98.2°F | Resp 18 | Ht 70.0 in | Wt 297.4 lb

## 2017-02-10 DIAGNOSIS — R0683 Snoring: Secondary | ICD-10-CM | POA: Insufficient documentation

## 2017-02-10 DIAGNOSIS — Z6830 Body mass index (BMI) 30.0-30.9, adult: Secondary | ICD-10-CM | POA: Insufficient documentation

## 2017-02-10 DIAGNOSIS — I10 Essential (primary) hypertension: Secondary | ICD-10-CM | POA: Insufficient documentation

## 2017-02-10 DIAGNOSIS — Z79899 Other long term (current) drug therapy: Secondary | ICD-10-CM | POA: Insufficient documentation

## 2017-02-10 DIAGNOSIS — Z23 Encounter for immunization: Secondary | ICD-10-CM | POA: Insufficient documentation

## 2017-02-10 DIAGNOSIS — Z8249 Family history of ischemic heart disease and other diseases of the circulatory system: Secondary | ICD-10-CM | POA: Insufficient documentation

## 2017-02-10 DIAGNOSIS — E785 Hyperlipidemia, unspecified: Secondary | ICD-10-CM

## 2017-02-10 DIAGNOSIS — E782 Mixed hyperlipidemia: Secondary | ICD-10-CM | POA: Insufficient documentation

## 2017-02-10 MED ORDER — ATORVASTATIN CALCIUM 20 MG PO TABS: 20.0000 mg | ORAL_TABLET | Freq: Every day | ORAL | 1 refills | Status: DC

## 2017-02-10 MED ORDER — AMLODIPINE BESYLATE 5 MG PO TABS: 5.0000 mg | ORAL_TABLET | Freq: Every day | ORAL | 1 refills | Status: DC

## 2017-02-10 MED ORDER — LOSARTAN POTASSIUM 100 MG PO TABS: 100.0000 mg | ORAL_TABLET | Freq: Every day | ORAL | 1 refills | Status: DC

## 2017-02-10 NOTE — Progress Notes (Signed)
Patient is here for hTN

## 2017-02-10 NOTE — Progress Notes (Signed)
Subjective:  Patient ID: Juan Kline, male    DOB: 1971/03/06  Age: 46 y.o. MRN: [id number]  CC: Hypertension   HPI Juan Kline presents for hypertension follow up. He is not exercising and is not adherent to low salt diet.  Blood pressure is well controlled at home. He reports adherence with BP medications, per last cardiologist visit [place] was added.  Cardiac symptoms none. Patient denies chest pain, chest pressure/discomfort, claudication, dyspnea, lower extremity edema, near-syncope, palpitations and syncope. Cardiovascular risk factors: dyslipidemia, hypertension, male gender, obesity (BMI >= 30 kg/m2) and sedentary lifestyle. Use of agents associated with hypertension: none. FH: PGF- MI; Parents- HTN. History of target organ damage: none. He does report 10 years ago recommendation for sleep study at [facility] for possible OSA but did not follow through. He does report history of snoring.     Outpatient Medications Prior to Visit  Medication Sig Dispense Refill  . Cholecalciferol (VITAMIN D-3 PO) Take 1 capsule by mouth daily.    . hydrochlorothiazide (HYDRODIURIL) 12.5 MG tablet Take 1 tablet (12.5 mg total) by mouth daily. 90 tablet 3  . amLODipine (NORVASC) 5 MG tablet Take 1 tablet (5 mg total) by mouth daily. (Patient taking differently: Take 2.5 mg by mouth daily. ) 30 tablet 2  . atorvastatin (LIPITOR) 20 MG tablet Take 1 tablet (20 mg total) by mouth daily. 30 tablet 2  . losartan (COZAAR) 100 MG tablet Take 1 tablet (100 mg total) by mouth daily. 90 tablet 3   No facility-administered medications prior to visit.     ROS Review of Systems  Constitutional: Negative.   HENT: Negative.   Eyes: Negative.   Respiratory: Negative.   Cardiovascular: Negative.   Gastrointestinal: Negative.   Musculoskeletal: Negative.    Objective:  BP 127/82 (BP Location: Left Arm, Patient Position: Sitting, Cuff Size: Normal)   Pulse 93   Temp 98.2 F (36.8 C) (Oral)   Resp 18   Ht 5\' 10"  (1.778  m)   Wt 297 lb 6.4 oz (134.9 kg)   SpO2 94%   BMI 42.67 kg/m   BP/Weight 02/10/2017 01/22/2017 5/95/6387  Systolic BP 564 332 951  Diastolic BP 82 92 884  Wt. (Lbs) 297.4 294 294  BMI 42.67 42.18 43.42   Physical Exam  Constitutional: He appears well-developed and well-nourished.  HENT:  Head: Normocephalic and atraumatic.  Right Ear: External ear normal.  Left Ear: External ear normal.  Nose: Nose normal.  Mouth/Throat: Oropharynx is clear and moist.  Eyes: Pupils are equal, round, and reactive to light. Conjunctivae are normal.  Neck: No JVD present.  Cardiovascular: Normal rate, regular rhythm, normal heart sounds and intact distal pulses.   Pulmonary/Chest: Effort normal and breath sounds normal.  Abdominal: Soft. Bowel sounds are normal. There is no tenderness.  Neurological: No cranial nerve deficit.  Skin: Skin is warm and dry.  Nursing note and vitals reviewed.  Assessment & Plan:   1. Essential hypertension BP controlled on current medications. Cont. htcz - amLODipine (NORVASC) 5 MG tablet; Take 1 tablet (5 mg total) by mouth daily.  Dispense: 90 tablet; Refill: 1 - losartan (COZAAR) 100 MG tablet; Take 1 tablet (100 mg total) by mouth daily.  Dispense: 90 tablet; Refill: 1  2. Hyperlipidemia, unspecified hyperlipidemia type  - atorvastatin (LIPITOR) 20 MG tablet; Take 1 tablet (20 mg total) by mouth daily.  Dispense: 90 tablet; Refill: 1  3. Snoring Referral for sleep study - Ambulatory referral to Neurology  4. Morbid  obesity ([place])  - Ambulatory referral to Neurology  5. Mixed hyperlipidemia  - atorvastatin (LIPITOR) 20 MG tablet; Take 1 tablet (20 mg total) by mouth daily.  Dispense: 90 tablet; Refill: 1  6. Needs flu shot  - Flu Vaccine QUAD 36+ mos IM     Follow-up: Return in about 6 months (around 08/11/2017) for HTN.   Alfonse Spruce FNP

## 2017-02-10 NOTE — Patient Instructions (Signed)
Heart Disease Prevention Heart disease is a leading cause of death. There are many things you can do to help prevent heart disease. Be physically active Physical activity is good for your heart. It helps control your blood pressure, cholesterol levels, and weight. Try to be physically active every day. Ask your health care provider what activities are best for you. Be a healthy weight Extra weight can strain your heart and affect your blood pressure and cholesterol levels. Lose weight with diet and exercise if recommended by your health care provider. Eat heart-healthy foods Follow a healthy eating plan as recommended by your health care provider or dietitian. Heart-healthy foods include:  High-fiber foods. These include oat bran, oatmeal, and whole-grain breads and cereals.  Fruits and vegetables.  Avoid:  Alcohol.  Fried foods.  Foods high in saturated fat. These include meats, butter, whole dairy products, shortening, and coconut or palm oil.  Salty foods. These include canned food, luncheon meat, salty snacks, and fast food.  Keep your cholesterol levels under control Cholesterol is a substance that is used for many important functions. When your cholesterol levels are high, cholesterol can stick to the insides of your blood vessels, making them narrow or clog. This can lead to chest pain (angina) and a heart attack. Keep your cholesterol levels under control as recommended by your health care provider. Have your cholesterol checked at least once a year. Target cholesterol levels (in mg/dL) for most people are:  Total cholesterol below 200.  LDL cholesterol below 100.  HDL cholesterol above 40 in men and above 50 in women.  Triglycerides below 150.  Keep your blood pressure under control Having high blood pressure (hypertension) puts you at risk for stroke and other forms of heart disease. Keep your blood pressure under control as recommended by your health care provider. Ask  your health care provider if you need treatment to lower your blood pressure. If you are 93-49 years of age, have your blood pressure checked every 3-5 years. If you are 7 years of age or older, have your blood pressure checked every year. Do not use tobacco products Tobacco smoke can damage your heart and blood vessels. Do not use any tobacco products including cigarettes, chewing tobacco, or electronic cigarettes. If you need help quitting, ask your health care provider. Take medicines as directed Take medicines only as directed by your health care provider. Ask your health care provider whether you should take an aspirin every day. Taking aspirin can help reduce your risk of heart disease and stroke. Where to find more information: To find out more about heart disease, visit the American Heart Association's website at www.americanheart.org This information is not intended to replace advice given to you by your health care provider. Make sure you discuss any questions you have with your health care provider. Document Released: 11/13/2003 Document Revised: 08/29/2015 Document Reviewed: 05/25/2013 Elsevier Interactive Patient Education  2017 [place]. Influenza Virus Vaccine injection (Fluarix) What is this medicine? INFLUENZA VIRUS VACCINE (in floo EN zuh VAHY ruhs vak SEEN) helps to reduce the risk of getting influenza also known as the flu. This medicine may be used for other purposes; ask your health care provider or pharmacist if you have questions. COMMON BRAND NAME(S): Fluarix, Fluzone What should I tell my health care provider before I take this medicine? They need to know if you have any of these conditions: -bleeding disorder like hemophilia -fever or infection -Guillain-Barre syndrome or other neurological problems -immune system problems -infection with  the human immunodeficiency virus (HIV) or AIDS -low blood platelet counts -multiple sclerosis -an unusual or allergic  reaction to influenza virus vaccine, eggs, chicken proteins, latex, gentamicin, other medicines, foods, dyes or preservatives -pregnant or trying to get pregnant -breast-feeding How should I use this medicine? This vaccine is for injection into a muscle. It is given by a health care professional. A copy of Vaccine Information Statements will be given before each vaccination. Read this sheet carefully each time. The sheet may change frequently. Talk to your pediatrician regarding the use of this medicine in children. Special care may be needed. Overdosage: If you think you have taken too much of this medicine contact a poison control center or emergency room at once. NOTE: This medicine is only for you. Do not share this medicine with others. What if I miss a dose? This does not apply. What may interact with this medicine? -chemotherapy or radiation therapy -medicines that lower your immune system like etanercept, anakinra, infliximab, and adalimumab -medicines that treat or prevent blood clots like warfarin -phenytoin -steroid medicines like prednisone or cortisone -theophylline -vaccines This list may not describe all possible interactions. Give your health care provider a list of all the medicines, herbs, non-prescription drugs, or dietary supplements you use. Also tell them if you smoke, drink alcohol, or use illegal drugs. Some items may interact with your medicine. What should I watch for while using this medicine? Report any side effects that do not go away within 3 days to your doctor or health care professional. Call your health care provider if any unusual symptoms occur within 6 weeks of receiving this vaccine. You may still catch the flu, but the illness is not usually as bad. You cannot get the flu from the vaccine. The vaccine will not protect against colds or other illnesses that may cause fever. The vaccine is needed every year. What side effects may I notice from receiving this  medicine? Side effects that you should report to your doctor or health care professional as soon as possible: -allergic reactions like skin rash, itching or hives, swelling of the face, lips, or tongue Side effects that usually do not require medical attention (report to your doctor or health care professional if they continue or are bothersome): -fever -headache -muscle aches and pains -pain, tenderness, redness, or swelling at site where injected -weak or tired This list may not describe all possible side effects. Call your doctor for medical advice about side effects. You may report side effects to FDA at 1-800-FDA-1088. Where should I keep my medicine? This vaccine is only given in a clinic, pharmacy, doctor's office, or other health care setting and will not be stored at home. NOTE: This sheet is a summary. It may not cover all possible information. If you have questions about this medicine, talk to your doctor, pharmacist, or health care provider.  2018 Elsevier/Gold Standard (2007-10-27 09:30:40)

## 2017-02-23 MED FILL — AMLODIPINE BESYLATE 5 MG TA: 5 | 30 days supply | Qty: 30 | Fill #0

## 2017-02-23 MED FILL — ?HYDROCHLOROTHIAZIDE 12.5MG: 12.5 | 30 days supply | Qty: 30 | Fill #1

## 2017-03-13 MED FILL — LOSARTAN POTASSIUM 100 MG T: 100 | 30 days supply | Qty: 30 | Fill #4

## 2017-03-25 MED FILL — AMLODIPINE BESYLATE 5 MG TA: 5 | 30 days supply | Qty: 30 | Fill #1

## 2017-03-25 MED FILL — HYDROCHLOROTHIAZIDE 12.5 MG: 12.5 | 30 days supply | Qty: 30 | Fill #2

## 2017-04-16 MED FILL — LOSARTAN POTASSIUM 100 MG T: 100 | 30 days supply | Qty: 30 | Fill #5

## 2017-04-27 MED FILL — HYDROCHLOROTHIAZIDE 12.5 MG: 12.5 | 30 days supply | Qty: 30 | Fill #3

## 2017-04-27 MED FILL — ?AMLODIPINE BESYLATE 5 MG T: 5 MG | 30 days supply | Qty: 30 | Fill #2

## 2017-05-06 ENCOUNTER — Ambulatory Visit: Payer: Self-pay | Attending: Family Medicine

## 2017-05-11 ENCOUNTER — Ambulatory Visit: Payer: Self-pay | Attending: Family Medicine | Admitting: Family Medicine

## 2017-05-11 ENCOUNTER — Other Ambulatory Visit: Payer: Self-pay

## 2017-05-11 ENCOUNTER — Encounter: Payer: Self-pay | Admitting: Family Medicine

## 2017-05-11 VITALS — BP 121/89 | HR 94 | Temp 97.4°F | Resp 20 | Ht 70.0 in | Wt 305.0 lb

## 2017-05-11 DIAGNOSIS — Z79899 Other long term (current) drug therapy: Secondary | ICD-10-CM | POA: Insufficient documentation

## 2017-05-11 DIAGNOSIS — E782 Mixed hyperlipidemia: Secondary | ICD-10-CM | POA: Insufficient documentation

## 2017-05-11 DIAGNOSIS — J3489 Other specified disorders of nose and nasal sinuses: Secondary | ICD-10-CM | POA: Insufficient documentation

## 2017-05-11 DIAGNOSIS — I10 Essential (primary) hypertension: Secondary | ICD-10-CM | POA: Insufficient documentation

## 2017-05-11 DIAGNOSIS — J069 Acute upper respiratory infection, unspecified: Secondary | ICD-10-CM | POA: Insufficient documentation

## 2017-05-11 DIAGNOSIS — R0981 Nasal congestion: Secondary | ICD-10-CM

## 2017-05-11 DIAGNOSIS — R0989 Other specified symptoms and signs involving the circulatory and respiratory systems: Secondary | ICD-10-CM | POA: Insufficient documentation

## 2017-05-11 MED ORDER — ATORVASTATIN CALCIUM 20 MG PO TABS: 20.0000 mg | ORAL_TABLET | Freq: Every day | ORAL | 0 refills | Status: DC

## 2017-05-11 MED ORDER — GUAIFENESIN-DM 100-10 MG/5ML PO SYRP: 5.0000 mL | ORAL_SOLUTION | ORAL | 0 refills | Status: DC | PRN

## 2017-05-11 MED ORDER — OXYMETAZOLINE HCL 0.05 % NA SOLN: 1.0000 | Freq: Two times a day (BID) | NASAL | 0 refills | Status: DC | PRN

## 2017-05-11 MED FILL — LOSARTAN POTASSIUM 100 MG T: 100 | 30 days supply | Qty: 30 | Fill #6

## 2017-05-11 MED FILL — ?ATORVASTATIN 20MG TABLET: 20 | 30 days supply | Qty: 30 | Fill #0

## 2017-05-11 MED FILL — ROBAFEN-DM SYRUP: 100-10 | 11 days supply | Qty: 236 | Fill #0

## 2017-05-11 NOTE — Progress Notes (Signed)
Subjective:  Patient ID: Juan Kline, male    DOB: 1970-06-11  Age: 47 y.o. MRN: [id number]  CC: URI; Hypertension; and Medication Refill   HPI Jeanclaude Wentworth presents for hypertension follow up.    Hypertension   Disease Monitoring  Blood pressure range:  He does not check BP at home. He reports vaping.   Chest pain: no   Dyspnea: no   Claudication: no   Medication compliance: yes  Medication Side Effects  Lightheadedness: no   Urinary frequency: no   Edema: no   Impotence: no   Preventitive Healthcare:  Exercise: no   Diet Pattern: regular   Salt Restriction: no  URI symptoms Onset last Sunday. Symptoms include sore throat, rhinorrhea, ear stuffiness, and cough. He reports sore throat resolved two days after symptoms onset, other symptoms remain. He denies any fevers, chills, or sick contacts. He reports taking OTC cold medication for symptoms with some relief of symptoms.    Outpatient Medications Prior to Visit  Medication Sig Dispense Refill  . amLODipine (NORVASC) 5 MG tablet Take 1 tablet (5 mg total) by mouth daily. 90 tablet 1  . Cholecalciferol (VITAMIN D-3 PO) Take 1 capsule by mouth daily.    . hydrochlorothiazide (HYDRODIURIL) 12.5 MG tablet Take 1 tablet (12.5 mg total) by mouth daily. 90 tablet 3  . losartan (COZAAR) 100 MG tablet Take 1 tablet (100 mg total) by mouth daily. 90 tablet 1  . atorvastatin (LIPITOR) 20 MG tablet Take 1 tablet (20 mg total) by mouth daily. (Patient not taking: Reported on 05/11/2017) 90 tablet 1   No facility-administered medications prior to visit.     ROS Review of Systems  Constitutional: Negative.   HENT: Positive for congestion, rhinorrhea and sore throat.   Respiratory: Positive for cough.   Cardiovascular: Negative.   Skin: Negative.         Objective:  BP 121/89 (BP Location: Left Arm, Cuff Size: Large)   Pulse 94   Temp (!) 97.4 F (36.3 C) (Oral)   Resp 20   Ht 5\' 10"  (1.778 m)   Wt (!) 305 lb (138.3  kg)   SpO2 94%   BMI 43.76 kg/m   BP/Weight 05/11/2017 02/10/2017 46/96/2952  Systolic BP 841 324 401  Diastolic BP 89 82 92  Wt. (Lbs) 305 297.4 294  BMI 43.76 42.67 42.18     Physical Exam  Constitutional: He appears well-developed and well-nourished.  HENT:  Head: Normocephalic and atraumatic.  Right Ear: External ear normal.  Left Ear: External ear normal.  Nose: Mucosal edema and rhinorrhea present.  Mouth/Throat: Oropharynx is clear and moist.  Eyes: Conjunctivae are normal. Right eye exhibits no discharge. Left eye exhibits no discharge.  Neck: Normal range of motion. Neck supple. No JVD present.  Cardiovascular: Normal rate, regular rhythm, normal heart sounds and intact distal pulses.  Pulmonary/Chest: Effort normal. He has rhonchi.  Abdominal: Soft. Bowel sounds are normal.  Lymphadenopathy:    He has no cervical adenopathy.  Skin: Skin is warm and dry.  Psychiatric: He has a normal mood and affect.  Nursing note and vitals reviewed.    Assessment & Plan:   1. URI with cough and congestion  - guaiFENesin-dextromethorphan (ROBITUSSIN DM) 100-10 MG/5ML syrup; Take 5 mLs by mouth every 4 (four) hours as needed for cough.  Dispense: 236 mL; Refill: 0 - Respiratory virus panel  2. Rhonchi  - guaiFENesin-dextromethorphan (ROBITUSSIN DM) 100-10 MG/5ML syrup; Take 5 mLs by mouth every 4 (four)  hours as needed for cough.  Dispense: 236 mL; Refill: 0  3. Rhinorrhea  - oxymetazoline (AFRIN 12 HOUR) 0.05 % nasal spray; Place 1 spray into both nostrils 2 (two) times daily as needed for congestion. For 3 days.  Dispense: 30 mL; Refill: 0  4. Nasal congestion  - oxymetazoline (AFRIN 12 HOUR) 0.05 % nasal spray; Place 1 spray into both nostrils 2 (two) times daily as needed for congestion. For 3 days.  Dispense: 30 mL; Refill: 0  5. Essential hypertension   6. Mixed hyperlipidemia  - atorvastatin (LIPITOR) 20 MG tablet; Take 1 tablet (20 mg total) by mouth daily.   Dispense: 90 tablet; Refill: 0      Follow-up: Return in about 3 months (around 08/09/2017), or if symptoms worsen or fail to improve, for HTN/HLD.   Alfonse Spruce FNP

## 2017-05-11 NOTE — Progress Notes (Signed)
Cough/ congestion x 1 week Sleep study

## 2017-05-11 NOTE — Patient Instructions (Addendum)
Upper Respiratory Infection, Adult Most upper respiratory infections (URIs) are caused by a virus. A URI affects the nose, throat, and upper air passages. The most common type of URI is often called "the common cold." Follow these instructions at home:  Take medicines only as told by your doctor.  Gargle warm saltwater or take cough drops to comfort your throat as told by your doctor.  Use a warm mist humidifier or inhale steam from a shower to increase air moisture. This may make it easier to breathe.  Drink enough fluid to keep your pee (urine) clear or pale yellow.  Eat soups and other clear broths.  Have a healthy diet.  Rest as needed.  Go back to work when your fever is gone or your doctor says it is okay. ? You may need to stay home longer to avoid giving your URI to others. ? You can also wear a face mask and wash your hands often to prevent spread of the virus.  Use your inhaler more if you have asthma.  Do not use any tobacco products, including cigarettes, chewing tobacco, or electronic cigarettes. If you need help quitting, ask your doctor. Contact a doctor if:  You are getting worse, not better.  Your symptoms are not helped by medicine.  You have chills.  You are getting more short of breath.  You have brown or red mucus.  You have yellow or brown discharge from your nose.  You have pain in your face, especially when you bend forward.  You have a fever.  You have puffy (swollen) neck glands.  You have pain while swallowing.  You have white areas in the back of your throat. Get help right away if:  You have very bad or constant: ? Headache. ? Ear pain. ? Pain in your forehead, behind your eyes, and over your cheekbones (sinus pain). ? Chest pain.  You have long-lasting (chronic) lung disease and any of the following: ? Wheezing. ? Long-lasting cough. ? Coughing up blood. ? A change in your usual mucus.  You have a stiff neck.  You have  changes in your: ? Vision. ? Hearing. ? Thinking. ? Mood. This information is not intended to replace advice given to you by your health care provider. Make sure you discuss any questions you have with your health care provider. Document Released: 09/17/2007 Document Revised: 12/02/2015 Document Reviewed: 07/06/2013 Elsevier Interactive Patient Education  2018 Elsevier Inc.  

## 2017-05-13 LAB — RESPIRATORY VIRUS PANEL
ADENOVIRUS: NEGATIVE
Influenza A: NEGATIVE
Influenza B: NEGATIVE
Metapneumovirus: NEGATIVE
Parainfluenza 1: NEGATIVE
Parainfluenza 2: NEGATIVE
Parainfluenza 3: NEGATIVE
RESPIRATORY SYNCYTIAL VIRUS A: NEGATIVE
RESPIRATORY SYNCYTIAL VIRUS B: NEGATIVE
RHINOVIRUS: NEGATIVE

## 2017-05-15 ENCOUNTER — Telehealth: Payer: Self-pay

## 2017-05-15 NOTE — Telephone Encounter (Signed)
Contacted pt to go over lab results pt is aware and doesn't have any questions or concerns 

## 2017-05-26 MED FILL — ?HYDROCHLOROTHIAZIDE 12.5MG: 12.5 | 30 days supply | Qty: 30 | Fill #4

## 2017-05-26 MED FILL — ?AMLODIPINE BESYLATE 5 MG T: 5 MG | 30 days supply | Qty: 30 | Fill #3

## 2017-06-15 MED FILL — LOSARTAN POTASSIUM 100 MG T: 100 | 30 days supply | Qty: 30 | Fill #7

## 2017-06-24 MED FILL — HYDROCHLOROTHIAZIDE 12.5 MG: 12.5 | 30 days supply | Qty: 30 | Fill #5

## 2017-07-27 MED FILL — LOSARTAN POTASSIUM 100 MG T: 100 | 30 days supply | Qty: 30 | Fill #8

## 2017-07-27 MED FILL — HYDROCHLOROTHIAZIDE 12.5 MG: 12.5 | 30 days supply | Qty: 30 | Fill #6

## 2017-08-05 ENCOUNTER — Emergency Department (HOSPITAL_COMMUNITY)
Admission: EM | Admit: 2017-08-05 | Discharge: 2017-08-05 | Disposition: A | Discharge status: Home / Self Care | Payer: Self-pay | Attending: Emergency Medicine | Admitting: Emergency Medicine

## 2017-08-05 ENCOUNTER — Encounter (HOSPITAL_COMMUNITY): Payer: Self-pay | Admitting: *Deleted

## 2017-08-05 ENCOUNTER — Other Ambulatory Visit: Payer: Self-pay

## 2017-08-05 ENCOUNTER — Emergency Department (HOSPITAL_COMMUNITY): Payer: Self-pay

## 2017-08-05 DIAGNOSIS — R0602 Shortness of breath: Secondary | ICD-10-CM | POA: Insufficient documentation

## 2017-08-05 DIAGNOSIS — I159 Secondary hypertension, unspecified: Secondary | ICD-10-CM

## 2017-08-05 DIAGNOSIS — I10 Essential (primary) hypertension: Secondary | ICD-10-CM | POA: Insufficient documentation

## 2017-08-05 DIAGNOSIS — Z87891 Personal history of nicotine dependence: Secondary | ICD-10-CM | POA: Insufficient documentation

## 2017-08-05 LAB — COMPREHENSIVE METABOLIC PANEL
ALT: 33 U/L (ref 17–63)
AST: 36 U/L (ref 15–41)
Albumin: 3.3 g/dL — ABNORMAL LOW (ref 3.5–5.0)
Alkaline Phosphatase: 39 U/L (ref 38–126)
Anion gap: 14 (ref 5–15)
BILIRUBIN TOTAL: 0.4 mg/dL (ref 0.3–1.2)
BUN: 16 mg/dL (ref 6–20)
CO2: 23 mmol/L (ref 22–32)
CREATININE: 0.99 mg/dL (ref 0.61–1.24)
Calcium: 9.1 mg/dL (ref 8.9–10.3)
Chloride: 100 mmol/L — ABNORMAL LOW (ref 101–111)
GFR calc Af Amer: >60 mL/min (ref >60)
GLUCOSE: 138 mg/dL — AB (ref 65–99)
POTASSIUM: 4.1 mmol/L (ref 3.5–5.1)
Sodium: 137 mmol/L (ref 135–145)
Total Protein: 6.6 g/dL (ref 6.5–8.1)

## 2017-08-05 LAB — CBC WITH DIFFERENTIAL/PLATELET
BASOS ABS: 0 10*3/uL (ref 0.0–0.1)
Basophils Relative: 0 %
Eosinophils Absolute: 0.1 10*3/uL (ref 0.0–0.7)
Eosinophils Relative: 1 %
HEMATOCRIT: 45.3 % (ref 39.0–52.0)
HEMOGLOBIN: 15.4 g/dL (ref 13.0–17.0)
LYMPHS PCT: 21 %
Lymphs Abs: 1.8 10*3/uL (ref 0.7–4.0)
MCH: 31.6 pg (ref 26.0–34.0)
MCHC: 34 g/dL (ref 30.0–36.0)
MCV: 93 fL (ref 78.0–100.0)
MONO ABS: 0.6 10*3/uL (ref 0.1–1.0)
MONOS PCT: 7 %
NEUTROS ABS: 6.3 10*3/uL (ref 1.7–7.7)
NEUTROS PCT: 71 %
Platelets: 231 10*3/uL (ref 150–400)
RBC: 4.87 MIL/uL (ref 4.22–5.81)
RDW: 13.7 % (ref 11.5–15.5)
WBC: 8.8 10*3/uL (ref 4.0–10.5)

## 2017-08-05 LAB — BRAIN NATRIURETIC PEPTIDE: B Natriuretic Peptide: 38.3 pg/mL (ref 0.0–100.0)

## 2017-08-05 LAB — TROPONIN I

## 2017-08-05 NOTE — ED Provider Notes (Signed)
Emergency Department Provider Note   I have reviewed the triage vital signs and the nursing notes.   HISTORY  Chief Complaint Hypertension   HPI Juan Kline is a 47 y.o. male multiple medical problems as documented below the presents to the emergency department today with shortness of breath.  Patient states he has a history of having high blood pressure when when he has that he has tightness in his chest and has had x-rays have some shortness of breath.  This happened today and proximal 30 minutes later today getting better so he called EMS for evaluation.  Apparently they told him they thought he had atrial fibrillation however on review of the EKGs there is 2 EKGs with sinus tachycardia.  Apparently had slightly low oxygen slipped on oxygen and brought him here for further evaluation.  Patient states he is astigmatic at this time.  He has limited tightness in his head but no headache, blurry vision, vision changes, numbness or weakness anywhere. No other associated or modifying symptoms.    Past Medical History:  Diagnosis Date  . Chronic foot ulcer ([facility]) admitted 03/02/2014   medial border first metatarsal left foot  . Hypertension Dx 2009  . Osteomyelitis left first metatarsal 03/02/2014  . Venous stasis ulcer of left lower extremity ([place])    [name] 03/02/2014    Patient Active Problem List   Diagnosis Date Noted  . Chest pain 12/12/2016  . Peripheral neuropathy 12/12/2016  . Other fatigue 09/26/2016  . Dizzy 05/11/2015  . Callus of foot 05/11/2015  . Anxiety state 05/11/2015  . Former smoker 05/11/2015  . Prediabetes 11/27/2014  . Muscle spasm 08/21/2014  . Sneezing 06/28/2014  . Other hyperlipidemia 05/22/2014  . Vitamin D insufficiency 05/22/2014  . Temporal pain 05/19/2014  . Numbness and tingling of foot 05/19/2014  . Venous stasis dermatitis of both lower extremities 05/19/2014  . Uncontrolled hypertension 03/08/2014  . history of Hypertension 03/02/2014  .  Morbid obesity ([place]) 03/02/2014    Past Surgical History:  Procedure Laterality Date  . NO PAST SURGERIES      Current Outpatient Rx  . Order #: [id number] Class: Historical Med  . Order #: [id number] Class: Normal  . Order #: [id number] Class: Normal    Allergies Lyrica [pregabalin]  Family History  Problem Relation Age of Onset  . Hypertension Mother   . Hyperlipidemia Mother   . Diabetes Mother   . Kidney disease Mother   . Hypertension Father   . Hyperlipidemia Father   . Diabetes Brother   . Heart disease Maternal Grandmother   . Diabetes Maternal Grandfather   . Heart attack Paternal Grandmother   . Heart disease Paternal Grandfather     Social History Social History   Tobacco Use  . Smoking status: Former Smoker    Packs/day: 0.10    Years: 26.00    Pack years: 2.60    Types: Cigarettes    Last attempt to quit: 02/16/2014    Years since quitting: 3.4  . Smokeless tobacco: Never Used  Substance Use Topics  . Alcohol use: Yes    Comment: 03/02/2014 "I rarely drink"  . Drug use: No    Review of Systems  All other systems negative except as documented in the HPI. All pertinent positives and negatives as reviewed in the HPI. ____________________________________________   PHYSICAL EXAM:  VITAL SIGNS: ED Triage Vitals  Enc Vitals Group     BP 08/05/17 1451 128/89     Pulse Rate 08/05/17 1451 87  Resp 08/05/17 1451 18     Temp 08/05/17 1451 98.6 F (37 C)     Temp Source 08/05/17 1451 Oral     SpO2 08/05/17 1451 94 %     Weight 08/05/17 1449 (!) 305 lb (138.3 kg)     Height 08/05/17 1449 5\' 9"  (1.753 m)    Constitutional: Alert and oriented. Well appearing and in no acute distress. Eyes: Conjunctivae are normal. PERRL. EOMI. Head: Atraumatic. Nose: No congestion/rhinnorhea. Mouth/Throat: Mucous membranes are moist.  Oropharynx non-erythematous. Neck: No stridor.  No meningeal signs.   Cardiovascular: Normal rate, regular rhythm. Good peripheral  circulation. Grossly normal heart sounds.   Respiratory: Normal respiratory effort.  No retractions. Lungs CTAB. Gastrointestinal: Soft and nontender. No distention.  Musculoskeletal: No lower extremity tenderness nor edema. No gross deformities of extremities. Neurologic:  Normal speech and language. No gross focal neurologic deficits are appreciated.  Skin:  Skin is warm, dry and intact. No rash noted.  ____________________________________________   LABS (all labs ordered are listed, but only abnormal results are displayed)  Labs Reviewed  COMPREHENSIVE METABOLIC PANEL - Abnormal; Notable for the following components:      Result Value   Chloride 100 (*)    Glucose, Bld 138 (*)    Albumin 3.3 (*)    All other components within normal limits  CBC WITH DIFFERENTIAL/PLATELET  TROPONIN I  BRAIN NATRIURETIC PEPTIDE   ____________________________________________  EKG   EKG Interpretation  Date/Time:  Wednesday August 05 2017 14:22:34 EDT Ventricular Rate:  92 PR Interval:    QRS Duration: 102 QT Interval:  356 QTC Calculation: 441 R Axis:   -84 Text Interpretation:  Sinus rhythm LAD, consider left anterior fascicular block Low voltage, extremity leads Abnormal R-wave progression, late transition No significant change since last tracing Confirmed by Merrily Pew [phone]) on 08/05/2017 3:10:54 PM       ____________________________________________  RADIOLOGY  Dg Chest 2 View  Result Date: 08/05/2017 CLINICAL DATA:  Shortness of Breath EXAM: CHEST - 2 VIEW COMPARISON:  11/19/2016 FINDINGS: Cardiac shadow is within normal limits. The lungs are well aerated bilaterally. No focal infiltrate or sizable effusion is seen. No acute bony abnormality is noted. IMPRESSION: No active cardiopulmonary disease. Electronically Signed   By: Inez Catalina M.D.   On: 08/05/2017 15:55    ____________________________________________   PROCEDURES  Procedure(s) performed:    Procedures   ____________________________________________   INITIAL IMPRESSION / ASSESSMENT AND PLAN / ED COURSE  No e/o afib here. No e/o end organ damage on exam.   NO e/o end organ damage. Multiple normal BP's. No change in meds. Still asymptomatic.    Pertinent labs & imaging results that were available during my care of the patient were reviewed by me and considered in my medical decision making (see chart for details).  ____________________________________________  FINAL CLINICAL IMPRESSION(S) / ED DIAGNOSES  Final diagnoses:  Secondary hypertension     MEDICATIONS GIVEN DURING THIS VISIT:  Medications - No data to display   NEW OUTPATIENT MEDICATIONS STARTED DURING THIS VISIT:  Current Discharge Medication List      Note:  This note was prepared with assistance of Dragon voice recognition software. Occasional wrong-word or sound-a-like substitutions may have occurred due to the inherent limitations of voice recognition software.   Merrily Pew, MD 08/05/17 [phone]

## 2017-08-05 NOTE — ED Triage Notes (Signed)
Pt brought in via [facility]; Pt was at home watching TV ; started having SOB, checked BP at home, read high; 778 systolic with [facility]; 12 showed a-fib (no diagnosed Hx of a-fib); denies cp, dizziness, HA; pt takes losartan and hydrochlorithaizide  163/115, HR 104, RR 20, CBG 172, O2 RA 90 -91%, pt placed on 2 L with [facility], came up to 96-97%

## 2017-08-05 NOTE — ED Notes (Signed)
Patient transported to X-ray 

## 2017-08-13 ENCOUNTER — Other Ambulatory Visit: Payer: Self-pay

## 2017-08-13 ENCOUNTER — Ambulatory Visit: Payer: Self-pay | Attending: Family Medicine | Admitting: Physician Assistant

## 2017-08-13 VITALS — BP 138/90 | HR 91 | Temp 98.1°F | Resp 18 | Ht 69.0 in | Wt 303.6 lb

## 2017-08-13 DIAGNOSIS — Z79899 Other long term (current) drug therapy: Secondary | ICD-10-CM | POA: Insufficient documentation

## 2017-08-13 DIAGNOSIS — G4710 Hypersomnia, unspecified: Secondary | ICD-10-CM

## 2017-08-13 DIAGNOSIS — Z09 Encounter for follow-up examination after completed treatment for conditions other than malignant neoplasm: Secondary | ICD-10-CM | POA: Insufficient documentation

## 2017-08-13 DIAGNOSIS — E785 Hyperlipidemia, unspecified: Secondary | ICD-10-CM | POA: Insufficient documentation

## 2017-08-13 DIAGNOSIS — R6889 Other general symptoms and signs: Secondary | ICD-10-CM

## 2017-08-13 DIAGNOSIS — I10 Essential (primary) hypertension: Secondary | ICD-10-CM | POA: Insufficient documentation

## 2017-08-13 DIAGNOSIS — R5383 Other fatigue: Secondary | ICD-10-CM

## 2017-08-13 DIAGNOSIS — R400 Somnolence: Secondary | ICD-10-CM | POA: Insufficient documentation

## 2017-08-13 MED ORDER — AMLODIPINE BESYLATE 5 MG PO TABS
5.0000 mg | ORAL_TABLET | Freq: Every day | ORAL | 0 refills | Status: DC
Start: 2017-08-13 — End: 2017-09-15

## 2017-08-13 MED ORDER — LOSARTAN POTASSIUM 100 MG PO TABS: 100.0000 mg | ORAL_TABLET | Freq: Every day | ORAL | 0 refills | Status: DC

## 2017-08-13 MED ORDER — HYDROCHLOROTHIAZIDE 12.5 MG PO TABS: 12.5000 mg | ORAL_TABLET | Freq: Every day | ORAL | 0 refills | Status: DC

## 2017-08-13 MED FILL — AMLODIPINE BESYLATE 5 MG TA: 5 | 30 days supply | Qty: 30 | Fill #0

## 2017-08-13 NOTE — Progress Notes (Signed)
Chief Complaint: ED follow up  Subjective: 47 year old male with known hypertension presenting for follow-up of a recent emergency department visit on 08/06/2017.He states for months now he's been feeling fatigue, weak, lethargic, having difficulties regulating his body temperature, swimmy headed and short of breath. About 2 months ago he took himself off of his third antihypertensive, Norvasc. He has been taking the hydrochlorothiazide and losartan. He checks his blood pressure periodically at home.On this particular day of the emergency department visit he was feeling swimmy headed. He also was feeling short of breath. 911 was contacted. He was told that his systolic blood pressure was greater than 190. This improved prior to his emergency department visit. At the time of discharge from the hospital his blood pressure was 128/89. Labs including troponin, CBC, peptide, CMP were within normal limits. Chest x-ray showed no evidence of pulmonary edema. EKG initially showed sinus tachycardia.  He still has the above symptoms. He is wondering if he has thyroid trouble. He further states that he has difficulty with hypersomnolence. He snores loudly. He knows that he is overweight.   ROS:  GEN: denies fever or chills, denies change in weight Skin: denies lesions or rashes HEENT: denies headache, earache, epistaxis, sore throat, or neck pain LUNGS: denies SHOB, dyspnea, PND, orthopnea CV: denies CP or palpitations ABD: denies abd pain, N or V EXT: denies muscle spasms or swelling; no pain in lower ext, no weakness NEURO: denies numbness or tingling, denies sz, stroke or TIA   Objective:  Vitals:   08/13/17 1407  BP: 138/90  Pulse: 91  Resp: 18  Temp: 98.1 F (36.7 C)  TempSrc: Oral  SpO2: 92%  Weight: (!) 303 lb 9.6 oz (137.7 kg)  Height: 5\' 9"  (1.753 m)    Physical Exam:  General: in no acute distress. HEENT: no pallor, no icterus, moist oral mucosa, no JVD, no lymphadenopathy Heart:  Normal  s1 &s2  Regular rate and rhythm, without murmurs, rubs, gallops. Lungs: Clear to auscultation bilaterally. Abdomen: Soft, nontender, nondistended, positive bowel sounds. Extremities: No clubbing cyanosis or edema with positive pedal pulses. Neuro: Alert, awake, oriented x3, nonfocal.  Pertinent Lab Results:none   Medications: Prior to Admission medications   Medication Sig Start Date End Date Taking? Authorizing Provider  Cholecalciferol (VITAMIN D-3 PO) Take 1 capsule by mouth daily.   Yes [provider]  hydrochlorothiazide (HYDRODIURIL) 12.5 MG tablet Take 1 tablet (12.5 mg total) by mouth daily. 08/13/17  Yes Ena Dawley, Jaydence Arnesen S, PA-C  amLODipine (NORVASC) 5 MG tablet Take 1 tablet (5 mg total) by mouth daily. 08/13/17   Brayton Caves, PA-C  losartan (COZAAR) 100 MG tablet Take 1 tablet (100 mg total) by mouth daily. 08/13/17   Brayton Caves, PA-C    Assessment: 1. HTN 2. Hypersomnolence/Fatigue/Morbid Obesity 3. Dyslipidemia  Plan: Add Norvasc back Cont HCTZ and Losartan Low salt diet Sleep study referral TSH  Follow up:2-4 weeks  The patient was given clear instructions to go to ER or return to medical center if symptoms don't improve, worsen or new problems develop. The patient verbalized understanding. The patient was told to call to get lab results if they haven't heard anything in the next week.   This note has been created with Surveyor, quantity. Any transcriptional errors are unintentional.   Zettie Pho, PA-C 08/13/2017, 2:29 PM

## 2017-08-14 LAB — TSH: TSH: 4.17 u[IU]/mL (ref 0.450–4.500)

## 2017-08-17 NOTE — Addendum Note (Signed)
Addended byZettie Pho S on: 08/17/2017 11:45 AM   Modules accepted: Orders

## 2017-08-24 MED FILL — ?HYDROCHLOROTHIAZIDE 12.5 M: 12.5 | 30 days supply | Qty: 30 | Fill #7

## 2017-08-24 MED FILL — LOSARTAN POTASSIUM 100 MG T: 100 | 30 days supply | Qty: 30 | Fill #9

## 2017-09-08 MED FILL — AMLODIPINE BESYLATE 5 MG TA: 5 | 30 days supply | Qty: 30 | Fill #1

## 2017-09-10 ENCOUNTER — Ambulatory Visit (HOSPITAL_BASED_OUTPATIENT_CLINIC_OR_DEPARTMENT_OTHER): Payer: Self-pay | Attending: Physician Assistant | Admitting: Internal Medicine

## 2017-09-10 VITALS — Ht 69.0 in | Wt 300.0 lb

## 2017-09-10 DIAGNOSIS — G4733 Obstructive sleep apnea (adult) (pediatric): Secondary | ICD-10-CM | POA: Insufficient documentation

## 2017-09-10 DIAGNOSIS — E669 Obesity, unspecified: Secondary | ICD-10-CM | POA: Insufficient documentation

## 2017-09-10 DIAGNOSIS — I10 Essential (primary) hypertension: Secondary | ICD-10-CM | POA: Insufficient documentation

## 2017-09-10 DIAGNOSIS — Z6841 Body Mass Index (BMI) 40.0 and over, adult: Secondary | ICD-10-CM | POA: Insufficient documentation

## 2017-09-10 DIAGNOSIS — G4710 Hypersomnia, unspecified: Secondary | ICD-10-CM

## 2017-09-10 DIAGNOSIS — R5383 Other fatigue: Secondary | ICD-10-CM | POA: Insufficient documentation

## 2017-09-10 DIAGNOSIS — R0683 Snoring: Secondary | ICD-10-CM | POA: Insufficient documentation

## 2017-09-10 DIAGNOSIS — G4736 Sleep related hypoventilation in conditions classified elsewhere: Secondary | ICD-10-CM | POA: Insufficient documentation

## 2017-09-15 ENCOUNTER — Telehealth: Payer: Self-pay | Admitting: Internal Medicine

## 2017-09-15 ENCOUNTER — Encounter: Payer: Self-pay | Admitting: Internal Medicine

## 2017-09-15 ENCOUNTER — Ambulatory Visit: Payer: Self-pay | Attending: Internal Medicine | Admitting: Internal Medicine

## 2017-09-15 VITALS — BP 129/89 | HR 79 | Temp 97.6°F | Resp 16 | Wt 308.2 lb

## 2017-09-15 DIAGNOSIS — E7849 Other hyperlipidemia: Secondary | ICD-10-CM | POA: Insufficient documentation

## 2017-09-15 DIAGNOSIS — Z6841 Body Mass Index (BMI) 40.0 and over, adult: Secondary | ICD-10-CM | POA: Insufficient documentation

## 2017-09-15 DIAGNOSIS — Z7689 Persons encountering health services in other specified circumstances: Secondary | ICD-10-CM | POA: Insufficient documentation

## 2017-09-15 DIAGNOSIS — G629 Polyneuropathy, unspecified: Secondary | ICD-10-CM | POA: Insufficient documentation

## 2017-09-15 DIAGNOSIS — E559 Vitamin D deficiency, unspecified: Secondary | ICD-10-CM | POA: Insufficient documentation

## 2017-09-15 DIAGNOSIS — I10 Essential (primary) hypertension: Secondary | ICD-10-CM | POA: Insufficient documentation

## 2017-09-15 DIAGNOSIS — Z79899 Other long term (current) drug therapy: Secondary | ICD-10-CM | POA: Insufficient documentation

## 2017-09-15 DIAGNOSIS — F411 Generalized anxiety disorder: Secondary | ICD-10-CM | POA: Insufficient documentation

## 2017-09-15 DIAGNOSIS — F419 Anxiety disorder, unspecified: Secondary | ICD-10-CM

## 2017-09-15 DIAGNOSIS — Z87891 Personal history of nicotine dependence: Secondary | ICD-10-CM | POA: Insufficient documentation

## 2017-09-15 DIAGNOSIS — R7303 Prediabetes: Secondary | ICD-10-CM | POA: Insufficient documentation

## 2017-09-15 DIAGNOSIS — H9313 Tinnitus, bilateral: Secondary | ICD-10-CM | POA: Insufficient documentation

## 2017-09-15 DIAGNOSIS — E785 Hyperlipidemia, unspecified: Secondary | ICD-10-CM | POA: Insufficient documentation

## 2017-09-15 DIAGNOSIS — R5382 Chronic fatigue, unspecified: Secondary | ICD-10-CM | POA: Insufficient documentation

## 2017-09-15 MED ORDER — AMLODIPINE BESYLATE 10 MG PO TABS: 10.0000 mg | ORAL_TABLET | Freq: Every day | ORAL | 6 refills | Status: DC

## 2017-09-15 MED ORDER — SERTRALINE HCL 50 MG PO TABS: ORAL_TABLET | ORAL | 1 refills | Status: DC

## 2017-09-15 MED FILL — SERTRALINE HCL 50 MG TABS: 50 | 37 days supply | Qty: 30 | Fill #0

## 2017-09-15 NOTE — Patient Instructions (Signed)
Start Zoloft as discussed. Try to get out and walk for 10 minutes daily.

## 2017-09-15 NOTE — Telephone Encounter (Signed)
Pt is aware.  

## 2017-09-15 NOTE — Progress Notes (Signed)
Patient ID: Juan Kline, male    DOB: 12-13-1970  MRN: [id number]  CC: re-establish and Hypertension   Subjective: Juan Kline is a 47 y.o. male who presents for chronic ds management and to est with me as PCP. Mom is with him.  His concerns today include:  HTN, HL.   Patient used to be followed by Dr. Adrian Blackwater who has left the practice.  Seen 1 month ago by PA post ER visit complaining of fatigue, difficulty regulating his body temperature, swimming in the head and shortness of breath.  Blood pressure was found to be elevated but otherwise work-up in the ER was negative.  Patient was referred for sleep study due to symptoms of hypersomnolence and fatigue.  Sleep study done last week but results are not available to [name] yet.  Norvasc was also restarted.  Today he brings in a log of his blood pressure readings.  He has an arm cuff device at home.  Recent readings reveal systolic blood pressure between 1 17-144 but most have been in the 120s.  Diastolic blood pressure has been 82 to 90s.  He has a written log of symptoms including alternation of body temperature from hot to cold.  Soles where he feels short of breath, little chest discomfort, tightness in the throat and tingling in the arms.  He reports that these symptoms have sent him to the emergency room several times in the past 1 year especially when he they will wake him from sleep.  He has persistent high-pitched ringing in the ears.  Denies previous history of working in loud environments.  Denies any hearing loss.  Initially he denied any history of anxiety but then later reports that he was on Lexapro for anxiety.  Saw cardiology last year for these symptoms and had normal echo and exercise stress test.  Continues to endorse fatigue even though he gets in 6 to 8 hours of sleep at nights.  Most morning he wakes up feeling refreshed.  Any amount of minimal work causes him to feel fatigue.  He has not worked in several years.  He used to do  manual labor.  He sits at home watching TV and playing games all day.  Denies any substance abuse.  Denies any joint aches or muscle pains.  TSH checked on last visit was normal.    Patient Active Problem List   Diagnosis Date Noted  . Chest pain 12/12/2016  . Peripheral neuropathy 12/12/2016  . Other fatigue 09/26/2016  . Dizzy 05/11/2015  . Callus of foot 05/11/2015  . Anxiety state 05/11/2015  . Former smoker 05/11/2015  . Prediabetes 11/27/2014  . Muscle spasm 08/21/2014  . Sneezing 06/28/2014  . Other hyperlipidemia 05/22/2014  . Vitamin D insufficiency 05/22/2014  . Temporal pain 05/19/2014  . Numbness and tingling of foot 05/19/2014  . Venous stasis dermatitis of both lower extremities 05/19/2014  . Uncontrolled hypertension 03/08/2014  . history of Hypertension 03/02/2014  . Morbid obesity ([place]) 03/02/2014     Current Outpatient Medications on File Prior to Visit  Medication Sig Dispense Refill  . amLODipine (NORVASC) 5 MG tablet Take 1 tablet (5 mg total) by mouth daily. 90 tablet 0  . Cholecalciferol (VITAMIN D-3 PO) Take 1 capsule by mouth daily.    . hydrochlorothiazide (HYDRODIURIL) 12.5 MG tablet Take 1 tablet (12.5 mg total) by mouth daily. 90 tablet 0  . losartan (COZAAR) 100 MG tablet Take 1 tablet (100 mg total) by mouth daily. 90 tablet  0   No current facility-administered medications on file prior to visit.     Allergies  Allergen Reactions  . Lyrica [Pregabalin] Anxiety    Social History   Socioeconomic History  . Marital status: Single    Spouse name: Not on file  . Number of children: Not on file  . Years of education: Not on file  . Highest education level: Not on file  Occupational History  . Not on file  Social Needs  . Financial resource strain: Not on file  . Food insecurity:    Worry: Not on file    Inability: Not on file  . Transportation needs:    Medical: Not on file    Non-medical: Not on file  Tobacco Use  . Smoking status:  Former Smoker    Packs/day: 0.10    Years: 26.00    Pack years: 2.60    Types: Cigarettes    Last attempt to quit: 02/16/2014    Years since quitting: 3.5  . Smokeless tobacco: Never Used  Substance and Sexual Activity  . Alcohol use: Yes    Comment: 03/02/2014 "I rarely drink"  . Drug use: No  . Sexual activity: Never  Lifestyle  . Physical activity:    Days per week: Not on file    Minutes per session: Not on file  . Stress: Not on file  Relationships  . Social connections:    Talks on phone: Not on file    Gets together: Not on file    Attends religious service: Not on file    Active member of club or organization: Not on file    Attends meetings of clubs or organizations: Not on file    Relationship status: Not on file  . Intimate partner violence:    Fear of current or ex partner: Not on file    Emotionally abused: Not on file    Physically abused: Not on file    Forced sexual activity: Not on file  Other Topics Concern  . Not on file  Social History Narrative  . Not on file    Family History  Problem Relation Age of Onset  . Hypertension Mother   . Hyperlipidemia Mother   . Diabetes Mother   . Kidney disease Mother   . Hypertension Father   . Hyperlipidemia Father   . Diabetes Brother   . Heart disease Maternal Grandmother   . Diabetes Maternal Grandfather   . Heart attack Paternal Grandmother   . Heart disease Paternal Grandfather     Past Surgical History:  Procedure Laterality Date  . NO PAST SURGERIES      ROS: Review of Systems Negative except as stated above PHYSICAL EXAM: BP 129/89   Pulse 79   Temp 97.6 F (36.4 C) (Oral)   Resp 16   Wt (!) 308 lb 3.2 oz (139.8 kg)   SpO2 96%   BMI 45.51 kg/m   Wt Readings from Last 3 Encounters:  09/15/17 (!) 308 lb 3.2 oz (139.8 kg)  09/10/17 300 lb (136.1 kg)  08/13/17 (!) 303 lb 9.6 oz (137.7 kg)    Physical Exam  General appearance - alert, well appearing, and in no distress Mental  status - normal mood, behavior, speech, dress, motor activity, and thought processes Neck - supple, no significant adenopathy Chest - clear to auscultation, no wheezes, rales or rhonchi, symmetric air entry Heart - normal rate, regular rhythm, normal S1, S2, no murmurs, rubs, clicks or gallops Abdomen -obese,  normal bowel sounds.  Grapefruit sized periumbilical hernia Extremities -no lower extremity edema.  Some hyperpigmentation in the lower third of the legs with varicosities.  Lab Results  Component Value Date   TSH 4.170 08/13/2017    Lab Results  Component Value Date   WBC 8.8 08/05/2017   HGB 15.4 08/05/2017   HCT 45.3 08/05/2017   MCV 93.0 08/05/2017   PLT 231 08/05/2017     Chemistry      Component Value Date/Time   NA 137 08/05/2017 1543   NA 140 10/28/2016 0851   K 4.1 08/05/2017 1543   CL 100 (L) 08/05/2017 1543   CO2 23 08/05/2017 1543   BUN 16 08/05/2017 1543   BUN 25 (H) 10/28/2016 0851   CREATININE 0.99 08/05/2017 1543   CREATININE 0.70 02/28/2016 1244      Component Value Date/Time   CALCIUM 9.1 08/05/2017 1543   ALKPHOS 39 08/05/2017 1543   AST 36 08/05/2017 1543   ALT 33 08/05/2017 1543   BILITOT 0.4 08/05/2017 1543   BILITOT 0.4 09/26/2016 1701     Depression screen PHQ 2/9 09/15/2017 08/13/2017 08/13/2017  Decreased Interest 1 3 0  Down, Depressed, Hopeless 1 1 0  PHQ - 2 Score 2 4 0  Altered sleeping 1 3 -  Tired, decreased energy 1 3 -  Change in appetite 1 2 -  Feeling bad or failure about yourself  0 0 -  Trouble concentrating 1 2 -  Moving slowly or fidgety/restless 0 0 -  Suicidal thoughts 0 0 -  PHQ-9 Score 6 14 -  Some recent data might be hidden   GAD 7 : Generalized Anxiety Score 09/15/2017 08/13/2017 05/11/2017 02/10/2017  Nervous, Anxious, on Edge 1 1 1 1   Control/stop worrying 1 1 1 1   Worry too much - different things 1 1 1 1   Trouble relaxing 1 0 0 1  Restless 0 0 0 1  Easily annoyed or irritable 1 1 1 1   Afraid - awful might  happen 0 0 0 0  Total GAD 7 Score 5 4 4 6      ASSESSMENT AND PLAN: 1. Anxiety disorder, unspecified type Symptoms consistent with anxiety/panic disorder.  I recommend starting medication.  He did not feel Lexapro worked for him in the past.  He is willing to try Zoloft. - sertraline (ZOLOFT) 50 MG tablet; 1/2 tab PO  Daily x 2 weeks then 1 tab PO daily  Dispense: 30 tablet; Refill: 1  2. Chronic fatigue Encourage gradual exercise.  Encouraged him to try to get out towards and walk for about 10 minutes several days a week. Await results of sleep test.  3. Essential hypertension -Focus today was more on the anxiety and fatigue.  I forgot to have him increase the amlodipine to 10 mg daily based on persistent elevated diastolic blood pressure.  I will have my nurse call and inform him of this.  4. Tinnitus of both ears We will see if this improve with his anxiety symptoms.  If not we will refer to ENT.  Patient was given the opportunity to ask questions.  Patient verbalized understanding of the plan and was able to repeat key elements of the plan.   No orders of the defined types were placed in this encounter.    Requested Prescriptions   Signed Prescriptions Disp Refills  . sertraline (ZOLOFT) 50 MG tablet 30 tablet 1    Sig: 1/2 tab PO  Daily x 2 weeks then  1 tab PO daily    Return in about 6 weeks (around 10/27/2017).  Karle Plumber, MD, FACP

## 2017-09-20 DIAGNOSIS — I10 Essential (primary) hypertension: Secondary | ICD-10-CM

## 2017-09-20 NOTE — Procedures (Signed)
   Patient Name: Juan Kline, Juan Kline Date: 09/10/2017 Gender: Male D.O.B: [birth year] Age (years): 38 Referring Provider: Sena Slate. Noel Height (inches): 69 Interpreting Physician: Baird Lyons MD, ABSM Weight (lbs): 300 RPSGT: Baxter Flattery BMI: 44 MRN: [id number] Neck Size: 18.00 <br> <br> CLINICAL INFORMATION Sleep Study Type: NPSG  Indication for sleep study: Fatigue, Hypertension, Obesity, Snoring, Witnesses Apnea / Gasping During Sleep  Epworth Sleepiness Score: 13  SLEEP STUDY TECHNIQUE As per the AASM Manual for the Scoring of Sleep and Associated Events v2.3 (April 2016) with a hypopnea requiring 4% desaturations.  The channels recorded and monitored were frontal, central and occipital EEG, electrooculogram (EOG), submentalis EMG (chin), nasal and oral airflow, thoracic and abdominal wall motion, anterior tibialis EMG, snore microphone, electrocardiogram, and pulse oximetry.  MEDICATIONS Medications self-administered by patient taken the night of the study : none reported  SLEEP ARCHITECTURE The study was initiated at 10:57:17 PM and ended at 4:54:13 AM.  Sleep onset time was 10.6 minutes and the sleep efficiency was 71.9%. The total sleep time was 256.5 minutes.  Stage REM latency was 155.5 minutes.  The patient spent 5.65% of the night in stage N1 sleep, 79.73% in stage N2 sleep, 0.19% in stage N3 and 14.42% in REM.  Alpha intrusion was absent.  Supine sleep was 13.00%.  RESPIRATORY PARAMETERS The overall apnea/hypopnea index (AHI) was 80.9 per hour. There were 257 total apneas, including 257 obstructive, 0 central and 0 mixed apneas. There were 89 hypopneas and 10 RERAs.  The AHI during Stage REM sleep was 84.3 per hour.  AHI while supine was 84.5 per hour.  The mean oxygen saturation was 84.57%. The minimum SpO2 during sleep was 51.00%.  loud snoring was noted during this study.  CARDIAC DATA The 2 lead EKG demonstrated sinus rhythm. The mean heart  rate was 77.51 beats per minute. Other EKG findings include: None.  LEG MOVEMENT DATA The total PLMS were 0 with a resulting PLMS index of 0.00. Associated arousal with leg movement index was 0.0 .  IMPRESSIONS - Severe obstructive sleep apnea occurred during this study (AHI = 80.9/h). - No significant central sleep apnea occurred during this study (CAI = 0.0/h). - Severe oxygen desaturation was noted during this study (Min O2 = 51.00%). - The patient snored with loud snoring volume. - No cardiac abnormalities were noted during this study. - Clinically significant periodic limb movements did not occur during sleep. No significant associated arousals.  DIAGNOSIS - Obstructive Sleep Apnea (327.23 [G47.33 ICD-10]) - Nocturnal Hypoxemia (327.26 [G47.36 ICD-10])  RECOMMENDATIONS - Therapeutic CPAP titration to determine optimal pressure required to alleviate sleep disordered breathing. - Be careful with alcohol, sedatives and other CNS depressants that may worsen sleep apnea and disrupt normal sleep architecture. - Sleep hygiene should be reviewed to assess factors that may improve sleep quality. - Weight management and regular exercise should be initiated or continued if appropriate.  [Electronically signed] 09/20/2017 10:24 AM  Baird Lyons MD, ABSM Diplomate, American Board of Sleep Medicine   NPI: [id number]                          [place] [place] [facility]  ELECTRONICALLY SIGNED ON:  09/20/2017, 10:25 AM Axis PH: [phone]   FX: [phone] [place]

## 2017-09-23 MED FILL — AMLODIPINE BESYLATE 10 MG T: 10 | 30 days supply | Qty: 30 | Fill #0

## 2017-09-23 MED FILL — LOSARTAN POTASSIUM 100 MG T: 100 | 30 days supply | Qty: 30 | Fill #10

## 2017-09-23 MED FILL — ?HYDROCHLOROTHIAZIDE 12.5MG: 12.5 | 30 days supply | Qty: 30 | Fill #8

## 2017-09-29 ENCOUNTER — Telehealth: Payer: Self-pay | Admitting: *Deleted

## 2017-09-29 DIAGNOSIS — G4733 Obstructive sleep apnea (adult) (pediatric): Secondary | ICD-10-CM

## 2017-09-29 NOTE — Telephone Encounter (Signed)
Patient verified DOB Patient is aware of OSA being noted and that a CPAP titration is the next step. Patient advised of [facility] contacting him to schedule once PCP is able to place order.

## 2017-10-06 ENCOUNTER — Emergency Department (HOSPITAL_COMMUNITY): Payer: Self-pay

## 2017-10-06 ENCOUNTER — Emergency Department (HOSPITAL_COMMUNITY)
Admission: EM | Admit: 2017-10-06 | Discharge: 2017-10-07 | Disposition: A | Discharge status: Home / Self Care | Payer: Self-pay | Attending: Emergency Medicine | Admitting: Emergency Medicine

## 2017-10-06 ENCOUNTER — Encounter (HOSPITAL_COMMUNITY): Payer: Self-pay

## 2017-10-06 DIAGNOSIS — I10 Essential (primary) hypertension: Secondary | ICD-10-CM | POA: Insufficient documentation

## 2017-10-06 DIAGNOSIS — F17290 Nicotine dependence, other tobacco product, uncomplicated: Secondary | ICD-10-CM | POA: Insufficient documentation

## 2017-10-06 DIAGNOSIS — Z79899 Other long term (current) drug therapy: Secondary | ICD-10-CM | POA: Insufficient documentation

## 2017-10-06 DIAGNOSIS — R0789 Other chest pain: Secondary | ICD-10-CM | POA: Insufficient documentation

## 2017-10-06 LAB — CBC WITH DIFFERENTIAL/PLATELET
BASOS ABS: 0 10*3/uL (ref 0.0–0.1)
BASOS PCT: 0 %
EOS PCT: 1 %
Eosinophils Absolute: 0.1 10*3/uL (ref 0.0–0.7)
HEMATOCRIT: 43.4 % (ref 39.0–52.0)
Hemoglobin: 15 g/dL (ref 13.0–17.0)
Lymphocytes Relative: 11 %
Lymphs Abs: 1.2 10*3/uL (ref 0.7–4.0)
MCH: 31.4 pg (ref 26.0–34.0)
MCHC: 34.6 g/dL (ref 30.0–36.0)
MCV: 91 fL (ref 78.0–100.0)
MONO ABS: 0.5 10*3/uL (ref 0.1–1.0)
MONOS PCT: 5 %
NEUTROS ABS: 9 10*3/uL — AB (ref 1.7–7.7)
Neutrophils Relative %: 83 %
PLATELETS: 238 10*3/uL (ref 150–400)
RBC: 4.77 MIL/uL (ref 4.22–5.81)
RDW: 12.3 % (ref 11.5–15.5)
WBC: 10.8 10*3/uL — ABNORMAL HIGH (ref 4.0–10.5)

## 2017-10-06 LAB — BASIC METABOLIC PANEL
Anion gap: 11 (ref 5–15)
BUN: 29 mg/dL — ABNORMAL HIGH (ref 6–20)
CALCIUM: 9.5 mg/dL (ref 8.9–10.3)
CO2: 27 mmol/L (ref 22–32)
Chloride: 100 mmol/L (ref 98–111)
Creatinine, Ser: 1.04 mg/dL (ref 0.61–1.24)
GLUCOSE: 148 mg/dL — AB (ref 70–99)
Potassium: 3.8 mmol/L (ref 3.5–5.1)
Sodium: 138 mmol/L (ref 135–145)

## 2017-10-06 LAB — I-STAT TROPONIN, ED: TROPONIN I, POC: 0 ng/mL (ref 0.00–0.08)

## 2017-10-06 NOTE — ED Notes (Signed)
Patient transported to X-ray 

## 2017-10-06 NOTE — ED Notes (Signed)
Bed: CN47 Expected date:  Expected time:  Means of arrival:  Comments: 47 yr old short of breath

## 2017-10-06 NOTE — ED Notes (Signed)
ED Provider at bedside. 

## 2017-10-06 NOTE — ED Triage Notes (Signed)
Pt arrived by EMS from home. Pt reports that he started feeling short of breath around 2000 tonight. Pt has been experiencing shortness of breath intermittently for the past 2 months. Pt arrived at 95% on room. Vitals stable on arrival and Pt denies any pain at this time.

## 2017-10-06 NOTE — ED Provider Notes (Signed)
[facility] DEPT Provider Note   CSN: [id number] Arrival date & time: 10/06/17  2136     History   Chief Complaint Chief Complaint  Patient presents with  . Shortness of Breath    HPI Juan Kline is a 47 y.o. male with history of hypertension, peripheral neuropathy is here for evaluation of "strange sensation in the head".  Described as swimmy headed, tenseness that suddenly happened at 2000 today.  Sensation spread to entire body including his chest, upper and lower extremities but worse in upper body.  Associated with lightheadedness, brief, resolved chest tightness and shortness of breath.  Clarifies that his head is not hurting.  Symptoms have been ongoing intermittently, most days of the week for the last 1 year.  States he has been to the ER 6 times for the same.  Currently states he just feels tired but denies headache, chest pain, shortness of breath, abdominal or back pain, vision changes, nausea, vomiting.  States his PCP sent him to cardiology who told him his heart was okay.  Has been monitoring his blood pressure in the last week and states it has been normal.  No head trauma.  No alleviating or aggravating factors.  No interventions PTA.  Recently diagnosed with OSA and is awaiting CPAP. HPI  Past Medical History:  Diagnosis Date  . Chronic foot ulcer ([facility]) admitted 03/02/2014   medial border first metatarsal left foot  . Hypertension Dx 2009  . Osteomyelitis left first metatarsal 03/02/2014  . Venous stasis ulcer of left lower extremity ([place])    [name] 03/02/2014    Patient Active Problem List   Diagnosis Date Noted  . Chest pain 12/12/2016  . Peripheral neuropathy 12/12/2016  . Other fatigue 09/26/2016  . Dizzy 05/11/2015  . Callus of foot 05/11/2015  . Anxiety state 05/11/2015  . Former smoker 05/11/2015  . Prediabetes 11/27/2014  . Muscle spasm 08/21/2014  . Sneezing 06/28/2014  . Other hyperlipidemia 05/22/2014  . Vitamin D  insufficiency 05/22/2014  . Temporal pain 05/19/2014  . Numbness and tingling of foot 05/19/2014  . Venous stasis dermatitis of both lower extremities 05/19/2014  . Uncontrolled hypertension 03/08/2014  . history of Hypertension 03/02/2014  . Morbid obesity ([place]) 03/02/2014    Past Surgical History:  Procedure Laterality Date  . NO PAST SURGERIES          Home Medications    Prior to Admission medications   Medication Sig Start Date End Date Taking? Authorizing Provider  amLODipine (NORVASC) 10 MG tablet Take 1 tablet (10 mg total) by mouth daily. 09/15/17  Yes Ladell Pier, MD  hydrochlorothiazide (HYDRODIURIL) 12.5 MG tablet Take 1 tablet (12.5 mg total) by mouth daily. 08/13/17  Yes Ena Dawley, Tiffany S, PA-C  losartan (COZAAR) 100 MG tablet Take 1 tablet (100 mg total) by mouth daily. 08/13/17  Yes Ena Dawley, Tiffany S, PA-C  sertraline (ZOLOFT) 50 MG tablet 1/2 tab PO  Daily x 2 weeks then 1 tab PO daily Patient taking differently: Take 50 mg by mouth daily.  09/15/17  Yes Ladell Pier, MD    Family History Family History  Problem Relation Age of Onset  . Hypertension Mother   . Hyperlipidemia Mother   . Diabetes Mother   . Kidney disease Mother   . Hypertension Father   . Hyperlipidemia Father   . Diabetes Brother   . Heart disease Maternal Grandmother   . Diabetes Maternal Grandfather   . Heart attack Paternal Grandmother   .  Heart disease Paternal Grandfather     Social History Social History   Tobacco Use  . Smoking status: Current Every Day Smoker    Packs/day: 0.10    Years: 26.00    Pack years: 2.60    Types: E-cigarettes    Last attempt to quit: 02/16/2014    Years since quitting: 3.6  . Smokeless tobacco: Never Used  Substance Use Topics  . Alcohol use: Yes    Comment: 03/02/2014 "I rarely drink"  . Drug use: No     Allergies   Lyrica [pregabalin]   Review of Systems Review of Systems  Respiratory: Positive for shortness of breath  (resolved).   Cardiovascular: Positive for chest pain (resolved).  Neurological: Positive for light-headedness and headaches.  All other systems reviewed and are negative.    Physical Exam Updated Vital Signs BP (!) 124/97   Pulse 88   Temp 97.8 F (36.6 C) (Oral)   Resp 13   Ht 5\' 9"  (1.753 m)   Wt 136.1 kg (300 lb)   SpO2 97%   BMI 44.30 kg/m   Physical Exam  Constitutional: He is oriented to person, place, and time. He appears well-developed and well-nourished.  No distress. Non toxic.   HENT:  Head: Normocephalic and atraumatic.  Nose: Nose normal.  Moist mucous membranes. Oropharynx and tonsils normal.   Eyes: Pupils are equal, round, and reactive to light. EOM are normal.  Neck: Neck supple.  Cardiovascular: Normal rate and regular rhythm.  RRR. No S3.  2+ DP, radial and femoral pulses bilaterally No LE edema  Pulmonary/Chest: Effort normal and breath sounds normal. No respiratory distress.  Lungs CTAB. No crackles. No orthopnea.   Abdominal: Soft. Bowel sounds are normal. There is no tenderness.  No obvious distention.   Neurological: He is alert and oriented to person, place, and time.  Alert and oriented to self, place, time and event.  Speech is fluent without obvious dysarthria or dysphasia. Strength 5/5 with hand grip and ankle F/E.   Sensation to light touch intact in hands and feet. Normal gait/No truncal sway. No pronator drift. No leg drop.  Normal finger-to-nose and finger tapping.  CN I and VIII not tested. CN II-XII grossly intact bilaterally.  Skin: Skin is warm and dry. Capillary refill takes less than 2 seconds.  Psychiatric: He has a normal mood and affect. His behavior is normal. Judgment and thought content normal.     ED Treatments / Results  Labs (all labs ordered are listed, but only abnormal results are displayed) Labs Reviewed  CBC WITH DIFFERENTIAL/PLATELET - Abnormal; Notable for the following components:      Result Value   WBC  10.8 (*)    Neutro Abs 9.0 (*)    All other components within normal limits  BASIC METABOLIC PANEL - Abnormal; Notable for the following components:   Glucose, Bld 148 (*)    BUN 29 (*)    All other components within normal limits  I-STAT TROPONIN, ED    EKG None  Radiology Dg Chest 2 View  Result Date: 10/06/2017 CLINICAL DATA:  47 year old male with shortness of breath. EXAM: CHEST - 2 VIEW COMPARISON:  Chest radiograph dated 08/05/2017 FINDINGS: Shallow inspiration with minimal atelectatic changes. No focal consolidation, pleural effusion, or pneumothorax. The cardiac silhouette is within normal limits with no acute osseous pathology. IMPRESSION: No active cardiopulmonary disease. Electronically Signed   By: Anner Crete M.D.   On: 10/06/2017 22:36   Ct Head Wo  Contrast  Result Date: 10/06/2017 CLINICAL DATA:  47 year old male with head pressure and numbness in both arms. EXAM: CT HEAD WITHOUT CONTRAST TECHNIQUE: Contiguous axial images were obtained from the base of the skull through the vertex without intravenous contrast. COMPARISON:  None. FINDINGS: Brain: The ventricles and sulci appropriate size for patient's age. Minimal periventricular and deep white matter chronic microvascular ischemic changes noted. There is no acute intracranial hemorrhage. No mass effect or midline shift noted. No extra-axial fluid collection. Vascular: No hyperdense vessel or unexpected calcification. Skull: Normal. Negative for fracture or focal lesion. Sinuses/Orbits: No acute finding. Other: None. IMPRESSION: No acute intracranial pathology. Electronically Signed   By: Anner Crete M.D.   On: 10/06/2017 23:42    Procedures Procedures (including critical care time)  Medications Ordered in ED Medications - No data to display   Initial Impression / Assessment and Plan / ED Course  I have reviewed the triage vital signs and the nursing notes.  Pertinent labs & imaging results that were  available during my care of the patient were reviewed by me and considered in my medical decision making (see chart for details).    47 year old here with abnormal sensation in the hand that spread strains entire body associated with lightheadedness, brief chest discomfort and shortness of breath.  Ongoing for greater than 1 year, intermittently occurs every other day.  Describes it as feeling like "i'm going to seize up". He denies thunderclap headache, vision changes, exertional or pleuritic chest pain or shortness of breath.  During these episodes he is not having loss of consciousness, seizure-like activity, bladder/bowel incontinence or retention.  No previous history of seizures in the past.  His blood pressure in the last week has been WNL.  No clinical features to suggest endorgan dysfunction of hypertension.  Question atypical migraine versus headache from OSA.  Chart reviewed reveals patient has been referred to cardiology who performed a stress test and echocardiogram which were normal, he was cleared from cardiology standpoint.  Patient and mother state that cardiology suggested possible follow-up with neurology for his symptoms.  His heart score is low, risks include age, obesity, hypertension although well controlled.  His symptoms are atypical of ACS.  Labs and imaging reviewed and unremarkable. CT head negative. No signs of new, acute, severe EOD.   0023: will hand pt off to oncoming EDPA who will discharge after delta trop assuming normal.  .low suspicion for ACS, seizure, central nervous cause. ?atypical migraines vs BPPV. Pt to fu with PCP who may consider f/u with neurology. Will send PCP a message to facilitate close f/u.    Final Clinical Impressions(s) / ED Diagnoses   Final diagnoses:  Chest discomfort    ED Discharge Orders    None       Arlean Hopping 10/07/17 Theresia Lo, MD 10/07/17 [phone]

## 2017-10-06 NOTE — ED Notes (Signed)
Patient transported to CT 

## 2017-10-07 LAB — I-STAT TROPONIN, ED: Troponin i, poc: 0 ng/mL (ref 0.00–0.08)

## 2017-10-07 NOTE — Discharge Instructions (Addendum)
You were seen in the emergency department for abnormal sensation in the head that spreads over your entire body, lightheadedness, brief discomfort in your chest.  Your lab work, EKG, heart enzymes, chest x-ray, head CT are normal today.  The cause of your symptoms is unclear.  Follow-up with your primary care doctor within a week for reevaluation.  Your primary care doctor may decide to order more tests or refer you as needed.  Return to the ER for fevers, cough, chest pain or shortness of breath with exertion, severe headache with numbness, weakness, difficulty with speech, walking difficulty, seizure or convulsions.

## 2017-10-21 MED FILL — LOSARTAN POTASSIUM 100 MG T: 100 | 30 days supply | Qty: 30 | Fill #11

## 2017-10-21 MED FILL — ?HYDROCHLOROTHIAZIDE 12.5MG: 12.5 | 30 days supply | Qty: 30 | Fill #9

## 2017-10-21 MED FILL — AMLODIPINE BESYLATE 10 MG T: 10 | 30 days supply | Qty: 30 | Fill #1

## 2017-10-21 MED FILL — SERTRALINE HCL 50 MG TABS: 50 | 37 days supply | Qty: 30 | Fill #1

## 2017-10-26 ENCOUNTER — Encounter: Payer: Self-pay | Admitting: Internal Medicine

## 2017-10-26 ENCOUNTER — Ambulatory Visit: Payer: Self-pay | Attending: Internal Medicine | Admitting: Internal Medicine

## 2017-10-26 VITALS — BP 121/90 | HR 111 | Temp 98.2°F | Resp 16 | Wt 295.0 lb

## 2017-10-26 DIAGNOSIS — R7303 Prediabetes: Secondary | ICD-10-CM | POA: Insufficient documentation

## 2017-10-26 DIAGNOSIS — Z8249 Family history of ischemic heart disease and other diseases of the circulatory system: Secondary | ICD-10-CM | POA: Insufficient documentation

## 2017-10-26 DIAGNOSIS — F17210 Nicotine dependence, cigarettes, uncomplicated: Secondary | ICD-10-CM | POA: Insufficient documentation

## 2017-10-26 DIAGNOSIS — Z8349 Family history of other endocrine, nutritional and metabolic diseases: Secondary | ICD-10-CM | POA: Insufficient documentation

## 2017-10-26 DIAGNOSIS — R2990 Unspecified symptoms and signs involving the nervous system: Secondary | ICD-10-CM

## 2017-10-26 DIAGNOSIS — R5382 Chronic fatigue, unspecified: Secondary | ICD-10-CM | POA: Insufficient documentation

## 2017-10-26 DIAGNOSIS — E7849 Other hyperlipidemia: Secondary | ICD-10-CM | POA: Insufficient documentation

## 2017-10-26 DIAGNOSIS — I10 Essential (primary) hypertension: Secondary | ICD-10-CM | POA: Insufficient documentation

## 2017-10-26 DIAGNOSIS — F411 Generalized anxiety disorder: Secondary | ICD-10-CM | POA: Insufficient documentation

## 2017-10-26 DIAGNOSIS — F419 Anxiety disorder, unspecified: Secondary | ICD-10-CM | POA: Insufficient documentation

## 2017-10-26 DIAGNOSIS — Z841 Family history of disorders of kidney and ureter: Secondary | ICD-10-CM | POA: Insufficient documentation

## 2017-10-26 DIAGNOSIS — Z79899 Other long term (current) drug therapy: Secondary | ICD-10-CM | POA: Insufficient documentation

## 2017-10-26 DIAGNOSIS — G4733 Obstructive sleep apnea (adult) (pediatric): Secondary | ICD-10-CM | POA: Insufficient documentation

## 2017-10-26 DIAGNOSIS — Z6841 Body Mass Index (BMI) 40.0 and over, adult: Secondary | ICD-10-CM | POA: Insufficient documentation

## 2017-10-26 DIAGNOSIS — Z833 Family history of diabetes mellitus: Secondary | ICD-10-CM | POA: Insufficient documentation

## 2017-10-26 MED ORDER — SERTRALINE HCL 50 MG PO TABS: 50.0000 mg | ORAL_TABLET | Freq: Every day | ORAL | 5 refills | Status: DC

## 2017-10-26 NOTE — Progress Notes (Signed)
Patient ID: Juan Kline, male    DOB: October 23, 1970  MRN: [id number]  CC: Hypertension   Subjective: Juan Kline is a 47 y.o. male who presents for 6 wks f/u.  His concerns today include:  HTN, HL, anxiety, chronic fatigue, severe OSA  HTN: Reports compliance with blood pressure medications.  He just took losartan and HCTZ a short while ago.  He has not taken amlodipine as yet for the morning.  Checking BP at home occasionally.  Usually 120/80  Seen in the emergency room again on 10/06/2017 for feeling of strange sensation in the head.  He has been getting these episodes since June of last year.  During these episodes he gets a weird feeling in the head, high-pitched ringing in the ears, "then I get warm and shaky.  I could hardly talk."  Feels drained afterwards.  Takes few days before he feels back to his baseline.  Denies any urinary incontinence or loss of consciousness during these episodes. Recent CT head negative.   OSA: Sleep study reveals severe sleep apnea.  He is scheduled for his titration study tomorrow night.    Anxiety: Started on Zoloft on last visit.  He is still taking half of the 50 mg tablets a day.  States he was afraid to increase to the full tablet because he feels he is on too many medications .  Patient Active Problem List   Diagnosis Date Noted  . Chest pain 12/12/2016  . Peripheral neuropathy 12/12/2016  . Other fatigue 09/26/2016  . Dizzy 05/11/2015  . Callus of foot 05/11/2015  . Anxiety state 05/11/2015  . Former smoker 05/11/2015  . Prediabetes 11/27/2014  . Muscle spasm 08/21/2014  . Sneezing 06/28/2014  . Other hyperlipidemia 05/22/2014  . Vitamin D insufficiency 05/22/2014  . Temporal pain 05/19/2014  . Numbness and tingling of foot 05/19/2014  . Venous stasis dermatitis of both lower extremities 05/19/2014  . Uncontrolled hypertension 03/08/2014  . history of Hypertension 03/02/2014  . Morbid obesity ([place]) 03/02/2014     Current Outpatient  Medications on File Prior to Visit  Medication Sig Dispense Refill  . amLODipine (NORVASC) 10 MG tablet Take 1 tablet (10 mg total) by mouth daily. 30 tablet 6  . hydrochlorothiazide (HYDRODIURIL) 12.5 MG tablet Take 1 tablet (12.5 mg total) by mouth daily. 90 tablet 0  . losartan (COZAAR) 100 MG tablet Take 1 tablet (100 mg total) by mouth daily. 90 tablet 0   No current facility-administered medications on file prior to visit.     Allergies  Allergen Reactions  . Lyrica [Pregabalin] Anxiety    Social History   Socioeconomic History  . Marital status: Single    Spouse name: Not on file  . Number of children: Not on file  . Years of education: Not on file  . Highest education level: Not on file  Occupational History  . Not on file  Social Needs  . Financial resource strain: Not on file  . Food insecurity:    Worry: Not on file    Inability: Not on file  . Transportation needs:    Medical: Not on file    Non-medical: Not on file  Tobacco Use  . Smoking status: Current Every Day Smoker    Packs/day: 0.10    Years: 26.00    Pack years: 2.60    Types: E-cigarettes    Last attempt to quit: 02/16/2014    Years since quitting: 3.6  . Smokeless tobacco: Never Used  Substance and Sexual Activity  . Alcohol use: Yes    Comment: 03/02/2014 "I rarely drink"  . Drug use: No  . Sexual activity: Never  Lifestyle  . Physical activity:    Days per week: Not on file    Minutes per session: Not on file  . Stress: Not on file  Relationships  . Social connections:    Talks on phone: Not on file    Gets together: Not on file    Attends religious service: Not on file    Active member of club or organization: Not on file    Attends meetings of clubs or organizations: Not on file    Relationship status: Not on file  . Intimate partner violence:    Fear of current or ex partner: Not on file    Emotionally abused: Not on file    Physically abused: Not on file    Forced sexual  activity: Not on file  Other Topics Concern  . Not on file  Social History Narrative  . Not on file    Family History  Problem Relation Age of Onset  . Hypertension Mother   . Hyperlipidemia Mother   . Diabetes Mother   . Kidney disease Mother   . Hypertension Father   . Hyperlipidemia Father   . Diabetes Brother   . Heart disease Maternal Grandmother   . Diabetes Maternal Grandfather   . Heart attack Paternal Grandmother   . Heart disease Paternal Grandfather     Past Surgical History:  Procedure Laterality Date  . NO PAST SURGERIES      ROS: Review of Systems Negative except as stated above PHYSICAL EXAM: BP (!) 134/95   Pulse (!) 111   Temp 98.2 F (36.8 C) (Oral)   Resp 16   Wt 295 lb (133.8 kg)   SpO2 94%   BMI 43.56 kg/m   121/90, Pox 95%, pulse 100 (manual check) Physical Exam  General appearance - alert, well appearing, and in no distress Mental status - normal mood, behavior, speech, dress, motor activity, and thought processes Neck - supple, no significant adenopathy Chest - clear to auscultation, no wheezes, rales or rhonchi, symmetric air entry Heart - normal rate, regular rhythm, normal S1, S2, no murmurs, rubs, clicks or gallops Neurological - cranial nerves II through XII intact, motor and sensory grossly normal bilaterally.  Fine tremor on outstretched arms more so on the left side.  Gait is normal Extremities -no lower extremity edema   ASSESSMENT AND PLAN: 1. Essential hypertension BP goal is 130/80 or lower.  Encourage him to check BP at least once a week.  Continue to limit salt in the foods.  No changes made in medicines today as he has not taken amlodipine as yet  2. OSA (obstructive sleep apnea) Once we get the results from his sleep titration study we can prescribe a CPAP machine for him.  I will need the case worker to assist him in getting a CPAP machine and mask.  3. Anxiety disorder, unspecified type Recommend increasing the  Zoloft to 50 mg daily - sertraline (ZOLOFT) 50 MG tablet; Take 1 tablet (50 mg total) by mouth daily.  Dispense: 30 tablet; Refill: 5  4. Neurological complaint Will refer to neurology for further evaluation of these episodes that he has been having chest to rule out the possibility of focal seizures. - Ambulatory referral to Neurology   Patient was given the opportunity to ask questions.  Patient verbalized understanding of  the plan and was able to repeat key elements of the plan.   Orders Placed This Encounter  Procedures  . Ambulatory referral to Neurology     Requested Prescriptions   Signed Prescriptions Disp Refills  . sertraline (ZOLOFT) 50 MG tablet 30 tablet 5    Sig: Take 1 tablet (50 mg total) by mouth daily.    Return in about 2 months (around 12/27/2017).  Karle Plumber, MD, FACP

## 2017-10-27 ENCOUNTER — Ambulatory Visit (HOSPITAL_BASED_OUTPATIENT_CLINIC_OR_DEPARTMENT_OTHER): Payer: Self-pay | Attending: Internal Medicine | Admitting: Internal Medicine

## 2017-10-27 VITALS — Ht 69.0 in | Wt 295.0 lb

## 2017-10-27 DIAGNOSIS — G4733 Obstructive sleep apnea (adult) (pediatric): Secondary | ICD-10-CM | POA: Insufficient documentation

## 2017-10-28 ENCOUNTER — Encounter: Payer: Self-pay | Admitting: Neurology

## 2017-10-31 DIAGNOSIS — G4733 Obstructive sleep apnea (adult) (pediatric): Secondary | ICD-10-CM

## 2017-10-31 NOTE — Procedures (Signed)
    Patient Name: Juan Kline, Juan Kline Date: 10/27/2017 Gender: Male D.O.B: [birth year] Age (years): 30 Referring Provider: Karle Plumber MD Height (inches): 69 Interpreting Physician: Baird Lyons MD, ABSM Weight (lbs): 303 RPSGT: Jorge Ny BMI: 45 MRN: [id number] Neck Size: 18.00  CLINICAL INFORMATION The patient is referred for a BiPAP titration to treat sleep apnea.  Date of NPSG, Split Night or HST:   NPSG 09/10/17,  AHI 80.9/ hr, oxygen desaturation to 51%, body weight 300 lbs  SLEEP STUDY TECHNIQUE As per the AASM Manual for the Scoring of Sleep and Associated Events v2.3 (April 2016) with a hypopnea requiring 4% desaturations.  The channels recorded and monitored were frontal, central and occipital EEG, electrooculogram (EOG), submentalis EMG (chin), nasal and oral airflow, thoracic and abdominal wall motion, anterior tibialis EMG, snore microphone, electrocardiogram, and pulse oximetry. Bilevel positive airway pressure (BPAP) was initiated at the beginning of the study and titrated to treat sleep-disordered breathing.  MEDICATIONS Medications self-administered by patient taken the night of the study : none reported  RESPIRATORY PARAMETERS Optimal IPAP Pressure (cm): 19 AHI at Optimal Pressure (/hr) 3.8 Optimal EPAP Pressure (cm): 15   Overall Minimal O2 (%): 62.0 Minimal O2 at Optimal Pressure (%): 82.0 SLEEP ARCHITECTURE Start Time: 10:24:38 PM Stop Time: 5:21:22 AM Total Time (min): 416.7 Total Sleep Time (min): 367.5 Sleep Latency (min): 15.1 Sleep Efficiency (%): 88.2% REM Latency (min): 88.0 WASO (min): 34.1 Stage N1 (%): 3.0% Stage N2 (%): 54.1% Stage N3 (%): 0.0% Stage R (%): 42.86 Supine (%): 22.55 Arousal Index (/hr): 12.4   CARDIAC DATA The 2 lead EKG demonstrated sinus rhythm. The mean heart rate was 71.2 beats per minute. Other EKG findings include: None.  LEG MOVEMENT DATA The total Periodic Limb Movements of Sleep (PLMS) were 0. The PLMS index  was 0.0. A PLMS index of <15 is considered normal in adults.  IMPRESSIONS - CPAP titration did not attain adequate control and was converted to BIPAP titration. - An optimal BIPAP pressure was selected for this patient ( 19 /15 cm of water) - Central sleep apnea was not noted during this titration (CAI = 0.7/h). - Severe oxygen desaturations were observed during this titration (min O2 = 62.0%). At BIPAP 19/15, Min sat 82%, Mean sat 92.8%. - The patient snored with moderate snoring volume. - No cardiac abnormalities were observed during this study. - Clinically significant periodic limb movements were not noted during this study. Arousals associated with PLMs were rare.  DIAGNOSIS - Obstructive Sleep Apnea (327.23 [G47.33 ICD-10])  RECOMMENDATIONS - Trial of BiPAP therapy on 19/15 cm H2O. Patient used a Medium size Fisher&Paykel Full Face Mask Simplus mask and heated humidification. - Be careful with alcohol, sedatives and other CNS depressants that may worsen sleep apnea and disrupt normal sleep architecture. - Sleep hygiene should be reviewed to assess factors that may improve sleep quality. - Weight management and regular exercise should be initiated or continued.  [Electronically signed] 10/31/2017 03:35 PM  Baird Lyons MD, [place], American Board of Sleep Medicine   NPI: [id number]  [place], [place] [facility]  ELECTRONICALLY SIGNED ON:  10/31/2017, 3:31 PM [place] PH: [phone]   FX: [phone] [place]

## 2017-11-02 ENCOUNTER — Ambulatory Visit: Payer: Self-pay | Admitting: Internal Medicine

## 2017-11-03 ENCOUNTER — Ambulatory Visit: Payer: Self-pay | Attending: Internal Medicine

## 2017-11-03 ENCOUNTER — Telehealth: Payer: Self-pay | Admitting: Internal Medicine

## 2017-11-03 NOTE — Telephone Encounter (Signed)
I have sent you another message regarding this.

## 2017-11-03 NOTE — Telephone Encounter (Signed)
Contacted pt to go over sleep study results pt didn't answer left a detailed vm informing pt of sleep study results and to inform him that Dr. Margarita Rana has written a rx for bipap machine and I was wondering if he wanted to pick up the rx or would he like it faxed

## 2017-11-03 NOTE — Telephone Encounter (Signed)
Pt came in to request his final results for his sleep study, please follow up when the final results are available

## 2017-11-03 NOTE — Telephone Encounter (Signed)
Good Morning Dr. Margarita Rana would you be able to provide me the results of pt sleep study. If I was looking correctly looks like he had it done 10/27/17.  Thank you

## 2017-11-04 ENCOUNTER — Telehealth: Payer: Self-pay

## 2017-11-04 NOTE — Telephone Encounter (Signed)
Attempted to contact the patient to discuss options for obtaining his BiPAP machine.  He does not have insurance at this time and may be eligible a refurbished machine through  the CPAP assistance program with the American [employer] Association.  He also has the option of purchasing a new machine through a DME company.  If he wishes to use the CPAP assistance program he will need to come to the office and sign a waiver.   Call placed to # [phone] (M) and  [phone] (H) and HIPAA compliant voicemail messages were left at each # requesting a call back to this CM # [phone].

## 2017-11-04 NOTE — Telephone Encounter (Signed)
Patient came to clinic and signed waiver. Application to Dr Margarita Rana for signature.   The patient stated that he has a mask and tubing for the machine. Informed him that it can be more than a month before the machine is received and he stated that he understood but has no other option.

## 2017-11-04 NOTE — Telephone Encounter (Signed)
Call received from the patient. Explained the option of ordering the BiPAP from a DME company v. CPAP assistance program with [employer].  He stated that he is not working at this time and is not able to afford a machine from a DME company  or the $100 donation.  Explained to him that the [facility] will pay the donation one time. The machines are refurbished and there is no guarantee that comes with the machine  The machine will be delivered to the clinic and he will then need to meet with the respiratory therapist from the hospital for teaching prior begin given the machine. He will also need to sign a waiver for the Sleep Apnea Association.  He stated that he could come to the clinic today to sign the waiver. Instructed him that the form would be left at the front desk. He needs to sign and leave it at the clinic. He stated that he understood.

## 2017-11-06 ENCOUNTER — Telehealth: Payer: Self-pay

## 2017-11-06 NOTE — Telephone Encounter (Signed)
Referral for BiPAP faxed to [employer]

## 2017-11-07 ENCOUNTER — Encounter (HOSPITAL_COMMUNITY): Payer: Self-pay | Admitting: Emergency Medicine

## 2017-11-07 ENCOUNTER — Other Ambulatory Visit: Payer: Self-pay

## 2017-11-07 ENCOUNTER — Emergency Department (HOSPITAL_COMMUNITY)
Admission: EM | Admit: 2017-11-07 | Discharge: 2017-11-07 | Disposition: A | Discharge status: Home / Self Care | Payer: Self-pay | Attending: Emergency Medicine | Admitting: Emergency Medicine

## 2017-11-07 DIAGNOSIS — G43009 Migraine without aura, not intractable, without status migrainosus: Secondary | ICD-10-CM | POA: Insufficient documentation

## 2017-11-07 DIAGNOSIS — F17290 Nicotine dependence, other tobacco product, uncomplicated: Secondary | ICD-10-CM | POA: Insufficient documentation

## 2017-11-07 DIAGNOSIS — I10 Essential (primary) hypertension: Secondary | ICD-10-CM | POA: Insufficient documentation

## 2017-11-07 MED ORDER — KETOROLAC TROMETHAMINE 15 MG/ML IJ SOLN
15.0000 mg | Freq: Once | INTRAMUSCULAR | Status: AC
  Administered 2017-11-07: 15 mg via INTRAVENOUS
  Filled 2017-11-07: qty 1

## 2017-11-07 MED ORDER — PROCHLORPERAZINE EDISYLATE 10 MG/2ML IJ SOLN
10.0000 mg | Freq: Once | INTRAMUSCULAR | Status: AC
  Administered 2017-11-07: 10 mg via INTRAVENOUS
  Filled 2017-11-07: qty 2

## 2017-11-07 MED ORDER — DIPHENHYDRAMINE HCL 50 MG/ML IJ SOLN
25.0000 mg | Freq: Once | INTRAMUSCULAR | Status: AC
  Administered 2017-11-07: 25 mg via INTRAVENOUS
  Filled 2017-11-07: qty 1

## 2017-11-07 MED ORDER — SODIUM CHLORIDE 0.9 % IV BOLUS
1000.0000 mL | Freq: Once | INTRAVENOUS | Status: AC
  Administered 2017-11-07: 1000 mL via INTRAVENOUS

## 2017-11-07 NOTE — ED Triage Notes (Addendum)
Pt c/o head pressure between ears since this morning. Denies visual changes. Denies photosensitivity.

## 2017-11-07 NOTE — ED Notes (Signed)
MD at bedside. 

## 2017-11-07 NOTE — Discharge Instructions (Addendum)
You were evaluated at the [facility] Emergency Department.  After careful evaluation, we did not find any emergent condition requiring admission or further testing in the hospital.   Please return to the Emergency Department if you experience any worsening of your condition.  We encourage you to follow up with a primary care provider.  Please call your neurologist to try to schedule an earlier appointment.  Thank you for allowing [name] to be a part of your care.

## 2017-11-07 NOTE — ED Notes (Signed)
Fluids d/c by MD

## 2017-11-07 NOTE — ED Notes (Signed)
Unsuccessful IV attempt X's 3

## 2017-11-07 NOTE — ED Notes (Addendum)
Pts family came out into hall asking for nurse. Upon going into the room pt is sitting up in bed. Pt states that the medication "made me feel funny. I was panicking" Pts vitals obtained. All vitals WNL. Pt reassured. Pt reports feeling a little better. Pt laid back in bed. Call bell within reach. Will continue to monitor

## 2017-11-07 NOTE — ED Provider Notes (Signed)
[facility] Emergency Department Provider Note MRN:  [id number]  [place] date & time: 11/07/17     Chief Complaint   Headache   History of Present Illness   Lincon Sahlin is a 47 y.o. year-old male with a history of chronic headache presenting to the ED with chief complaint of headache.  The headache has been present intermittently for the past 2 to 3 months.  The patient has a follow-up appointment with neurology in September but the symptoms continue to cause him considerable distress.  The pain is described as a "funny feeling "inside his head".  Denies numbness or weakness in the arms or legs, no slurred speech or confusion, no chest pain or shortness of breath, no abdominal pain.  Headaches are gradual, worse in the morning.  Review of Systems  A complete 10 system review of systems was obtained and all systems are negative except as noted in the HPI and PMH.   Patient's Health History    Past Medical History:  Diagnosis Date  . Chronic foot ulcer ([place]) admitted 03/02/2014   medial border first metatarsal left foot  . Hypertension Dx 2009  . Osteomyelitis left first metatarsal 03/02/2014  . Venous stasis ulcer of left lower extremity ([place])    [name] 03/02/2014    Past Surgical History:  Procedure Laterality Date  . NO PAST SURGERIES      Family History  Problem Relation Age of Onset  . Hypertension Mother   . Hyperlipidemia Mother   . Diabetes Mother   . Kidney disease Mother   . Hypertension Father   . Hyperlipidemia Father   . Diabetes Brother   . Heart disease Maternal Grandmother   . Diabetes Maternal Grandfather   . Heart attack Paternal Grandmother   . Heart disease Paternal Grandfather     Social History   Socioeconomic History  . Marital status: Single    Spouse name: Not on file  . Number of children: Not on file  . Years of education: Not on file  . Highest education level: Not on file  Occupational History  . Not on file  Social  Needs  . Financial resource strain: Not on file  . Food insecurity:    Worry: Not on file    Inability: Not on file  . Transportation needs:    Medical: Not on file    Non-medical: Not on file  Tobacco Use  . Smoking status: Current Every Day Smoker    Packs/day: 0.10    Years: 26.00    Pack years: 2.60    Types: E-cigarettes    Last attempt to quit: 02/16/2014    Years since quitting: 3.7  . Smokeless tobacco: Never Used  Substance and Sexual Activity  . Alcohol use: Yes    Comment: 03/02/2014 "I rarely drink"  . Drug use: No  . Sexual activity: Never  Lifestyle  . Physical activity:    Days per week: Not on file    Minutes per session: Not on file  . Stress: Not on file  Relationships  . Social connections:    Talks on phone: Not on file    Gets together: Not on file    Attends religious service: Not on file    Active member of club or organization: Not on file    Attends meetings of clubs or organizations: Not on file    Relationship status: Not on file  . Intimate partner violence:    Fear of current or  ex partner: Not on file    Emotionally abused: Not on file    Physically abused: Not on file    Forced sexual activity: Not on file  Other Topics Concern  . Not on file  Social History Narrative  . Not on file     Physical Exam  Vital Signs and Nursing Notes reviewed Vitals:   11/07/17 1234 11/07/17 1305  BP:  131/85  Pulse: 94 98  Resp:  18  Temp:    SpO2: 94% 100%    CONSTITUTIONAL: Well-appearing, NAD NEURO:  Alert and oriented x 3, no focal deficits, no meningismus EYES:  eyes equal and reactive ENT/NECK:  no LAD, no JVD CARDIO: Regular rate, well-perfused, normal S1 and S2 PULM:  CTAB no wheezing or rhonchi GI/GU:  normal bowel sounds, non-distended, non-tender MSK/SPINE:  No gross deformities, no edema SKIN:  no rash, atraumatic PSYCH:  Appropriate speech and behavior  Diagnostic and Interventional Summary    EKG  Interpretation  Date/Time:    Ventricular Rate:    PR Interval:    QRS Duration:   QT Interval:    QTC Calculation:   R Axis:     Text Interpretation:        Labs Reviewed - No data to display  No orders to display    Medications  prochlorperazine (COMPAZINE) injection 10 mg (10 mg Intravenous Given 11/07/17 1135)  ketorolac (TORADOL) 15 MG/ML injection 15 mg (15 mg Intravenous Given 11/07/17 1135)  diphenhydrAMINE (BENADRYL) injection 25 mg (25 mg Intravenous Given 11/07/17 1135)  sodium chloride 0.9 % bolus 1,000 mL (0 mLs Intravenous Stopped 11/07/17 1244)     Procedures Critical Care  ED Course and Medical Decision Making  I have reviewed the triage vital signs and the nursing notes.  Pertinent labs & imaging results that were available during my care of the patient were reviewed by me and considered in my medical decision making (see below for details).    47 year old male with history of chronic intermittent headaches for the past month or 2 here with a similar funny feeling in his head.  Vital signs stable, no focal neurological deficits, no meningismus nothing to suggest subarachnoid hemorrhage or meningitis.  Feeling slightly better after migraine cocktail.  CT of his head during recent ED visit unremarkable.  Advised to call his neurology clinic and inform them of his recent ED visit to discuss moving his appointment up to an earlier date.  After the discussed management above, the patient was determined to be safe for discharge.  The patient was in agreement with this plan and all questions regarding their care were answered.  ED return precautions were discussed and the patient will return to the ED with any significant worsening of condition.  Barth Kirks. Sedonia Small, [place] [email or web address]  Final Clinical Impressions(s) / ED Diagnoses     ICD-10-CM   1. Migraine without aura and without status migrainosus, not  intractable G43.009     ED Discharge Orders    None         Maudie Flakes, MD 11/07/17 [phone]

## 2017-11-07 NOTE — ED Notes (Addendum)
Kim RN attempting IV access at this time.

## 2017-11-11 ENCOUNTER — Telehealth: Payer: Self-pay | Admitting: Internal Medicine

## 2017-11-11 NOTE — Telephone Encounter (Signed)
Pt called requesting to speak to Opal Sidles about his CPAP machine and instructions on how to use it, please follow up when possible

## 2017-11-11 NOTE — Telephone Encounter (Signed)
Call returned to the patient. He stated that he received a call from someone at [facility] regarding a CPAP machine. Informed him that he spoke to this CM last week and there are no other calls documented from anyone else at [facility].  Reminded him that it can be a month or more before we receive the machine.  He stated that he understood and must have been listening to an old message from last week.    Call placed to the [employer] - CPAP assistance program. Message left requesting a call back to # [phone] and ask for Opal Sidles or [employer] in order to make the payment for the machine.

## 2017-11-11 NOTE — Addendum Note (Signed)
Addended by: Karle Plumber B on: 11/11/2017 10:40 AM   Modules accepted: Orders

## 2017-11-12 ENCOUNTER — Encounter: Payer: Self-pay | Admitting: Neurology

## 2017-11-12 ENCOUNTER — Ambulatory Visit (INDEPENDENT_AMBULATORY_CARE_PROVIDER_SITE_OTHER): Payer: Self-pay | Admitting: Neurology

## 2017-11-12 ENCOUNTER — Other Ambulatory Visit: Payer: Self-pay

## 2017-11-12 VITALS — BP 110/72 | HR 94 | Ht 69.0 in | Wt 295.0 lb

## 2017-11-12 DIAGNOSIS — R29818 Other symptoms and signs involving the nervous system: Secondary | ICD-10-CM

## 2017-11-12 DIAGNOSIS — H9313 Tinnitus, bilateral: Secondary | ICD-10-CM

## 2017-11-12 NOTE — Progress Notes (Signed)
NEUROLOGY CONSULTATION NOTE  Juan Kline MRN: [id number] DOB: [birth year]  Referring provider: Dr. Karle Plumber Primary care provider: Dr. Karle Plumber  Reason for consult:  Concern for seizures  Dear Dr [name]:  Thank you for your kind referral of Juan Kline for consultation of the above symptoms. Although his history is well known to you, please allow me to reiterate it for the purpose of our medical record. The patient was accompanied to the clinic by his mother who also provides collateral information. Records and images were personally reviewed where available.  HISTORY OF PRESENT ILLNESS: This is a 47 year old right-handed man with a history of hypertension, anxiety, presenting for evaluation of recurrent symptoms that started around June 2018. He states symptoms started Father's Day last year, he suddenly felt strange, shaky, and scared. He noticed ringing in both ears and called EMS. He reports BP was "sky high" the first time, 200/100 per ER notes, in the ER BP was 147/112. He was not taking his BP medications as prescribed. Bloodwork was unremarkable, EKG showed normal sinus rhythm, incomplete RBBB. He was given prn Ativan for anxiety. He was back in the ER the next day for continued symptoms, he described a cold feeling coming over him, anxiety, BP was 166/118. EKG showed NSR, left anterior fascicular block. Since then, he has been to the ER 4 more times for chest pain. He states that he overall does not feel well. Initially he would go a few months without symptoms, but now he has been to the ER every other month. A lot of times in the morning he wakes up and starts hearing a high pitched ringing and ears feel congested. He feels a burning in his head like he has to put a cold rag on it. Then his arms become shaky and he feels warm down his arms. Legs are not affected. He sometimes feels a throbbing inside his head like his pulse, with a little pressure but not pain. He feels  shaky and chilly, no nausea/vomiting. One day he recalls difficulty looking at certain colors, he could not stand to look at it because it looked bright. Another time he felt facial numbness and had difficulty talking lasting a couple of hours. When more severe, he would go to the ER. He would feel wiped out and sleepy afterwards. He states "I never feel good any day, head never feels right." Symptoms are worse when sitting up, he feels normal when supine. He denies any focal weakness, no confusion, olfactory/gustatory hallucinations, myoclonic jerks. He has been diagnosed with severe sleep apnea but is awaiting his CPAP machine due to lack of insurance.   He denies any diplopia, dysarthria/dysphagia, back pain, bowel/bladder dysfunction. He has some neck pain. He has always been an anxious person but was only started on Zoloft a few months ago. He has been unemployed for 10 years due to "lots of different things." His mother reports a few febrile convulsions when he was 67 or 47 years old, no seizure medications started. Otherwise he had a normal birth and early development.  There is no history of CNS infections such as meningitis/encephalitis, significant traumatic brain injury, neurosurgical procedures, or family history of seizures.     PAST MEDICAL HISTORY: Past Medical History:  Diagnosis Date  . Chronic foot ulcer ([place]) admitted 03/02/2014   medial border first metatarsal left foot  . Hypertension Dx 2009  . Osteomyelitis left first metatarsal 03/02/2014  . Venous stasis ulcer of left lower extremity (  [place])    [name] 03/02/2014    PAST SURGICAL HISTORY: Past Surgical History:  Procedure Laterality Date  . NO PAST SURGERIES      MEDICATIONS: Current Outpatient Medications on File Prior to Visit  Medication Sig Dispense Refill  . amLODipine (NORVASC) 10 MG tablet Take 1 tablet (10 mg total) by mouth daily. 30 tablet 6  . hydrochlorothiazide (HYDRODIURIL) 12.5 MG tablet Take 1 tablet  (12.5 mg total) by mouth daily. 90 tablet 0  . losartan (COZAAR) 100 MG tablet Take 1 tablet (100 mg total) by mouth daily. 90 tablet 0  . sertraline (ZOLOFT) 50 MG tablet Take 1 tablet (50 mg total) by mouth daily. 30 tablet 5   No current facility-administered medications on file prior to visit.     ALLERGIES: Allergies  Allergen Reactions  . Lyrica [Pregabalin] Anxiety    FAMILY HISTORY: Family History  Problem Relation Age of Onset  . Hypertension Mother   . Hyperlipidemia Mother   . Diabetes Mother   . Kidney disease Mother   . Hypertension Father   . Hyperlipidemia Father   . Diabetes Brother   . Heart disease Maternal Grandmother   . Diabetes Maternal Grandfather   . Heart attack Paternal Grandmother   . Heart disease Paternal Grandfather     SOCIAL HISTORY: Social History   Socioeconomic History  . Marital status: Single    Spouse name: Not on file  . Number of children: Not on file  . Years of education: Not on file  . Highest education level: Not on file  Occupational History  . Not on file  Social Needs  . Financial resource strain: Not on file  . Food insecurity:    Worry: Not on file    Inability: Not on file  . Transportation needs:    Medical: Not on file    Non-medical: Not on file  Tobacco Use  . Smoking status: Current Every Day Smoker    Packs/day: 0.10    Years: 26.00    Pack years: 2.60    Types: E-cigarettes    Last attempt to quit: 02/16/2014    Years since quitting: 3.7  . Smokeless tobacco: Never Used  Substance and Sexual Activity  . Alcohol use: Yes    Comment: 03/02/2014 "I rarely drink"  . Drug use: No  . Sexual activity: Never  Lifestyle  . Physical activity:    Days per week: Not on file    Minutes per session: Not on file  . Stress: Not on file  Relationships  . Social connections:    Talks on phone: Not on file    Gets together: Not on file    Attends religious service: Not on file    Active member of club or  organization: Not on file    Attends meetings of clubs or organizations: Not on file    Relationship status: Not on file  . Intimate partner violence:    Fear of current or ex partner: Not on file    Emotionally abused: Not on file    Physically abused: Not on file    Forced sexual activity: Not on file  Other Topics Concern  . Not on file  Social History Narrative  . Not on file    REVIEW OF SYSTEMS: Constitutional: No fevers, chills, or sweats, + generalized fatigue,no change in appetite Eyes: No visual changes, double vision, eye pain Ear, nose and throat: No hearing loss, ear pain, nasal congestion, sore throat Cardiovascular:  No chest pain, palpitations Respiratory:  No shortness of breath at rest or with exertion, wheezes GastrointestinaI: No nausea, vomiting, diarrhea, abdominal pain, fecal incontinence Genitourinary:  No dysuria, urinary retention or frequency Musculoskeletal:  + neck pain, no back pain Integumentary: No rash, pruritus, skin lesions Neurological: as above Psychiatric: + depression, insomnia, anxiety Endocrine: No palpitations, fatigue, diaphoresis, mood swings, change in appetite, change in weight, increased thirst Hematologic/Lymphatic:  No anemia, purpura, petechiae. Allergic/Immunologic: no itchy/runny eyes, nasal congestion, recent allergic reactions, rashes  PHYSICAL EXAM: Vitals:   11/12/17 0905  BP: 110/72  Pulse: 94  SpO2: 92%   General: No acute distress, tired appearing, holding his head during the visit Head:  Normocephalic/atraumatic Eyes: Fundoscopic exam shows bilateral sharp discs, no vessel changes, exudates, or hemorrhages Neck: supple, no paraspinal tenderness, full range of motion Back: No paraspinal tenderness Heart: regular rate and rhythm Lungs: Clear to auscultation bilaterally. Vascular: No carotid bruits. Skin/Extremities: No rash, no edema. There are varicosities on both legs.  Neurological Exam: Mental status: alert  and oriented to person, place, and time, no dysarthria or aphasia, Fund of knowledge is appropriate.  Recent and remote memory are intact. 3/3 delayed recall. Attention and concentration are normal.    Able to name objects and repeat phrases. Cranial nerves: CN I: not tested CN II: pupils equal, round and reactive to light, visual fields intact, fundi unremarkable. CN III, IV, VI:  full range of motion, no nystagmus, no ptosis CN V: facial sensation intact CN VII: upper and lower face symmetric CN VIII: hearing intact to finger rub CN IX, X: gag intact, uvula midline CN XI: sternocleidomastoid and trapezius muscles intact CN XII: tongue midline Bulk & Tone: normal, no fasciculations. Motor: 5/5 throughout with no pronator drift. Sensation: intact to light touch, cold, pin, vibration and joint position sense.  No extinction to double simultaneous stimulation.  Romberg test negative Deep Tendon Reflexes: +1 throughout, no ankle clonus Plantar responses: downgoing bilaterally Cerebellar: no incoordination on finger to nose, heel to shin. No dysdiadochokinesia Gait: narrow-based and steady, able to tandem walk adequately. Tremor: none  IMPRESSION: This is a 47 year old right-handed man with a history of hypertension, anxiety, recently diagnosed severe sleep apnea, presenting for evaluation of possible seizures. He has been having recurrent episodes where he would feel strange, with high pitched tinnitus, burning in his head, shakiness. This would make him feel scared, he has been to the ER several times. Initially BP was significantly elevated, but with recent ER visits, BP has been better. He has constant generalized malaise. His neurological exam is normal. Etiology of symptoms unclear, it is unclear if these are neurological in nature or psychological from anxiety. MRI brain with and without contrast will be ordered to assess for underlying structural abnormality. Check ESR, CRP, ANA, B12. A  1-hour EEG will be done, we may do a prolonged EEG to further classify his symptoms. He will follow-up after the tests and knows to call for any changes.   Thank you for allowing me to participate in the care of this patient. Please do not hesitate to call for any questions or concerns.   Ellouise Newer, M.D.  CC: Dr. [name]

## 2017-11-12 NOTE — Patient Instructions (Addendum)
1. Schedule MRI brain with and without contrast We have sent a referral to [facility] for your MRI and they will call you directly to schedule your appt. They are located at [place]. If you need to contact them directly please call [phone]. 2. Schedule 1-hour EEG for Monday 3. Bloodwork for ESR, CRP, ANA, B12 Your provider has requested that you have labwork completed today. Please go to [facility] ([id number]) on the second floor of this building before leaving the office today. You do not need to check in. If you are not called within 15 minutes please check with the front desk.  4. Follow-up after tests, call for any changes

## 2017-11-15 ENCOUNTER — Emergency Department (HOSPITAL_COMMUNITY): Payer: Self-pay

## 2017-11-15 ENCOUNTER — Encounter (HOSPITAL_COMMUNITY): Payer: Self-pay | Admitting: Emergency Medicine

## 2017-11-15 ENCOUNTER — Other Ambulatory Visit: Payer: Self-pay

## 2017-11-15 ENCOUNTER — Emergency Department (HOSPITAL_COMMUNITY)
Admission: EM | Admit: 2017-11-15 | Discharge: 2017-11-16 | Disposition: A | Discharge status: Home / Self Care | Payer: Self-pay | Attending: Emergency Medicine | Admitting: Emergency Medicine

## 2017-11-15 DIAGNOSIS — F419 Anxiety disorder, unspecified: Secondary | ICD-10-CM

## 2017-11-15 DIAGNOSIS — H9319 Tinnitus, unspecified ear: Secondary | ICD-10-CM | POA: Insufficient documentation

## 2017-11-15 DIAGNOSIS — R202 Paresthesia of skin: Secondary | ICD-10-CM | POA: Insufficient documentation

## 2017-11-15 DIAGNOSIS — R7303 Prediabetes: Secondary | ICD-10-CM | POA: Insufficient documentation

## 2017-11-15 DIAGNOSIS — Z79899 Other long term (current) drug therapy: Secondary | ICD-10-CM | POA: Insufficient documentation

## 2017-11-15 DIAGNOSIS — I10 Essential (primary) hypertension: Secondary | ICD-10-CM | POA: Insufficient documentation

## 2017-11-15 DIAGNOSIS — F17290 Nicotine dependence, other tobacco product, uncomplicated: Secondary | ICD-10-CM | POA: Insufficient documentation

## 2017-11-15 MED ORDER — ALBUTEROL SULFATE (2.5 MG/3ML) 0.083% IN NEBU
5.0000 mg | INHALATION_SOLUTION | Freq: Once | RESPIRATORY_TRACT | Status: DC
  Filled 2017-11-15: qty 6

## 2017-11-15 NOTE — ED Triage Notes (Signed)
Patient complaining of neck pain, tingling in hands, and having sob. Patient states it started around 9 tonight.

## 2017-11-16 ENCOUNTER — Ambulatory Visit (INDEPENDENT_AMBULATORY_CARE_PROVIDER_SITE_OTHER): Payer: Self-pay | Admitting: Neurology

## 2017-11-16 ENCOUNTER — Encounter (HOSPITAL_COMMUNITY): Payer: Self-pay | Admitting: Emergency Medicine

## 2017-11-16 DIAGNOSIS — R29818 Other symptoms and signs involving the nervous system: Secondary | ICD-10-CM

## 2017-11-16 DIAGNOSIS — H9313 Tinnitus, bilateral: Secondary | ICD-10-CM

## 2017-11-16 LAB — I-STAT CHEM 8, ED
BUN: 25 mg/dL — ABNORMAL HIGH (ref 6–20)
CALCIUM ION: 1.12 mmol/L — AB (ref 1.15–1.40)
CHLORIDE: 101 mmol/L (ref 98–111)
Creatinine, Ser: 0.9 mg/dL (ref 0.61–1.24)
Glucose, Bld: 133 mg/dL — ABNORMAL HIGH (ref 70–99)
HCT: 43 % (ref 39.0–52.0)
Hemoglobin: 14.6 g/dL (ref 13.0–17.0)
Potassium: 3.5 mmol/L (ref 3.5–5.1)
Sodium: 138 mmol/L (ref 135–145)
TCO2: 27 mmol/L (ref 22–32)

## 2017-11-16 LAB — CBC WITH DIFFERENTIAL/PLATELET
Basophils Absolute: 0 10*3/uL (ref 0.0–0.1)
Basophils Relative: 0 %
Eosinophils Absolute: 0.1 10*3/uL (ref 0.0–0.7)
Eosinophils Relative: 1 %
HCT: 43.6 % (ref 39.0–52.0)
Hemoglobin: 15 g/dL (ref 13.0–17.0)
LYMPHS ABS: 1.6 10*3/uL (ref 0.7–4.0)
LYMPHS PCT: 14 %
MCH: 31.1 pg (ref 26.0–34.0)
MCHC: 34.4 g/dL (ref 30.0–36.0)
MCV: 90.3 fL (ref 78.0–100.0)
Monocytes Absolute: 0.7 10*3/uL (ref 0.1–1.0)
Monocytes Relative: 7 %
NEUTROS ABS: 8.7 10*3/uL — AB (ref 1.7–7.7)
NEUTROS PCT: 78 %
Platelets: 278 10*3/uL (ref 150–400)
RBC: 4.83 MIL/uL (ref 4.22–5.81)
RDW: 12.9 % (ref 11.5–15.5)
WBC: 11.1 10*3/uL — AB (ref 4.0–10.5)

## 2017-11-16 LAB — I-STAT TROPONIN, ED: Troponin i, poc: 0 ng/mL (ref 0.00–0.08)

## 2017-11-16 LAB — VITAMIN B12: Vitamin B-12: 698 pg/mL (ref 200–1100)

## 2017-11-16 LAB — SEDIMENTATION RATE: Sed Rate: 39 mm/h — ABNORMAL HIGH (ref 0–15)

## 2017-11-16 LAB — ANA: ANA: NEGATIVE

## 2017-11-16 LAB — C-REACTIVE PROTEIN: CRP: 23.6 mg/L — ABNORMAL HIGH (ref <8.0)

## 2017-11-16 MED ORDER — MECLIZINE HCL 25 MG PO TABS
25.0000 mg | ORAL_TABLET | Freq: Once | ORAL | Status: AC
  Administered 2017-11-16: 25 mg via ORAL
  Filled 2017-11-16: qty 1

## 2017-11-16 NOTE — ED Provider Notes (Signed)
[facility] DEPT Provider Note   CSN: [id number] Arrival date & time: 11/15/17  2323     History   Chief Complaint Chief Complaint  Patient presents with  . Tinnitus    HPI Juan Kline is a 47 y.o. male.  The history is provided by the patient and a relative.  Illness  This is a chronic problem. The current episode started more than 1 week ago (months ago). The problem occurs constantly. Progression since onset: waxing and waning. Pertinent negatives include no chest pain, no abdominal pain, no headaches and no shortness of breath. Associated symptoms comments: Tinnitus weird feeling in his head tingling in all extremities when it worses . Nothing aggravates the symptoms. Nothing relieves the symptoms. Treatments tried: migraine cocktail. The treatment provided no relief.  Patient with h/o anxiety presents with ongoing "weird feeling" in his head tingling all over tinnitus and family reports panic like behavior.  Has been seen for this and given migraine cocktail which he reports made it worse.  He has seen neurology and is being worked up for a myriad of issues.  He has also had a CRP ANA and full rheumatologic work up all which were unremarkable.  He states he does not believe this is anxiety or panic as he is already on zoloft.  No CP, he DENIES SOB, no leg pain, no long car trips or plane trips.  No weakness nor changes in vision or speech.  No exertional CP no DOE.    Past Medical History:  Diagnosis Date  . Chronic foot ulcer ([facility]) admitted 03/02/2014   medial border first metatarsal left foot  . Hypertension Dx 2009  . Osteomyelitis left first metatarsal 03/02/2014  . Venous stasis ulcer of left lower extremity ([place])    [name] 03/02/2014    Patient Active Problem List   Diagnosis Date Noted  . OSA (obstructive sleep apnea) 10/26/2017  . Anxiety disorder 10/26/2017  . Peripheral neuropathy 12/12/2016  . Anxiety state 05/11/2015  . Former  smoker 05/11/2015  . Prediabetes 11/27/2014  . Other hyperlipidemia 05/22/2014  . Vitamin D insufficiency 05/22/2014  . Numbness and tingling of foot 05/19/2014  . Venous stasis dermatitis of both lower extremities 05/19/2014  . history of Hypertension 03/02/2014  . Morbid obesity ([place]) 03/02/2014    Past Surgical History:  Procedure Laterality Date  . NO PAST SURGERIES          Home Medications    Prior to Admission medications   Medication Sig Start Date End Date Taking? Authorizing Provider  amLODipine (NORVASC) 10 MG tablet Take 1 tablet (10 mg total) by mouth daily. 09/15/17   Ladell Pier, MD  hydrochlorothiazide (HYDRODIURIL) 12.5 MG tablet Take 1 tablet (12.5 mg total) by mouth daily. 08/13/17   Brayton Caves, PA-C  losartan (COZAAR) 100 MG tablet Take 1 tablet (100 mg total) by mouth daily. 08/13/17   Brayton Caves, PA-C  sertraline (ZOLOFT) 50 MG tablet Take 1 tablet (50 mg total) by mouth daily. 10/26/17   Ladell Pier, MD    Family History Family History  Problem Relation Age of Onset  . Hypertension Mother   . Hyperlipidemia Mother   . Diabetes Mother   . Kidney disease Mother   . Hypertension Father   . Hyperlipidemia Father   . Diabetes Brother   . Heart disease Maternal Grandmother   . Diabetes Maternal Grandfather   . Heart attack Paternal Grandmother   . Heart disease Paternal  Grandfather     Social History Social History   Tobacco Use  . Smoking status: Current Every Day Smoker    Packs/day: 0.10    Years: 26.00    Pack years: 2.60    Types: E-cigarettes    Last attempt to quit: 02/16/2014    Years since quitting: 3.7  . Smokeless tobacco: Never Used  Substance Use Topics  . Alcohol use: Yes    Comment: 03/02/2014 "I rarely drink"  . Drug use: No     Allergies   Lyrica [pregabalin]   Review of Systems Review of Systems  HENT: Positive for tinnitus. Negative for facial swelling and trouble swallowing.   Respiratory:  Negative for cough, shortness of breath, wheezing and stridor.   Cardiovascular: Negative for chest pain, palpitations and leg swelling.  Gastrointestinal: Negative for abdominal pain and vomiting.  Musculoskeletal: Negative for neck pain and neck stiffness.  Neurological: Negative for seizures, facial asymmetry, speech difficulty, weakness, light-headedness and headaches.  Psychiatric/Behavioral: Positive for sleep disturbance. Negative for self-injury and suicidal ideas. The patient is nervous/anxious.   All other systems reviewed and are negative.    Physical Exam Updated Vital Signs BP 130/89   Pulse (!) 107   Temp 98 F (36.7 C) (Oral)   Resp 20   Ht 5' 9"  (1.753 m)   Wt 133.8 kg (295 lb)   SpO2 95%   BMI 43.56 kg/m   Physical Exam  Constitutional: He is oriented to person, place, and time. He appears well-developed and well-nourished. No distress.  HENT:  Head: Normocephalic and atraumatic.  Nose: Nose normal.  Eyes: Pupils are equal, round, and reactive to light. Conjunctivae and EOM are normal.  Neck: Normal range of motion. Neck supple.  Cardiovascular: Normal rate, regular rhythm, normal heart sounds and intact distal pulses.  Pulmonary/Chest: Effort normal and breath sounds normal. No stridor. He has no wheezes. He has no rales.  Abdominal: Soft. Bowel sounds are normal. He exhibits no mass. There is no tenderness. There is no rebound and no guarding.  Musculoskeletal: Normal range of motion. He exhibits no edema, tenderness or deformity.  Neurological: He is oriented to person, place, and time. He displays normal reflexes. No cranial nerve deficit.  Skin: Skin is warm and dry. Capillary refill takes less than 2 seconds.  Psychiatric: His mood appears anxious. His affect is angry.     ED Treatments / Results  Labs (all labs ordered are listed, but only abnormal results are displayed) Results for orders placed or performed during the hospital encounter of 11/15/17   CBC with Differential/Platelet  Result Value Ref Range   WBC 11.1 (H) 4.0 - 10.5 K/uL   RBC 4.83 4.22 - 5.81 MIL/uL   Hemoglobin 15.0 13.0 - 17.0 g/dL   HCT 43.6 39.0 - 52.0 %   MCV 90.3 78.0 - 100.0 fL   MCH 31.1 26.0 - 34.0 pg   MCHC 34.4 30.0 - 36.0 g/dL   RDW 12.9 11.5 - 15.5 %   Platelets 278 150 - 400 K/uL   Neutrophils Relative % 78 %   Neutro Abs 8.7 (H) 1.7 - 7.7 K/uL   Lymphocytes Relative 14 %   Lymphs Abs 1.6 0.7 - 4.0 K/uL   Monocytes Relative 7 %   Monocytes Absolute 0.7 0.1 - 1.0 K/uL   Eosinophils Relative 1 %   Eosinophils Absolute 0.1 0.0 - 0.7 K/uL   Basophils Relative 0 %   Basophils Absolute 0.0 0.0 - 0.1 K/uL  I-Stat Chem 8, ED  Result Value Ref Range   Sodium 138 135 - 145 mmol/L   Potassium 3.5 3.5 - 5.1 mmol/L   Chloride 101 98 - 111 mmol/L   BUN 25 (H) 6 - 20 mg/dL   Creatinine, Ser 0.90 0.61 - 1.24 mg/dL   Glucose, Bld 133 (H) 70 - 99 mg/dL   Calcium, Ion 1.12 (L) 1.15 - 1.40 mmol/L   TCO2 27 22 - 32 mmol/L   Hemoglobin 14.6 13.0 - 17.0 g/dL   HCT 43.0 39.0 - 52.0 %  I-stat troponin, ED  Result Value Ref Range   Troponin i, poc 0.00 0.00 - 0.08 ng/mL   Comment 3           Dg Chest 2 View  Result Date: 11/15/2017 CLINICAL DATA:  47 y/o M; shortness of breath with throbbing chest pressure and left arm numbness. EXAM: CHEST - 2 VIEW COMPARISON:  10/06/2017 chest radiograph. FINDINGS: Stable cardiac silhouette given projection and technique. Aortic atherosclerosis with calcification. Clear lungs. No pleural effusion or pneumothorax. No acute osseous abnormality is evident. IMPRESSION: No acute pulmonary process identified.  Aortic atherosclerosis. Electronically Signed   By: Kristine Garbe M.D.   On: 11/15/2017 23:52    EKG EKG Interpretation  Date/Time:  Sunday November 15 2017 23:33:13 EDT Ventricular Rate:  103 PR Interval:    QRS Duration: 109 QT Interval:  334 QTC Calculation: 438 R Axis:   -76 Text Interpretation:  Sinus  tachycardia Borderline low voltage, extremity leads Consider anterior infarct Confirmed by Randal Buba, Jaana Brodt ([id number]) on 11/15/2017 11:58:31 PM   Radiology Dg Chest 2 View  Result Date: 11/15/2017 CLINICAL DATA:  47 y/o M; shortness of breath with throbbing chest pressure and left arm numbness. EXAM: CHEST - 2 VIEW COMPARISON:  10/06/2017 chest radiograph. FINDINGS: Stable cardiac silhouette given projection and technique. Aortic atherosclerosis with calcification. Clear lungs. No pleural effusion or pneumothorax. No acute osseous abnormality is evident. IMPRESSION: No acute pulmonary process identified.  Aortic atherosclerosis. Electronically Signed   By: Kristine Garbe M.D.   On: 11/15/2017 23:52    Procedures Procedures (including critical care time)  Medications Ordered in ED Medications  albuterol (PROVENTIL) (2.5 MG/3ML) 0.083% nebulizer solution 5 mg (5 mg Nebulization Not Given 11/16/17 0040)  meclizine (ANTIVERT) tablet 25 mg (25 mg Oral Given 11/16/17 0053)      Final Clinical Impressions(s) / ED Diagnoses  This appears to be anxiety related.  Lungs are clear. No focal neuro deficits, recent normal CT head.  Follow up with your PMD and neurology for ongoing care.    Return for pain, numbness, changes in vision or speech, fevers >100.4 unrelieved by medication, shortness of breath, intractable vomiting, or diarrhea, abdominal pain, Inability to tolerate liquids or food, cough, altered mental status or any concerns. No signs of systemic illness or infection. The patient is nontoxic-appearing on exam and vital signs are within normal limits. Will refer to urology for microscopy hematuria as patient is asymptomatic.  I have reviewed the triage vital signs and the nursing notes. Pertinent labs &imaging results that were available during my care of the patient were reviewed by me and considered in my medical decision making (see chart for details).  After history, exam, and  medical workup I feel the patient has been appropriately medically screened and is safe for discharge home. Pertinent diagnoses were discussed with the patient. Patient was given return precautions.      Coline Calkin, MD 11/16/17 [id number]

## 2017-11-18 ENCOUNTER — Telehealth: Payer: Self-pay | Admitting: Neurology

## 2017-11-18 NOTE — Telephone Encounter (Signed)
Spoke with Juan Kline's mother, Katharine Look, letting her know that EEG results have not been read yet and that I will call her as soon as they are.  Katharine Look states that Juan Kline was seen in the ED over the weekend.  States that he was having an episode of shaking and his face became hot.  Per ED notes, his symptoms seemed anxiety related.  Katharine Look states that Juan Kline is afraid to stay alone due to these episodes.  During these episodes he experiences shaking, hot face/extremities, weakness and head pressure.  Might not experience all symptoms.  After episodes, Juan Kline it "wipped out".  Katharine Look states that sometimes she feels uncomfortable leaving Juan Kline alone in case he has another episode. Please advise.

## 2017-11-18 NOTE — Telephone Encounter (Signed)
MRI is scheduled for Thursday, August 15

## 2017-11-18 NOTE — Telephone Encounter (Signed)
Patient's mother called and said that her son had to go to the ER over the weekend due to having "Episodes" but not Seizures. She said he seems to be having one now. She said they were also checking on EEG results from 11/16/2017? Please Call. Thanks

## 2017-11-18 NOTE — Telephone Encounter (Signed)
Pls let patient/mother know the brain wave test was normal. When is he scheduled for the MRI brain? Thanks

## 2017-11-20 MED FILL — HYDROCHLOROTHIAZIDE 12.5 MG: 12.5 | 30 days supply | Qty: 30 | Fill #10

## 2017-11-20 MED FILL — LOSARTAN POTASSIUM 100 MG T: 100 | 30 days supply | Qty: 30 | Fill #0

## 2017-11-20 MED FILL — AMLODIPINE BESYLATE 10 MG T: 10 | 30 days supply | Qty: 30 | Fill #2

## 2017-11-20 MED FILL — SERTRALINE HCL 50 MG TABS: 50 | 30 days supply | Qty: 30 | Fill #0

## 2017-11-24 ENCOUNTER — Encounter: Payer: Self-pay | Admitting: Neurology

## 2017-11-26 ENCOUNTER — Ambulatory Visit
Admission: RE | Admit: 2017-11-26 | Discharge: 2017-11-26 | Disposition: A | Discharge status: Home / Self Care | Payer: No Typology Code available for payment source | Source: Ambulatory Visit | Attending: Neurology | Admitting: Neurology

## 2017-11-26 DIAGNOSIS — H9313 Tinnitus, bilateral: Secondary | ICD-10-CM

## 2017-11-26 DIAGNOSIS — R29818 Other symptoms and signs involving the nervous system: Secondary | ICD-10-CM

## 2017-11-26 MED ORDER — GADOBENATE DIMEGLUMINE 529 MG/ML IV SOLN
20.0000 mL | Freq: Once | INTRAVENOUS | Status: AC | PRN
  Administered 2017-11-26: 20 mL via INTRAVENOUS

## 2017-11-29 NOTE — Procedures (Signed)
ELECTROENCEPHALOGRAM REPORT  Date of Study: 11/16/2017  Patient's Name: Juan Kline MRN: [id number] Date of Birth: 13-Feb-1971  Referring Provider: Dr. Ellouise Newer  Clinical History: This is a 47 year old man with recurrent spells where he has a strange sensation in the head, swimmy headed, with sensation spreading to his entire body. EEG for classification.  Medications: NORVASC 10 MG tablet  HYDRODIURIL 12.5 MG tablet  COZAAR 100 MG tablet ZOLOFT 50 MG tabl  Technical Summary: A multichannel digital 1-hour EEG recording measured by the international 10-20 system with electrodes applied with paste and impedances below 5000 ohms performed in our laboratory with EKG monitoring in an awake and drowsy patient.  Hyperventilation and photic stimulation were performed.  The digital EEG was referentially recorded, reformatted, and digitally filtered in a variety of bipolar and referential montages for optimal display.    Description: The patient is awake and drowsy during the recording.  During maximal wakefulness, there is a symmetric, medium voltage 9-9.5 Hz posterior dominant rhythm that attenuates with eye opening.  The record is symmetric.  During drowsiness, there is an increase in theta slowing of the background.  Deeper stages of sleep were not captured. Hyperventilation and photic stimulation did not elicit any abnormalities. Patient reported a spell prior to recording, resolved when EEG started. There were no epileptiform discharges or electrographic seizures seen.    EKG lead was unremarkable.  Impression: This 1-hour awake and asleep EEG is normal.    Clinical Correlation: A normal EEG does not exclude a clinical diagnosis of epilepsy.  If further clinical questions remain, prolonged EEG may be helpful.  Clinical correlation is advised.   Ellouise Newer, M.D.

## 2017-12-02 ENCOUNTER — Telehealth: Payer: Self-pay

## 2017-12-02 DIAGNOSIS — R29818 Other symptoms and signs involving the nervous system: Secondary | ICD-10-CM

## 2017-12-02 NOTE — Telephone Encounter (Signed)
Spoke with pt relaying message below.  He is agreeable to 48 hour EEG.  Pt scheduled for 12/04/17 - appointment canceled.

## 2017-12-02 NOTE — Telephone Encounter (Signed)
Orders placed and staff message sent to Laureen Ochs.

## 2017-12-02 NOTE — Telephone Encounter (Signed)
-----   Message from Cameron Sprang, MD sent at 11/30/2017  9:55 AM EDT ----- Pls let patient know the MRI brain was normal, no evidence of tumor, stroke, or bleed. So far no clear neurological cause for his symptoms, but would like to do a 48-hour EEG to try to capture the spells and see what the brain waves show when he is having one. Thanks

## 2017-12-04 ENCOUNTER — Ambulatory Visit: Payer: No Typology Code available for payment source | Admitting: Neurology

## 2017-12-08 ENCOUNTER — Telehealth: Payer: Self-pay | Admitting: Internal Medicine

## 2017-12-08 NOTE — Telephone Encounter (Signed)
Patient would like to know that status of his CPAP machine, please follow up.

## 2017-12-09 NOTE — Telephone Encounter (Signed)
Call returned to the patient and informed him that we are still waiting for the CPAP machine from the [employer] and it has been taking months to receive the units. He has the options of obtaining a CPAP from a local DME supplier but he would have to pay for that and he said that he is unable to afford it.  Reminded him that the [facility] would contact him when the machine has arrived and teaching has been scheduled with the hospital respiratory therapist.

## 2017-12-10 ENCOUNTER — Telehealth: Payer: Self-pay

## 2017-12-10 NOTE — Telephone Encounter (Signed)
Call received from [facility] with [employer]. $100 donation paid by [facility].  She said that the machine should be ready for shipment in 1-2 days.

## 2017-12-10 NOTE — Telephone Encounter (Signed)
Call placed to the [employer] # [phone] in attempt to make the payment for the CPAP machine. Voicemail message left with requesting call back to this CM or [place] # [phone].

## 2017-12-21 MED FILL — LOSARTAN POTASSIUM 100 MG T: 100 | 30 days supply | Qty: 30 | Fill #1

## 2017-12-21 MED FILL — HYDROCHLOROTHIAZIDE 12.5 MG: 12.5 | 30 days supply | Qty: 30 | Fill #11

## 2017-12-21 MED FILL — AMLODIPINE BESYLATE 10 MG T: 10 | 30 days supply | Qty: 30 | Fill #3

## 2017-12-21 MED FILL — SERTRALINE HCL 50 MG TABS: 50 | 30 days supply | Qty: 30 | Fill #1

## 2017-12-31 ENCOUNTER — Ambulatory Visit: Payer: Self-pay | Attending: Internal Medicine | Admitting: Internal Medicine

## 2017-12-31 ENCOUNTER — Encounter: Payer: Self-pay | Admitting: Internal Medicine

## 2017-12-31 VITALS — BP 122/80 | HR 96 | Temp 97.5°F | Resp 16 | Wt 308.8 lb

## 2017-12-31 DIAGNOSIS — R5382 Chronic fatigue, unspecified: Secondary | ICD-10-CM | POA: Insufficient documentation

## 2017-12-31 DIAGNOSIS — I10 Essential (primary) hypertension: Secondary | ICD-10-CM | POA: Insufficient documentation

## 2017-12-31 DIAGNOSIS — Z23 Encounter for immunization: Secondary | ICD-10-CM | POA: Insufficient documentation

## 2017-12-31 DIAGNOSIS — Z6841 Body Mass Index (BMI) 40.0 and over, adult: Secondary | ICD-10-CM | POA: Insufficient documentation

## 2017-12-31 DIAGNOSIS — E7849 Other hyperlipidemia: Secondary | ICD-10-CM | POA: Insufficient documentation

## 2017-12-31 DIAGNOSIS — Z79899 Other long term (current) drug therapy: Secondary | ICD-10-CM | POA: Insufficient documentation

## 2017-12-31 DIAGNOSIS — Z833 Family history of diabetes mellitus: Secondary | ICD-10-CM | POA: Insufficient documentation

## 2017-12-31 DIAGNOSIS — G4733 Obstructive sleep apnea (adult) (pediatric): Secondary | ICD-10-CM | POA: Insufficient documentation

## 2017-12-31 DIAGNOSIS — Z8249 Family history of ischemic heart disease and other diseases of the circulatory system: Secondary | ICD-10-CM | POA: Insufficient documentation

## 2017-12-31 DIAGNOSIS — F419 Anxiety disorder, unspecified: Secondary | ICD-10-CM | POA: Insufficient documentation

## 2017-12-31 DIAGNOSIS — F17210 Nicotine dependence, cigarettes, uncomplicated: Secondary | ICD-10-CM | POA: Insufficient documentation

## 2017-12-31 NOTE — Patient Instructions (Addendum)
Follow a Healthy Eating Plan - You can do it! Limit sugary drinks.  Avoid sodas, sweet tea, sport or energy drinks, or fruit drinks.  Drink water, lo-fat milk, or diet drinks. Limit snack foods.   Cut back on candy, cake, cookies, chips, ice cream.  These are a special treat, only in small amounts. Eat plenty of vegetables.  Especially dark green, red, and orange vegetables. Aim for at least 3 servings a day. More is better! Include fruit in your daily diet.  Whole fruit is much healthier than fruit juice! Limit "white" bread, "white" pasta, "white" rice.   Choose "100% whole grain" products, brown or wild rice. Avoid fatty meats. Try "Meatless Monday" and choose eggs or beans one day a week.  When eating meat, choose lean meats like chicken, [place], and fish.  Grill, broil, or bake meats instead of frying, and eat poultry without the skin. Eat less salt.  Avoid frozen pizzas, frozen dinners and salty foods.  Use seasonings other than salt in cooking.  This can help blood pressure and keep you from swelling Beer, wine and liquor have calories.  If you can safely drink alcohol, limit to 1 drink per day for women, 2 drinks for men   Influenza Virus Vaccine injection (Fluarix) What is this medicine? INFLUENZA VIRUS VACCINE (in floo EN zuh VAHY ruhs vak SEEN) helps to reduce the risk of getting influenza also known as the flu. This medicine may be used for other purposes; ask your health care provider or pharmacist if you have questions. COMMON BRAND NAME(S): Fluarix, Fluzone What should I tell my health care provider before I take this medicine? They need to know if you have any of these conditions: -bleeding disorder like hemophilia -fever or infection -Guillain-Barre syndrome or other neurological problems -immune system problems -infection with the human immunodeficiency virus (HIV) or AIDS -low blood platelet counts -multiple sclerosis -an unusual or allergic reaction to influenza virus  vaccine, eggs, chicken proteins, latex, gentamicin, other medicines, foods, dyes or preservatives -pregnant or trying to get pregnant -breast-feeding How should I use this medicine? This vaccine is for injection into a muscle. It is given by a health care professional. A copy of Vaccine Information Statements will be given before each vaccination. Read this sheet carefully each time. The sheet may change frequently. Talk to your pediatrician regarding the use of this medicine in children. Special care may be needed. Overdosage: If you think you have taken too much of this medicine contact a poison control center or emergency room at once. NOTE: This medicine is only for you. Do not share this medicine with others. What if I miss a dose? This does not apply. What may interact with this medicine? -chemotherapy or radiation therapy -medicines that lower your immune system like etanercept, anakinra, infliximab, and adalimumab -medicines that treat or prevent blood clots like warfarin -phenytoin -steroid medicines like prednisone or cortisone -theophylline -vaccines This list may not describe all possible interactions. Give your health care provider a list of all the medicines, herbs, non-prescription drugs, or dietary supplements you use. Also tell them if you smoke, drink alcohol, or use illegal drugs. Some items may interact with your medicine. What should I watch for while using this medicine? Report any side effects that do not go away within 3 days to your doctor or health care professional. Call your health care provider if any unusual symptoms occur within 6 weeks of receiving this vaccine. You may still catch the flu, but the  illness is not usually as bad. You cannot get the flu from the vaccine. The vaccine will not protect against colds or other illnesses that may cause fever. The vaccine is needed every year. What side effects may I notice from receiving this medicine? Side effects  that you should report to your doctor or health care professional as soon as possible: -allergic reactions like skin rash, itching or hives, swelling of the face, lips, or tongue Side effects that usually do not require medical attention (report to your doctor or health care professional if they continue or are bothersome): -fever -headache -muscle aches and pains -pain, tenderness, redness, or swelling at site where injected -weak or tired This list may not describe all possible side effects. Call your doctor for medical advice about side effects. You may report side effects to FDA at 1-800-FDA-1088. Where should I keep my medicine? This vaccine is only given in a clinic, pharmacy, doctor's office, or other health care setting and will not be stored at home. NOTE: This sheet is a summary. It may not cover all possible information. If you have questions about this medicine, talk to your doctor, pharmacist, or health care provider.  2018 Elsevier/Gold Standard (2007-10-27 09:30:40)

## 2017-12-31 NOTE — Progress Notes (Signed)
Patient ID: Juan Kline, male    DOB: 12/12/1970  MRN: [id number]  CC: Hypertension   Subjective: Juan Kline is a 47 y.o. male who presents for chronic disease management His concerns today include:  HTN, HL, anxiety, chronic fatigue, severe OSA  Patient was seen by neurology Dr. Delice Lesch.  EEG and MRI were negative. Plan is for 2 days ambulatory EEG next wk  OSA:  Will be getting a refurbish CPAP.  HTN: compliant with blood pressure medications that include amlodipine, losartan and HCTZ. No LE edema, CP.  SOB only when "I have one of these spells." Not getting in any exercise.  Gained 13 lbs in past 6 wks.   Eating habits:  "Bad."  Admits that he over eats and often eats dinner 9-10 at nights.   Anxiety: tolerating current dose of Zoloft.  "I haven't had a bad episode in about 1 mth."    Patient Active Problem List   Diagnosis Date Noted  . OSA (obstructive sleep apnea) 10/26/2017  . Anxiety disorder 10/26/2017  . Peripheral neuropathy 12/12/2016  . Anxiety state 05/11/2015  . Former smoker 05/11/2015  . Prediabetes 11/27/2014  . Other hyperlipidemia 05/22/2014  . Vitamin D insufficiency 05/22/2014  . Numbness and tingling of foot 05/19/2014  . Venous stasis dermatitis of both lower extremities 05/19/2014  . history of Hypertension 03/02/2014  . Morbid obesity ([place]) 03/02/2014     Current Outpatient Medications on File Prior to Visit  Medication Sig Dispense Refill  . amLODipine (NORVASC) 10 MG tablet Take 1 tablet (10 mg total) by mouth daily. 30 tablet 6  . hydrochlorothiazide (HYDRODIURIL) 12.5 MG tablet Take 1 tablet (12.5 mg total) by mouth daily. 90 tablet 0  . losartan (COZAAR) 100 MG tablet Take 1 tablet (100 mg total) by mouth daily. 90 tablet 0  . sertraline (ZOLOFT) 50 MG tablet Take 1 tablet (50 mg total) by mouth daily. 30 tablet 5   No current facility-administered medications on file prior to visit.     Allergies  Allergen Reactions  . Lyrica  [Pregabalin] Anxiety    Social History   Socioeconomic History  . Marital status: Single    Spouse name: Not on file  . Number of children: Not on file  . Years of education: Not on file  . Highest education level: Not on file  Occupational History  . Not on file  Social Needs  . Financial resource strain: Not on file  . Food insecurity:    Worry: Not on file    Inability: Not on file  . Transportation needs:    Medical: Not on file    Non-medical: Not on file  Tobacco Use  . Smoking status: Current Every Day Smoker    Packs/day: 0.10    Years: 26.00    Pack years: 2.60    Types: E-cigarettes    Last attempt to quit: 02/16/2014    Years since quitting: 3.8  . Smokeless tobacco: Never Used  Substance and Sexual Activity  . Alcohol use: Yes    Comment: 03/02/2014 "I rarely drink"  . Drug use: No  . Sexual activity: Not on file  Lifestyle  . Physical activity:    Days per week: Not on file    Minutes per session: Not on file  . Stress: Not on file  Relationships  . Social connections:    Talks on phone: Not on file    Gets together: Not on file    Attends religious  service: Not on file    Active member of club or organization: Not on file    Attends meetings of clubs or organizations: Not on file    Relationship status: Not on file  . Intimate partner violence:    Fear of current or ex partner: Not on file    Emotionally abused: Not on file    Physically abused: Not on file    Forced sexual activity: Not on file  Other Topics Concern  . Not on file  Social History Narrative   Pt lives in 1 story home with his mother and step father   9th grade education   Last occupation was with [employer] in 2009    Family History  Problem Relation Age of Onset  . Hypertension Mother   . Hyperlipidemia Mother   . Diabetes Mother   . Kidney disease Mother   . Hypertension Father   . Hyperlipidemia Father   . Diabetes Brother   . Heart disease Maternal Grandmother   .  Diabetes Maternal Grandfather   . Heart attack Paternal Grandmother   . Heart disease Paternal Grandfather     Past Surgical History:  Procedure Laterality Date  . NO PAST SURGERIES      ROS: Review of Systems Neg except as above  PHYSICAL EXAM: BP 122/80   Pulse 96   Temp (!) 97.5 F (36.4 C) (Oral)   Resp 16   Wt (!) 308 lb 12.8 oz (140.1 kg)   SpO2 95%   BMI 45.60 kg/m   Wt Readings from Last 3 Encounters:  12/31/17 (!) 308 lb 12.8 oz (140.1 kg)  11/15/17 295 lb (133.8 kg)  11/12/17 295 lb (133.8 kg)    Physical Exam General appearance - alert, well appearing, and in no distress Mental status - normal mood, behavior, speech, dress, motor activity, and thought processes Neck - supple, no significant adenopathy Chest - clear to auscultation, no wheezes, rales or rhonchi, symmetric air entry Heart - normal rate, regular rhythm, normal S1, S2, no murmurs, rubs, clicks or gallops Extremities - peripheral pulses normal, no pedal edema, no clubbing or cyanosis  GAD 7 : Generalized Anxiety Score 12/31/2017 10/26/2017 09/15/2017 08/13/2017  Nervous, Anxious, on Edge 0 0 1 1  Control/stop worrying 0 0 1 1  Worry too much - different things 1 0 1 1  Trouble relaxing 0 0 1 0  Restless 0 0 0 0  Easily annoyed or irritable 1 1 1 1   Afraid - awful might happen 0 0 0 0  Total GAD 7 Score 2 1 5 4     Depression screen [facility] 2/9 12/31/2017 10/26/2017 09/15/2017  Decreased Interest 1 0 1  Down, Depressed, Hopeless 1 1 1   PHQ - 2 Score 2 1 2   Altered sleeping 2 2 1   Tired, decreased energy 2 2 1   Change in appetite 2 2 1   Feeling bad or failure about yourself  1 0 0  Trouble concentrating 1 1 1   Moving slowly or fidgety/restless 0 0 0  Suicidal thoughts 0 0 0  PHQ-9 Score 10 8 6   Some recent data might be hidden    ASSESSMENT AND PLAN: 1. Essential hypertension At goal.  Continue current medications and low-salt diet.  2. OSA (obstructive sleep apnea) Message sent to caseworker  inquiring about the CPAP machine that was supposed to be shipped out the end of last month  3. Anxiety disorder, unspecified type Patient feels that the he is adequately controlled  at this time and does not require an increased dose of Zoloft.  4. Class 3 severe obesity due to excess calories without serious comorbidity with body mass index (BMI) of 45.0 to 49.9 in adult [facility]) Dietary counseling done.  Patient has set goal to decrease portion sizes and to eat his supper before 7 PM.  I have also encouraged him to try to get in some form of exercise at least 3 times a week if only for 15 minutes for starters.  He will try to do so.  5. Need for immunization against influenza - Flu Vaccine QUAD 36+ mos IM   Patient was given the opportunity to ask questions.  Patient verbalized understanding of the plan and was able to repeat key elements of the plan.   Orders Placed This Encounter  Procedures  . Flu Vaccine QUAD 36+ mos IM     Requested Prescriptions    No prescriptions requested or ordered in this encounter    Return in about 3 months (around 04/01/2018).  Karle Plumber, MD, FACP

## 2018-01-01 ENCOUNTER — Ambulatory Visit: Payer: Self-pay | Admitting: Neurology

## 2018-01-04 ENCOUNTER — Ambulatory Visit (INDEPENDENT_AMBULATORY_CARE_PROVIDER_SITE_OTHER): Payer: Self-pay | Admitting: Neurology

## 2018-01-04 DIAGNOSIS — R29818 Other symptoms and signs involving the nervous system: Secondary | ICD-10-CM

## 2018-01-10 NOTE — Procedures (Signed)
ELECTROENCEPHALOGRAM REPORT  Dates of Recording: 01/04/2018 9:54AM to 01/06/2018 10:17AM  Patient's Name: Juan Kline MRN: [id number] Date of Birth: 07-24-1970  Referring Provider: Dr. Ellouise Newer  Procedure: 48-hour ambulatory EEG  History: This is a 47 year old man with recurrent spells where he has a strange sensation in the head, swimmy headed, with sensation spreading to his entire body. EEG for classification.  Medications: NORVASC 10 MG tablet         HYDRODIURIL 12.5 MG tablet           COZAAR 100 MG tablet ZOLOFT 50 MG tablet  Technical Summary: This is a 48-hour multichannel digital EEG recording measured by the international 10-20 system with electrodes applied with paste and impedances below 5000 ohms performed as portable with EKG monitoring.  The digital EEG was referentially recorded, reformatted, and digitally filtered in a variety of bipolar and referential montages for optimal display.    DESCRIPTION OF RECORDING: During maximal wakefulness, the background activity consisted of a symmetric 10 Hz posterior dominant rhythm which was reactive to eye opening.  There were no epileptiform discharges or focal slowing seen in wakefulness.  During the recording, the patient progresses through wakefulness, drowsiness, and Stage 2 sleep.  Again, there were no epileptiform discharges seen.  Events: There were no push button events. Patient did not complete diary.  There were no electrographic seizures seen.  EKG lead was unremarkable.  IMPRESSION: This 48-hour ambulatory EEG study is normal.    CLINICAL CORRELATION: A normal EEG does not exclude a clinical diagnosis of epilepsy. Typical events were not captured. If further clinical questions remain, inpatient video EEG monitoring may be helpful.   Ellouise Newer, M.D.

## 2018-01-11 ENCOUNTER — Telehealth: Payer: Self-pay

## 2018-01-11 NOTE — Telephone Encounter (Signed)
Spoke with pt relaying message below.  He states that he is doing well. Better than he has in a while.  States that he has not had any episodes in "some time"

## 2018-01-11 NOTE — Telephone Encounter (Signed)
Call placed to [employer], spoke to [facility], who said that the patient remains on their waiting list for his BiPAP machine  They will send it out as soon as it is received.

## 2018-01-11 NOTE — Telephone Encounter (Signed)
ERROR

## 2018-01-11 NOTE — Telephone Encounter (Signed)
-----   Message from Cameron Sprang, MD sent at 01/11/2018  9:48 AM EDT ----- Pls let him know the 48-hour brain wave test was normal, normal brain waves, no evidence of seizure activity. How is he feeling, is he still having a lot of the episodes? thanks

## 2018-01-19 MED FILL — AMLODIPINE BESYLATE 10 MG T: 10 | 30 days supply | Qty: 30 | Fill #4

## 2018-01-19 MED FILL — SERTRALINE HCL 50 MG TABS: 50 | 30 days supply | Qty: 30 | Fill #2

## 2018-01-19 MED FILL — HYDROCHLOROTHIAZIDE 12.5 MG: 12.5 | 30 days supply | Qty: 30 | Fill #0

## 2018-01-19 MED FILL — LOSARTAN POTASSIUM 100 MG T: 100 | 30 days supply | Qty: 30 | Fill #2

## 2018-01-27 ENCOUNTER — Telehealth: Payer: Self-pay

## 2018-01-27 NOTE — Telephone Encounter (Signed)
As per Alice with the [employer] - CPAP assistance program, they continue to have a wait list for BiPAP machines.

## 2018-02-03 ENCOUNTER — Telehealth: Payer: Self-pay

## 2018-02-03 NOTE — Telephone Encounter (Signed)
BiPAP arrived. Call placed to [facility] Res[facility] Therapy Dept and teaching session scheduled for 02/04/18 @ 1130.  Call placed to the patient and informed him of the teaching session scheduled for tomorrow at [facility].  He said that he would be there.

## 2018-02-04 ENCOUNTER — Telehealth: Payer: Self-pay

## 2018-02-04 NOTE — Telephone Encounter (Signed)
The patient met with Phillis Knack, RT/[place] for BiPAP teaching. He was instructed regarding the use/care of the machine and was given the machine at the end of the teaching session.

## 2018-02-17 ENCOUNTER — Other Ambulatory Visit: Payer: Self-pay | Admitting: Physician Assistant

## 2018-02-17 DIAGNOSIS — I10 Essential (primary) hypertension: Secondary | ICD-10-CM

## 2018-02-17 MED FILL — SERTRALINE HCL 50 MG TABS: 50 | 30 days supply | Qty: 30 | Fill #3

## 2018-02-17 MED FILL — LOSARTAN POTASSIUM 100 MG T: 100 | 30 days supply | Qty: 30 | Fill #0

## 2018-02-17 MED FILL — HYDROCHLOROTHIAZIDE 12.5 MG: 12.5 | 30 days supply | Qty: 30 | Fill #1

## 2018-02-17 MED FILL — AMLODIPINE BESYLATE 10 MG T: 10 | 30 days supply | Qty: 30 | Fill #5

## 2018-03-23 MED FILL — LOSARTAN POTASSIUM 100 MG T: 100 | 30 days supply | Qty: 30 | Fill #1

## 2018-03-23 MED FILL — AMLODIPINE BESYLATE 10 MG T: 10 | 30 days supply | Qty: 30 | Fill #6

## 2018-03-23 MED FILL — SERTRALINE HCL 50 MG TABS: 50 | 30 days supply | Qty: 30 | Fill #4

## 2018-03-23 MED FILL — HYDROCHLOROTHIAZIDE 12.5 MG: 12.5 | 30 days supply | Qty: 30 | Fill #2

## 2018-04-01 ENCOUNTER — Ambulatory Visit: Payer: Self-pay | Attending: Internal Medicine | Admitting: Internal Medicine

## 2018-04-01 ENCOUNTER — Ambulatory Visit: Payer: Self-pay | Attending: Family Medicine | Admitting: Licensed Clinical Social Worker

## 2018-04-01 ENCOUNTER — Encounter: Payer: Self-pay | Admitting: Internal Medicine

## 2018-04-01 VITALS — BP 132/77 | HR 98 | Temp 98.0°F | Resp 16 | Wt 324.4 lb

## 2018-04-01 DIAGNOSIS — R5383 Other fatigue: Secondary | ICD-10-CM | POA: Insufficient documentation

## 2018-04-01 DIAGNOSIS — F331 Major depressive disorder, recurrent, moderate: Secondary | ICD-10-CM

## 2018-04-01 DIAGNOSIS — Z6841 Body Mass Index (BMI) 40.0 and over, adult: Secondary | ICD-10-CM | POA: Insufficient documentation

## 2018-04-01 DIAGNOSIS — Z8249 Family history of ischemic heart disease and other diseases of the circulatory system: Secondary | ICD-10-CM | POA: Insufficient documentation

## 2018-04-01 DIAGNOSIS — Z599 Problem related to housing and economic circumstances, unspecified: Secondary | ICD-10-CM

## 2018-04-01 DIAGNOSIS — E785 Hyperlipidemia, unspecified: Secondary | ICD-10-CM | POA: Insufficient documentation

## 2018-04-01 DIAGNOSIS — Z598 Other problems related to housing and economic circumstances: Secondary | ICD-10-CM

## 2018-04-01 DIAGNOSIS — R7303 Prediabetes: Secondary | ICD-10-CM | POA: Insufficient documentation

## 2018-04-01 DIAGNOSIS — I10 Essential (primary) hypertension: Secondary | ICD-10-CM | POA: Insufficient documentation

## 2018-04-01 DIAGNOSIS — F32A Depression, unspecified: Secondary | ICD-10-CM | POA: Insufficient documentation

## 2018-04-01 DIAGNOSIS — G629 Polyneuropathy, unspecified: Secondary | ICD-10-CM | POA: Insufficient documentation

## 2018-04-01 DIAGNOSIS — F329 Major depressive disorder, single episode, unspecified: Secondary | ICD-10-CM | POA: Insufficient documentation

## 2018-04-01 DIAGNOSIS — F17290 Nicotine dependence, other tobacco product, uncomplicated: Secondary | ICD-10-CM | POA: Insufficient documentation

## 2018-04-01 DIAGNOSIS — Z888 Allergy status to other drugs, medicaments and biological substances status: Secondary | ICD-10-CM | POA: Insufficient documentation

## 2018-04-01 DIAGNOSIS — F419 Anxiety disorder, unspecified: Secondary | ICD-10-CM | POA: Insufficient documentation

## 2018-04-01 DIAGNOSIS — E559 Vitamin D deficiency, unspecified: Secondary | ICD-10-CM | POA: Insufficient documentation

## 2018-04-01 DIAGNOSIS — Z59819 Housing instability, housed unspecified: Secondary | ICD-10-CM

## 2018-04-01 DIAGNOSIS — Z79899 Other long term (current) drug therapy: Secondary | ICD-10-CM | POA: Insufficient documentation

## 2018-04-01 DIAGNOSIS — F320 Major depressive disorder, single episode, mild: Secondary | ICD-10-CM

## 2018-04-01 DIAGNOSIS — G4733 Obstructive sleep apnea (adult) (pediatric): Secondary | ICD-10-CM | POA: Insufficient documentation

## 2018-04-01 MED ORDER — SERTRALINE HCL 100 MG PO TABS: 100.0000 mg | ORAL_TABLET | Freq: Every day | ORAL | 6 refills | Status: DC

## 2018-04-01 NOTE — Progress Notes (Signed)
Patient ID: Juan Kline, male    DOB: August 06, 1970  MRN: [id number]  CC: Hypertension   Subjective: Juan Kline is a 47 y.o. male who presents for for chronic disease management. His concerns today include:  HTN, HL, anxiety, chronic fatigue, severe OSA  OSA:  Did get refurbish CPAP.  He is using his CPAP every night but states he can tolerate it only for about 2 hours at a time.  He has 2 mask that are the same size but he has had a hard time getting the mass to stay on his face at night and he can feel leakage of air from the side of the mass.  He thinks the mass is too small.  He had the 48 hr EEG done since last visit.  This was normal.  Thinks the symptoms he was having in the past that led to frequent ER visits were anxiety related.  He reports he has not had any major spells but has had a few small ones intermittently.  He is on Zoloft.   Depress because he is about to be homeless.  Lives with mother and stepfather.  They want him to find a job before the end of the month as a requested to continue staying with them.  He hoped that his mother would have been more understanding with the medical issues that he has been going through over the past 1-1/2 years.  This has created depression and feeling of pressure for him.  He wants to work but feels he may not be able to.  He states that he has been looking for a job but feels he is not a good candidate because he is middle-age, overweight with some health issues.  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  He wonders about whether he needs to apply for disability. He was going through health issue past 1.5 yrs.    Of note he has gained 30 pounds since August.  He admits that his eating habits are poor and he tends to overeat.    HTN: Reports compliance with amlodipine and Cozaar.  Patient Active Problem List   Diagnosis  Date Noted  . OSA (obstructive sleep apnea) 10/26/2017  . Anxiety disorder 10/26/2017  . Peripheral neuropathy 12/12/2016  . Anxiety state 05/11/2015  . Former smoker 05/11/2015  . Prediabetes 11/27/2014  . Other hyperlipidemia 05/22/2014  . Vitamin D insufficiency 05/22/2014  . Numbness and tingling of foot 05/19/2014  . Venous stasis dermatitis of both lower extremities 05/19/2014  . history of Hypertension 03/02/2014  . Morbid obesity ([place]) 03/02/2014     Current Outpatient Medications on File Prior to Visit  Medication Sig Dispense Refill  . amLODipine (NORVASC) 10 MG tablet Take 1 tablet (10 mg total) by mouth daily. 30 tablet 6  . hydrochlorothiazide (HYDRODIURIL) 12.5 MG tablet Take 1 tablet (12.5 mg total) by mouth daily. 90 tablet 0  . losartan (COZAAR) 100 MG tablet TAKE 1 TABLET BY MOUTH DAILY. 30 tablet 2  . sertraline (ZOLOFT) 50 MG tablet Take 1 tablet (50 mg total) by mouth daily. 30 tablet 5   No current facility-administered medications on file prior to visit.     Allergies  Allergen Reactions  . Lyrica [Pregabalin] Anxiety    Social History   Socioeconomic History  . Marital status: Single  Spouse name: Not on file  . Number of children: Not on file  . Years of education: Not on file  . Highest education level: Not on file  Occupational History  . Not on file  Social Needs  . Financial resource strain: Not on file  . Food insecurity:    Worry: Not on file    Inability: Not on file  . Transportation needs:    Medical: Not on file    Non-medical: Not on file  Tobacco Use  . Smoking status: Current Every Day Smoker    Packs/day: 0.10    Years: 26.00    Pack years: 2.60    Types: E-cigarettes    Last attempt to quit: 02/16/2014    Years since quitting: 4.1  . Smokeless tobacco: Never Used  Substance and Sexual Activity  . Alcohol use: Yes    Comment: 03/02/2014 "I rarely drink"  . Drug use: No  . Sexual activity: Not on file  Lifestyle  .  Physical activity:    Days per week: Not on file    Minutes per session: Not on file  . Stress: Not on file  Relationships  . Social connections:    Talks on phone: Not on file    Gets together: Not on file    Attends religious service: Not on file    Active member of club or organization: Not on file    Attends meetings of clubs or organizations: Not on file    Relationship status: Not on file  . Intimate partner violence:    Fear of current or ex partner: Not on file    Emotionally abused: Not on file    Physically abused: Not on file    Forced sexual activity: Not on file  Other Topics Concern  . Not on file  Social History Narrative   Pt lives in 1 story home with his mother and step father   9th grade education   Last occupation was with [employer] in 2009    Family History  Problem Relation Age of Onset  . Hypertension Mother   . Hyperlipidemia Mother   . Diabetes Mother   . Kidney disease Mother   . Hypertension Father   . Hyperlipidemia Father   . Diabetes Brother   . Heart disease Maternal Grandmother   . Diabetes Maternal Grandfather   . Heart attack Paternal Grandmother   . Heart disease Paternal Grandfather     Past Surgical History:  Procedure Laterality Date  . NO PAST SURGERIES      ROS: Review of Systems Negative except as above PHYSICAL EXAM: BP 132/77   Pulse 98   Temp 98 F (36.7 C) (Oral)   Resp 16   Wt (!) 324 lb 6.4 oz (147.1 kg)   SpO2 96%   BMI 47.91 kg/m   Wt Readings from Last 3 Encounters:  04/01/18 (!) 324 lb 6.4 oz (147.1 kg)  12/31/17 (!) 308 lb 12.8 oz (140.1 kg)  11/15/17 295 lb (133.8 kg)   Physical Exam  General appearance - alert, well appearing, and in no distress Mental status - normal mood, behavior, speech, dress, motor activity, and thought processes Neck - supple, no significant adenopathy Chest - clear to auscultation, no wheezes, rales or rhonchi, symmetric air entry Heart - normal rate, regular rhythm,  normal S1, S2, no murmurs, rubs, clicks or gallops Extremities -trace lower extremity edema.  Moderate torturous varicose veins with mild stasis changes  Depression screen [facility] 2/9  04/01/2018 12/31/2017 10/26/2017  Decreased Interest 2 1 0  Down, Depressed, Hopeless 3 1 1   PHQ - 2 Score 5 2 1   Altered sleeping 3 2 2   Tired, decreased energy 3 2 2   Change in appetite 3 2 2   Feeling bad or failure about yourself  3 1 0  Trouble concentrating 2 1 1   Moving slowly or fidgety/restless 0 0 0  Suicidal thoughts 1 0 0  PHQ-9 Score 20 10 8   Some recent data might be hidden    ASSESSMENT AND PLAN: 1. OSA (obstructive sleep apnea) We will send him back to the sleep lab for them to try him with a different size mask or nasal mask to see if he tolerates better - Desensitization mask fit; Future  2. Class 3 severe obesity due to excess calories without serious comorbidity with body mass index (BMI) of 45.0 to 49.9 in adult [facility]) Healthy eating habits discussed.  Encourage him to cut back on portion sizes, sugary drinks and white carbohydrates.  Printed information given. Encouraged him to try to get out of the house and walk at least 3 days a week for 20 to 30 minutes. - Hemoglobin A1c  3. Anxiety disorder, unspecified type 4. Mild depression ([place]) I do not think that he is disable.  He does have some health challenges with we have been addressing them systematically.  He has had a setback mentally due to the pressure being placed on him to find a job before the end of this year otherwise he will be put out of the house.  I have recommended speaking with our LCSW about getting him into vocational rehab program to help with job placement. Discussed whether he feels he is on adequate dose of the Zoloft.  Patient feels he would benefit from an increased dose.  We increase the dose from 50 to 100 mg. - sertraline (ZOLOFT) 100 MG tablet; Take 1 tablet (100 mg total) by mouth daily.  Dispense: 30 tablet;  Refill: 6  5. Essential hypertension Close to goal.  Continue current medications   Patient was given the opportunity to ask questions.  Patient verbalized understanding of the plan and was able to repeat key elements of the plan.   No orders of the defined types were placed in this encounter.    Requested Prescriptions    No prescriptions requested or ordered in this encounter    No follow-ups on file.  Karle Plumber, MD, FACP

## 2018-04-01 NOTE — Patient Instructions (Signed)
Increase Zoloft to 100 mg daily.  Try to get out and walk at least 3 days a week for 30 minutes.  Follow a Healthy Eating Plan - You can do it! Limit sugary drinks.  Avoid sodas, sweet tea, sport or energy drinks, or fruit drinks.  Drink water, lo-fat milk, or diet drinks. Limit snack foods.   Cut back on candy, cake, cookies, chips, ice cream.  These are a special treat, only in small amounts. Eat plenty of vegetables.  Especially dark green, red, and orange vegetables. Aim for at least 3 servings a day. More is better! Include fruit in your daily diet.  Whole fruit is much healthier than fruit juice! Limit "white" bread, "white" pasta, "white" rice.   Choose "100% whole grain" products, brown or wild rice. Avoid fatty meats. Try "Meatless Monday" and choose eggs or beans one day a week.  When eating meat, choose lean meats like chicken, [place], and fish.  Grill, broil, or bake meats instead of frying, and eat poultry without the skin. Eat less salt.  Avoid frozen pizzas, frozen dinners and salty foods.  Use seasonings other than salt in cooking.  This can help blood pressure and keep you from swelling Beer, wine and liquor have calories.  If you can safely drink alcohol, limit to 1 drink per day for women, 2 drinks for men

## 2018-04-02 LAB — HEMOGLOBIN A1C
Est. average glucose Bld gHb Est-mCnc: 192 mg/dL
Hgb A1c MFr Bld: 8.3 % — ABNORMAL HIGH (ref 4.8–5.6)

## 2018-04-05 NOTE — BH Specialist Note (Signed)
Integrated Behavioral Health Initial Visit  MRN: [id number] Name: Juan Kline  Number of [place] Clinician visits:: 1/6 Session Start time: 11:25 PM  Session End time: 11:45 PM Total time: 20 minutes  Type of Service: [place] Interpretor:No. Interpretor Name and Language: N/A   Warm Hand Off Completed.       SUBJECTIVE: Juan Kline is a 47 y.o. male accompanied by self Patient was referred by Dr. [name] for depression and anxiety. Patient reports the following symptoms/concerns: Pt has been given a timeframe to obtain employment or else he will be kicked out of the home. Pt has difficulty managing ongoing medical conditions and is experiencing financial strain Duration of problem: Ongoing; Severity of problem: severe  OBJECTIVE: Mood: Anxious and Depressed and Affect: Depressed Risk of harm to self or others: No plan to harm self or others Pt scored positive on phq9; however, denies current SI.HI/AVH Safety plan discussed and crisis intervention resources provided  LIFE CONTEXT: Family and Social: Pt resides with mother and stepfather School/Work: Pt is unemployed and does not receive public benefits Self-Care: Pt vapes to cope with stressors Life Changes: Pt has been given a timeframe to obtain employment or else he will be kicked out of the home. Pt has difficulty managing ongoing medical conditions and is experiencing financial strain  GOALS ADDRESSED: Patient will: 1. Reduce symptoms of: anxiety, depression and stress 2. Increase knowledge and/or ability of: coping skills and healthy habits  3. Demonstrate ability to: Increase healthy adjustment to current life circumstances and Increase adequate support systems for patient/family  INTERVENTIONS: Interventions utilized: Solution-Focused Strategies, Supportive Counseling, Psychoeducation and/or Health Education and Link to [employer]  Standardized  Assessments completed: GAD-7 and PHQ 2&9 with C-SSRS  ASSESSMENT: Patient currently experiencing depression and anxiety triggered by financial strain and difficulty managing ongoing medical conditions. Pt has been given a timeframe to obtain employment or else he will be kicked out of the home. Pt receives limited support. He scored positive on phq9; however, denies current SI/HI/AVH. Safety plan discussed and crisis intervention resources provided.    Patient may benefit from psychotherapy. He participates in medication management through PCP. Therapeutic interventions were discussed to decrease and/or manage symptoms. Supportive resources provided to assist with obtaining employment ([employer]oc Rehab) Pt was strongly encouraged to complete financial counseling to assist with financial strain  PLAN: 1. Follow up with behavioral health clinician on : Pt was encouraged to contact LCSWA if symptoms worsen or fail to improve to schedule behavioral appointments at [facility]. 2. Behavioral recommendations: LCSWA recommends that pt apply healthy coping skills discussed, comply with medication management, and utilize provided resources. Pt is encouraged to schedule follow up appointment with LCSWA 3. Referral(s): [facility] (In Clinic), [facility] (LME/Outside Clinic) and Community Resources:  Finances 4. "From scale of 1-10, how likely are you to follow plan?":   Rebekah Chesterfield, LCSW 04/05/18 4:56 PM

## 2018-04-12 ENCOUNTER — Telehealth: Payer: Self-pay

## 2018-04-12 MED FILL — SERTRALINE HCL 100 MG TAB: 100 | 30 days supply | Qty: 30 | Fill #0

## 2018-04-12 NOTE — Telephone Encounter (Signed)
Contacted pt to go over lab results pt is aware and have ben scheduled to come in 04/13/2018 @1050am 

## 2018-04-13 ENCOUNTER — Encounter: Payer: Self-pay | Admitting: Internal Medicine

## 2018-04-13 ENCOUNTER — Ambulatory Visit: Payer: Self-pay | Attending: Internal Medicine | Admitting: Internal Medicine

## 2018-04-13 VITALS — BP 120/78 | HR 90 | Temp 98.0°F | Resp 16 | Wt 325.8 lb

## 2018-04-13 DIAGNOSIS — F419 Anxiety disorder, unspecified: Secondary | ICD-10-CM | POA: Insufficient documentation

## 2018-04-13 DIAGNOSIS — Z886 Allergy status to analgesic agent status: Secondary | ICD-10-CM | POA: Insufficient documentation

## 2018-04-13 DIAGNOSIS — Z833 Family history of diabetes mellitus: Secondary | ICD-10-CM | POA: Insufficient documentation

## 2018-04-13 DIAGNOSIS — E1142 Type 2 diabetes mellitus with diabetic polyneuropathy: Secondary | ICD-10-CM | POA: Insufficient documentation

## 2018-04-13 DIAGNOSIS — F17210 Nicotine dependence, cigarettes, uncomplicated: Secondary | ICD-10-CM | POA: Insufficient documentation

## 2018-04-13 DIAGNOSIS — G4733 Obstructive sleep apnea (adult) (pediatric): Secondary | ICD-10-CM | POA: Insufficient documentation

## 2018-04-13 DIAGNOSIS — Z6841 Body Mass Index (BMI) 40.0 and over, adult: Secondary | ICD-10-CM | POA: Insufficient documentation

## 2018-04-13 DIAGNOSIS — E1165 Type 2 diabetes mellitus with hyperglycemia: Secondary | ICD-10-CM

## 2018-04-13 DIAGNOSIS — I10 Essential (primary) hypertension: Secondary | ICD-10-CM | POA: Insufficient documentation

## 2018-04-13 DIAGNOSIS — Z79899 Other long term (current) drug therapy: Secondary | ICD-10-CM | POA: Insufficient documentation

## 2018-04-13 DIAGNOSIS — E119 Type 2 diabetes mellitus without complications: Secondary | ICD-10-CM

## 2018-04-13 DIAGNOSIS — Z7984 Long term (current) use of oral hypoglycemic drugs: Secondary | ICD-10-CM | POA: Insufficient documentation

## 2018-04-13 DIAGNOSIS — Z8249 Family history of ischemic heart disease and other diseases of the circulatory system: Secondary | ICD-10-CM | POA: Insufficient documentation

## 2018-04-13 LAB — GLUCOSE, POCT (MANUAL RESULT ENTRY): POC GLUCOSE: 244 mg/dL — AB (ref 70–99)

## 2018-04-13 MED ORDER — TRUEPLUS LANCETS 28G MISC: 2 refills | Status: DC

## 2018-04-13 MED ORDER — METFORMIN HCL 500 MG PO TABS: 500.0000 mg | ORAL_TABLET | Freq: Two times a day (BID) | ORAL | 5 refills | Status: DC

## 2018-04-13 MED ORDER — GLUCOSE BLOOD VI STRP: ORAL_STRIP | 12 refills | Status: DC

## 2018-04-13 MED ORDER — TRUE METRIX METER W/DEVICE KIT
PACK | 0 refills | Status: DC
Start: 2018-04-13 — End: 2020-05-08

## 2018-04-13 NOTE — Progress Notes (Signed)
Patient ID: Juan Kline, male    DOB: Apr 11, 1971  MRN: [id number]  CC: Diabetes   Subjective: Juan Kline is a 47 y.o. male who presents for new dx of DM His concerns today include:  HTN, HL, anxiety, chronic fatigue, severe OSA  Pt dx with DM with A1C on last visit of 8.3.  Patient was called and asked to come in today for further discussion. Fhx in of DM in materal GF and step brother  Endorses some blurred vision, mild polydipsia.  No numbness in hands and feet On last visit we had talked about the importance of healthy eating habits and regular exercise.  He states that he started exercising a little bit and plans to gradually do more Patient Active Problem List   Diagnosis Date Noted  . Mild depression ([place]) 04/01/2018  . OSA (obstructive sleep apnea) 10/26/2017  . Anxiety disorder 10/26/2017  . Peripheral neuropathy 12/12/2016  . Anxiety state 05/11/2015  . Former smoker 05/11/2015  . Prediabetes 11/27/2014  . Other hyperlipidemia 05/22/2014  . Vitamin D insufficiency 05/22/2014  . Numbness and tingling of foot 05/19/2014  . Venous stasis dermatitis of both lower extremities 05/19/2014  . history of Hypertension 03/02/2014  . Morbid obesity ([place]) 03/02/2014     Current Outpatient Medications on File Prior to Visit  Medication Sig Dispense Refill  . amLODipine (NORVASC) 10 MG tablet Take 1 tablet (10 mg total) by mouth daily. 30 tablet 6  . hydrochlorothiazide (HYDRODIURIL) 12.5 MG tablet Take 1 tablet (12.5 mg total) by mouth daily. 90 tablet 0  . losartan (COZAAR) 100 MG tablet TAKE 1 TABLET BY MOUTH DAILY. 30 tablet 2  . sertraline (ZOLOFT) 100 MG tablet Take 1 tablet (100 mg total) by mouth daily. 30 tablet 6   No current facility-administered medications on file prior to visit.     Allergies  Allergen Reactions  . Lyrica [Pregabalin] Anxiety    Social History   Socioeconomic History  . Marital status: Single    Spouse name: Not on file  . Number of  children: Not on file  . Years of education: Not on file  . Highest education level: Not on file  Occupational History  . Not on file  Social Needs  . Financial resource strain: Not on file  . Food insecurity:    Worry: Not on file    Inability: Not on file  . Transportation needs:    Medical: Not on file    Non-medical: Not on file  Tobacco Use  . Smoking status: Current Every Day Smoker    Packs/day: 0.10    Years: 26.00    Pack years: 2.60    Types: E-cigarettes    Last attempt to quit: 02/16/2014    Years since quitting: 4.1  . Smokeless tobacco: Never Used  Substance and Sexual Activity  . Alcohol use: Yes    Comment: 03/02/2014 "I rarely drink"  . Drug use: No  . Sexual activity: Not on file  Lifestyle  . Physical activity:    Days per week: Not on file    Minutes per session: Not on file  . Stress: Not on file  Relationships  . Social connections:    Talks on phone: Not on file    Gets together: Not on file    Attends religious service: Not on file    Active member of club or organization: Not on file    Attends meetings of clubs or organizations: Not on file  Relationship status: Not on file  . Intimate partner violence:    Fear of current or ex partner: Not on file    Emotionally abused: Not on file    Physically abused: Not on file    Forced sexual activity: Not on file  Other Topics Concern  . Not on file  Social History Narrative   Pt lives in 1 story home with his mother and step father   9th grade education   Last occupation was with [employer] in 2009    Family History  Problem Relation Age of Onset  . Hypertension Mother   . Hyperlipidemia Mother   . Diabetes Mother   . Kidney disease Mother   . Hypertension Father   . Hyperlipidemia Father   . Diabetes Brother   . Heart disease Maternal Grandmother   . Diabetes Maternal Grandfather   . Heart attack Paternal Grandmother   . Heart disease Paternal Grandfather     Past Surgical History:   Procedure Laterality Date  . NO PAST SURGERIES      ROS: Review of Systems Negative except as above PHYSICAL EXAM: BP 120/78   Pulse 90   Temp 98 F (36.7 C) (Oral)   Resp 16   Wt (!) 325 lb 12.8 oz (147.8 kg)   SpO2 95%   BMI 48.11 kg/m   Wt Readings from Last 3 Encounters:  04/13/18 (!) 325 lb 12.8 oz (147.8 kg)  04/01/18 (!) 324 lb 6.4 oz (147.1 kg)  12/31/17 (!) 308 lb 12.8 oz (140.1 kg)    Physical Exam  General appearance - alert, well appearing, and in no distress Mental status - normal mood, behavior, speech, dress, motor activity, and thought processes  Results for orders placed or performed in visit on 04/13/18  POCT glucose (manual entry)  Result Value Ref Range   POC Glucose 244 (A) 70 - 99 mg/dl   Lab Results  Component Value Date   HGBA1C 8.3 (H) 04/01/2018     ASSESSMENT AND PLAN: 1. New onset type 2 diabetes mellitus ([place]) I spent some time doing teaching to include what his diabetes, manifestations, complications and treatment. Discussed the importance of healthy eating habits, regular aerobic exercise (at least 150 minutes a week as tolerated) and medication compliance to achieve or maintain control of diabetes.  He is agreeable to seeing the nutritionist -Went over goal blood sugar of 80-130 before meals and less than 180 hours after meals.  Patient agreeable to getting glucometer.  Encouraged him to check blood sugars at least once a day alternating before breakfast and before dinner.  He will bring in readings for next visit. Went over signs and symptoms of hypoglycemia and how to treat. Discussed the importance of having a regular eye exam at least once a year.  Patient is currently uninsured - POCT glucose (manual entry) - metFORMIN (GLUCOPHAGE) 500 MG tablet; Take 1 tablet (500 mg total) by mouth 2 (two) times daily with a meal.  Dispense: 60 tablet; Refill: 5 - Amb ref to [facility] - Blood Glucose Monitoring Suppl (TRUE  METRIX METER) w/Device KIT; Use as directed  Dispense: 1 kit; Refill: 0 - glucose blood (TRUE METRIX BLOOD GLUCOSE TEST) test strip; Use as instructed  Dispense: 100 each; Refill: 12 - TRUEPLUS LANCETS 28G MISC; Use as directed  Dispense: 100 each; Refill: 2  2. Class 3 severe obesity due to excess calories without serious comorbidity with body mass index (BMI) of 45.0 to 49.9 in adult (  [place]) See #1 above - Amb ref to [facility]   Patient was given the opportunity to ask questions.  Patient verbalized understanding of the plan and was able to repeat key elements of the plan.   No orders of the defined types were placed in this encounter.    Requested Prescriptions    No prescriptions requested or ordered in this encounter    No follow-ups on file.  Karle Plumber, MD, FACP

## 2018-04-13 NOTE — Progress Notes (Signed)
[id number]

## 2018-04-13 NOTE — Patient Instructions (Signed)
Diabetes Basics    Diabetes (diabetes mellitus) is a long-term (chronic) disease. It occurs when the body does not properly use sugar (glucose) that is released from food after you eat.  Diabetes may be caused by one or both of these problems:  · Your pancreas does not make enough of a hormone called insulin.  · Your body does not react in a normal way to insulin that it makes.  Insulin lets sugars (glucose) go into cells in your body. This gives you energy. If you have diabetes, sugars cannot get into cells. This causes high blood sugar (hyperglycemia).  Follow these instructions at home:  How is diabetes treated?  You may need to take insulin or other diabetes medicines daily to keep your blood sugar in balance. Take your diabetes medicines every day as told by your doctor. List your diabetes medicines here:  Diabetes medicines  · Name of medicine: ______________________________  ? Amount (dose): _______________ Time (a.m./p.m.): _______________ Notes: ___________________________________  · Name of medicine: ______________________________  ? Amount (dose): _______________ Time (a.m./p.m.): _______________ Notes: ___________________________________  · Name of medicine: ______________________________  ? Amount (dose): _______________ Time (a.m./p.m.): _______________ Notes: ___________________________________  If you use insulin, you will learn how to give yourself insulin by injection. You may need to adjust the amount based on the food that you eat. List the types of insulin you use here:  Insulin  · Insulin type: ______________________________  ? Amount (dose): _______________ Time (a.m./p.m.): _______________ Notes: ___________________________________  · Insulin type: ______________________________  ? Amount (dose): _______________ Time (a.m./p.m.): _______________ Notes: ___________________________________  · Insulin type: ______________________________  ? Amount (dose): _______________ Time (a.m./p.m.):  _______________ Notes: ___________________________________  · Insulin type: ______________________________  ? Amount (dose): _______________ Time (a.m./p.m.): _______________ Notes: ___________________________________  · Insulin type: ______________________________  ? Amount (dose): _______________ Time (a.m./p.m.): _______________ Notes: ___________________________________  How do I manage my blood sugar?    Check your blood sugar levels using a blood glucose monitor as directed by your doctor.  Your doctor will set treatment goals for you. Generally, you should have these blood sugar levels:  · Before meals (preprandial): 80-130 mg/dL (4.4-7.2 mmol/L).  · After meals (postprandial): below 180 mg/dL (10 mmol/L).  · A1c level: less than 7%.  Write down the times that you will check your blood sugar levels:  Blood sugar checks  · Time: _______________ Notes: ___________________________________  · Time: _______________ Notes: ___________________________________  · Time: _______________ Notes: ___________________________________  · Time: _______________ Notes: ___________________________________  · Time: _______________ Notes: ___________________________________  · Time: _______________ Notes: ___________________________________    What do I need to know about low blood sugar?  Low blood sugar is called hypoglycemia. This is when blood sugar is at or below 70 mg/dL (3.9 mmol/L). Symptoms may include:  · Feeling:  ? Hungry.  ? Worried or nervous (anxious).  ? Sweaty and clammy.  ? Confused.  ? Dizzy.  ? Sleepy.  ? Sick to your stomach (nauseous).  · Having:  ? A fast heartbeat.  ? A headache.  ? A change in your vision.  ? Tingling or no feeling (numbness) around the mouth, lips, or tongue.  ? Jerky movements that you cannot control (seizure).  · Having trouble with:  ? Moving (coordination).  ? Sleeping.  ? Passing out (fainting).  ? Getting upset easily (irritability).  Treating low blood sugar  To treat low blood  sugar, eat or drink something sugary right away. If you can think clearly and swallow safely, follow the 15:15   rule:  · Take 15 grams of a fast-acting carb (carbohydrate). Talk with your doctor about how much you should take.  · Some fast-acting carbs are:  ? Sugar tablets (glucose pills). Take 3-4 glucose pills.  ? 6-8 pieces of hard candy.  ? 4-6 oz (120-150 mL) of fruit juice.  ? 4-6 oz (120-150 mL) of regular (not diet) soda.  ? 1 Tbsp (15 mL) honey or sugar.  · Check your blood sugar 15 minutes after you take the carb.  · If your blood sugar is still at or below 70 mg/dL (3.9 mmol/L), take 15 grams of a carb again.  · If your blood sugar does not go above 70 mg/dL (3.9 mmol/L) after 3 tries, get help right away.  · After your blood sugar goes back to normal, eat a meal or a snack within 1 hour.  Treating very low blood sugar  If your blood sugar is at or below 54 mg/dL (3 mmol/L), you have very low blood sugar (severe hypoglycemia). This is an emergency. Do not wait to see if the symptoms will go away. Get medical help right away. Call your local emergency services (911 in the [place] Do not drive yourself to the hospital.  Questions to ask your health care provider  · Do I need to meet with a diabetes educator?  · What equipment will I need to care for myself at home?  · What diabetes medicines do I need? When should I take them?  · How often do I need to check my blood sugar?  · What number can I call if I have questions?  · When is my next doctor's visit?  · Where can I find a support group for people with diabetes?  Where to find more information  · American Diabetes Association: www.diabetes.org  · American Association of Diabetes Educators: www.diabeteseducator.org/patient-resources  Contact a doctor if:  · Your blood sugar is at or above 240 mg/dL (13.3 mmol/L) for 2 days in a row.  · You have been sick or have had a fever for 2 days or more, and you are not getting better.  · You have any of these  problems for more than 6 hours:  ? You cannot eat or drink.  ? You feel sick to your stomach (nauseous).  ? You throw up (vomit).  ? You have watery poop (diarrhea).  Get help right away if:  · Your blood sugar is lower than 54 mg/dL (3 mmol/L).  · You get confused.  · You have trouble:  ? Thinking clearly.  ? Breathing.  Summary  · Diabetes (diabetes mellitus) is a long-term (chronic) disease. It occurs when the body does not properly use sugar (glucose) that is released from food after digestion.  · Take insulin and diabetes medicines as told.  · Check your blood sugar every day, as often as told.  · Keep all follow-up visits as told by your doctor. This is important.  This information is not intended to replace advice given to you by your health care provider. Make sure you discuss any questions you have with your health care provider.  Document Released: 07/03/2017 Document Revised: 09/21/2017 Document Reviewed: 07/03/2017  Elsevier Interactive Patient Education © 2019 [employer]

## 2018-04-19 ENCOUNTER — Other Ambulatory Visit: Payer: Self-pay | Admitting: Internal Medicine

## 2018-04-19 ENCOUNTER — Other Ambulatory Visit: Payer: Self-pay | Admitting: Physician Assistant

## 2018-04-19 MED FILL — LOSARTAN POTASSIUM 100 MG T: 100 | 30 days supply | Qty: 30 | Fill #2

## 2018-04-20 MED FILL — HYDROCHLOROTHIAZIDE 12.5 MG: 12.5 | 30 days supply | Qty: 30 | Fill #0

## 2018-04-20 MED FILL — AMLODIPINE BESYLATE 10 MG T: 10 | 30 days supply | Qty: 30 | Fill #0

## 2018-04-21 ENCOUNTER — Ambulatory Visit (HOSPITAL_BASED_OUTPATIENT_CLINIC_OR_DEPARTMENT_OTHER): Payer: No Typology Code available for payment source | Attending: Internal Medicine | Admitting: Radiology

## 2018-04-21 DIAGNOSIS — G4733 Obstructive sleep apnea (adult) (pediatric): Secondary | ICD-10-CM

## 2018-04-29 ENCOUNTER — Encounter: Payer: Self-pay | Admitting: Registered"

## 2018-04-29 ENCOUNTER — Encounter: Payer: No Typology Code available for payment source | Attending: Internal Medicine | Admitting: Registered"

## 2018-04-29 DIAGNOSIS — E119 Type 2 diabetes mellitus without complications: Secondary | ICD-10-CM | POA: Insufficient documentation

## 2018-04-29 NOTE — Progress Notes (Signed)
Diabetes Self-Management Education  Visit Type: First/Initial  Appt. Start Time: 0900 Appt. End Time: 0938  04/29/2018  [name], identified by name and date of birth, is a 48 y.o. male with a diagnosis of Diabetes: Type 2.   This patient is accompanied in the office by his mother.  ASSESSMENT Pt states his A1c was in the normal range until recently. Pt states he has been having some health issues in the last 18 months and his doctor decided to check A1c again. Pt reports after being diagnosed he started checking his blood sugar often and 2 hrs after breakfast was in 300s but within a few days his numbers started coming down and now have been 101-175.  Pt states his father has T2DM and relies on medication to control BG and does not try to adjust his diet to help manage. Pt states he is motivated to do what he is able to prevent having to increase number of medications he has to take.  Pt reports that he has OSA and had not been sleeping well, until recently with new mask and feels sleep is improving and he is able to get up earlier more consistently. Pt states he historically he had not been much of a breakfast eater, but making an effort now to eat and will choose cereals with less sugar now. Pt reports that he is also paying more attention to portion sizes and has started using a smaller plate. Pt states he does not like straight water, but will drink sparkling water.  Pt states he is not working now and living with his parents who are on limited income. RD discussed affordable proteins and other tips for making healthy eating affordable. Pt's mother states her other son has T1DM and she is familiar with paying attention to carbs.   Pt had questions about fat and RD reviewed types of fat. Currently pt reports no structured activity and states he does not have restriction on exercise. RD discussed benefit of exercise on BG, but did not spend a lot of time discussing potential goals to  increase. May be a good topic for follow-up visit.   Diabetes Self-Management Education - 04/29/18 0906      Visit Information   Visit Type  First/Initial      Initial Visit   Diabetes Type  Type 2    Are you currently following a meal plan?  No    Are you taking your medications as prescribed?  Yes   1,000 mg metformin   Date Diagnosed  04/13/18      Health Coping   How would you rate your overall health?  Poor      Psychosocial Assessment   Patient Belief/Attitude about Diabetes  Motivated to manage diabetes    How often do you need to have someone help you when you read instructions, pamphlets, or other written materials from your doctor or pharmacy?  1 - Never    What is the last grade level you completed in school?  9      Complications   Last HgB A1C per patient/outside source  8.3 %    How often do you check your blood sugar?  3-4 times/day    Fasting Blood glucose range (mg/dL)  70-129;130-179   101-134   Number of hypoglycemic episodes per month  0    Number of hyperglycemic episodes per week  0    Have you had a dilated eye exam in the past 12 months?  No    Have you had a dental exam in the past 12 months?  No    Are you checking your feet?  No      Dietary Intake   Breakfast  cheerios, skim milk    Snack (morning)  peanuts or crackers    Lunch  whole grain bread sandwich, cheese    Snack (afternoon)  none OR peanuts, crackers    Dinner  spaghetti, bread, meat sauce (lean burger)    Snack (evening)  none OR vegebles    Beverage(s)  sparkling water, diet soda      Exercise   Exercise Type  ADL's    How many days per week to you exercise?  0    How many minutes per day do you exercise?  0    Total minutes per week of exercise  0      Patient Education   Previous Diabetes Education  No    Disease state   Definition of diabetes, type 1 and 2, and the diagnosis of diabetes    Nutrition management   Role of diet in the treatment of diabetes and the  relationship between the three main macronutrients and blood glucose level;Food label reading, portion sizes and measuring food.    Physical activity and exercise   Role of exercise on diabetes management, blood pressure control and cardiac health.    Medications  Reviewed patients medication for diabetes, action, purpose, timing of dose and side effects.    Monitoring  Identified appropriate SMBG and/or A1C goals.      Individualized Goals (developed by patient)   Nutrition  General guidelines for healthy choices and portions discussed    Monitoring   test my blood glucose as discussed      Outcomes   Expected Outcomes  Demonstrated interest in learning. Expect positive outcomes    Future DMSE  4-6 wks    Program Status  Not Completed      Individualized Plan for Diabetes Self-Management Training:   Learning Objective:  Patient will have a greater understanding of diabetes self-management. Patient education plan is to attend individual and/or group sessions per assessed needs and concerns.   Patient Instructions  Continue to use your CPAP for better sleep Aim to eat balanced meals and snacks Consider adding in more exercise Continue to take your medication as directed by MD Continue to check your blood sugar fasting and 2 hours after a meal Consider checking after several different breakfast foods to find what works to keep you in range.   Expected Outcomes:  Demonstrated interest in learning. Expect positive outcomes  Education material provided: ADA Diabetes: Your Take Control Guide, Snack sheet and Carbohydrate counting sheet, Types of Fat  If problems or questions, patient to contact team via:  Phone  Future DSME appointment: 4-6 wks

## 2018-04-29 NOTE — Patient Instructions (Signed)
Continue to use your CPAP for better sleep Aim to eat balanced meals and snacks Consider adding in more exercise Continue to take your medication as directed by MD Continue to check your blood sugar fasting and 2 hours after a meal Consider checking after several different breakfast foods to find what works to keep you in range.

## 2018-05-12 MED FILL — TRUE METRIX TEST STRIP: 30 days supply | Qty: 100 | Fill #1

## 2018-05-12 MED FILL — ?METFORMIN HCL 500MG TABLET: 500 | 30 days supply | Qty: 60 | Fill #1

## 2018-05-12 MED FILL — SERTRALINE HCL 100 MG TAB: 100 | 30 days supply | Qty: 30 | Fill #1

## 2018-05-17 ENCOUNTER — Ambulatory Visit: Payer: Self-pay | Attending: Family Medicine

## 2018-05-19 ENCOUNTER — Other Ambulatory Visit: Payer: Self-pay | Admitting: Internal Medicine

## 2018-05-19 DIAGNOSIS — I10 Essential (primary) hypertension: Secondary | ICD-10-CM

## 2018-05-19 MED FILL — HYDROCHLOROTHIAZIDE 12.5 MG: 12.5 | 30 days supply | Qty: 30 | Fill #1

## 2018-05-19 MED FILL — AMLODIPINE BESYLATE 5 MG TA: 5 | 30 days supply | Qty: 30 | Fill #2

## 2018-05-20 MED FILL — LOSARTAN POTASSIUM 100 MG T: 100 | 30 days supply | Qty: 30 | Fill #0

## 2018-05-21 ENCOUNTER — Ambulatory Visit: Payer: Self-pay | Attending: Nurse Practitioner | Admitting: Nurse Practitioner

## 2018-05-21 ENCOUNTER — Encounter: Payer: Self-pay | Admitting: Nurse Practitioner

## 2018-05-21 VITALS — BP 119/78 | HR 79 | Temp 97.7°F | Ht 70.0 in | Wt 303.2 lb

## 2018-05-21 DIAGNOSIS — M546 Pain in thoracic spine: Secondary | ICD-10-CM

## 2018-05-21 DIAGNOSIS — E119 Type 2 diabetes mellitus without complications: Secondary | ICD-10-CM

## 2018-05-21 LAB — GLUCOSE, POCT (MANUAL RESULT ENTRY): POC GLUCOSE: 151 mg/dL — AB (ref 70–99)

## 2018-05-21 MED ORDER — NAPROXEN 500 MG PO TABS: 500.0000 mg | ORAL_TABLET | Freq: Two times a day (BID) | ORAL | 0 refills | Status: AC

## 2018-05-21 MED ORDER — CYCLOBENZAPRINE HCL 10 MG PO TABS: 10.0000 mg | ORAL_TABLET | Freq: Three times a day (TID) | ORAL | 1 refills | Status: AC | PRN

## 2018-05-21 MED FILL — NAPROXEN 500 MG TABLET: 500 | 30 days supply | Qty: 60 | Fill #0

## 2018-05-21 MED FILL — CYCLOBENZAPRINE 10 MG TAB: 10 | 30 days supply | Qty: 60 | Fill #0

## 2018-05-21 NOTE — Patient Instructions (Signed)
Acute Back Pain, Adult Acute back pain is sudden and usually short-lived. It is often caused by an injury to the muscles and tissues in the back. The injury may result from:  A muscle or ligament getting overstretched or torn (strained). Ligaments are tissues that connect bones to each other. Lifting something improperly can cause a back strain.  Wear and tear (degeneration) of the spinal disks. Spinal disks are circular tissue that provides cushioning between the bones of the spine (vertebrae).  Twisting motions, such as while playing sports or doing yard work.  A hit to the back.  Arthritis. You may have a physical exam, lab tests, and imaging tests to find the cause of your pain. Acute back pain usually goes away with rest and home care. Follow these instructions at home: Managing pain, stiffness, and swelling  Take over-the-counter and prescription medicines only as told by your health care provider.  Your health care provider may recommend applying ice during the first 24-48 hours after your pain starts. To do this: ? Put ice in a plastic bag. ? Place a towel between your skin and the bag. ? Leave the ice on for 20 minutes, 2-3 times a day.  If directed, apply heat to the affected area as often as told by your health care provider. Use the heat source that your health care provider recommends, such as a moist heat pack or a heating pad. ? Place a towel between your skin and the heat source. ? Leave the heat on for 20-30 minutes. ? Remove the heat if your skin turns bright red. This is especially important if you are unable to feel pain, heat, or cold. You have a greater risk of getting burned. Activity   Do not stay in bed. Staying in bed for more than 1-2 days can delay your recovery.  Sit up and stand up straight. Avoid leaning forward when you sit, or hunching over when you stand. ? If you work at a desk, sit close to it so you do not need to lean over. Keep your chin tucked  in. Keep your neck drawn back, and keep your elbows bent at a right angle. Your arms should look like the letter "L." ? Sit high and close to the steering wheel when you drive. Add lower back (lumbar) support to your car seat, if needed.  Take short walks on even surfaces as soon as you are able. Try to increase the length of time you walk each day.  Do not sit, drive, or stand in one place for more than 30 minutes at a time. Sitting or standing for long periods of time can put stress on your back.  Do not drive or use heavy machinery while taking prescription pain medicine.  Use proper lifting techniques. When you bend and lift, use positions that put less stress on your back: ? Bend your knees. ? Keep the load close to your body. ? Avoid twisting.  Exercise regularly as told by your health care provider. Exercising helps your back heal faster and helps prevent back injuries by keeping muscles strong and flexible.  Work with a physical therapist to make a safe exercise program, as recommended by your health care provider. Do any exercises as told by your physical therapist. Lifestyle  Maintain a healthy weight. Extra weight puts stress on your back and makes it difficult to have good posture.  Avoid activities or situations that make you feel anxious or stressed. Stress and anxiety increase muscle   tension and can make back pain worse. Learn ways to manage anxiety and stress, such as through exercise. General instructions  Sleep on a firm mattress in a comfortable position. Try lying on your side with your knees slightly bent. If you lie on your back, put a pillow under your knees.  Follow your treatment plan as told by your health care provider. This may include: ? Cognitive or behavioral therapy. ? Acupuncture or massage therapy. ? Meditation or yoga. Contact a health care provider if:  You have pain that is not relieved with rest or medicine.  You have increasing pain going down  into your legs or buttocks.  Your pain does not improve after 2 weeks.  You have pain at night.  You lose weight without trying.  You have a fever or chills. Get help right away if:  You develop new bowel or bladder control problems.  You have unusual weakness or numbness in your arms or legs.  You develop nausea or vomiting.  You develop abdominal pain.  You feel faint. Summary  Acute back pain is sudden and usually short-lived.  Use proper lifting techniques. When you bend and lift, use positions that put less stress on your back.  Take over-the-counter and prescription medicines and apply heat or ice as directed by your health care provider. This information is not intended to replace advice given to you by your health care provider. Make sure you discuss any questions you have with your health care provider. Document Released: 03/31/2005 Document Revised: 11/05/2017 Document Reviewed: 11/12/2016 Elsevier Interactive Patient Education  2019 [employer].  Back Exercises If you have pain in your back, do these exercises 2-3 times each day or as told by your doctor. When the pain goes away, do the exercises once each day, but repeat the steps more times for each exercise (do more repetitions). If you do not have pain in your back, do these exercises once each day or as told by your doctor. Exercises Single Knee to Chest Do these steps 3-5 times in a row for each leg: 1. Lie on your back on a firm bed or the floor with your legs stretched out. 2. Bring one knee to your chest. 3. Hold your knee to your chest by grabbing your knee or thigh. 4. Pull on your knee until you feel a gentle stretch in your lower back. 5. Keep doing the stretch for 10-30 seconds. 6. Slowly let go of your leg and straighten it. Pelvic Tilt Do these steps 5-10 times in a row: 1. Lie on your back on a firm bed or the floor with your legs stretched out. 2. Bend your knees so they point up to the  ceiling. Your feet should be flat on the floor. 3. Tighten your lower belly (abdomen) muscles to press your lower back against the floor. This will make your tailbone point up to the ceiling instead of pointing down to your feet or the floor. 4. Stay in this position for 5-10 seconds while you gently tighten your muscles and breathe evenly. Cat-Cow Do these steps until your lower back bends more easily: 1. Get on your hands and knees on a firm surface. Keep your hands under your shoulders, and keep your knees under your hips. You may put padding under your knees. 2. Let your head hang down, and make your tailbone point down to the floor so your lower back is round like the back of a cat. 3. Stay in this position  for 5 seconds. 4. Slowly lift your head and make your tailbone point up to the ceiling so your back hangs low (sags) like the back of a cow. 5. Stay in this position for 5 seconds.  Press-Ups Do these steps 5-10 times in a row: 1. Lie on your belly (face-down) on the floor. 2. Place your hands near your head, about shoulder-width apart. 3. While you keep your back relaxed and keep your hips on the floor, slowly straighten your arms to raise the top half of your body and lift your shoulders. Do not use your back muscles. To make yourself more comfortable, you may change where you place your hands. 4. Stay in this position for 5 seconds. 5. Slowly return to lying flat on the floor.  Bridges Do these steps 10 times in a row: 1. Lie on your back on a firm surface. 2. Bend your knees so they point up to the ceiling. Your feet should be flat on the floor. 3. Tighten your butt muscles and lift your butt off of the floor until your waist is almost as high as your knees. If you do not feel the muscles working in your butt and the back of your thighs, slide your feet 1-2 inches farther away from your butt. 4. Stay in this position for 3-5 seconds. 5. Slowly lower your butt to the floor, and  let your butt muscles relax. If this exercise is too easy, try doing it with your arms crossed over your chest. Belly Crunches Do these steps 5-10 times in a row: 1. Lie on your back on a firm bed or the floor with your legs stretched out. 2. Bend your knees so they point up to the ceiling. Your feet should be flat on the floor. 3. Cross your arms over your chest. 4. Tip your chin a little bit toward your chest but do not bend your neck. 5. Tighten your belly muscles and slowly raise your chest just enough to lift your shoulder blades a tiny bit off of the floor. 6. Slowly lower your chest and your head to the floor. Back Lifts Do these steps 5-10 times in a row: 1. Lie on your belly (face-down) with your arms at your sides, and rest your forehead on the floor. 2. Tighten the muscles in your legs and your butt. 3. Slowly lift your chest off of the floor while you keep your hips on the floor. Keep the back of your head in line with the curve in your back. Look at the floor while you do this. 4. Stay in this position for 3-5 seconds. 5. Slowly lower your chest and your face to the floor. Contact a doctor if:  Your back pain gets a lot worse when you do an exercise.  Your back pain does not lessen 2 hours after you exercise. If you have any of these problems, stop doing the exercises. Do not do them again unless your doctor says it is okay. Get help right away if:  You have sudden, very bad back pain. If this happens, stop doing the exercises. Do not do them again unless your doctor says it is okay. This information is not intended to replace advice given to you by your health care provider. Make sure you discuss any questions you have with your health care provider. Document Released: 05/03/2010 Document Revised: 12/23/2017 Document Reviewed: 05/25/2014 Elsevier Interactive Patient Education  [employer].

## 2018-05-21 NOTE — Progress Notes (Signed)
Assessment & Plan:  Juan Kline was seen today for fall.  Diagnoses and all orders for this visit:  Acute left-sided thoracic back pain -     naproxen (NAPROSYN) 500 MG tablet; Take 1 tablet (500 mg total) by mouth 2 (two) times daily with a meal for 30 days. -     cyclobenzaprine (FLEXERIL) 10 MG tablet; Take 1 tablet (10 mg total) by mouth 3 (three) times daily as needed for up to 30 days for muscle spasms. Work on losing weight to help reduce back pain. May alternate with heat and ice application for pain relief. May also alternate with acetaminophen and Ibuprofen as prescribed for back pain. Other alternatives include massage, acupuncture and water aerobics.  You must stay active and avoid a sedentary lifestyle.    New onset type 2 diabetes mellitus (HCC) -     Glucose (CBG) Continue blood sugar control as discussed in office today, low carbohydrate diet, and regular physical exercise as tolerated, 150 minutes per week (30 min each day, 5 days per week, or 50 min 3 days per week). Keep blood sugar logs with fasting goal of 90-130 mg/dl, post prandial (after you eat) less than 180.  For Hypoglycemia: BS <60 and Hyperglycemia BS >400; contact the clinic ASAP. Annual eye exams and foot exams are recommended.     Patient has been counseled on age-appropriate routine health concerns for screening and prevention. These are reviewed and up-to-date. Referrals have been placed accordingly. Immunizations are up-to-date or declined.    Subjective:   Chief Complaint  Patient presents with  . Fall    Pt. stated he had a fall last Saturday and back hurt ever since.     HPI Juan Kline 48 y.o. male presents to office today with complaints of fall.   Back Pain Patient presents for presents evaluation of low back problems.  Symptoms have been present for 6 days and include pain in mid left thoracic area (stabbing and jolting in character; 4/10 in severity)Initial inciting event: He slipped in his  bedroom and fell on his right side and thinks he pulled a muscle in the left side of his back.  There was no loss of consciousness and he did not seek immediate treatment.  Symptoms are worst: no particular time of the day. Alleviating factors identifiable by patient are none. Pain goes away on its own after a few minutes.  Exacerbating factors identifiable by patient are bending, twisting. Treatments so far initiated by patient: none Previous lower back problems: none. Previous workup: none. Previous treatments: none.   Review of Systems  Constitutional: Negative.  Negative for chills, fever, malaise/fatigue and weight loss.  HENT: Negative.  Negative for nosebleeds.   Eyes: Negative.  Negative for blurred vision, double vision and photophobia.  Respiratory: Negative.  Negative for cough and shortness of breath.   Cardiovascular: Negative.  Negative for chest pain, palpitations, claudication and leg swelling.  Gastrointestinal: Negative.  Negative for heartburn, nausea and vomiting.  Genitourinary: Negative.  Negative for dysuria and flank pain.  Musculoskeletal: Positive for back pain and myalgias. Negative for falls.  Skin: Negative.  Negative for rash.  Neurological: Negative.  Negative for dizziness, tingling, sensory change, focal weakness, seizures, loss of consciousness, weakness and headaches.  Psychiatric/Behavioral: Negative.  Negative for depression and suicidal ideas.    Past Medical History:  Diagnosis Date  . Chronic foot ulcer ([place]) admitted 03/02/2014   medial border first metatarsal left foot  . Hypertension Dx 2009  .  Osteomyelitis left first metatarsal 03/02/2014  . Venous stasis ulcer of left lower extremity ([place])    [name] 03/02/2014    Past Surgical History:  Procedure Laterality Date  . NO PAST SURGERIES      Family History  Problem Relation Age of Onset  . Hypertension Mother   . Hyperlipidemia Mother   . Diabetes Mother   . Kidney disease Mother   .  Hypertension Father   . Hyperlipidemia Father   . Diabetes Brother   . Heart disease Maternal Grandmother   . Diabetes Maternal Grandfather   . Heart attack Paternal Grandmother   . Heart disease Paternal Grandfather     Social History Reviewed with no changes to be made today.   Outpatient Medications Prior to Visit  Medication Sig Dispense Refill  . amLODipine (NORVASC) 10 MG tablet TAKE 1 TABLET BY MOUTH DAILY. 30 tablet 2  . Blood Glucose Monitoring Suppl (TRUE METRIX METER) w/Device KIT Use as directed 1 kit 0  . glucose blood (TRUE METRIX BLOOD GLUCOSE TEST) test strip Use as instructed 100 each 12  . hydrochlorothiazide (MICROZIDE) 12.5 MG capsule TAKE 1 TABLET (12.5 MG TOTAL) BY MOUTH DAILY. 90 capsule 0  . losartan (COZAAR) 100 MG tablet TAKE 1 TABLET BY MOUTH DAILY. 30 tablet 2  . metFORMIN (GLUCOPHAGE) 500 MG tablet Take 1 tablet (500 mg total) by mouth 2 (two) times daily with a meal. 60 tablet 5  . sertraline (ZOLOFT) 100 MG tablet Take 1 tablet (100 mg total) by mouth daily. 30 tablet 6  . TRUEPLUS LANCETS 28G MISC Use as directed 100 each 2   No facility-administered medications prior to visit.     Allergies  Allergen Reactions  . Lyrica [Pregabalin] Anxiety       Objective:    BP 119/78 (BP Location: Left Arm, Patient Position: Sitting, Cuff Size: Normal)   Pulse 79   Temp 97.7 F (36.5 C) (Oral)   Ht 5' 10" (1.778 m)   Wt (!) 303 lb 3.2 oz (137.5 kg)   SpO2 94%   BMI 43.50 kg/m  Wt Readings from Last 3 Encounters:  05/21/18 (!) 303 lb 3.2 oz (137.5 kg)  04/13/18 (!) 325 lb 12.8 oz (147.8 kg)  04/01/18 (!) 324 lb 6.4 oz (147.1 kg)    Physical Exam Vitals signs and nursing note reviewed.  Constitutional:      Appearance: He is well-developed.  HENT:     Head: Normocephalic and atraumatic.  Neck:     Musculoskeletal: Normal range of motion.  Cardiovascular:     Rate and Rhythm: Normal rate and regular rhythm.     Heart sounds: Normal heart  sounds. No murmur. No friction rub. No gallop.   Pulmonary:     Effort: Pulmonary effort is normal. No tachypnea or respiratory distress.     Breath sounds: Normal breath sounds. No decreased breath sounds, wheezing, rhonchi or rales.  Chest:     Chest wall: No tenderness.  Abdominal:     General: Bowel sounds are normal.  Musculoskeletal:        General: No swelling, tenderness or deformity.     Thoracic back: Normal. He exhibits normal range of motion and no tenderness.     Lumbar back: Normal.     Comments: Patient denies TTP however he does endorse pain is elicited in the left thoracic spine region with active bending and twisting of torso.   Skin:    General: Skin is warm and dry.  Neurological:     Mental Status: He is alert and oriented to person, place, and time.     Coordination: Coordination normal.     Gait: Gait normal.  Psychiatric:        Speech: Speech normal.        Behavior: Behavior normal. Behavior is cooperative.        Thought Content: Thought content normal. Thought content does not include homicidal or suicidal plan.        Judgment: Judgment normal.          Patient has been counseled extensively about nutrition and exercise as well as the importance of adherence with medications and regular follow-up. The patient was given clear instructions to go to ER or return to medical center if symptoms don't improve, worsen or new problems develop. The patient verbalized understanding.   Follow-up: No follow-ups on file.   Gildardo Pounds, FNP-BC [facility] and [facility] [place], [place]   05/21/2018, 9:05 AM

## 2018-06-03 ENCOUNTER — Ambulatory Visit: Payer: Self-pay | Admitting: Registered"

## 2018-06-14 MED FILL — ?METFORMIN HCL 500MG TABL: 500 | 30 days supply | Qty: 60 | Fill #2

## 2018-06-14 MED FILL — SERTRALINE HCL 100 MG TAB: 100 | 30 days supply | Qty: 30 | Fill #2

## 2018-06-16 MED FILL — AMLODIPINE BESYLATE 10 MG T: 10 | 30 days supply | Qty: 30 | Fill #1

## 2018-06-16 MED FILL — HYDROCHLOROTHIAZIDE 12.5 MG: 12.5 | 30 days supply | Qty: 30 | Fill #2

## 2018-06-16 MED FILL — LOSARTAN POTASSIUM 100 MG T: 100 | 30 days supply | Qty: 30 | Fill #1

## 2018-07-01 ENCOUNTER — Ambulatory Visit: Payer: Self-pay | Attending: Internal Medicine | Admitting: Internal Medicine

## 2018-07-01 ENCOUNTER — Other Ambulatory Visit: Payer: Self-pay

## 2018-07-01 ENCOUNTER — Encounter: Payer: Self-pay | Admitting: Internal Medicine

## 2018-07-01 VITALS — BP 116/82 | HR 93 | Temp 98.0°F | Resp 16 | Wt 297.0 lb

## 2018-07-01 DIAGNOSIS — I10 Essential (primary) hypertension: Secondary | ICD-10-CM

## 2018-07-01 DIAGNOSIS — Z6841 Body Mass Index (BMI) 40.0 and over, adult: Secondary | ICD-10-CM

## 2018-07-01 DIAGNOSIS — E119 Type 2 diabetes mellitus without complications: Secondary | ICD-10-CM

## 2018-07-01 LAB — GLUCOSE, POCT (MANUAL RESULT ENTRY): POC Glucose: 117 mg/dl — AB (ref 70–99)

## 2018-07-01 MED ORDER — AMLODIPINE BESYLATE 10 MG PO TABS: 10.0000 mg | ORAL_TABLET | Freq: Every day | ORAL | 6 refills | Status: DC

## 2018-07-01 MED ORDER — LOSARTAN POTASSIUM 100 MG PO TABS: 100.0000 mg | ORAL_TABLET | Freq: Every day | ORAL | 5 refills | Status: DC

## 2018-07-01 MED ORDER — HYDROCHLOROTHIAZIDE 12.5 MG PO CAPS: ORAL_CAPSULE | ORAL | 1 refills | Status: DC

## 2018-07-01 NOTE — Progress Notes (Signed)
Patient ID: Juan Kline, male    DOB: [birth year]  MRN: [id number]  CC: Hypertension and Diabetes   Subjective: Juan Kline is a 48 y.o. male who presents for chronic ds management His concerns today include:  HTN, HL, DM type 2, anxiety, chronic fatigue, severe OSA  DM: doing well with eating habits. Saw nutritionist and found it helpful.  Loss 28 lbs since last visit with me Checking BS a few times a wk.  Gives range 110-20 Tolerating Metformin Not able to afford eye exam.  Feels vision has changed over the yrs, problems seeing farther away.  Last eye exam was 4 yrs ago  HTN:  Limiting salt in foods.  Compliant with meds  OSA:  Compliant with CPAP.  Started [facility] and was set up with a couch and will be looking at a job next wk Patient Active Problem List   Diagnosis Date Noted  . Newly diagnosed diabetes (Juan Kline) 04/29/2018  . New onset type 2 diabetes mellitus (Juan Kline) 04/13/2018  . Mild depression (Juan Kline) 04/01/2018  . OSA (obstructive sleep apnea) 10/26/2017  . Anxiety disorder 10/26/2017  . Peripheral neuropathy 12/12/2016  . Anxiety state 05/11/2015  . Former smoker 05/11/2015  . Other hyperlipidemia 05/22/2014  . Vitamin D insufficiency 05/22/2014  . Numbness and tingling of foot 05/19/2014  . Venous stasis dermatitis of both lower extremities 05/19/2014  . history of Hypertension 03/02/2014  . Morbid obesity (Juan Kline) 03/02/2014     Current Outpatient Medications on File Prior to Visit  Medication Sig Dispense Refill  . Blood Glucose Monitoring Suppl (TRUE METRIX METER) w/Device KIT Use as directed 1 kit 0  . glucose blood (TRUE METRIX BLOOD GLUCOSE TEST) test strip Use as instructed 100 each 12  . metFORMIN (GLUCOPHAGE) 500 MG tablet Take 1 tablet (500 mg total) by mouth 2 (two) times daily with a meal. 60 tablet 5  . sertraline (ZOLOFT) 100 MG tablet Take 1 tablet (100 mg total) by mouth daily. 30 tablet 6  . TRUEPLUS LANCETS 28G MISC Use as directed 100 each 2   No  current facility-administered medications on file prior to visit.     Allergies  Allergen Reactions  . Lyrica [Pregabalin] Anxiety    Social History   Socioeconomic History  . Marital status: Single    Spouse name: Not on file  . Number of children: Not on file  . Years of education: Not on file  . Highest education level: Not on file  Occupational History  . Not on file  Social Needs  . Financial resource strain: Not on file  . Food insecurity:    Worry: Not on file    Inability: Not on file  . Transportation needs:    Medical: Not on file    Non-medical: Not on file  Tobacco Use  . Smoking status: Current Every Day Smoker    Packs/day: 0.10    Years: 26.00    Pack years: 2.60    Types: E-cigarettes    Last attempt to quit: 02/16/2014    Years since quitting: 4.3  . Smokeless tobacco: Never Used  Substance and Sexual Activity  . Alcohol use: Yes    Comment: 03/02/2014 "I rarely drink"  . Drug use: No  . Sexual activity: Not on file  Lifestyle  . Physical activity:    Days per week: Not on file    Minutes per session: Not on file  . Stress: Not on file  Relationships  . Social  connections:    Talks on phone: Not on file    Gets together: Not on file    Attends religious service: Not on file    Active member of club or organization: Not on file    Attends meetings of clubs or organizations: Not on file    Relationship status: Not on file  . Intimate partner violence:    Fear of current or ex partner: Not on file    Emotionally abused: Not on file    Physically abused: Not on file    Forced sexual activity: Not on file  Other Topics Concern  . Not on file  Social History Narrative   Pt lives in 1 story home with his mother and step father   9th grade education   Last occupation was with [employer] in 2009    Family History  Problem Relation Age of Onset  . Hypertension Mother   . Hyperlipidemia Mother   . Diabetes Mother   . Kidney disease Mother   .  Hypertension Father   . Hyperlipidemia Father   . Diabetes Brother   . Heart disease Maternal Grandmother   . Diabetes Maternal Grandfather   . Heart attack Paternal Grandmother   . Heart disease Paternal Grandfather     Past Surgical History:  Procedure Laterality Date  . NO PAST SURGERIES      ROS: Review of Systems Negative except as stated above  PHYSICAL EXAM: BP 116/82   Pulse 93   Temp 98 F (36.7 C) (Oral)   Resp 16   Wt 297 lb (134.7 kg)   SpO2 95%   BMI 42.62 kg/m   Wt Readings from Last 3 Encounters:  07/01/18 297 lb (134.7 kg)  05/21/18 (!) 303 lb 3.2 oz (137.5 kg)  04/13/18 (!) 325 lb 12.8 oz (147.8 kg)    Physical Exam  General appearance - alert, well appearing, middle age caucasian male and in no distress Mental status - normal mood, behavior, speech, dress, motor activity, and thought processes Chest - clear to auscultation, no wheezes, rales or rhonchi, symmetric air entry Heart - normal rate, regular rhythm, normal S1, S2, no murmurs, rubs, clicks or gallops Extremities - peripheral pulses normal, no pedal edema, no clubbing or cyanosis  CMP Latest Ref Rng & Units 11/16/2017 10/06/2017 08/05/2017  Glucose 70 - 99 mg/dL 133(H) 148(H) 138(H)  BUN 6 - 20 mg/dL 25(H) 29(H) 16  Creatinine 0.61 - 1.24 mg/dL 0.90 1.04 0.99  Sodium 135 - 145 mmol/L 138 138 137  Potassium 3.5 - 5.1 mmol/L 3.5 3.8 4.1  Chloride 98 - 111 mmol/L 101 100 100(L)  CO2 22 - 32 mmol/L - 27 23  Calcium 8.9 - 10.3 mg/dL - 9.5 9.1  Total Protein 6.5 - 8.1 g/dL - - 6.6  Total Bilirubin 0.3 - 1.2 mg/dL - - 0.4  Alkaline Phos 38 - 126 U/L - - 39  AST 15 - 41 U/L - - 36  ALT 17 - 63 U/L - - 33   Lipid Panel     Component Value Date/Time   CHOL 285 (H) 12/10/2016 1144   TRIG 247 (H) 12/10/2016 1144   HDL 73 12/10/2016 1144   CHOLHDL 3.9 12/10/2016 1144   CHOLHDL 5.6 (H) 02/28/2016 1244   VLDL 65 (H) 02/28/2016 1244   LDLCALC 163 (H) 12/10/2016 1144    CBC    Component  Value Date/Time   WBC 11.1 (H) 11/16/2017 0042   RBC 4.83 11/16/2017 0042  HGB 14.6 11/16/2017 0207   HGB 15.7 09/26/2016 1701   HCT 43.0 11/16/2017 0207   HCT 44.7 09/26/2016 1701   PLT 278 11/16/2017 0042   PLT 234 09/26/2016 1701   MCV 90.3 11/16/2017 0042   MCV 89 09/26/2016 1701   MCH 31.1 11/16/2017 0042   MCHC 34.4 11/16/2017 0042   RDW 12.9 11/16/2017 0042   RDW 15.0 09/26/2016 1701   LYMPHSABS 1.6 11/16/2017 0042   MONOABS 0.7 11/16/2017 0042   EOSABS 0.1 11/16/2017 0042   BASOSABS 0.0 11/16/2017 0042    ASSESSMENT AND PLAN: 1. Controlled type 2 diabetes mellitus without complication, without long-term current use of insulin ([place]) Commended him on changing eating habits and on wgh loss Encourage him on increasing physical activity - POCT glucose (manual entry) - Hemoglobin A1c  2. Essential hypertension At goal - losartan (COZAAR) 100 MG tablet; Take 1 tablet (100 mg total) by mouth daily.  Dispense: 30 tablet; Refill: 5 - hydrochlorothiazide (MICROZIDE) 12.5 MG capsule; TAKE 1 TABLET (12.5 MG TOTAL) BY MOUTH DAILY.  Dispense: 90 capsule; Refill: 1 - amLODipine (NORVASC) 10 MG tablet; Take 1 tablet (10 mg total) by mouth daily.  Dispense: 30 tablet; Refill: 6  3. Class 3 severe obesity due to excess calories with serious comorbidity and body mass index (BMI) of 40.0 to 44.9 in adult [facility]) See #1 above    Patient was given the opportunity to ask questions.  Patient verbalized understanding of the plan and was able to repeat key elements of the plan.   Orders Placed This Encounter  Procedures  . Hemoglobin A1c  . POCT glucose (manual entry)     Requested Prescriptions   Signed Prescriptions Disp Refills  . losartan (COZAAR) 100 MG tablet 30 tablet 5    Sig: Take 1 tablet (100 mg total) by mouth daily.  . hydrochlorothiazide (MICROZIDE) 12.5 MG capsule 90 capsule 1    Sig: TAKE 1 TABLET (12.5 MG TOTAL) BY MOUTH DAILY.  Marland Kitchen amLODipine (NORVASC) 10 MG tablet  30 tablet 6    Sig: Take 1 tablet (10 mg total) by mouth daily.    Return in about 3 months (around 10/01/2018).  Karle Plumber, MD, FACP

## 2018-07-01 NOTE — Patient Instructions (Signed)
Keep up the good work with healthy eating habits and regular exercise.  Try to get your eye exam done as soon as you are able to afford.

## 2018-07-02 ENCOUNTER — Telehealth: Payer: Self-pay

## 2018-07-02 LAB — HEMOGLOBIN A1C
ESTIMATED AVERAGE GLUCOSE: 131 mg/dL
Hgb A1c MFr Bld: 6.2 % — ABNORMAL HIGH (ref 4.8–5.6)

## 2018-07-02 NOTE — Telephone Encounter (Signed)
Contacted pt to go over lab results pt didn't answer left a detailed vm informing pt of results and if he has any questions or concerns to give me a call  

## 2018-07-12 MED FILL — HYDROCHLOROTHIAZIDE 12.5 MG: 12.5 | 30 days supply | Qty: 30 | Fill #0

## 2018-07-12 MED FILL — LOSARTAN POTASSIUM 100 MG T: 100 | 30 days supply | Qty: 30 | Fill #0

## 2018-07-12 MED FILL — AMLODIPINE BESYLATE 10 MG T: 10 | 30 days supply | Qty: 30 | Fill #0

## 2018-07-12 MED FILL — ?METFORMIN HCL 500MG TABL: 500 | 90 days supply | Qty: 180 | Fill #3

## 2018-07-12 MED FILL — SERTRALINE HCL 100 MG TAB: 100 | 30 days supply | Qty: 30 | Fill #3

## 2018-08-10 ENCOUNTER — Other Ambulatory Visit: Payer: Self-pay

## 2018-08-10 ENCOUNTER — Ambulatory Visit: Payer: Self-pay | Admitting: Internal Medicine

## 2018-08-10 ENCOUNTER — Ambulatory Visit: Payer: Self-pay | Attending: Internal Medicine | Admitting: Internal Medicine

## 2018-08-10 DIAGNOSIS — M25551 Pain in right hip: Secondary | ICD-10-CM

## 2018-08-10 MED ORDER — GABAPENTIN 100 MG PO CAPS
100.0000 mg | ORAL_CAPSULE | Freq: Two times a day (BID) | ORAL | 0 refills | Status: DC
Start: 2018-08-10 — End: 2018-08-19

## 2018-08-10 MED FILL — HYDROCHLOROTHIAZIDE 12.5 MG: 12.5 | 30 days supply | Qty: 30 | Fill #1

## 2018-08-10 MED FILL — ?AMLODIPINE BESYLATE 10 MG: 10 | 90 days supply | Qty: 90 | Fill #1

## 2018-08-10 MED FILL — SERTRALINE HCL 100 MG TAB: 100 | 30 days supply | Qty: 30 | Fill #4

## 2018-08-10 MED FILL — LOSARTAN POTASSIUM 100 MG T: 100 | 30 days supply | Qty: 30 | Fill #1

## 2018-08-10 MED FILL — GABAPENTIN 100 MG CAP: 100 | 14 days supply | Qty: 28 | Fill #0

## 2018-08-10 NOTE — Progress Notes (Signed)
Virtual Visit via Telephone Note Due to current restrictions/limitations of in-office visits due to the COVID-19 pandemic, this scheduled clinical appointment was converted to a telehealth visit  I connected with Juan Kline on 08/10/18 at 11:19 a.m by telephone  from my office and verified that I am speaking with the correct person using two identifiers.  Patient is at home.  Only the patient and myself participated in this telephone encounter.   I discussed the limitations, risks, security and privacy concerns of performing an evaluation and management service by telephone and the availability of in person appointments. I also discussed with the patient that there may be a patient responsible charge related to this service. The patient expressed understanding and agreed to proceed.   History of Present Illness: HTN, HL, DM type 2, anxiety, chronic fatigue, severe OSA.  Pt last seen 07/01/2018   Pt c/o intermittent pain RT upper leg laterally around the hip.  There for yrs but more intense over the past few wks Started a few wks ago while he was at work.  Lasted several hrs when it first started.  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  Does a lot of standing but only worked about 2 days a wk for past few wks.   Did not work today or yesterday due to symptoms Described as an intense burning Area is sensitive and tender to touch Worse when he was doing some lifting 3 days ago.    Observations/Objective: No direct observation done as this was a telephone encounter.  Assessment and Plan: 1. Pain of right hip joint Differential diagnoses include trochanteric bursitis versus nerve pain involving the lateral cutaneous nerve of the thigh I recommend Naprosyn 500 mg twice a day as needed.  Advised to take with food.  We will also add a low dose of gabapentin for 2 weeks.  Patient requested a note for work as he has been out yesterday and today.  He does not  work the rest of this week.  Letter written - gabapentin (NEURONTIN) 100 MG capsule; Take 1 capsule (100 mg total) by mouth 2 (two) times daily.  Dispense: 28 capsule; Refill: 0  Follow Up Instructions: PRN   I discussed the assessment and treatment plan with the patient. The patient was provided an opportunity to ask questions and all were answered. The patient agreed with the plan and demonstrated an understanding of the instructions.   The patient was advised to call back or seek an in-person evaluation if the symptoms worsen or if the condition fails to improve as anticipated.  I provided 15 minutes of non-face-to-face time during this encounter.   Karle Plumber, MD

## 2018-08-10 NOTE — Progress Notes (Signed)
Pt states his pain is coming from his upper thigh to hip   Pt states there's a burning sensation only in one area  Pt states he thinks its a nerve issue

## 2018-08-19 ENCOUNTER — Other Ambulatory Visit: Payer: Self-pay

## 2018-08-19 ENCOUNTER — Ambulatory Visit: Payer: Self-pay | Attending: Internal Medicine | Admitting: Internal Medicine

## 2018-08-19 DIAGNOSIS — M25551 Pain in right hip: Secondary | ICD-10-CM

## 2018-08-19 MED ORDER — GABAPENTIN 100 MG PO CAPS: ORAL_CAPSULE | ORAL | 1 refills | Status: DC

## 2018-08-19 MED FILL — GABAPENTIN 100 MG CAP: 100 | 23 days supply | Qty: 115 | Fill #0

## 2018-08-19 NOTE — Progress Notes (Signed)
Virtual Visit via Telephone Note Due to current restrictions/limitations of in-office visits due to the COVID-19 pandemic, this scheduled clinical appointment was converted to a telehealth visit  I connected with Juan Kline on 08/19/18 at 3:57 p.m EDT by telephone and verified that I am speaking with the correct person using two identifiers. I am in my office.  The patient is at home.  Only the patient and myself participated in this encounter.  I discussed the limitations, risks, security and privacy concerns of performing an evaluation and management service by telephone and the availability of in person appointments. I also discussed with the patient that there may be a patient responsible charge related to this service. The patient expressed understanding and agreed to proceed.   History of Present Illness: HTN, HL,DM type 2,anxiety, chronic fatigue, severe OSA.  Pt last seen 07/01/2018   I had a telephone encounter with the patient last week.  At that visit he complained of some pain in the right upper leg laterally around the hip.  I had placed him on gabapentin and recommended Naprosyn with that as needed.   Since then, he reports that symptoms have persisted.  Worked Monday and Tuesday of this wk.  Was sore on Monday.  On Tuesday, pain became intense after 1 hr at work.  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 Felt like his skin is ripping and burning and pain moving around to the hip.  Sore to touch Had to leave work early Taking Gabapentin as prescribed.  Quit Naprosyn when he started the Gabapentin.   Suppose to work on Saturday but would like note encase he is not able to go.  Working 10 hrs a wk Observations/Objective:  No direct observation done Assessment and Plan: 1. Pain of right hip joint Again differential diagnosis include neuropathic pain from lateral cutaneous nerve of the thigh versus trochanteric bursitis. Recommend that he  take the Naprosyn twice a day as needed with food.  We will increase gabapentin to 200 mg in the morning and 300 mg in the evening.  We will have him follow-up in the office in about 3 weeks - gabapentin (NEURONTIN) 100 MG capsule; Take 2 capsules in the morning and 3 capsules in the evening p.o.  Dispense: 115 capsule; Refill: 1  Work excuse provided for him to be off on Saturday   follow Up Instructions: Follow-up in person in 3 weeks   I discussed the assessment and treatment plan with the patient. The patient was provided an opportunity to ask questions and all were answered. The patient agreed with the plan and demonstrated an understanding of the instructions.   The patient was advised to call back or seek an in-person evaluation if the symptoms worsen or if the condition fails to improve as anticipated.  I provided 10 minutes of non-face-to-face time during this encounter.   Karle Plumber, MD

## 2018-08-19 NOTE — Progress Notes (Signed)
Pt states it feels like a nerve pain  Pt states he tried gabapentin but it hasn't helped and he had to leave work early  Pt states he is still the gabapentin

## 2018-08-30 ENCOUNTER — Telehealth: Payer: Self-pay | Admitting: Internal Medicine

## 2018-08-30 NOTE — Telephone Encounter (Signed)
Will forward to pcp

## 2018-08-30 NOTE — Telephone Encounter (Signed)
Patient called stating that his gabapentin is not working for him and he would like to know if he can stop taking with out having any side effects. Please f/u

## 2018-08-31 NOTE — Telephone Encounter (Signed)
Returned pt call and went over Dr. [name] response. Pt doesn't have any questions or concerns

## 2018-09-13 MED FILL — HYDROCHLOROTHIAZIDE 12.5 MG: 12.5 | 30 days supply | Qty: 30 | Fill #2

## 2018-09-13 MED FILL — LOSARTAN POTASSIUM 100 MG T: 100 | 30 days supply | Qty: 30 | Fill #2

## 2018-09-13 MED FILL — SERTRALINE HCL 100 MG TAB: 100 | 30 days supply | Qty: 30 | Fill #5

## 2018-10-05 ENCOUNTER — Ambulatory Visit: Payer: Self-pay | Attending: Internal Medicine | Admitting: Internal Medicine

## 2018-10-05 ENCOUNTER — Other Ambulatory Visit: Payer: Self-pay

## 2018-10-05 ENCOUNTER — Encounter: Payer: Self-pay | Admitting: Internal Medicine

## 2018-10-05 VITALS — BP 121/82 | HR 85 | Temp 97.8°F | Resp 18 | Ht 70.0 in | Wt 300.0 lb

## 2018-10-05 DIAGNOSIS — F17210 Nicotine dependence, cigarettes, uncomplicated: Secondary | ICD-10-CM | POA: Insufficient documentation

## 2018-10-05 DIAGNOSIS — Z888 Allergy status to other drugs, medicaments and biological substances status: Secondary | ICD-10-CM | POA: Insufficient documentation

## 2018-10-05 DIAGNOSIS — Z79899 Other long term (current) drug therapy: Secondary | ICD-10-CM | POA: Insufficient documentation

## 2018-10-05 DIAGNOSIS — Z7984 Long term (current) use of oral hypoglycemic drugs: Secondary | ICD-10-CM | POA: Insufficient documentation

## 2018-10-05 DIAGNOSIS — E1142 Type 2 diabetes mellitus with diabetic polyneuropathy: Secondary | ICD-10-CM | POA: Insufficient documentation

## 2018-10-05 DIAGNOSIS — Z8249 Family history of ischemic heart disease and other diseases of the circulatory system: Secondary | ICD-10-CM | POA: Insufficient documentation

## 2018-10-05 DIAGNOSIS — I10 Essential (primary) hypertension: Secondary | ICD-10-CM | POA: Insufficient documentation

## 2018-10-05 DIAGNOSIS — R0602 Shortness of breath: Secondary | ICD-10-CM

## 2018-10-05 DIAGNOSIS — M25551 Pain in right hip: Secondary | ICD-10-CM | POA: Insufficient documentation

## 2018-10-05 DIAGNOSIS — Z6841 Body Mass Index (BMI) 40.0 and over, adult: Secondary | ICD-10-CM | POA: Insufficient documentation

## 2018-10-05 DIAGNOSIS — R079 Chest pain, unspecified: Secondary | ICD-10-CM | POA: Insufficient documentation

## 2018-10-05 DIAGNOSIS — Z833 Family history of diabetes mellitus: Secondary | ICD-10-CM | POA: Insufficient documentation

## 2018-10-05 DIAGNOSIS — R42 Dizziness and giddiness: Secondary | ICD-10-CM | POA: Insufficient documentation

## 2018-10-05 DIAGNOSIS — F419 Anxiety disorder, unspecified: Secondary | ICD-10-CM | POA: Insufficient documentation

## 2018-10-05 DIAGNOSIS — F329 Major depressive disorder, single episode, unspecified: Secondary | ICD-10-CM | POA: Insufficient documentation

## 2018-10-05 DIAGNOSIS — G4733 Obstructive sleep apnea (adult) (pediatric): Secondary | ICD-10-CM | POA: Insufficient documentation

## 2018-10-05 MED ORDER — OMEPRAZOLE 20 MG PO CPDR: 20.0000 mg | DELAYED_RELEASE_CAPSULE | Freq: Every day | ORAL | 3 refills | Status: DC

## 2018-10-05 MED ORDER — GABAPENTIN 300 MG PO CAPS: ORAL_CAPSULE | ORAL | 6 refills | Status: DC

## 2018-10-05 MED FILL — GABAPENTIN 300 MG CAPSULE: 300 | 12 days supply | Qty: 60 | Fill #0

## 2018-10-05 MED FILL — ?OMEPRAZOLE 20 MG CAPSULE D: 20 | 30 days supply | Qty: 30 | Fill #0

## 2018-10-05 NOTE — Patient Instructions (Signed)
Restart Gabapentin 300 mg twice a day.  I have referred you to cardiology, psychiatry and neurology.   Start Omeprazole for possible heart burn.  If any further chest pain or increase shortness of breath, please be seen in the emergeny room.

## 2018-10-05 NOTE — Progress Notes (Signed)
Patient ID: Juan Kline, male    DOB: 1970-05-04  MRN: [id number]  CC: Follow-up   Subjective: Juan Kline is a 48 y.o. male who presents for chronic ds management with multiple concerns. His concerns today include:  HTN, HL,DM type 2,anxiety, chronic fatigue, severe OSA.  Presents today with multiple concerns.  Patient complains of intermittent feelings of "having spells again" for which we had referred him to neurology in the past.  Symptoms were felt to be anxiety related.  He is on Zoloft which he takes consistently.  Constant feeling of dizziness and lightheaded.  Especially when he bends over.  No headaches.  No ringing in ears or hearing changes.  Hb of 15 on last CBC Compliant with BP meds.   Complains of feeling sharp chest pains on and off since yesterday.  Is associated with intermittent feeling of burning in the epigastric region that is better with food.  Pain does not radiate down to  arms or up the neck.  He felt short of breath on his way over here today.  No recent long distance travel.  No swelling in the legs.  No previous hx DVT/PE.  No fever or cough. No hemoptysis.  No underlaying cancer dx.   Had exercise stress test in the past that was negative.  Echo with nl EF with grade 1 diastolic dysfunction  Still has the burning sensation on the lateral aspect of the right thigh.  He felt it was no better with gabapentin so he discontinued taking it.   Complains of intermittent numbness in both feet and toes.  Present for over 2 years.  He also gets intermittent pain in the toes on both feet. Reportedly tried on Lyrica in the past but it caused mood swings.  DM:  Checks BS once a day with range 100-130.   Doing okay with eating habits.  Avoids sugar Not much exercise.  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  He feels he needs to be on disability.  Scheduled to work again in 2  days and wonders whether he would be able to go in or not.  Does a lot of standing Patient Active Problem List   Diagnosis Date Noted  . Newly diagnosed diabetes ([place]) 04/29/2018  . New onset type 2 diabetes mellitus ([place]) 04/13/2018  . Mild depression ([place]) 04/01/2018  . OSA (obstructive sleep apnea) 10/26/2017  . Anxiety disorder 10/26/2017  . Peripheral neuropathy 12/12/2016  . Anxiety state 05/11/2015  . Former smoker 05/11/2015  . Other hyperlipidemia 05/22/2014  . Vitamin D insufficiency 05/22/2014  . Numbness and tingling of foot 05/19/2014  . Venous stasis dermatitis of both lower extremities 05/19/2014  . history of Hypertension 03/02/2014  . Morbid obesity ([place]) 03/02/2014     Current Outpatient Medications on File Prior to Visit  Medication Sig Dispense Refill  . amLODipine (NORVASC) 10 MG tablet Take 1 tablet (10 mg total) by mouth daily. 30 tablet 6  . Blood Glucose Monitoring Suppl (TRUE METRIX METER) w/Device KIT Use as directed 1 kit 0  . glucose blood (TRUE METRIX BLOOD GLUCOSE TEST) test strip Use as instructed 100 each 12  . hydrochlorothiazide (MICROZIDE) 12.5 MG capsule TAKE 1 TABLET (12.5 MG TOTAL) BY MOUTH DAILY. 90 capsule 1  . losartan (COZAAR) 100 MG tablet Take 1 tablet (100 mg total) by mouth daily.  30 tablet 5  . metFORMIN (GLUCOPHAGE) 500 MG tablet Take 1 tablet (500 mg total) by mouth 2 (two) times daily with a meal. 60 tablet 5  . sertraline (ZOLOFT) 100 MG tablet Take 1 tablet (100 mg total) by mouth daily. 30 tablet 6  . TRUEPLUS LANCETS 28G MISC Use as directed 100 each 2   No current facility-administered medications on file prior to visit.     Allergies  Allergen Reactions  . Lyrica [Pregabalin] Anxiety    Social History   Socioeconomic History  . Marital status: Single    Spouse name: Not on file  . Number of children: Not on file  . Years of education: Not on file  . Highest education level: Not on file  Occupational History  . Not  on file  Social Needs  . Financial resource strain: Not on file  . Food insecurity    Worry: Not on file    Inability: Not on file  . Transportation needs    Medical: Not on file    Non-medical: Not on file  Tobacco Use  . Smoking status: Current Every Day Smoker    Packs/day: 0.10    Years: 26.00    Pack years: 2.60    Types: E-cigarettes    Last attempt to quit: 02/16/2014    Years since quitting: 4.6  . Smokeless tobacco: Never Used  Substance and Sexual Activity  . Alcohol use: Yes    Comment: 03/02/2014 "I rarely drink"  . Drug use: No  . Sexual activity: Not Currently  Lifestyle  . Physical activity    Days per week: Not on file    Minutes per session: Not on file  . Stress: Not on file  Relationships  . Social Herbalist on phone: Not on file    Gets together: Not on file    Attends religious service: Not on file    Active member of club or organization: Not on file    Attends meetings of clubs or organizations: Not on file    Relationship status: Not on file  . Intimate partner violence    Fear of current or ex partner: Not on file    Emotionally abused: Not on file    Physically abused: Not on file    Forced sexual activity: Not on file  Other Topics Concern  . Not on file  Social History Narrative   Pt lives in 1 story home with his mother and step father   9th grade education   Last occupation was with [employer] in 2009    Family History  Problem Relation Age of Onset  . Hypertension Mother   . Hyperlipidemia Mother   . Diabetes Mother   . Kidney disease Mother   . Hypertension Father   . Hyperlipidemia Father   . Diabetes Brother   . Heart disease Maternal Grandmother   . Diabetes Maternal Grandfather   . Heart attack Paternal Grandmother   . Heart disease Paternal Grandfather     Past Surgical History:  Procedure Laterality Date  . NO PAST SURGERIES      ROS: Review of Systems Negative except as stated above  PHYSICAL EXAM:  BP 121/82 (BP Location: Left Arm, Patient Position: Sitting, Cuff Size: Normal)   Pulse 85   Temp 97.8 F (36.6 C) (Oral)   Resp 18   Ht _0  (1.778 m)   Wt 300 lb (136.1 kg)   SpO2 96%   BMI  43.05 kg/m   Wt Readings from Last 3 Encounters:  10/05/18 300 lb (136.1 kg)  07/01/18 297 lb (134.7 kg)  05/21/18 (!) 303 lb 3.2 oz (137.5 kg)   BP 124/85 P83 sitting; 114/78 P91 standing Pox 96 sitting.  93-96% walking Physical Exam  General appearance - alert, well appearing, obese caucasian male and in no distress Mental status - quiet demeanor Mouth - mucous membranes moist, pharynx normal without lesions Neck - supple, no significant adenopathy Chest - clear to auscultation, no wheezes, rales or rhonchi, symmetric air entry Heart - normal rate, regular rhythm, normal S1, S2, no murmurs, rubs, clicks or gallops Neurological - LEAP:  Decrease sensation from above the ankles down CN grossly intact Power 5/5 in all 4s. Gait nl Extremities - + varicose veins lower legs LT>RT.  Good pulses in feet  BS 159 EKG:  SR, 1st degree AV block, no acute changes  CMP Latest Ref Rng & Units 11/16/2017 10/06/2017 08/05/2017  Glucose 70 - 99 mg/dL 133(H) 148(H) 138(H)  BUN 6 - 20 mg/dL 25(H) 29(H) 16  Creatinine 0.61 - 1.24 mg/dL 0.90 1.04 0.99  Sodium 135 - 145 mmol/L 138 138 137  Potassium 3.5 - 5.1 mmol/L 3.5 3.8 4.1  Chloride 98 - 111 mmol/L 101 100 100(L)  CO2 22 - 32 mmol/L - 27 23  Calcium 8.9 - 10.3 mg/dL - 9.5 9.1  Total Protein 6.5 - 8.1 g/dL - - 6.6  Total Bilirubin 0.3 - 1.2 mg/dL - - 0.4  Alkaline Phos 38 - 126 U/L - - 39  AST 15 - 41 U/L - - 36  ALT 17 - 63 U/L - - 33   Lipid Panel     Component Value Date/Time   CHOL 285 (H) 12/10/2016 1144   TRIG 247 (H) 12/10/2016 1144   HDL 73 12/10/2016 1144   CHOLHDL 3.9 12/10/2016 1144   CHOLHDL 5.6 (H) 02/28/2016 1244   VLDL 65 (H) 02/28/2016 1244   LDLCALC 163 (H) 12/10/2016 1144    CBC    Component Value Date/Time   WBC  11.1 (H) 11/16/2017 0042   RBC 4.83 11/16/2017 0042   HGB 14.6 11/16/2017 0207   HGB 15.7 09/26/2016 1701   HCT 43.0 11/16/2017 0207   HCT 44.7 09/26/2016 1701   PLT 278 11/16/2017 0042   PLT 234 09/26/2016 1701   MCV 90.3 11/16/2017 0042   MCV 89 09/26/2016 1701   MCH 31.1 11/16/2017 0042   MCHC 34.4 11/16/2017 0042   RDW 12.9 11/16/2017 0042   RDW 15.0 09/26/2016 1701   LYMPHSABS 1.6 11/16/2017 0042   MONOABS 0.7 11/16/2017 0042   EOSABS 0.1 11/16/2017 0042   BASOSABS 0.0 11/16/2017 0042    ASSESSMENT AND PLAN: 1. Chest pain in adult Will refer him back to cardiology to assess whether nuclear ST indicated Advised to be seen in ER if pain recurs - EKG 12-Lead - omeprazole (PRILOSEC) 20 MG capsule; Take 1 capsule (20 mg total) by mouth daily.  Dispense: 30 capsule; Refill: 3  2. Dizziness Orthostatic vital check negative.  Still advised to go slow with position changes - Ambulatory referral to Neurology  3. Anxiety Continue Zoloft - Ambulatory referral to Psychiatry  4. Type 2 diabetes, controlled, with peripheral neuropathy ([place]) Will need to give adequate trail of Gabapentin and allow for titration up since he did not tolerate Lyrica in past.  He is agreeable to doing so - Glucose (CBG)  5. 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  Dispense: 60 capsule; Refill: 6  6. Essential hypertension   Not at the point where I can support application for disability He is off work today and tomorrow and can return on Thursday.  Addendum:  I forgot to order baseline labs.  Will get d-dimer as official reading of EKG by cardiology suggest pulmonary ds  Patient was given the opportunity to ask questions.  Patient verbalized understanding of the plan and was able to repeat key elements of the plan.   Orders Placed This Encounter   Procedures  . Ambulatory referral to Psychiatry  . Ambulatory referral to Neurology  . Glucose (CBG)  . EKG 12-Lead     Requested Prescriptions   Signed Prescriptions Disp Refills  . gabapentin (NEURONTIN) 300 MG capsule 60 capsule 6    Sig: Take 2 capsules in the morning and 3 capsules in the evening p.o.  Marland Kitchen omeprazole (PRILOSEC) 20 MG capsule 30 capsule 3    Sig: Take 1 capsule (20 mg total) by mouth daily.    Return in about 7 weeks (around 11/23/2018).  Karle Plumber, MD, FACP

## 2018-10-07 ENCOUNTER — Telehealth: Payer: Self-pay | Admitting: Internal Medicine

## 2018-10-07 NOTE — Telephone Encounter (Signed)
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 Please follow up on this letter for his employer as soon as possible.

## 2018-10-07 NOTE — Telephone Encounter (Signed)
Patients call returned.  Patient identified by name and date of birth.  Patient scheduled for lab work tomorrow.  Patient was asking for a note from Doctor.  Advised patient that information would be passed along and incoming information would be forwarded.  Patient acknowledged understanding of advice.

## 2018-10-07 NOTE — Telephone Encounter (Signed)
Pt would like a letter for restrictions at his job..please follow up

## 2018-10-07 NOTE — Telephone Encounter (Signed)
LMOVM to return call.

## 2018-10-08 ENCOUNTER — Ambulatory Visit: Payer: Self-pay | Attending: Internal Medicine

## 2018-10-08 ENCOUNTER — Other Ambulatory Visit: Payer: Self-pay

## 2018-10-08 DIAGNOSIS — R079 Chest pain, unspecified: Secondary | ICD-10-CM

## 2018-10-08 DIAGNOSIS — R0602 Shortness of breath: Secondary | ICD-10-CM

## 2018-10-09 LAB — COMPREHENSIVE METABOLIC PANEL
ALT: 16 IU/L (ref 0–44)
AST: 18 IU/L (ref 0–40)
Albumin/Globulin Ratio: 1.5 (ref 1.2–2.2)
Albumin: 4.1 g/dL (ref 4.0–5.0)
Alkaline Phosphatase: 46 IU/L (ref 39–117)
BUN/Creatinine Ratio: 29 — ABNORMAL HIGH (ref 9–20)
BUN: 22 mg/dL (ref 6–24)
Bilirubin Total: <0.2 mg/dL (ref 0.0–1.2)
CO2: 21 mmol/L (ref 20–29)
Calcium: 9 mg/dL (ref 8.7–10.2)
Chloride: 100 mmol/L (ref 96–106)
Creatinine, Ser: 0.77 mg/dL (ref 0.76–1.27)
GFR calc Af Amer: 125 mL/min/{1.73_m2} (ref >59)
GFR calc non Af Amer: 108 mL/min/{1.73_m2} (ref >59)
Globulin, Total: 2.8 g/dL (ref 1.5–4.5)
Glucose: 109 mg/dL — ABNORMAL HIGH (ref 65–99)
Potassium: 4.4 mmol/L (ref 3.5–5.2)
Sodium: 137 mmol/L (ref 134–144)
Total Protein: 6.9 g/dL (ref 6.0–8.5)

## 2018-10-09 LAB — CBC
Hematocrit: 44 % (ref 37.5–51.0)
Hemoglobin: 14 g/dL (ref 13.0–17.7)
MCH: 28.1 pg (ref 26.6–33.0)
MCHC: 31.8 g/dL (ref 31.5–35.7)
MCV: 88 fL (ref 79–97)
Platelets: 320 10*3/uL (ref 150–450)
RBC: 4.99 x10E6/uL (ref 4.14–5.80)
RDW: 14.3 % (ref 11.6–15.4)
WBC: 8.5 10*3/uL (ref 3.4–10.8)

## 2018-10-09 LAB — D-DIMER, QUANTITATIVE: D-DIMER: <0.2 mg/L FEU (ref 0.00–0.49)

## 2018-10-09 LAB — BRAIN NATRIURETIC PEPTIDE: BNP: 18.3 pg/mL (ref 0.0–100.0)

## 2018-10-11 ENCOUNTER — Telehealth: Payer: Self-pay | Admitting: Internal Medicine

## 2018-10-11 LAB — GLUCOSE, POCT (MANUAL RESULT ENTRY): POC Glucose: 222 mg/dl — AB (ref 70–99)

## 2018-10-11 NOTE — Telephone Encounter (Signed)
Please address letter for patient.

## 2018-10-11 NOTE — Telephone Encounter (Signed)
Unable to reach patient. Left message on voicemail.  

## 2018-10-11 NOTE — Telephone Encounter (Signed)
Pt request letter with specific restrictions for work so he can work with limitations while waiting for appts with specialists. 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 Please call pt when ready for him to pick up letter.

## 2018-10-12 ENCOUNTER — Other Ambulatory Visit: Payer: Self-pay | Admitting: Internal Medicine

## 2018-10-12 DIAGNOSIS — E119 Type 2 diabetes mellitus without complications: Secondary | ICD-10-CM

## 2018-10-12 MED FILL — SERTRALINE HCL 100 MG TAB: 100 | 30 days supply | Qty: 30 | Fill #6

## 2018-10-12 MED FILL — HYDROCHLOROTHIAZIDE 12.5 MG: 12.5 | 30 days supply | Qty: 30 | Fill #3

## 2018-10-12 MED FILL — LOSARTAN POTASSIUM 100 MG T: 100 | 30 days supply | Qty: 30 | Fill #3

## 2018-10-12 NOTE — Telephone Encounter (Signed)
-----   Message from Ladell Pier, MD sent at 10/10/2018 10:24 AM EDT ----- Let pt know that kidney and liver function tests are nl.  Blood count is nl meaning no anemia.  Blood test also suggest no congestive heart failure.

## 2018-10-12 NOTE — Telephone Encounter (Signed)
Patient verified DOB Patient is aware of all labs being normal. Patient will accept the letter that PCP states she can construct.

## 2018-10-13 ENCOUNTER — Telehealth: Payer: Self-pay | Admitting: Internal Medicine

## 2018-10-13 DIAGNOSIS — R0602 Shortness of breath: Secondary | ICD-10-CM

## 2018-10-13 MED FILL — ?METFORMIN HCL 500MG TABLET: 500 | 30 days supply | Qty: 60 | Fill #0

## 2018-10-13 NOTE — Telephone Encounter (Signed)
Letter completed and routed to my CMA.

## 2018-10-13 NOTE — Telephone Encounter (Signed)
Letter states patient is OOO until 12/23/18 patient did not want this letter. Only restrictions

## 2018-10-14 ENCOUNTER — Telehealth: Payer: Self-pay | Admitting: *Deleted

## 2018-10-14 NOTE — Telephone Encounter (Signed)
Patient is scheduling with them directly so he can let work know.

## 2018-10-14 NOTE — Telephone Encounter (Signed)
Patient has been made aware of procedure and stopping metformin 3 days AFTER imaging.

## 2018-10-14 NOTE — Telephone Encounter (Signed)
Note sent to PCP in secure chat on 10/13/2018 at 1553. Late entry to chart- The patient was informed that  know that  note was ready for pick up and he stated he came to office earlier today but the was a duplicate note from a time before. He asked to read off the note to make sure it wasn't the same note. He requested changes to be made to the note. I asked him what he need the note to say. He replied, "please excuse patient from running back and forth due to dizzy spells until he has follow up with Neurologist and Cardiologist". 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 He states he can stand but has SOB with moving and dizzy when he turns

## 2018-10-18 ENCOUNTER — Telehealth: Payer: Self-pay | Admitting: Neurology

## 2018-10-18 ENCOUNTER — Encounter: Payer: Self-pay | Admitting: Neurology

## 2018-10-18 ENCOUNTER — Telehealth (INDEPENDENT_AMBULATORY_CARE_PROVIDER_SITE_OTHER): Payer: Self-pay | Admitting: Neurology

## 2018-10-18 ENCOUNTER — Other Ambulatory Visit: Payer: Self-pay

## 2018-10-18 VITALS — Ht 69.0 in | Wt 300.0 lb

## 2018-10-18 DIAGNOSIS — R42 Dizziness and giddiness: Secondary | ICD-10-CM

## 2018-10-18 NOTE — Progress Notes (Signed)
Virtual Visit via Video Note The purpose of this virtual visit is to provide medical care while limiting exposure to the novel coronavirus.    Consent was obtained for video visit:  Yes.   Answered questions that patient had about telehealth interaction:  Yes.   I discussed the limitations, risks, security and privacy concerns of performing an evaluation and management service by telemedicine. I also discussed with the patient that there may be a patient responsible charge related to this service. The patient expressed understanding and agreed to proceed.  Pt location: Home Physician Location: office Name of referring provider:  Ladell Pier, MD I connected with Juan Kline at patients initiation/request on 10/18/2018 at  3:30 PM EDT by video enabled telemedicine application and verified that I am speaking with the correct person using two identifiers. Pt MRN:  [id number] Pt DOB:  [birth year] Video Participants:  Juan Kline   History of Present Illness:  The patient was last seen a year ago when he presented for generalized malaise, tinnitus, shakiness. I personally reviewed MRI brain with and without contrast done 11/2017 which was unremarkable, no acute changes. His routine and 48-hour EEG studies are normal. He had reported that he was doing better and had not had any episodes for a time. He presents today reporting recurrence of dizziness. He states that the symptoms a year ago went away for the most part. His symptoms currently have not gotten to the level of how he felt a year ago, reporting feeling bad again over the past month. He would feel lightheaded, flustered, things would get bright like he would pass out. He would start feeling short of breath, struggling to breath. He would have tunnel vision where it is difficult to talk. He would have a burning cold sensation in both hands/arms. Symptoms last 30-60 minutes, then he would feel wiped out after. He denies passing out but feels  like he has come close, where he almost could not see. The last intense episode was on 6/23 while driving to his doctor's office, he felt like he could not breathe and drove home, asking his father to drive him. When he got to the doctor's office, his BP was 120/70. He states he checks his glucose levels and they would always be normal between 100-120. One time his BP was 140/100. He has episodes every couple of weeks. He feels they occur more when he is too active or when he is bending down. He feels a pressure in the back of his head/neck, sometimes his neck is sore. He denies any recent change in stressors, he feels things are looking up, he got a new job 1-2 months ago, and is sleeping better with his CPAP machine. He denies any significant falls.    History on Initial Assessment 11/12/2017: This is a 48 year old right-handed man with a history of hypertension, anxiety, presenting for evaluation of recurrent symptoms that started around June 2018. He states symptoms started Father's Day last year, he suddenly felt strange, shaky, and scared. He noticed ringing in both ears and called EMS. He reports BP was "sky high" the first time, 200/100 per ER notes, in the ER BP was 147/112. He was not taking his BP medications as prescribed. Bloodwork was unremarkable, EKG showed normal sinus rhythm, incomplete RBBB. He was given prn Ativan for anxiety. He was back in the ER the next day for continued symptoms, he described a cold feeling coming over him, anxiety, BP was 166/118. EKG showed  NSR, left anterior fascicular block. Since then, he has been to the ER 4 more times for chest pain. He states that he overall does not feel well. Initially he would go a few months without symptoms, but now he has been to the ER every other month. A lot of times in the morning he wakes up and starts hearing a high pitched ringing and ears feel congested. He feels a burning in his head like he has to put a cold rag on it. Then his arms  become shaky and he feels warm down his arms. Legs are not affected. He sometimes feels a throbbing inside his head like his pulse, with a little pressure but not pain. He feels shaky and chilly, no nausea/vomiting. One day he recalls difficulty looking at certain colors, he could not stand to look at it because it looked bright. Another time he felt facial numbness and had difficulty talking lasting a couple of hours. When more severe, he would go to the ER. He would feel wiped out and sleepy afterwards. He states "I never feel good any day, head never feels right." Symptoms are worse when sitting up, he feels normal when supine. He denies any focal weakness, no confusion, olfactory/gustatory hallucinations, myoclonic jerks. He has been diagnosed with severe sleep apnea but is awaiting his CPAP machine due to lack of insurance.   He denies any diplopia, dysarthria/dysphagia, back pain, bowel/bladder dysfunction. He has some neck pain. He has always been an anxious person but was only started on Zoloft a few months ago. He has been unemployed for 10 years due to "lots of different things." His mother reports a few febrile convulsions when he was 37 or 48 years old, no seizure medications started. Otherwise he had a normal birth and early development.  There is no history of CNS infections such as meningitis/encephalitis, significant traumatic brain injury, neurosurgical procedures, or family history of seizures.     Current Outpatient Medications on File Prior to Visit  Medication Sig Dispense Refill  . amLODipine (NORVASC) 10 MG tablet Take 1 tablet (10 mg total) by mouth daily. 30 tablet 6  . Blood Glucose Monitoring Suppl (TRUE METRIX METER) w/Device KIT Use as directed 1 kit 0  . gabapentin (NEURONTIN) 300 MG capsule Take 2 capsules in the morning and 3 capsules in the evening p.o. 60 capsule 6  . glucose blood (TRUE METRIX BLOOD GLUCOSE TEST) test strip Use as instructed 100 each 12  .  hydrochlorothiazide (MICROZIDE) 12.5 MG capsule TAKE 1 TABLET (12.5 MG TOTAL) BY MOUTH DAILY. 90 capsule 1  . losartan (COZAAR) 100 MG tablet Take 1 tablet (100 mg total) by mouth daily. 30 tablet 5  . metFORMIN (GLUCOPHAGE) 500 MG tablet TAKE 1 TABLET (500 MG TOTAL) BY MOUTH 2 (TWO) TIMES DAILY WITH A MEAL. 60 tablet 2  . omeprazole (PRILOSEC) 20 MG capsule Take 1 capsule (20 mg total) by mouth daily. 30 capsule 3  . sertraline (ZOLOFT) 100 MG tablet Take 1 tablet (100 mg total) by mouth daily. 30 tablet 6  . TRUEPLUS LANCETS 28G MISC Use as directed 100 each 2   No current facility-administered medications on file prior to visit.      Observations/Objective:   Vitals:   10/18/18 1359  Weight: 300 lb (136.1 kg)  Height: 5' 9" (1.753 m)   GEN:  The patient appears stated age and is in NAD.  Neurological examination: Patient is awake, alert, oriented x 3. No aphasia or dysarthria.  Intact fluency and comprehension. Remote and recent memory intact. Able to name and repeat. Cranial nerves: Extraocular movements intact with no nystagmus, left esotropia. No facial asymmetry. Motor: moves all extremities symmetrically, at least anti-gravity x 4. No incoordination on finger to nose testing. Gait: narrow-based and steady, able to tandem walk adequately. Negative Romberg test.   Assessment and Plan:   This is a 48 yo RH man with a history of hypertension, anxiety, sleep apnea on CPAP, who presented a year ago with recurrent episodes where he would feel strange, with high pitched tinnitus, burning in his head, shakiness. MRI brain and 48-hour EEG were normal. Symptoms had abated, but over the past month he has been having episodes of feeling lightheaded with tunnel vision and shortness of breath. Neurological exam today is non-focal, although limited by nature of video visit. We discussed that symptoms are not consistent with a neurological etiology, with shortness of breath, agree with Cardiology  evaluation, but also discussed anxiety/panic attacks which can also cause similar symptoms. He reports awaiting consults with these specialties. Vestibular therapy may help with positional dizziness, however we discussed that inner ear issues would not cause his other symptoms. Follow-up prn, he knows to call for any changes.   Follow Up Instructions:   -I discussed the assessment and treatment plan with the patient. The patient was provided an opportunity to ask questions and all were answered. The patient agreed with the plan and demonstrated an understanding of the instructions.   The patient was advised to call back or seek an in-person evaluation if the symptoms worsen or if the condition fails to improve as anticipated.   Cameron Sprang, MD

## 2018-10-20 ENCOUNTER — Telehealth: Payer: Self-pay | Admitting: Internal Medicine

## 2018-10-20 NOTE — Telephone Encounter (Signed)
Patient may reach out to behavioral health to schedule. They are the ones contacting him.. the provider has already placed the referral.

## 2018-10-20 NOTE — Telephone Encounter (Signed)
Patient came in wanting to get an update on his psychiatry referral. Please follow up

## 2018-11-08 ENCOUNTER — Other Ambulatory Visit: Payer: Self-pay | Admitting: Internal Medicine

## 2018-11-08 DIAGNOSIS — F32A Depression, unspecified: Secondary | ICD-10-CM

## 2018-11-08 DIAGNOSIS — F419 Anxiety disorder, unspecified: Secondary | ICD-10-CM

## 2018-11-08 DIAGNOSIS — F320 Major depressive disorder, single episode, mild: Secondary | ICD-10-CM

## 2018-11-08 MED FILL — AMLODIPINE BESYLATE 10 MG T: 10 | 90 days supply | Qty: 90 | Fill #2

## 2018-11-08 MED FILL — metFORMIN HCL 500 MG TABS: 500 | 30 days supply | Qty: 60 | Fill #1

## 2018-11-08 MED FILL — LOSARTAN POTASSIUM 100 MG T: 100 | 30 days supply | Qty: 30 | Fill #4

## 2018-11-08 MED FILL — HYDROCHLOROTHIAZIDE 12.5 MG: 12.5 | 30 days supply | Qty: 30 | Fill #4

## 2018-11-09 MED FILL — SERTRALINE HCL 100 MG TAB: 100 | 30 days supply | Qty: 30 | Fill #0

## 2018-11-11 ENCOUNTER — Telehealth: Payer: Self-pay | Admitting: Internal Medicine

## 2018-11-11 NOTE — Telephone Encounter (Signed)
Will forward to pcp  Also there is FMLA in pcp box

## 2018-11-11 NOTE — Telephone Encounter (Signed)
If pt is aggreble to moving appointment up. It will need to be in the morning and the appointment is just for FMLA only

## 2018-11-11 NOTE — Telephone Encounter (Signed)
Patient is asking  For Dr [name] to write him a letter to go back to work . Please, call him  . Thank you

## 2018-11-11 NOTE — Telephone Encounter (Signed)
Made the appointment for tues he is trying to get mychart so that he can do a video with her

## 2018-11-16 ENCOUNTER — Other Ambulatory Visit: Payer: Self-pay

## 2018-11-16 ENCOUNTER — Encounter: Payer: Self-pay | Admitting: Internal Medicine

## 2018-11-16 ENCOUNTER — Ambulatory Visit (INDEPENDENT_AMBULATORY_CARE_PROVIDER_SITE_OTHER): Payer: Self-pay | Admitting: Internal Medicine

## 2018-11-16 DIAGNOSIS — Z0489 Encounter for examination and observation for other specified reasons: Secondary | ICD-10-CM

## 2018-11-16 DIAGNOSIS — Z7689 Persons encountering health services in other specified circumstances: Secondary | ICD-10-CM

## 2018-11-16 NOTE — Progress Notes (Signed)
Virtual Visit via Telephone Note Due to current restrictions/limitations of in-office visits due to the COVID-19 pandemic, this scheduled clinical appointment was converted to a telehealth visit  I connected with Juan Kline on 11/16/18 at 4:16 p.m by telephone and verified that I am speaking with the correct person using two identifiers. I am in my office.  The patient is at home.  Only the patient and myself participated in this encounter.  I discussed the limitations, risks, security and privacy concerns of performing an evaluation and management service by telephone and the availability of in person appointments. I also discussed with the patient that there may be a patient responsible charge related to this service. The patient expressed understanding and agreed to proceed.   History of Present Illness: HTN, HL,DM type 2,anxiety, chronic fatigue, severe OSA.  Purpose of today's visit is to discuss FMLA form.  After his last visit 10/05/2018, patient requested letter for work with work restrictions.  He works at a [employer].  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  Letter was given dated 10/13/2018.  After receiving this letter his employer, pulled him from work duties completely and told him he will need to remain out of work for the 6 to 8 weeks which was the length of time the restriction had called for.  Patient did not wish to be taken out of work completely but states he was not offered any explanation for them doing so. -He would like to return to work.  He works anywhere from 5 to 10 hours/week.  He told that the earliest he would be able to return to work be 11/24/2018 which would be 6 weeks from the time restriction letter was written. -Since last visit with me patient has seen the neurologist.  We are still  waiting for the cardiology and psychiatry appointment. -He is willing to return to work without restrictions   Observations/Objective: No direct observation done as this was a telephone visit  Assessment and Plan: 1. Return to work evaluation -Patient wanting to return to work without restrictions. FMLA form will be completed releasing him to return 11/24/2018.  We agreed that I will put on the form that he will need intermittent leave at least twice a month for a day for medical reasons or to go to an appointment. -We will await cardiology appointment.  Message sent to referral coordinator inquiring about appointment with a psychiatrist.   Follow Up Instructions: As previously scheduled   I discussed the assessment and treatment plan with the patient. The patient was provided an opportunity to ask questions and all were answered. The patient agreed with the plan and demonstrated an understanding of the instructions.   The patient was advised to call back or seek an in-person evaluation if the symptoms worsen or if the condition fails to improve as anticipated.  I provided 15 minutes of non-face-to-face time during this encounter.   Karle Plumber, MD

## 2018-11-16 NOTE — Progress Notes (Signed)
Patient here to discuss him being able to be released back to work.

## 2018-11-18 ENCOUNTER — Ambulatory Visit: Payer: Self-pay | Attending: Internal Medicine

## 2018-11-18 ENCOUNTER — Other Ambulatory Visit: Payer: Self-pay

## 2018-11-22 ENCOUNTER — Telehealth: Payer: Self-pay

## 2018-11-22 NOTE — Telephone Encounter (Signed)
Contacted pt and made aware that I have put his original FMLA up front for pickup. Pt doesn't have any questions or concerns

## 2018-11-23 ENCOUNTER — Ambulatory Visit: Payer: Self-pay | Admitting: Internal Medicine

## 2018-11-26 ENCOUNTER — Ambulatory Visit: Payer: Self-pay | Attending: Internal Medicine | Admitting: Internal Medicine

## 2018-11-26 ENCOUNTER — Other Ambulatory Visit: Payer: Self-pay

## 2018-11-26 DIAGNOSIS — R079 Chest pain, unspecified: Secondary | ICD-10-CM

## 2018-11-26 DIAGNOSIS — F419 Anxiety disorder, unspecified: Secondary | ICD-10-CM

## 2018-11-26 NOTE — Progress Notes (Signed)
Virtual Visit via Telephone Note Due to current restrictions/limitations of in-office visits due to the COVID-19 pandemic, this scheduled clinical appointment was converted to a telehealth visit  I connected with Juan Kline on 11/26/18 at 1:44 p.m by telephone and verified that I am speaking with the correct person using two identifiers. I am in my office.  The patient is at home.  Only the patient and myself participated in this encounter.  I discussed the limitations, risks, security and privacy concerns of performing an evaluation and management service by telephone and the availability of in person appointments. I also discussed with the patient that there may be a patient responsible charge related to this service. The patient expressed understanding and agreed to proceed.   History of Present Illness: HTN, HL,DM type 2 with neuropathy,anxiety, chronic fatigue, severe OSA  His work did receive FMLA form and letter that I wrote He is waiting to hear from them as to when they will allow him to return to work  Did video visit with Dr. Delice Lesch.  She did not feel that his intermittent episodes of feeling lightheaded, flustered, tunnel vision and difficulty speaking are neurologic events.  Thought they were more anxiety/panic attacks.  Patient feels he is doing okay currently on Zoloft.  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  He is currently living with his mom and stepfather and part of the agreement of him being allowed to stay there is that he needs to have an income.  He feels frustrated.  He states that he has been back in contact with vocational rehab and they are willing to refer him to lawyers who can help with applying for partial disability.  He wanted to speak with me to see whether I would support his application for disability.  He still has not received appointment as yet with the cardiologist to evaluate the chest pain and shortness of  breath.  He is also not received appointment as yet with psychiatry.    Current Outpatient Medications on File Prior to Visit  Medication Sig Dispense Refill  . amLODipine (NORVASC) 10 MG tablet Take 1 tablet (10 mg total) by mouth daily. 30 tablet 6  . Blood Glucose Monitoring Suppl (TRUE METRIX METER) w/Device KIT Use as directed 1 kit 0  . gabapentin (NEURONTIN) 300 MG capsule Take 2 capsules in the morning and 3 capsules in the evening p.o. 60 capsule 6  . glucose blood (TRUE METRIX BLOOD GLUCOSE TEST) test strip Use as instructed 100 each 12  . hydrochlorothiazide (MICROZIDE) 12.5 MG capsule TAKE 1 TABLET (12.5 MG TOTAL) BY MOUTH DAILY. 90 capsule 1  . losartan (COZAAR) 100 MG tablet Take 1 tablet (100 mg total) by mouth daily. 30 tablet 5  . metFORMIN (GLUCOPHAGE) 500 MG tablet TAKE 1 TABLET (500 MG TOTAL) BY MOUTH 2 (TWO) TIMES DAILY WITH A MEAL. 60 tablet 2  . omeprazole (PRILOSEC) 20 MG capsule Take 1 capsule (20 mg total) by mouth daily. 30 capsule 3  . sertraline (ZOLOFT) 100 MG tablet TAKE 1 TABLET (100 MG TOTAL) BY MOUTH DAILY. 30 tablet 2  . TRUEPLUS LANCETS 28G MISC Use as directed 100 each 2   No current facility-administered medications on file prior to visit.     Observations/Objective:  No direct observation none as this was a telephone encounter Assessment and Plan: 1. Anxiety -pt to continue Zoloft  Message sent to referral coordinator to inquire about psychiatry referral  2. Chest  pain in adult -message sent to referral coordinator to inquire about cardiology appt  While pt does have some neuropathy in feet likely from DM, I do not think he is at the point of needing to be on disability and I have told him this.   I would still like for him to get in with cardiology though to eval SOB/CP   Follow Up Instructions: 3 mths   I discussed the assessment and treatment plan with the patient. The patient was provided an opportunity to ask questions and all were  answered. The patient agreed with the plan and demonstrated an understanding of the instructions.   The patient was advised to call back or seek an in-person evaluation if the symptoms worsen or if the condition fails to improve as anticipated.  I provided 13 minutes of non-face-to-face time during this encounter.   Karle Plumber, MD

## 2018-11-30 ENCOUNTER — Other Ambulatory Visit: Payer: Self-pay

## 2018-11-30 ENCOUNTER — Encounter (HOSPITAL_COMMUNITY): Payer: Self-pay | Admitting: Psychiatry

## 2018-11-30 ENCOUNTER — Ambulatory Visit (INDEPENDENT_AMBULATORY_CARE_PROVIDER_SITE_OTHER): Payer: Self-pay | Admitting: Psychiatry

## 2018-11-30 ENCOUNTER — Telehealth: Payer: Self-pay | Admitting: Internal Medicine

## 2018-11-30 DIAGNOSIS — F32A Depression, unspecified: Secondary | ICD-10-CM

## 2018-11-30 DIAGNOSIS — F411 Generalized anxiety disorder: Secondary | ICD-10-CM

## 2018-11-30 DIAGNOSIS — F320 Major depressive disorder, single episode, mild: Secondary | ICD-10-CM

## 2018-11-30 DIAGNOSIS — F1021 Alcohol dependence, in remission: Secondary | ICD-10-CM

## 2018-11-30 MED ORDER — CLONAZEPAM 1 MG PO TBDP: 1.0000 mg | ORAL_TABLET | Freq: Every day | ORAL | 0 refills | Status: DC | PRN

## 2018-11-30 MED ORDER — SERTRALINE HCL 100 MG PO TABS: 150.0000 mg | ORAL_TABLET | Freq: Every day | ORAL | 2 refills | Status: DC

## 2018-11-30 NOTE — Telephone Encounter (Signed)
-----   Message from Ena Dawley sent at 11/29/2018  9:25 AM EDT ----- Kermit Balo Morning  I called Behavior Health  and they said that they contact the patient and lvm and he didn't call back   Mrs Jonelle Sidle is going to call him back again .   - Cardiology  Pt ware of his appointment  12/08/2018 2 9:30am at the [facility] office . ----- Message ----- From: Ladell Pier, MD Sent: 11/27/2018   4:18 PM EDT To: Ena Dawley  He has not received appt from Cardiology or psychiatry as yet.  Please look into this. Do I need to resend the cardiology referral?

## 2018-11-30 NOTE — Progress Notes (Signed)
Psychiatric Initial Adult Assessment   Patient Identification: Juan Kline MRN:  [id number] Date of Evaluation:  11/30/2018 Referral Source: Karle Plumber MD Chief Complaint:  "I am not sure why I am seeing you but I think because of anxiety" Visit Diagnosis:    ICD-10-CM   1. GAD (generalized anxiety disorder)  F41.1   2. 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 Still he would have at least a few in a month. They typically occur "unanounced" meaning he does not associate their occurrence with a clear trigger. They can  Be of different length and after they stop he feels tired and weak. He does not report any confusion afterwards and no incontinence during such episodes. Patient has been seen over past year by Dr. Charlotte Crumb and has had brain CT and MRI as well as EEG done - all basically normal. Patient's depressed mood was triggered by ultimatum given to him by mother/step-father last year that he either finds a job or will have to move out of their home. He works at a [employer] part time but financial stress is still present. He has been considering applying for disability. He has never felt suicidal and has no hx of inpatient psychiatric admissions. He denies having history of mania or psychosis. He has been taking sertraline since June of 2019 - currently 100 mg. He tolerates it well and feels that his general anxiety has subsided. Prior to sertraline he has tried escitalopram - does not recall if it was helpful.   Juan Kline admits to a hx of alcohol use disorder. He started to abuse alcohol in his 28s and eventually became a daily drinker (beer and  liquor). He denies having experienced any withdrawal sx or having any legal problems related to alcohol use. He has never been in OP or IP substance abuse treatment and quit on his own 4 years ago. He denies abusing street drugs now or in the past. He vapes daily after quitting smoking at the same time he quit drinking.  Patient's medical hx reviewed: he has hypertension, type 2 diabetes with neuropathy, OSA - uses CPAP - which likely is responsible for disturbed sleep and daytime fatigue.  Associated Signs/Symptoms: Depression Symptoms:  fatigue, anxiety, disturbed sleep, (Hypo) Manic Symptoms:  None Anxiety Symptoms:  Excessive Worry, Psychotic Symptoms:  None PTSD Symptoms: Negative  Past Psychiatric History: see above  Previous Psychotropic Medications: Yes   Substance Abuse History in the last 12 months:  No.  Consequences of Substance Abuse: Negative  Past Medical History:  Past Medical History:  Diagnosis Date  . Chronic foot ulcer ([place]) admitted 03/02/2014   medial border first metatarsal left foot  . Hypertension Dx 2009  . Osteomyelitis left first metatarsal 03/02/2014  . Venous stasis ulcer of left lower extremity ([place])    Juan Kline 03/02/2014    Past Surgical History:  Procedure Laterality Date  . NO PAST SURGERIES      Family Psychiatric History: None  Family History:  Family History  Problem Relation Age of Onset  . Hypertension Mother   . Hyperlipidemia Mother   . Diabetes Mother   . Kidney disease Mother   . Hypertension  Father   . Hyperlipidemia Father   . Diabetes Brother   . Heart disease Maternal Grandmother   . Diabetes Maternal Grandfather   . Heart attack Paternal Grandmother   . Heart disease Paternal Grandfather     Social History:   Social History   Socioeconomic History  . Marital status: Single    Spouse name: Not on file  . Number of children: 0  . Years of education: Not on file  . Highest education level: Not on file   Occupational History  . Not on file  Social Needs  . Financial resource strain: Not on file  . Food insecurity    Worry: Not on file    Inability: Not on file  . Transportation needs    Medical: Not on file    Non-medical: Not on file  Tobacco Use  . Smoking status: Current Every Day Smoker    Packs/day: 0.10    Years: 26.00    Pack years: 2.60    Types: E-cigarettes    Last attempt to quit: 02/16/2014    Years since quitting: 4.7  . Smokeless tobacco: Never Used  Substance and Sexual Activity  . Alcohol use: Yes    Comment: 03/02/2014 "I rarely drink"  . Drug use: No  . Sexual activity: Not Currently  Lifestyle  . Physical activity    Days per week: Not on file    Minutes per session: Not on file  . Stress: Not on file  Relationships  . Social Herbalist on phone: Not on file    Gets together: Not on file    Attends religious service: Not on file    Active member of club or organization: Not on file    Attends meetings of clubs or organizations: Not on file    Relationship status: Not on file  Other Topics Concern  . Not on file  Social History Narrative   Pt lives in 1 story home with his mother and step father   9th grade education   Last occupation was with [employer] in 2009      Unemployed at this time- thinking of filing for disability      Right handed         Allergies:   Allergies  Allergen Reactions  . Lyrica [Pregabalin] Anxiety    Metabolic Disorder Labs: Lab Results  Component Value Date   HGBA1C 6.2 (H) 07/01/2018   MPG 134 (H) 03/03/2014   No results found for: PROLACTIN Lab Results  Component Value Date   CHOL 285 (H) 12/10/2016   TRIG 247 (H) 12/10/2016   HDL 73 12/10/2016   CHOLHDL 3.9 12/10/2016   VLDL 65 (H) 02/28/2016   LDLCALC 163 (H) 12/10/2016   LDLCALC 124 (H) 02/28/2016   Lab Results  Component Value Date   TSH 4.170 08/13/2017    Therapeutic Level Labs: No results found for: LITHIUM No results found  for: CBMZ No results found for: VALPROATE  Current Medications: Current Outpatient Medications  Medication Sig Dispense Refill  . amLODipine (NORVASC) 10 MG tablet Take 1 tablet (10 mg total) by mouth daily. 30 tablet 6  . Blood Glucose Monitoring Suppl (TRUE METRIX METER) w/Device KIT Use as directed 1 kit 0  . clonazePAM (KLONOPIN) 1 MG disintegrating tablet Take 1 tablet (1 mg total) by mouth daily as needed for seizure. 20 tablet 0  . gabapentin (NEURONTIN) 300 MG capsule Take 2 capsules in the morning and 3 capsules  in the evening p.o. 60 capsule 6  . glucose blood (TRUE METRIX BLOOD GLUCOSE TEST) test strip Use as instructed 100 each 12  . hydrochlorothiazide (MICROZIDE) 12.5 MG capsule TAKE 1 TABLET (12.5 MG TOTAL) BY MOUTH DAILY. 90 capsule 1  . losartan (COZAAR) 100 MG tablet Take 1 tablet (100 mg total) by mouth daily. 30 tablet 5  . metFORMIN (GLUCOPHAGE) 500 MG tablet TAKE 1 TABLET (500 MG TOTAL) BY MOUTH 2 (TWO) TIMES DAILY WITH A MEAL. 60 tablet 2  . omeprazole (PRILOSEC) 20 MG capsule Take 1 capsule (20 mg total) by mouth daily. 30 capsule 3  . sertraline (ZOLOFT) 100 MG tablet Take 1.5 tablets (150 mg total) by mouth daily. 45 tablet 2  . TRUEPLUS LANCETS 28G MISC Use as directed 100 each 2   No current facility-administered medications for this visit.    Psychiatric Specialty Exam: Review of Systems  Constitutional: Positive for malaise/fatigue.  Neurological: Positive for tingling.  Psychiatric/Behavioral: The patient is nervous/anxious.   All other systems reviewed and are negative.   There were no vitals taken for this visit.There is no height or weight on file to calculate BMI.  General Appearance: Casual and Fairly Groomed  Eye Contact:  Good  Speech:  Clear and Coherent and Normal Rate  Volume:  Normal  Mood:  Anxious  Affect:  Full Range  Thought Process:  Goal Directed  Orientation:  Full (Time, Place, and Person)  Thought Content:  Logical  Suicidal  Thoughts:  No  Homicidal Thoughts:  No  Memory:  Immediate;   Fair Recent;   Good Remote;   Good  Judgement:  Fair  Insight:  Fair  Psychomotor Activity:  Normal  Concentration:  Concentration: Good  Recall:  Juan Kline of Knowledge:Fair  Language: Good  Akathisia:  Negative  Handed:  Right  AIMS (if indicated):  not done  Assets:  Communication Skills Desire for Improvement Housing  ADL's:  Intact  Cognition: WNL  Sleep:  Fair   Screenings: GAD-7     Office Visit from 11/26/2018 in [place] Office Visit from 07/01/2018 in [place] Office Visit from 05/21/2018 in [place] Office Visit from 04/13/2018 in [place] Office Visit from 04/01/2018 in [place]  Total GAD-7 Score  0  0  0  3  11    PHQ2-9     Office Visit from 11/26/2018 in [place] Office Visit from 07/01/2018 in [place] Visit from 05/21/2018 in [place] from [facility]2018 [facility] Visit from 04/13/2018 in [place]  PHQ-2 Total Score  0  0  0  0  2  PHQ-9 Total Score  -  2  1  -  8      Assessment and Plan: Patient is a 48 yo single male with approximately 2 year hx of increased anxiety, some situational depression and occasional spells/episodes of shaking, tunnel vision, SOB, dizziness, lightheadedness.e have been occurring frequently months ago (up to several in a month) but now are much less frequent. Still he would have at least a few in a month. They typically occur "unanounced" meaning he does not associate their occurrence with a clear trigger. They can  Be of different length and after they stop  he feels tired and weak. He does not report any confusion afterwards and no  incontinence during such episodes. Patient has been seen over past year by Dr. Charlotte Crumb and has had brain CT and MRI as well as EEG done - all basically normal. Patient's depressed mood was triggered by ultimatum given to him by mother/step-father last year that he either finds a job or will have to move out of their home. He works at a [employer] part time but financial stress is still present. He has been considering applying for disability. He has never felt suicidal and has no hx of inpatient psychiatric admissions. He denies having history of mania or psychosis. He has been taking sertraline since June of 2019 - currently 100 mg. He tolerates it well and feels that his general anxiety has subsided. Prior to sertraline he has tried escitalopram - does not recall if it was helpful.   Khylin admits to a hx of alcohol use disorder. He started to abuse alcohol in his 70s and eventually became a daily drinker (beer and liquor). He denies having experienced any withdrawal sx or having any legal problems related to alcohol use. He has never been in OP or IP substance abuse treatment and quit on his own 4 years ago. He denies abusing street drugs now or in the past.   Dx impression: Generalized anxiety disorder; r/o Panic disorder; Alcohol use disorder in remission  Plan: We will increase dose of sertraline further to 150 mg daily. I will add prn clonazepam ODT 1 mg to be taken as needed should he start feeling that one of these spells is coming. If it effectively stops it it will suggest that these are panic attacks (conversion episodes). He remains under continuing financial stress and finds it difficult to perform his job. He still will be seen by a cardiologist - as I understand. Even though we do not have proof yet, just suspicion that he may have panic disorder I already recommended to him starting psychotherapy focused on anxiety symptoms such as CBT. He will be contacted about appointment. The plan was  discussed with patient who had an opportunity to ask questions and these were all answered. I spend 60 minutes in videoconferencing with the patient and devoted approximately 50% of this time to explanation of diagnosis, discussion of treatment options and med education. Next appointment in 4 weeks.  Stephanie Acre, MD 8/18/20203:32 PM.

## 2018-12-02 ENCOUNTER — Other Ambulatory Visit (HOSPITAL_COMMUNITY): Payer: Self-pay

## 2018-12-02 MED FILL — SERTRALINE HCL 100 MG TAB: 100 | 30 days supply | Qty: 45 | Fill #0

## 2018-12-08 ENCOUNTER — Encounter: Payer: Self-pay | Admitting: Medical

## 2018-12-08 ENCOUNTER — Ambulatory Visit (INDEPENDENT_AMBULATORY_CARE_PROVIDER_SITE_OTHER): Payer: No Typology Code available for payment source | Admitting: Medical

## 2018-12-08 ENCOUNTER — Ambulatory Visit: Payer: Self-pay | Admitting: Medical

## 2018-12-08 ENCOUNTER — Other Ambulatory Visit: Payer: Self-pay

## 2018-12-08 VITALS — BP 124/84 | HR 92 | Temp 96.6°F | Ht 69.0 in | Wt 281.6 lb

## 2018-12-08 DIAGNOSIS — E785 Hyperlipidemia, unspecified: Secondary | ICD-10-CM

## 2018-12-08 DIAGNOSIS — I10 Essential (primary) hypertension: Secondary | ICD-10-CM

## 2018-12-08 DIAGNOSIS — F419 Anxiety disorder, unspecified: Secondary | ICD-10-CM

## 2018-12-08 DIAGNOSIS — R0602 Shortness of breath: Secondary | ICD-10-CM

## 2018-12-08 DIAGNOSIS — R079 Chest pain, unspecified: Secondary | ICD-10-CM

## 2018-12-08 DIAGNOSIS — E119 Type 2 diabetes mellitus without complications: Secondary | ICD-10-CM

## 2018-12-08 NOTE — Progress Notes (Signed)
Cardiology Office Note   Date:  12/09/2018   ID:  Juan Kline, DOB 1971/01/22, MRN [id number]  PCP:  Ladell Pier, MD  Cardiologist:  Pixie Casino, MD EP: None  Chief Complaint  Patient presents with  . Chest Pain  . Shortness of Breath      History of Present Illness: Juan Kline is a 48 y.o. male with a PMH of HTN, HLD, chronic venous stasis, chest pain with negative stress test, OSA on CPAP, anxiety, former tobacco abuse currently vaping, who presents for the evaluation of chest pain and SOB.  He was last evaluated by cardiology at an outpatient visit with Dr. Debara Pickett 01/2017 at which time his chest pain had improved but he was found to be hypertensive. HCTZ 12.54m daily was added to his regimen of amlodipine and losartan. He was recommended to follow-up with cardiology as needed. His last ischemic evaluation was an exercise treadmill stress test  In 2018 which showed no ischemia and echo in 2018 with EF 55-60%, G1DD, no RWMA, and no significant valvular abnormalities.  He was recently seen by psychiatry outpatient for the evaluation of his anxiety and it was felt that he likely had panic attacks as well, for which his sertraline was increased to 1568mdaily and he was given prn clonazepam. He was requested to follow-up with cardiology by his PCP to further evaluate chest pain/ SOB.   He reports having intermittent chest tightness with associated SOB which occurs without rhyme or reason. He attributes his chest tightness to anxiety. He does experience DOE without chest tightness at times which is his primary concern. He feels SOB limits his ability to work and he has been speaking with his PCP about obtaining disability. No complaints of orthopnea, PND, or LE edema. He is a former smoker who quit within the past 5 years and is currently vaping. His weight is down today but he feels our scales are inaccurate as he weight ~20lbs more last week. He leads a sedentary lifestyle. Suspect  his weight and deconditioning are contributing to his SOB. He reports stable palpitations which mostly occur when laying down to sleep at night. No complaints of dizziness, lightheadedness, or synocpe.     Past Medical History:  Diagnosis Date  . Chronic foot ulcer ([facility] 03/02/2014   medial border first metatarsal left foot  . Hypertension Dx 2009  . Osteomyelitis left first metatarsal 03/02/2014  . Venous stasis ulcer of left lower extremity ([facility]   /n[facility]chie Endo1/19/2015    Past Surgical History:  Procedure Laterality Date  . NO PAST SURGERIES       Current Outpatient Medications  Medication Sig Dispense Refill  . amLODipine (NORVASC) 10 MG tablet Take 1 tablet (10 mg total) by mouth daily. 30 tablet 6  . Blood Glucose Monitoring Suppl (TRUE METRIX METER) w/Device KIT Use as directed 1 kit 0  . gabapentin (NEURONTIN) 300 MG capsule Take 2 capsules in the morning and 3 capsules in the evening p.o. 60 capsule 6  . glucose blood (TRUE METRIX BLOOD GLUCOSE TEST) test strip Use as instructed 100 each 12  . hydrochlorothiazide (MICROZIDE) 12.5 MG capsule TAKE 1 TABLET (12.5 MG TOTAL) BY MOUTH DAILY. 90 capsule 1  . losartan (COZAAR) 100 MG tablet Take 1 tablet (100 mg total) by mouth daily. 30 tablet 5  . metFORMIN (GLUCOPHAGE) 500 MG tablet TAKE 1 TABLET (500 MG TOTAL) BY MOUTH 2 (TWO) TIMES DAILY WITH A MEAL. 60 tablet 2  .  sertraline (ZOLOFT) 100 MG tablet Take 1.5 tablets (150 mg total) by mouth daily. 45 tablet 2  . TRUEPLUS LANCETS 28G MISC Use as directed 100 each 2   No current facility-administered medications for this visit.     Allergies:   Lyrica [pregabalin]    Social History:  The patient  reports that he has been smoking e-cigarettes. He has a 2.60 pack-year smoking history. He has never used smokeless tobacco. He reports current alcohol use. He reports that he does not use drugs.   Family History:  The patient's family history includes Diabetes in his brother,  maternal grandfather, and mother; Heart attack in his paternal grandmother; Heart disease in his maternal grandmother and paternal grandfather; Hyperlipidemia in his father and mother; Hypertension in his father and mother; Kidney disease in his mother.    ROS:  Please see the history of present illness.   Otherwise, review of systems are positive for none.   All other systems are reviewed and negative.    PHYSICAL EXAM: VS:  BP 124/84   Pulse 92   Temp (!) 96.6 F (35.9 C) (Temporal)   Ht 5' 9"  (1.753 m)   Wt 281 lb 9.6 oz (127.7 kg)   BMI 41.59 kg/m  , BMI Body mass index is 41.59 kg/m. GEN: Obese gentleman, in no acute distress HEENT: sclera anicteric Neck: no JVD, carotid bruits, or masses Cardiac: RRR; no murmurs, rubs, or gallops, no edema; +vericose veins Respiratory:  clear to auscultation bilaterally, normal work of breathing GI: soft, nontender, nondistended, + BS MS: no deformity or atrophy Skin: warm and dry, no rash Neuro:  Strength and sensation are intact Psych: anxious, full affect   EKG:  EKG is not ordered today.  Recent Labs: 10/08/2018: ALT 16; BNP 18.3; BUN 22; Creatinine, Ser 0.77; Hemoglobin 14.0; Platelets 320; Potassium 4.4; Sodium 137    Lipid Panel    Component Value Date/Time   CHOL 285 (H) 12/10/2016 1144   TRIG 247 (H) 12/10/2016 1144   HDL 73 12/10/2016 1144   CHOLHDL 3.9 12/10/2016 1144   CHOLHDL 5.6 (H) 02/28/2016 1244   VLDL 65 (H) 02/28/2016 1244   LDLCALC 163 (H) 12/10/2016 1144      Wt Readings from Last 3 Encounters:  12/08/18 281 lb 9.6 oz (127.7 kg)  10/18/18 300 lb (136.1 kg)  10/05/18 300 lb (136.1 kg)      Other studies Reviewed: Additional studies/ records that were reviewed today include:   Echocardiogram 12/2016: Study Conclusions  - Left ventricle: The cavity size was normal. Wall thickness was increased in a pattern of mild LVH. Systolic function was normal.   The estimated ejection fraction was in the range  of 55% to 60%. Wall motion was normal; there were no regional wall motion   abnormalities. Doppler parameters are consistent with abnormal left ventricular relaxation (grade 1 diastolic dysfunction). - Mitral valve: Calcified annulus. - Left atrium: The atrium was mildly dilated. - Atrial septum: No defect or patent foramen ovale was identified.  Exercise tolerance test 12/2016:  The patient walked for 6:05 of a Bruce protocol. Peak HR of 151 which is 86% predicted maximal HR .  There were no ST or T wave changes to suggest ischemia  BP response was normal  Negative GXT    ASSESSMENT AND PLAN:  1. Chest pain: atypical chest pain likely related to anxiety. Stress test in 2018 was without ischemia. Echo in 2018 with normal EF and G1DD.  - Will check  an echocardiogram - if LV function is normal and no RWMA, will not pursue any further ischemic evaluation at this time.  2. SOB/DOE: this has been an issue for the past several months. He had G1DD on echo in 2018, though no complaints of PND, orthopnea, LE edema, or weight gain. He appears euvolemic on exam and BNP 09/2018 was wnl, therefore do not feel CHF explains his symptoms. He is a former smoking with ongoing vaping. Possible this is pulmonary in etiology - Will update an echocardiogram - Referral to pulmonology for possible pulmonary etiology  3. HTN: BP 124/84 today - Continue amlodipine, hydrochlorothiazide, and losartan  4. HLD: no recent lipids on file but was poorly controlled in 2018 with LDL 163. Not on a statin - Plan to repeat FLP prior to his next visit - Will start atrovastatin 53m daily  5. DM type 2: A1C 6.2 06/2018 - Continue metformin  6. Anxiety: likely contributing to #1. Recently established care with a psychiatrist. - Continue sertraline and prn clonazepam    Current medicines are reviewed at length with the patient today.  The patient does not have concerns regarding medicines.  The following changes have  been made:  no change  Labs/ tests ordered today include:   Orders Placed This Encounter  Procedures  . Ambulatory referral to Pulmonology  . ECHOCARDIOGRAM COMPLETE     Disposition:   FU with Dr. HRennie Plowmanin 3-6 months or sooner if new issues arise  Signed, KAbigail Butts PA-C  12/09/2018 10:45 AM

## 2018-12-08 NOTE — Patient Instructions (Signed)
Medication Instructions:  Your physician recommends that you continue on your current medications as directed. Please refer to the Current Medication list given to you today.  If you need a refill on your cardiac medications before your next appointment, please call your pharmacy.    Testing/Procedures: Your physician has requested that you have an echocardiogram. Echocardiography is a painless test that uses sound waves to create images of your heart. It provides your doctor with information about the size and shape of your heart and how well your heart's chambers and valves are working. This procedure takes approximately one hour. There are no restrictions for this procedure.   Follow-Up: At [facility], you and your health needs are our priority.  As part of our continuing mission to provide you with exceptional heart care, we have created designated Provider Care Teams.  These Care Teams include your primary Cardiologist (physician) and Advanced Practice Providers (APPs -  Physician Assistants and Nurse Practitioners) who all work together to provide you with the care you need, when you need it. You will need a follow up appointment in 3 months.  Please call our office 2 months in advance to schedule this appointment.  You may see Pixie Casino, MD or one of the following Advanced Practice Providers on your designated Care Team: [place], [place] . Fabian Sharp, PA-C  Any Other Special Instructions Will Be Listed Below (If Applicable). You have been referred to Pulmonology to evaluate your shortness of breath. They will call you to schedule this appointment.

## 2018-12-09 ENCOUNTER — Encounter: Payer: Self-pay | Admitting: Medical

## 2018-12-09 MED ORDER — ATORVASTATIN CALCIUM 20 MG PO TABS: 20.0000 mg | ORAL_TABLET | Freq: Every day | ORAL | 3 refills | Status: DC

## 2018-12-09 MED FILL — ?ATORVASTATIN 20 MG TABLET: 20 | 30 days supply | Qty: 30 | Fill #0

## 2018-12-13 MED FILL — HYDROCHLOROTHIAZIDE 12.5 MG: 12.5 | 30 days supply | Qty: 30 | Fill #5

## 2018-12-13 MED FILL — LOSARTAN POTASSIUM 100 MG T: 100 | 30 days supply | Qty: 30 | Fill #5

## 2018-12-13 MED FILL — metFORMIN HCL 500 MG TABS: 500 | 30 days supply | Qty: 60 | Fill #2

## 2018-12-14 ENCOUNTER — Other Ambulatory Visit (HOSPITAL_COMMUNITY): Payer: Self-pay

## 2018-12-30 ENCOUNTER — Other Ambulatory Visit: Payer: Self-pay

## 2018-12-30 ENCOUNTER — Ambulatory Visit (INDEPENDENT_AMBULATORY_CARE_PROVIDER_SITE_OTHER): Payer: No Typology Code available for payment source | Admitting: Psychiatry

## 2018-12-30 DIAGNOSIS — F320 Major depressive disorder, single episode, mild: Secondary | ICD-10-CM

## 2018-12-30 DIAGNOSIS — F32A Depression, unspecified: Secondary | ICD-10-CM

## 2018-12-30 DIAGNOSIS — F411 Generalized anxiety disorder: Secondary | ICD-10-CM

## 2018-12-30 MED ORDER — SERTRALINE HCL 100 MG PO TABS: 150.0000 mg | ORAL_TABLET | Freq: Every day | ORAL | 2 refills | Status: DC

## 2018-12-30 MED ORDER — TRAZODONE HCL 100 MG PO TABS: 100.0000 mg | ORAL_TABLET | Freq: Every evening | ORAL | 2 refills | Status: DC | PRN

## 2018-12-30 MED FILL — traZODone HCL 100 MG TABS: 100 | 30 days supply | Qty: 30 | Fill #0

## 2018-12-30 MED FILL — SERTRALINE HCL 100 MG TAB: 100 | 30 days supply | Qty: 45 | Fill #0

## 2018-12-30 NOTE — Progress Notes (Signed)
[place] MD/PA/NP OP Progress Note  12/30/2018 10:44 AM Juan Kline  MRN:  [id number] Interview was conducted by phone and I verified that I was speaking with the correct person using two identifiers. I discussed the limitations of evaluation and management by telemedicine and  the availability of in person appointments. Patient expressed understanding and agreed to proceed.  Chief Complaint: Worrying about future, middle insomnia.  HPI: Patient is a 48 yo single male with approximately 2 year hx of increased anxiety, some situational depression and occasional spells/episodes of shaking, tunnel vision, SOB, dizziness, lightheadedness.e have been occurring frequently months ago (up to several in a month) but now are much less frequent. He reports feeling dizzy often but the above mentioned "spells" have not occurred recently (in weeks). Patient has been seen over past year by Dr. Charlotte Crumb and has had brain CT and MRI as well as EEG done - all basically normal. Patient's depressed mood was originally triggered by ultimatum given to him by mother/step-father last year that he either finds a job or will have to move out of their home. He works at a [employer] part time but financial stress is still present. He has been considering applying for disability as he feels physically challenging to do his job. He has never felt suicidal and has no hx of inpatient psychiatric admissions. He denies having history of mania or psychosis. He has been taking sertraline since June of 2019 - we increased the dose a month ago and he is currently at 150 mg. He tolerates it well and feels that his anxiety has subsided. Prior to sertraline he has tried escitalopram - does not recall if it was helpful. Hr e reports having good appetite while sleep is interrupted. Ermal admits to a hx of alcohol use disorder but quit on his own 4 years ago. He denies abusing street drugs now or in the past.   Visit Diagnosis:    ICD-10-CM   1. GAD  (generalized anxiety disorder)  F41.1   2. Mild depression (HCC)  F32.0 sertraline (ZOLOFT) 100 MG tablet    Past Psychiatric History: Please see intake H&P.  Past Medical History:  Past Medical History:  Diagnosis Date  . Chronic foot ulcer ([facility]) admitted 03/02/2014   medial border first metatarsal left foot  . Hypertension Dx 2009  . Osteomyelitis left first metatarsal 03/02/2014  . Venous stasis ulcer of left lower extremity ([place])    [name] 03/02/2014    Past Surgical History:  Procedure Laterality Date  . NO PAST SURGERIES      Family Psychiatric History: None.  Family History:  Family History  Problem Relation Age of Onset  . Hypertension Mother   . Hyperlipidemia Mother   . Diabetes Mother   . Kidney disease Mother   . Hypertension Father   . Hyperlipidemia Father   . Diabetes Brother   . Heart disease Maternal Grandmother   . Diabetes Maternal Grandfather   . Heart attack Paternal Grandmother   . Heart disease Paternal Grandfather     Social History:  Social History   Socioeconomic History  . Marital status: Single    Spouse name: Not on file  . Number of children: 0  . Years of education: Not on file  . Highest education level: Not on file  Occupational History  . Not on file  Social Needs  . Financial resource strain: Not on file  . Food insecurity    Worry: Not on file    Inability:  Not on file  . Transportation needs    Medical: Not on file    Non-medical: Not on file  Tobacco Use  . Smoking status: Current Some Day Smoker    Packs/day: 0.10    Years: 26.00    Pack years: 2.60    Types: E-cigarettes    Last attempt to quit: 02/16/2014    Years since quitting: 4.8  . Smokeless tobacco: Never Used  Substance and Sexual Activity  . Alcohol use: Yes    Comment: 03/02/2014 "I rarely drink"  . Drug use: No  . Sexual activity: Not Currently  Lifestyle  . Physical activity    Days per week: Not on file    Minutes per session: Not on file   . Stress: Not on file  Relationships  . Social Herbalist on phone: Not on file    Gets together: Not on file    Attends religious service: Not on file    Active member of club or organization: Not on file    Attends meetings of clubs or organizations: Not on file    Relationship status: Not on file  Other Topics Concern  . Not on file  Social History Narrative   Pt lives in 1 story home with his mother and step father   9th grade education   Last occupation was with [employer] in 2009      Unemployed at this time- thinking of filing for disability      Right handed       Allergies:  Allergies  Allergen Reactions  . Lyrica [Pregabalin] Anxiety    Metabolic Disorder Labs: Lab Results  Component Value Date   HGBA1C 6.2 (H) 07/01/2018   MPG 134 (H) 03/03/2014   No results found for: PROLACTIN Lab Results  Component Value Date   CHOL 285 (H) 12/10/2016   TRIG 247 (H) 12/10/2016   HDL 73 12/10/2016   CHOLHDL 3.9 12/10/2016   VLDL 65 (H) 02/28/2016   LDLCALC 163 (H) 12/10/2016   LDLCALC 124 (H) 02/28/2016   Lab Results  Component Value Date   TSH 4.170 08/13/2017   TSH 2.310 09/26/2016    Therapeutic Level Labs: No results found for: LITHIUM No results found for: VALPROATE No components found for:  CBMZ  Current Medications: Current Outpatient Medications  Medication Sig Dispense Refill  . amLODipine (NORVASC) 10 MG tablet Take 1 tablet (10 mg total) by mouth daily. 30 tablet 6  . atorvastatin (LIPITOR) 20 MG tablet Take 1 tablet (20 mg total) by mouth daily. 90 tablet 3  . Blood Glucose Monitoring Suppl (TRUE METRIX METER) w/Device KIT Use as directed 1 kit 0  . gabapentin (NEURONTIN) 300 MG capsule Take 2 capsules in the morning and 3 capsules in the evening p.o. 60 capsule 6  . glucose blood (TRUE METRIX BLOOD GLUCOSE TEST) test strip Use as instructed 100 each 12  . hydrochlorothiazide (MICROZIDE) 12.5 MG capsule TAKE 1 TABLET (12.5 MG TOTAL) BY  MOUTH DAILY. 90 capsule 1  . losartan (COZAAR) 100 MG tablet Take 1 tablet (100 mg total) by mouth daily. 30 tablet 5  . metFORMIN (GLUCOPHAGE) 500 MG tablet TAKE 1 TABLET (500 MG TOTAL) BY MOUTH 2 (TWO) TIMES DAILY WITH A MEAL. 60 tablet 2  . sertraline (ZOLOFT) 100 MG tablet Take 1.5 tablets (150 mg total) by mouth daily. 45 tablet 2  . traZODone (DESYREL) 100 MG tablet Take 1 tablet (100 mg total) by mouth at bedtime  as needed for sleep. 30 tablet 2  . TRUEPLUS LANCETS 28G MISC Use as directed 100 each 2   No current facility-administered medications for this visit.      Psychiatric Specialty Exam: Review of Systems  Constitutional: Positive for malaise/fatigue.  Neurological: Positive for dizziness.  Psychiatric/Behavioral: The patient is nervous/anxious and has insomnia.   All other systems reviewed and are negative.   There were no vitals taken for this visit.There is no height or weight on file to calculate BMI.  General Appearance: NA  Eye Contact:  NA  Speech:  Clear and Coherent and Normal Rate  Volume:  Normal  Mood:  Anxious  Affect:  NA  Thought Process:  Goal Directed and Linear  Orientation:  Full (Time, Place, and Person)  Thought Content: Logical   Suicidal Thoughts:  No  Homicidal Thoughts:  No  Memory:  Immediate;   Good Recent;   Good Remote;   Good  Judgement:  Fair  Insight:  Fair  Psychomotor Activity:  NA  Concentration:  Concentration: Good  Recall:  Good  Fund of Knowledge: Good  Language: Good  Akathisia:  Negative  Handed:  Right  AIMS (if indicated): not done  Assets:  Communication Skills Desire for Improvement Housing Social Support  ADL's:  Intact  Cognition: WNL  Sleep:  Fair   Screenings: GAD-7     Office Visit from 11/26/2018 in [place] Office Visit from 07/01/2018 in [place] [place] Office Visit from 05/21/2018 in [place] Office Visit from  04/13/2018 in [place] Visit from 04/01/2018 in [place]  Total GAD-7 Score  0  0  0  3  11    PHQ2-9     Office Visit from 11/26/2018 in [place] Office Visit from 07/01/2018 in [facility] Office Visit from 05/21/2018 in [place] from [facility]/2018 in [facility] Visit from 04/13/2018 in [place]  PHQ-2 Total Score  0  0  0  0  2  PHQ-9 Total Score  -  2  1  -  8       Assessment and Plan: Patient is a 48 yo single male with approximately 2 year hx of increased anxiety, some situational depression and occasional spells/episodes of shaking, tunnel vision, SOB, dizziness, lightheadedness.e have been occurring frequently months ago (up to several in a month) but now are much less frequent. He reports feeling dizzy often but the above mentioned "spells" have not occurred recently (in weeks). Patient has been seen over past year by Dr. Charlotte Crumb and has had brain CT and MRI as well as EEG done - all basically normal. Patient's depressed mood was originally triggered by ultimatum given to him by mother/step-father last year that he either finds a job or will have to move out of their home. He works at a [employer] part time but financial stress is still present. He has been considering applying for disability as he feels physically challenging to do his job. He has never felt suicidal and has no hx of inpatient psychiatric admissions. He denies having history of mania or psychosis. He has been taking sertraline since June of 2019 - we increased the dose a month ago and he is currently at 150 mg. He tolerates  it well and feels that his anxiety has subsided. Prior to sertraline he has tried escitalopram - does not recall if it was helpful. Hr e reports having good appetite  while sleep is interrupted. Radin admits to a hx of alcohol use disorder but quit on his own 4 years ago. He denies abusing street drugs now or in the past.   Dx impression: Generalized anxiety disorder; r/o Panic disorder; Alcohol use disorder in remission  Plan: Continue sertraline 150 mg daily and add trazodone 50-100 mg prn middle insomnia. Next appointment in 2 months. The plan was discussed with patient who had an opportunity to ask questions and these were all answered. I spend 25 minutes in phone consultation with the patient.     Stephanie Acre, MD 12/30/2018, 10:44 AM

## 2019-01-10 ENCOUNTER — Other Ambulatory Visit: Payer: Self-pay | Admitting: Internal Medicine

## 2019-01-10 DIAGNOSIS — E119 Type 2 diabetes mellitus without complications: Secondary | ICD-10-CM

## 2019-01-10 MED FILL — ?ATORVASTATIN 20 MG TABLET: 20 | 30 days supply | Qty: 30 | Fill #1

## 2019-01-10 MED FILL — ?METFORMIN HCL 500MG TABLET: 500 | 30 days supply | Qty: 60 | Fill #0

## 2019-01-19 ENCOUNTER — Other Ambulatory Visit: Payer: Self-pay

## 2019-01-19 ENCOUNTER — Ambulatory Visit: Payer: No Typology Code available for payment source | Attending: Internal Medicine | Admitting: Pharmacist

## 2019-01-19 DIAGNOSIS — E785 Hyperlipidemia, unspecified: Secondary | ICD-10-CM

## 2019-01-19 DIAGNOSIS — Z23 Encounter for immunization: Secondary | ICD-10-CM

## 2019-01-19 NOTE — Progress Notes (Signed)
Orders placed.

## 2019-01-19 NOTE — Progress Notes (Signed)
Patient presents for vaccination against influenza per orders of Dr. Johnson. Consent given. Counseling provided. No contraindications exists. Vaccine administered without incident.   

## 2019-01-20 ENCOUNTER — Other Ambulatory Visit: Payer: Self-pay | Admitting: Internal Medicine

## 2019-01-20 DIAGNOSIS — I10 Essential (primary) hypertension: Secondary | ICD-10-CM

## 2019-01-20 MED FILL — ?HYDROCHLOROTHIAZIDE 12.5M: 12.5 | 30 days supply | Qty: 30 | Fill #0

## 2019-01-20 MED FILL — LOSARTAN POTASSIUM 100 MG T: 100 | 30 days supply | Qty: 30 | Fill #2

## 2019-01-26 ENCOUNTER — Other Ambulatory Visit (HOSPITAL_COMMUNITY): Payer: No Typology Code available for payment source

## 2019-02-04 ENCOUNTER — Other Ambulatory Visit: Payer: Self-pay

## 2019-02-04 ENCOUNTER — Ambulatory Visit (HOSPITAL_COMMUNITY): Payer: No Typology Code available for payment source | Attending: Cardiology

## 2019-02-04 DIAGNOSIS — R0602 Shortness of breath: Secondary | ICD-10-CM | POA: Insufficient documentation

## 2019-02-04 MED ORDER — PERFLUTREN LIPID MICROSPHERE
1.0000 mL | INTRAVENOUS | Status: AC | PRN
  Administered 2019-02-04: 2 mL via INTRAVENOUS

## 2019-02-07 MED FILL — SERTRALINE HCL 100 MG TAB: 100 | 30 days supply | Qty: 45 | Fill #1

## 2019-02-09 ENCOUNTER — Telehealth: Payer: Self-pay | Admitting: Medical

## 2019-02-09 ENCOUNTER — Telehealth: Payer: Self-pay

## 2019-02-09 NOTE — Telephone Encounter (Signed)
Follow Up:    Pt returning your call, concerning his results.

## 2019-02-09 NOTE — Telephone Encounter (Addendum)
Left a voice message for the patient to give our ofice a call so that someone can go over his Echo results.   ----- Message from Abigail Butts, PA-C sent at 02/06/2019  4:26 PM EDT ----- Please notify the patient that the ultrasound of his heart shows a good heart squeeze. He has some stiffening of his heart which is fairly common as we get older. This does not require any further work up. He should focus on eating a heart healthy diet going forward, including limiting his salt intake to <2g per day. Overall this was a reassuring ultrasound which does not suggest any decreased blood flow to his heart. He can follow-up with Dr. Debara Pickett as scheduled 03/14/2019. Thank you!

## 2019-02-09 NOTE — Progress Notes (Signed)
The patient has been notified of the result and verbalized understanding.  All questions (if any) were answered. Will mail out to the patient Heart Healthy diet information.  Jacqulynn Cadet, [place] 02/09/2019 2:38 PM

## 2019-02-09 NOTE — Telephone Encounter (Signed)
Called patient to go over results He verbalized an understanding and all questions if any were answered.

## 2019-02-14 ENCOUNTER — Encounter: Payer: Self-pay | Admitting: Critical Care Medicine

## 2019-02-14 ENCOUNTER — Other Ambulatory Visit: Payer: Self-pay

## 2019-02-14 ENCOUNTER — Ambulatory Visit (INDEPENDENT_AMBULATORY_CARE_PROVIDER_SITE_OTHER): Payer: No Typology Code available for payment source | Admitting: Critical Care Medicine

## 2019-02-14 ENCOUNTER — Telehealth: Payer: Self-pay | Admitting: Critical Care Medicine

## 2019-02-14 VITALS — BP 114/80 | HR 97 | Temp 97.7°F | Ht 69.0 in | Wt 330.8 lb

## 2019-02-14 DIAGNOSIS — I5032 Chronic diastolic (congestive) heart failure: Secondary | ICD-10-CM

## 2019-02-14 DIAGNOSIS — U070 Vaping-related disorder: Secondary | ICD-10-CM

## 2019-02-14 DIAGNOSIS — Z87891 Personal history of nicotine dependence: Secondary | ICD-10-CM

## 2019-02-14 DIAGNOSIS — R0602 Shortness of breath: Secondary | ICD-10-CM

## 2019-02-14 DIAGNOSIS — Z23 Encounter for immunization: Secondary | ICD-10-CM

## 2019-02-14 MED ORDER — ALBUTEROL SULFATE HFA 108 (90 BASE) MCG/ACT IN AERS: 2.0000 | INHALATION_SPRAY | Freq: Four times a day (QID) | RESPIRATORY_TRACT | 11 refills | Status: DC | PRN

## 2019-02-14 MED ORDER — TIOTROPIUM BROMIDE MONOHYDRATE 18 MCG IN CAPS: 18.0000 ug | ORAL_CAPSULE | Freq: Every day | RESPIRATORY_TRACT | 6 refills | Status: DC

## 2019-02-14 MED FILL — SPIRIVA 18 MCG CP-HANDIHALE: 18 | 30 days supply | Qty: 30 | Fill #0

## 2019-02-14 MED FILL — !VENTOLIN HFA INHALER: 108 (90 BAS | 25 days supply | Qty: 18 | Fill #0

## 2019-02-14 NOTE — Telephone Encounter (Signed)
This is scheduled for 03/21/19

## 2019-02-14 NOTE — Progress Notes (Signed)
Synopsis: Referred in November 2020 for Hartness of breath by Abigail Butts., PA-C.  Subjective:   PATIENT ID: Juan Kline GENDER: male DOB: Nov 09, 1970, MRN: [id number]  Chief Complaint  Patient presents with   Consult    shortness of breath for 2-3 months mostly with activity    Juan Kline is a 48 year old gentleman who presents for evaluation of shortness of breath.  This has been going on for the past 2 to 3 months and has remained stable throughout.  He noticed the shortness of breath occasionally when sitting still, but predominantly with exertion. His activity is limited due to dyspnea. He thinks that he can walk about a block slowly, and is able to walk around his house.  Some days he notices his symptoms more than others, but does not notice changes with weather.  He sometimes has dizziness with his dyspnea, but denies chest pain.  He denies cough, sputum production, wheezing.  He has occasional GERD, which he relates to taking amlodipine.  He currently vapes, using about 3 pods per day, which are compounded by someone at a local store, not a name brand.  He feels that stress could be a barrier to quitting.  He quit smoking cigarettes about 5 years ago after 25 years of 1-1.5 packs/day.  He is very inactive at baseline.  His cardiologist was concerned that he has a significant amount of deconditioning and obesity precipitating his dyspnea.  He has had panic attacks in the past, but thinks that this feels different.  He has gained about 30 pounds in the last few months, but has slowly been gaining weight for several years.  He had been this heavy several years ago, but was able to lose 40 to 60 pounds after a diabetic foot infection.  He is interested at quitting vaping-would like to titrate off rather than trying nicotine replacement therapy.  He has never had PFTs or been prescribed inhalers. He does not have allergies or history of asthma.  He had a grandmother with COPD.  He has had his  seasonal flu shot but needs his pneumonia shot.      Past Medical History:  Diagnosis Date   Chronic foot ulcer ([place]) admitted 03/02/2014   medial border first metatarsal left foot   Diabetes ([place])    Diabetic foot infection ([place])    Hyperlipidemia    Hypertension Dx 2009   OSA (obstructive sleep apnea)    CPAP   Osteomyelitis left first metatarsal 03/02/2014   Sleep apnea    Venous stasis ulcer of left lower extremity ([place])    [name] 03/02/2014     Family History  Problem Relation Age of Onset   Hypertension Mother    Hyperlipidemia Mother    Diabetes Mother    Kidney disease Mother    Hypertension Father    Hyperlipidemia Father    Diabetes Brother    Hypertension Brother    Hyperlipidemia Brother    Heart disease Maternal Grandmother    COPD Maternal Grandmother    Diabetes Maternal Grandfather    Heart attack Paternal Grandmother    Heart disease Paternal Grandfather      Past Surgical History:  Procedure Laterality Date   NO PAST SURGERIES    confirmed- no previous surgical history  Social History   Socioeconomic History   Marital status: Single    Spouse name: Not on file   Number of children: 0   Years of education: Not on file   Highest  education level: Not on file  Occupational History   Not on file  Social Needs   Financial resource strain: Not on file   Food insecurity    Worry: Not on file    Inability: Not on file   Transportation needs    Medical: Not on file    Non-medical: Not on file  Tobacco Use   Smoking status: Current Some Day Smoker    Packs/day: 1.50    Years: 26.00    Pack years: 39.00    Types: E-cigarettes, Cigarettes    Start date: 1989    Last attempt to quit: 02/16/2014    Years since quitting: 4.9   Smokeless tobacco: Never Used  Substance and Sexual Activity   Alcohol use: Yes    Comment: 03/02/2014 "I rarely drink"   Drug use: No   Sexual activity: Not Currently  Lifestyle     Physical activity    Days per week: Not on file    Minutes per session: Not on file   Stress: Not on file  Relationships   Social connections    Talks on phone: Not on file    Gets together: Not on file    Attends religious service: Not on file    Active member of club or organization: Not on file    Attends meetings of clubs or organizations: Not on file    Relationship status: Not on file   Intimate partner violence    Fear of current or ex partner: Not on file    Emotionally abused: Not on file    Physically abused: Not on file    Forced sexual activity: Not on file  Other Topics Concern   Not on file  Social History Narrative   Pt lives in 1 story home with his mother and step father   9th grade education   Last occupation was with [employer] in 2009      Unemployed at this time- thinking of filing for disability      Right handed        Allergies  Allergen Reactions   Lyrica [Pregabalin] Anxiety     Immunization History  Administered Date(s) Administered   Influenza Whole 01/19/2019   Influenza,inj,Quad PF,6+ Mos 03/03/2014, 01/17/2015, 02/28/2016, 02/10/2017, 12/31/2017   Pneumococcal Conjugate-13 02/14/2019   Pneumococcal Polysaccharide-23 03/03/2014   Tdap 06/28/2014    Outpatient Medications Prior to Visit  Medication Sig Dispense Refill   amLODipine (NORVASC) 10 MG tablet Take 1 tablet (10 mg total) by mouth daily. 30 tablet 6   atorvastatin (LIPITOR) 20 MG tablet Take 1 tablet (20 mg total) by mouth daily. 90 tablet 3   Blood Glucose Monitoring Suppl (TRUE METRIX METER) w/Device KIT Use as directed 1 kit 0   glucose blood (TRUE METRIX BLOOD GLUCOSE TEST) test strip Use as instructed 100 each 12   hydrochlorothiazide (MICROZIDE) 12.5 MG capsule TAKE 1 TABLET (12.5 MG TOTAL) BY MOUTH DAILY. 30 capsule 1   losartan (COZAAR) 100 MG tablet Take 1 tablet (100 mg total) by mouth daily. 30 tablet 5   metFORMIN (GLUCOPHAGE) 500 MG tablet TAKE 1  TABLET (500 MG TOTAL) BY MOUTH 2 (TWO) TIMES DAILY WITH A MEAL. 60 tablet 2   sertraline (ZOLOFT) 100 MG tablet Take 1.5 tablets (150 mg total) by mouth daily. 45 tablet 2   traZODone (DESYREL) 100 MG tablet Take 1 tablet (100 mg total) by mouth at bedtime as needed for sleep. 30 tablet 2   TRUEPLUS LANCETS  28G MISC Use as directed 100 each 2   gabapentin (NEURONTIN) 300 MG capsule Take 2 capsules in the morning and 3 capsules in the evening p.o. (Patient not taking: Reported on 02/14/2019) 60 capsule 6   No facility-administered medications prior to visit.     Review of Systems  Constitutional: Positive for malaise/fatigue. Negative for chills, diaphoresis, fever and weight loss.  HENT: Negative for congestion, ear pain and sore throat.   Eyes: Negative.   Respiratory: Positive for shortness of breath. Negative for cough, hemoptysis, sputum production and wheezing.   Cardiovascular: Positive for chest pain. Negative for palpitations and leg swelling.  Gastrointestinal: Positive for abdominal pain, heartburn and nausea. Negative for blood in stool and constipation.  Genitourinary: Positive for frequency. Negative for hematuria.  Musculoskeletal: Positive for joint pain and myalgias.  Skin: Negative for itching and rash.  Neurological: Positive for dizziness, weakness and headaches. Negative for focal weakness.  Endo/Heme/Allergies: Does not bruise/bleed easily.  Psychiatric/Behavioral: Positive for depression. The patient is nervous/anxious.      Objective:   Vitals:   02/14/19 1057 02/14/19 1100  BP:  114/80  Pulse:  97  Temp: 97.7 F (36.5 C)   TempSrc: Temporal   SpO2:  96%  Weight: (!) 330 lb 12.8 oz (150 kg)   Height: _0  (1.753 m)    96% on  RA BMI Readings from Last 3 Encounters:  02/14/19 48.85 kg/m  12/08/18 41.59 kg/m  10/18/18 44.30 kg/m   Wt Readings from Last 3 Encounters:  02/14/19 (!) 330 lb 12.8 oz (150 kg)  12/08/18 281 lb 9.6 oz (127.7 kg)    10/18/18 300 lb (136.1 kg)    Physical Exam Vitals signs reviewed.  Constitutional:      General: He is not in acute distress.    Appearance: He is obese. He is not diaphoretic.  HENT:     Head: Normocephalic and atraumatic.     Nose:     Comments: Deferred due to masking requirement.    Mouth/Throat:     Comments: Deferred due to masking requirement. Eyes:     General: No scleral icterus. Neck:     Musculoskeletal: Neck supple.  Cardiovascular:     Rate and Rhythm: Normal rate and regular rhythm.     Heart sounds: No murmur.  Pulmonary:     Comments: Breathing comfortably on room air, occasionally truncated speech due to dyspnea.  Clear to auscultation bilaterally, but decreased in bilateral bases Abdominal:     General: There is no distension.     Palpations: Abdomen is soft.     Tenderness: There is no abdominal tenderness.     Comments: Left lower quadrant hernia-nontender  Musculoskeletal:        General: No swelling or deformity.  Lymphadenopathy:     Cervical: No cervical adenopathy.  Skin:    General: Skin is warm and dry.     Comments: Scaling rash behind his ears  Neurological:     Mental Status: He is alert.     Motor: No weakness.     Coordination: Coordination normal.  Psychiatric:        Mood and Affect: Mood normal.        Behavior: Behavior normal.      CBC    Component Value Date/Time   WBC 8.5 10/08/2018 1407   WBC 11.1 (H) 11/16/2017 0042   RBC 4.99 10/08/2018 1407   RBC 4.83 11/16/2017 0042   HGB 14.0 10/08/2018 1407  HCT 44.0 10/08/2018 1407   PLT 320 10/08/2018 1407   MCV 88 10/08/2018 1407   MCH 28.1 10/08/2018 1407   MCH 31.1 11/16/2017 0042   MCHC 31.8 10/08/2018 1407   MCHC 34.4 11/16/2017 0042   RDW 14.3 10/08/2018 1407   LYMPHSABS 1.6 11/16/2017 0042   MONOABS 0.7 11/16/2017 0042   EOSABS 0.1 11/16/2017 0042   BASOSABS 0.0 11/16/2017 0042    CMP Latest Ref Rng & Units 10/08/2018 11/16/2017 10/06/2017  Glucose 65 - 99  mg/dL 109(H) 133(H) 148(H)  BUN 6 - 24 mg/dL 22 25(H) 29(H)  Creatinine 0.76 - 1.27 mg/dL 0.77 0.90 1.04  Sodium 134 - 144 mmol/L 137 138 138  Potassium 3.5 - 5.2 mmol/L 4.4 3.5 3.8  Chloride 96 - 106 mmol/L 100 101 100  CO2 20 - 29 mmol/L 21 - 27  Calcium 8.7 - 10.2 mg/dL 9.0 - 9.5  Total Protein 6.0 - 8.5 g/dL 6.9 - -  Total Bilirubin 0.0 - 1.2 mg/dL <0.2 - -  Alkaline Phos 39 - 117 IU/L 46 - -  AST 0 - 40 IU/L 18 - -  ALT 0 - 44 IU/L 16 - -     Chest Imaging- films reviewed: CXR, 2 view 11/15/2017-right hemidiaphragm eventration, prominent right heart, normal lungs.  Pulmonary Functions Testing Results: No flowsheet data found.   PSG 10/27/2017-OSA, titrated to BiPAP 19/15 cm H2O when wearing full facemask  Echocardiogram 12/2016: - Left ventricle: The cavity size was normal. Wall thickness wasincreased in a pattern of mild LVH. Systolic function was normal. The estimated ejection fraction was in the range of 55% to 60%.Wall motion was normal; there were no regional wall motion abnormalities. Doppler parameters are consistent with abnormalleft ventricular relaxation (grade 1 diastolic dysfunction). - Mitral valve: Calcified annulus. - Left atrium: The atrium was mildly dilated. - Atrial septum: No defect or patent foramen ovale was identified.  Echocardiogram 02/04/2019: LVEF 60 to 65%, mild LVH, diastolic dysfunction.  Normal LA, RV, RA.  Normal valves.  Exercise tolerance test 12/2016:  The patient walked for 6:05 of a Bruce protocol. Peak HR of 151 which is 86% predicted maximal HR .  There were no ST or T wave changes to suggest ischemia  BP response was normal  Negative GXT       Assessment & Plan:     ICD-10-CM   1. SOB (shortness of breath)  R06.02 Pulmonary function test    tiotropium (SPIRIVA) 18 MCG inhalation capsule    albuterol (VENTOLIN HFA) 108 (90 Base) MCG/ACT inhaler  2. Morbid obesity ([place])  E66.01   3. 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 History of cigarette smoking  Z87.891 Pulmonary function test    tiotropium (SPIRIVA) 18 MCG inhalation capsule    albuterol (VENTOLIN HFA) 108 (90 Base) MCG/ACT inhaler   Shortness of breath, prominent dyspnea on exertion- Likely multifactorial.  I worry with his smoking and vaping history that he could have COPD, and hopefully not vaping induced lung injury.  He is at risk for OHS. -Recommend stopping vaping-- he plans to work on quitting vaping gradually over the next week and will use up his current supply of pods without purchasing more. -Recommend weight loss as a long-term goal -Recommend increased physical activity to regain endurance.  He may benefit from pulmonary rehab. -Due to suspicion for COPD, starting Spiriva once daily.  Inhaler training provided. -  Albuterol every 4 hours as needed -Up-to-date on seasonal flu vaccine.  Prevnar 13 today.  Up-to-date on Pneumovax Pneumovax 23. -PFTs -Depending on his response to treatment and PFT results, may require advanced imaging to evaluate for vaping induced lung injury.  He has a CTA chest previously ordered by another provider.  HFpEF -Recommend optimizing sleep apnea, hypertension, weight loss  Morbid obesity, placing him at risk for OHS and restrictive lung disease. -Recommend weight loss -Discussed dietary changes and lifestyle changes that are required for long-term successful weight loss.  OSA -Referral to sleep medicine -Continue with current CPAP   RTC in 4 to 6 weeks after PFTs.   Current Outpatient Medications:    amLODipine (NORVASC) 10 MG tablet, Take 1 tablet (10 mg total) by mouth daily., Disp: 30 tablet, Rfl: 6   atorvastatin (LIPITOR) 20 MG tablet, Take 1 tablet (20 mg total) by mouth daily., Disp: 90 tablet, Rfl: 3   Blood Glucose Monitoring Suppl (TRUE METRIX METER) w/Device KIT, Use as directed, Disp:  1 kit, Rfl: 0   glucose blood (TRUE METRIX BLOOD GLUCOSE TEST) test strip, Use as instructed, Disp: 100 each, Rfl: 12   hydrochlorothiazide (MICROZIDE) 12.5 MG capsule, TAKE 1 TABLET (12.5 MG TOTAL) BY MOUTH DAILY., Disp: 30 capsule, Rfl: 1   losartan (COZAAR) 100 MG tablet, Take 1 tablet (100 mg total) by mouth daily., Disp: 30 tablet, Rfl: 5   metFORMIN (GLUCOPHAGE) 500 MG tablet, TAKE 1 TABLET (500 MG TOTAL) BY MOUTH 2 (TWO) TIMES DAILY WITH A MEAL., Disp: 60 tablet, Rfl: 2   sertraline (ZOLOFT) 100 MG tablet, Take 1.5 tablets (150 mg total) by mouth daily., Disp: 45 tablet, Rfl: 2   traZODone (DESYREL) 100 MG tablet, Take 1 tablet (100 mg total) by mouth at bedtime as needed for sleep., Disp: 30 tablet, Rfl: 2   TRUEPLUS LANCETS 28G MISC, Use as directed, Disp: 100 each, Rfl: 2   albuterol (VENTOLIN HFA) 108 (90 Base) MCG/ACT inhaler, Inhale 2 puffs into the lungs every 6 (six) hours as needed for wheezing or shortness of breath., Disp: 6.7 g, Rfl: 11   tiotropium (SPIRIVA) 18 MCG inhalation capsule, Place 1 capsule (18 mcg total) into inhaler and inhale daily., Disp: 30 capsule, Rfl: 6   Julian Hy, DO [facility] 02/14/2019 3:48 PM

## 2019-02-14 NOTE — Patient Instructions (Addendum)
Thank you for visiting Dr. Carlis Abbott at [facility]. We recommend the following: Orders Placed This Encounter  Procedures  . Pulmonary function test   Orders Placed This Encounter  Procedures  . Pulmonary function test    Standing Status:   Future    Standing Expiration Date:   02/14/2020    Order Specific Question:   Where should this test be performed?    Answer:   [facility]    Order Specific Question:   Full PFT: includes the following: basic spirometry, spirometry pre & post bronchodilator, diffusion capacity (DLCO), lung volumes    Answer:   Full PFT    Meds ordered this encounter  Medications  . tiotropium (SPIRIVA) 18 MCG inhalation capsule    Sig: Place 1 capsule (18 mcg total) into inhaler and inhale daily.    Dispense:  30 capsule    Refill:  6  . albuterol (VENTOLIN HFA) 108 (90 Base) MCG/ACT inhaler    Sig: Inhale 2 puffs into the lungs every 6 (six) hours as needed for wheezing or shortness of breath.    Dispense:  6.7 g    Refill:  11      Return in about 1 month (around 03/16/2019).    Please do your part to reduce the spread of COVID-19.   It is very important that you stop smoking or vaping. This is the single most important thing that you can do to improve your lung health.   S = Set a quit date. T = Tell family, friends, and the people around you that you plan to quit. A = Anticipate or plan ahead for the tough times you'll face while quitting. R = Remove cigarettes and other tobacco products from your home, car, and work. T = Talk to [name] about getting help to quit.  If you need help, please reach out to our office or the smoking cessation resources available: [place] [employer]oking Cessation Class: OW:817674 1-800-QUIT-NOW www.BeTobaccoFree.gov

## 2019-02-15 MED FILL — ?ATORVASTATIN 20 MG TABLET: 20 | 30 days supply | Qty: 30 | Fill #2

## 2019-02-15 MED FILL — ?AMLODIPINE BESYLATE 10 MG: 10 | 30 days supply | Qty: 30 | Fill #2

## 2019-02-15 MED FILL — ?METFORMIN HCL 500MG TABLET: 500 | 30 days supply | Qty: 60 | Fill #1

## 2019-02-21 ENCOUNTER — Other Ambulatory Visit: Payer: Self-pay | Admitting: Internal Medicine

## 2019-02-21 DIAGNOSIS — I10 Essential (primary) hypertension: Secondary | ICD-10-CM

## 2019-02-21 MED FILL — ?HYDROCHLOROTHIAZIDE 12.5M: 12.5 | 30 days supply | Qty: 30 | Fill #1

## 2019-02-22 MED FILL — LOSARTAN POTASSIUM 100 MG T: 100 | 30 days supply | Qty: 30 | Fill #0

## 2019-03-01 ENCOUNTER — Ambulatory Visit (INDEPENDENT_AMBULATORY_CARE_PROVIDER_SITE_OTHER): Payer: No Typology Code available for payment source | Admitting: Psychiatry

## 2019-03-01 ENCOUNTER — Other Ambulatory Visit: Payer: Self-pay

## 2019-03-01 DIAGNOSIS — F320 Major depressive disorder, single episode, mild: Secondary | ICD-10-CM

## 2019-03-01 DIAGNOSIS — F411 Generalized anxiety disorder: Secondary | ICD-10-CM

## 2019-03-01 DIAGNOSIS — F32A Depression, unspecified: Secondary | ICD-10-CM

## 2019-03-01 MED ORDER — TRAZODONE HCL 100 MG PO TABS: 100.0000 mg | ORAL_TABLET | Freq: Every evening | ORAL | 0 refills | Status: DC | PRN

## 2019-03-01 MED ORDER — SERTRALINE HCL 100 MG PO TABS: 150.0000 mg | ORAL_TABLET | Freq: Every day | ORAL | 0 refills | Status: DC

## 2019-03-01 NOTE — Progress Notes (Signed)
BH MD/PA/NP OP Progress Note  03/01/2019 1:36 PM Juan Kline  MRN:  [id number] Interview was conducted by phone and I verified that I was speaking with the correct person using two identifiers. I discussed the limitations of evaluation and management by telemedicine and  the availability of in person appointments. Patient expressed understanding and agreed to proceed.  Chief Complaint: "I've been feeling pretty good".  HPI: 48 yo single male with approximately 2 year hx of increased anxiety, some situational depression and occasional spells/episodes of shaking, tunnel vision, SOB, dizziness, lightheadedness.e have been occurring frequently months ago (up to several in a month) but now are much less frequent. He reports feeling dizzy often but the above mentioned "spells" have not occurred recently (in weeks). Patient has been seen over past year by Dr. Charlotte Crumb and has had brain CT and MRI as well as EEG done - all basically normal. Patient's depressed mood was originally triggered by ultimatum given to him by mother/step-father last year that he either finds a job or will have to move out of their home. He works at a [employer] part time but financial stress is still present. He has been considering applying for disability as he feels physically challenging to do his job. He has never felt suicidal and has no hx of inpatient psychiatric admissions. He denies having history of mania or psychosis. He has been taking sertraline since June of 2019 - we increased the dose few months ago and he is currently at 150 mg. He tolerates it well and feels that his anxiety has subsided. Prior to sertraline he has tried escitalopram - does not recall if it was helpful. He reports having good appetite and adequate sleep with trazodone (he has OSA and uses CPAP). while sleep is interrupted. Gurnie admits to a hx of alcohol use disorder but quit on his own 4 years ago. He denies abusing street drugs now or in the past.    Visit Diagnosis:    ICD-10-CM   1. GAD (generalized anxiety disorder)  F41.1   2. Mild depression (HCC)  F32.0 sertraline (ZOLOFT) 100 MG tablet    Past Psychiatric History: Please see intake h&P.  Past Medical History:  Past Medical History:  Diagnosis Date  . Chronic foot ulcer ([place]) admitted 03/02/2014   medial border first metatarsal left foot  . Diabetes ([place])   . Diabetic foot infection ([place])   . Hyperlipidemia   . Hypertension Dx 2009  . OSA (obstructive sleep apnea)    CPAP  . Osteomyelitis left first metatarsal 03/02/2014  . Sleep apnea   . Venous stasis ulcer of left lower extremity ([place])    [name] 03/02/2014    Past Surgical History:  Procedure Laterality Date  . NO PAST SURGERIES      Family Psychiatric History: None.  Family History:  Family History  Problem Relation Age of Onset  . Hypertension Mother   . Hyperlipidemia Mother   . Diabetes Mother   . Kidney disease Mother   . Hypertension Father   . Hyperlipidemia Father   . Diabetes Brother   . Hypertension Brother   . Hyperlipidemia Brother   . Heart disease Maternal Grandmother   . COPD Maternal Grandmother   . Diabetes Maternal Grandfather   . Heart attack Paternal Grandmother   . Heart disease Paternal Grandfather     Social History:  Social History   Socioeconomic History  . Marital status: Single    Spouse name: Not on file  .  Number of children: 0  . Years of education: Not on file  . Highest education level: Not on file  Occupational History  . Not on file  Social Needs  . Financial resource strain: Not on file  . Food insecurity    Worry: Not on file    Inability: Not on file  . Transportation needs    Medical: Not on file    Non-medical: Not on file  Tobacco Use  . Smoking status: Current Some Day Smoker    Packs/day: 1.50    Years: 26.00    Pack years: 39.00    Types: E-cigarettes, Cigarettes    Start date: 1989    Last attempt to quit: 02/16/2014    Years  since quitting: 5.0  . Smokeless tobacco: Never Used  Substance and Sexual Activity  . Alcohol use: Yes    Comment: 03/02/2014 "I rarely drink"  . Drug use: No  . Sexual activity: Not Currently  Lifestyle  . Physical activity    Days per week: Not on file    Minutes per session: Not on file  . Stress: Not on file  Relationships  . Social Herbalist on phone: Not on file    Gets together: Not on file    Attends religious service: Not on file    Active member of club or organization: Not on file    Attends meetings of clubs or organizations: Not on file    Relationship status: Not on file  Other Topics Concern  . Not on file  Social History Narrative   Pt lives in 1 story home with his mother and step father   9th grade education   Last occupation was with [employer] in 2009      Unemployed at this time- thinking of filing for disability      Right handed       Allergies:  Allergies  Allergen Reactions  . Lyrica [Pregabalin] Anxiety    Metabolic Disorder Labs: Lab Results  Component Value Date   HGBA1C 6.2 (H) 07/01/2018   MPG 134 (H) 03/03/2014   No results found for: PROLACTIN Lab Results  Component Value Date   CHOL 285 (H) 12/10/2016   TRIG 247 (H) 12/10/2016   HDL 73 12/10/2016   CHOLHDL 3.9 12/10/2016   VLDL 65 (H) 02/28/2016   LDLCALC 163 (H) 12/10/2016   LDLCALC 124 (H) 02/28/2016   Lab Results  Component Value Date   TSH 4.170 08/13/2017   TSH 2.310 09/26/2016    Therapeutic Level Labs: No results found for: LITHIUM No results found for: VALPROATE No components found for:  CBMZ  Current Medications: Current Outpatient Medications  Medication Sig Dispense Refill  . albuterol (VENTOLIN HFA) 108 (90 Base) MCG/ACT inhaler Inhale 2 puffs into the lungs every 6 (six) hours as needed for wheezing or shortness of breath. 6.7 g 11  . amLODipine (NORVASC) 10 MG tablet Take 1 tablet (10 mg total) by mouth daily. 30 tablet 6  . atorvastatin  (LIPITOR) 20 MG tablet Take 1 tablet (20 mg total) by mouth daily. 90 tablet 3  . Blood Glucose Monitoring Suppl (TRUE METRIX METER) w/Device KIT Use as directed 1 kit 0  . glucose blood (TRUE METRIX BLOOD GLUCOSE TEST) test strip Use as instructed 100 each 12  . hydrochlorothiazide (MICROZIDE) 12.5 MG capsule TAKE 1 TABLET (12.5 MG TOTAL) BY MOUTH DAILY. 30 capsule 1  . losartan (COZAAR) 100 MG tablet TAKE 1 TABLET BY  MOUTH DAILY. 30 tablet 0  . metFORMIN (GLUCOPHAGE) 500 MG tablet TAKE 1 TABLET (500 MG TOTAL) BY MOUTH 2 (TWO) TIMES DAILY WITH A MEAL. 60 tablet 2  . sertraline (ZOLOFT) 100 MG tablet Take 1.5 tablets (150 mg total) by mouth daily. 135 tablet 0  . tiotropium (SPIRIVA) 18 MCG inhalation capsule Place 1 capsule (18 mcg total) into inhaler and inhale daily. 30 capsule 6  . traZODone (DESYREL) 100 MG tablet Take 1 tablet (100 mg total) by mouth at bedtime as needed for sleep. 90 tablet 0  . TRUEPLUS LANCETS 28G MISC Use as directed 100 each 2   No current facility-administered medications for this visit.       Psychiatric Specialty Exam: Review of Systems  Respiratory: Positive for shortness of breath.   Gastrointestinal: Positive for heartburn.  All other systems reviewed and are negative.   There were no vitals taken for this visit.There is no height or weight on file to calculate BMI.  General Appearance: NA  Eye Contact:  NA  Speech:  Clear and Coherent and Normal Rate  Volume:  Normal  Mood:  Euthymic  Affect:  NA  Thought Process:  Goal Directed and Linear  Orientation:  Full (Time, Place, and Person)  Thought Content: Logical   Suicidal Thoughts:  No  Homicidal Thoughts:  No  Memory:  Immediate;   Good Recent;   Good Remote;   Good  Judgement:  Fair  Insight:  Fair  Psychomotor Activity:  NA  Concentration:  Concentration: Good  Recall:  Good  Fund of Knowledge: Good  Language: Good  Akathisia:  Negative  Handed:  Right  AIMS (if indicated): not done   Assets:  Communication Skills Desire for Improvement Housing Resilience  ADL's:  Intact  Cognition: WNL  Sleep:  Fair   Screenings: GAD-7     Office Visit from 11/26/2018 in [place] Office Visit from 07/01/2018 in [facility] Office Visit from 05/21/2018 in [facility] Office Visit from 04/13/2018 in [facility] Visit from 04/01/2018 in [facility]  Total GAD-7 Score  0  0  0  3  11    PHQ2-9     Office Visit from 11/26/2018 in [facility] Office Visit from 07/01/2018 in [facility] Office Visit from 05/21/2018 in [facility] from [facility] Visit from 04/13/2018 in [facility]  PHQ-2 Total Score  0  0  0  0  2  PHQ-9 Total Score  -  2  1  -  8       Assessment and Plan: 48 yo single male with approximately 2 year hx of increased anxiety, some situational depression and occasional spells/episodes of shaking, tunnel vision, SOB, dizziness, lightheadedness.e have been occurring frequently months ago (up to several in a month) but now are much less frequent. He reports feeling dizzy often but the above mentioned "spells" have not occurred recently (in weeks). Patient has been seen over past year by Dr. Charlotte Crumb and has had brain CT and MRI as well as EEG done - all basically normal. Patient's depressed mood was originally triggered by ultimatum given to him by mother/step-father last year that he either finds a job or will have to move out of their  home. He works at a [employer] part time but financial stress is still present. He has been considering applying for disability as he feels physically challenging to do his job. He has never felt suicidal and has no hx of  inpatient psychiatric admissions. He denies having history of mania or psychosis. He has been taking sertraline since June of 2019 - we increased the dose few months ago and he is currently at 150 mg. He tolerates it well and feels that his anxiety has subsided. Prior to sertraline he has tried escitalopram - does not recall if it was helpful. He reports having good appetite and adequate sleep with trazodone (he has OSA and uses CPAP). while sleep is interrupted. Newt admits to a hx of alcohol use disorder but quit on his own 4 years ago. He denies abusing street drugs now or in the past.   Dx: Generalized anxiety disorder; Depressive disorder unspecified; Alcohol use disorder in remission  Plan: Continue sertraline 150 mg daily and trazodone 100 mg prn middle insomnia. Next appointment in 2 months.The plan was discussed with patient who had an opportunity to ask questions and these were all answered. I spend13mnutes in phone consultation withthe patient.    OStephanie Acre MD 03/01/2019, 1:36 PM

## 2019-03-02 MED FILL — traZODone HCL 100 MG TABS: 100 | 30 days supply | Qty: 30 | Fill #0

## 2019-03-03 ENCOUNTER — Ambulatory Visit: Payer: Self-pay | Attending: Internal Medicine | Admitting: Internal Medicine

## 2019-03-03 ENCOUNTER — Encounter: Payer: Self-pay | Admitting: Internal Medicine

## 2019-03-03 ENCOUNTER — Other Ambulatory Visit: Payer: Self-pay

## 2019-03-03 DIAGNOSIS — E1169 Type 2 diabetes mellitus with other specified complication: Secondary | ICD-10-CM | POA: Insufficient documentation

## 2019-03-03 DIAGNOSIS — R0602 Shortness of breath: Secondary | ICD-10-CM

## 2019-03-03 DIAGNOSIS — E1142 Type 2 diabetes mellitus with diabetic polyneuropathy: Secondary | ICD-10-CM

## 2019-03-03 DIAGNOSIS — I10 Essential (primary) hypertension: Secondary | ICD-10-CM

## 2019-03-03 DIAGNOSIS — F411 Generalized anxiety disorder: Secondary | ICD-10-CM

## 2019-03-03 DIAGNOSIS — E785 Hyperlipidemia, unspecified: Secondary | ICD-10-CM

## 2019-03-03 DIAGNOSIS — F17290 Nicotine dependence, other tobacco product, uncomplicated: Secondary | ICD-10-CM

## 2019-03-03 DIAGNOSIS — Z6841 Body Mass Index (BMI) 40.0 and over, adult: Secondary | ICD-10-CM

## 2019-03-03 DIAGNOSIS — Z720 Tobacco use: Secondary | ICD-10-CM

## 2019-03-03 MED ORDER — LOSARTAN POTASSIUM 100 MG PO TABS: 100.0000 mg | ORAL_TABLET | Freq: Every day | ORAL | 6 refills | Status: DC

## 2019-03-03 MED ORDER — AMLODIPINE BESYLATE 10 MG PO TABS: 10.0000 mg | ORAL_TABLET | Freq: Every day | ORAL | 6 refills | Status: DC

## 2019-03-03 MED ORDER — HYDROCHLOROTHIAZIDE 12.5 MG PO CAPS: 12.5000 mg | ORAL_CAPSULE | Freq: Every day | ORAL | 6 refills | Status: DC

## 2019-03-03 MED ORDER — METFORMIN HCL 500 MG PO TABS: 500.0000 mg | ORAL_TABLET | Freq: Two times a day (BID) | ORAL | 6 refills | Status: DC

## 2019-03-03 NOTE — Progress Notes (Signed)
Virtual Visit via Telephone Note Due to current restrictions/limitations of in-office visits due to the COVID-19 pandemic, this scheduled clinical appointment was converted to a telehealth visit  I connected with Juan Kline on 03/03/19 at 11:44 a.m by telephone and verified that I am speaking with the correct person using two identifiers. I am in my office.  The patient is at home.  Only the patient and myself participated in this encounter.  I discussed the limitations, risks, security and privacy concerns of performing an evaluation and management service by telephone and the availability of in person appointments. I also discussed with the patient that there may be a patient responsible charge related to this service. The patient expressed understanding and agreed to proceed.   History of Present Illness: HTN, HL,DM type 2 with neuropathy,anxiety, chronic fatigue, severe OSA, vapes   Anxiety: followed by Dr. Montel Culver since last visit.  Dose Zoloft increase and Trazodone added.  Overall he feels he is doing okay on the current medications  CP/SOB: Since last visit with me, he did get into see cardiology.  Assessment was that chest pain was atypical and likely related to anxiety.  Repeat echo was done which revealed good heart function.  They recommend that he see pulmonary.  He did see Dr. Carlis Abbott.  She question whether some of his symptoms may be due to COPD and started him on Spiriva and albuterol.  Patient advised to stop vaping, to lose weight and order placed for PFTs which is scheduled for 03/21/2019.  Trying to taper himself from vaping. Spiriva seems to be helping.  DM: checking BS 1-2 x a wk.  Range 120-140. "I have a big appetite like I can't get enough.  I feel hungry a lot."  He has gained all wgh back.  He was 297 lbs in 06/2018, now in 320s or 330 lb.  Some days he eats dinner at 9-10 p.m after work.  Works 2-4 days a wk.  Saw nutritionist in past.  He would like to see the  nutritionist again. -taking Metformin -over due for eye exam  HTN:  He has not check BP at home in a while.  BP at cardiology and pulmonary visits were good.  Limits salt in foods  Compliant with meds - HCTZ, Cozaar and Norvasc  HL:  Taking and tolerating Lipitor Current Outpatient Medications on File Prior to Visit  Medication Sig Dispense Refill  . albuterol (VENTOLIN HFA) 108 (90 Base) MCG/ACT inhaler Inhale 2 puffs into the lungs every 6 (six) hours as needed for wheezing or shortness of breath. 6.7 g 11  . amLODipine (NORVASC) 10 MG tablet Take 1 tablet (10 mg total) by mouth daily. 30 tablet 6  . atorvastatin (LIPITOR) 20 MG tablet Take 1 tablet (20 mg total) by mouth daily. 90 tablet 3  . Blood Glucose Monitoring Suppl (TRUE METRIX METER) w/Device KIT Use as directed 1 kit 0  . glucose blood (TRUE METRIX BLOOD GLUCOSE TEST) test strip Use as instructed 100 each 12  . hydrochlorothiazide (MICROZIDE) 12.5 MG capsule TAKE 1 TABLET (12.5 MG TOTAL) BY MOUTH DAILY. 30 capsule 1  . losartan (COZAAR) 100 MG tablet TAKE 1 TABLET BY MOUTH DAILY. 30 tablet 0  . metFORMIN (GLUCOPHAGE) 500 MG tablet TAKE 1 TABLET (500 MG TOTAL) BY MOUTH 2 (TWO) TIMES DAILY WITH A MEAL. 60 tablet 2  . sertraline (ZOLOFT) 100 MG tablet Take 1.5 tablets (150 mg total) by mouth daily. 135 tablet 0  . tiotropium (SPIRIVA)  18 MCG inhalation capsule Place 1 capsule (18 mcg total) into inhaler and inhale daily. 30 capsule 6  . traZODone (DESYREL) 100 MG tablet Take 1 tablet (100 mg total) by mouth at bedtime as needed for sleep. 90 tablet 0  . TRUEPLUS LANCETS 28G MISC Use as directed 100 each 2   No current facility-administered medications on file prior to visit.     Observations/Objective: No direct observation done as this was a telephone encounter.  Assessment and Plan: 1. Type 2 diabetes mellitus with diabetic polyneuropathy, without long-term current use of insulin (HCC) Dietary counseling given.  Patient  feels that he is unable to afford to eat as healthy as he would like to.  However we discussed simple measures that he can take including eliminating sugary drinks from the diet, decreasing his portion size and cutting back on white carbohydrates.  He can also take his dinner with him to work so that he eats it on his break instead of eating 10 PM at night. Advised to try to get his eye exam done. - Hemoglobin A1c(blood test); Future - Microalbumin/Creatinine Ratio, Urine; Future - Lipid panel; Future - metFORMIN (GLUCOPHAGE) 500 MG tablet; Take 1 tablet (500 mg total) by mouth 2 (two) times daily with a meal.  Dispense: 60 tablet; Refill: 6 - Amb ref to Medical Nutrition Therapy-MNT  2. Essential hypertension Blood pressure at recent specialty visits have been okay.  He will continue current medications and low-salt diet - amLODipine (NORVASC) 10 MG tablet; Take 1 tablet (10 mg total) by mouth daily.  Dispense: 30 tablet; Refill: 6 - losartan (COZAAR) 100 MG tablet; Take 1 tablet (100 mg total) by mouth daily.  Dispense: 30 tablet; Refill: 6 - hydrochlorothiazide (MICROZIDE) 12.5 MG capsule; Take 1 capsule (12.5 mg total) by mouth daily.  Dispense: 30 capsule; Refill: 6  3. Class 3 severe obesity due to excess calories without serious comorbidity with body mass index (BMI) of 45.0 to 49.9 in adult [facility]) See #1 above - Amb ref to Medical Nutrition Therapy-MNT  4. Hyperlipidemia associated with type 2 diabetes mellitus (HCC) Continue Lipitor  5. GAD (generalized anxiety disorder) Followed by psychiatry.  Doing okay on increased dose of Zoloft and trazodone at bedtime  6. Vapes nicotine containing substance Advised to quit.  Discussed acute lung disease that can occur.  Patient states he is weaning himself off.  Less than 5 minutes spent on counseling  7. SOB (shortness of breath) Currently being worked up by pulmonary.   Follow Up Instructions: F/u in 4 mths   I discussed the  assessment and treatment plan with the patient. The patient was provided an opportunity to ask questions and all were answered. The patient agreed with the plan and demonstrated an understanding of the instructions.   The patient was advised to call back or seek an in-person evaluation if the symptoms worsen or if the condition fails to improve as anticipated.  I provided 17 minutes of non-face-to-face time during this encounter.   Karle Plumber, MD

## 2019-03-04 ENCOUNTER — Other Ambulatory Visit: Payer: Self-pay

## 2019-03-04 ENCOUNTER — Ambulatory Visit: Payer: Self-pay | Attending: Family Medicine

## 2019-03-04 DIAGNOSIS — E1142 Type 2 diabetes mellitus with diabetic polyneuropathy: Secondary | ICD-10-CM

## 2019-03-04 DIAGNOSIS — E785 Hyperlipidemia, unspecified: Secondary | ICD-10-CM

## 2019-03-05 LAB — LIPID PANEL
Chol/HDL Ratio: 4.3 ratio (ref 0.0–5.0)
Cholesterol, Total: 200 mg/dL — ABNORMAL HIGH (ref 100–199)
HDL: 46 mg/dL (ref >39)
LDL Chol Calc (NIH): 110 mg/dL — ABNORMAL HIGH (ref 0–99)
Triglycerides: 255 mg/dL — ABNORMAL HIGH (ref 0–149)
VLDL Cholesterol Cal: 44 mg/dL — ABNORMAL HIGH (ref 5–40)

## 2019-03-05 LAB — HEMOGLOBIN A1C
Est. average glucose Bld gHb Est-mCnc: 143 mg/dL
Hgb A1c MFr Bld: 6.6 % — ABNORMAL HIGH (ref 4.8–5.6)

## 2019-03-05 LAB — MICROALBUMIN / CREATININE URINE RATIO
Creatinine, Urine: 109.3 mg/dL
Microalb/Creat Ratio: 219 mg/g creat — ABNORMAL HIGH (ref 0–29)
Microalbumin, Urine: 239.7 ug/mL

## 2019-03-06 ENCOUNTER — Other Ambulatory Visit: Payer: Self-pay | Admitting: Internal Medicine

## 2019-03-06 ENCOUNTER — Encounter: Payer: Self-pay | Admitting: Internal Medicine

## 2019-03-06 DIAGNOSIS — R809 Proteinuria, unspecified: Secondary | ICD-10-CM | POA: Insufficient documentation

## 2019-03-06 MED ORDER — ATORVASTATIN CALCIUM 40 MG PO TABS: 40.0000 mg | ORAL_TABLET | Freq: Every day | ORAL | 3 refills | Status: DC

## 2019-03-07 MED FILL — SERTRALINE HCL 100 MG TAB: 100 | 30 days supply | Qty: 45 | Fill #2

## 2019-03-07 MED FILL — ATORVASTATIN 40 MG TABLET: 40 | 30 days supply | Qty: 30 | Fill #0

## 2019-03-14 ENCOUNTER — Ambulatory Visit (INDEPENDENT_AMBULATORY_CARE_PROVIDER_SITE_OTHER): Payer: No Typology Code available for payment source | Admitting: Internal Medicine

## 2019-03-14 ENCOUNTER — Encounter: Payer: Self-pay | Admitting: Internal Medicine

## 2019-03-14 ENCOUNTER — Other Ambulatory Visit: Payer: Self-pay

## 2019-03-14 VITALS — BP 126/87 | HR 88 | Temp 97.0°F | Ht 69.0 in | Wt 333.0 lb

## 2019-03-14 DIAGNOSIS — F419 Anxiety disorder, unspecified: Secondary | ICD-10-CM

## 2019-03-14 DIAGNOSIS — I10 Essential (primary) hypertension: Secondary | ICD-10-CM

## 2019-03-14 DIAGNOSIS — R0602 Shortness of breath: Secondary | ICD-10-CM

## 2019-03-14 MED ORDER — LOSARTAN POTASSIUM-HCTZ 100-12.5 MG PO TABS: 1.0000 | ORAL_TABLET | Freq: Every day | ORAL | 3 refills | Status: DC

## 2019-03-14 NOTE — Progress Notes (Signed)
OFFICE NOTE  Chief Complaint:  Follow-up  Primary Care Physician: Juan Pier, MD  HPI:  Juan Kline is a 48 y.o. male with a past medial history significant for hypertension, venous stasis ulcers and osteomyelitis of the first left metatarsal, who recently was in the emergency department with chest pain and uncontrolled hypertension. He said he knew he had hypertension for more than 10 years. He's not been on any medications. He said he had a sleep study number of years ago but was not diagnosed with OSA, but did not follow-up with the study. He was seen 2 other times in the emergency department the summer for chest pain and hypertension as well. Recently saw Juan Ransom, PA-C, who ordered an exercise tolerance test which was negative. The patient exercised for 6 minutes on the Bruce protocol to max predicted heart rate of 86%. Normal blood pressure response and negative for ischemia. He also had an echocardiogram which demonstrated an EF of 55-60%, normal wall motion and grade 1 diastolic dysfunction with mild left atrial enlargement. He reports she's had no further chest pain symptoms since then.  03/14/2019  Juan Kline is seen today in follow-up.  Overall he feels well.  His blood pressure is now much better controlled at 126/87.  He is in fact had several visits demonstrating good blood pressure control.  He was seen recently by Juan Lofts, PA-C, who noted that he had some shortness of breath.  An echo was ordered which showed mild diastolic dysfunction and normal systolic function.  He is scheduled for some PFTs coming up next week.  I suspect weight is a big factor in his shortness of breath.  PMHx:  Past Medical History:  Diagnosis Date  . Chronic foot ulcer ([place]) admitted 03/02/2014   medial border first metatarsal left foot  . Diabetes ([place])   . Diabetic foot infection ([place])   . Hyperlipidemia   . Hypertension Dx 2009  . OSA (obstructive sleep apnea)    CPAP  .  Osteomyelitis left first metatarsal 03/02/2014  . Sleep apnea   . Venous stasis ulcer of left lower extremity ([place])    Juan Kline 03/02/2014    Past Surgical History:  Procedure Laterality Date  . NO PAST SURGERIES      FAMHx:  Family History  Problem Relation Age of Onset  . Hypertension Mother   . Hyperlipidemia Mother   . Diabetes Mother   . Kidney disease Mother   . Hypertension Father   . Hyperlipidemia Father   . Diabetes Brother   . Hypertension Brother   . Hyperlipidemia Brother   . Heart disease Maternal Grandmother   . COPD Maternal Grandmother   . Diabetes Maternal Grandfather   . Heart attack Paternal Grandmother   . Heart disease Paternal Grandfather     SOCHx:   reports that he has been smoking e-cigarettes and cigarettes. He started smoking about 31 years ago. He has a 39.00 pack-year smoking history. He has never used smokeless tobacco. He reports current alcohol use. He reports that he does not use drugs.  ALLERGIES:  Allergies  Allergen Reactions  . Lyrica [Pregabalin] Anxiety    ROS: Pertinent items noted in HPI and remainder of comprehensive ROS otherwise negative.  HOME MEDS: Current Outpatient Medications on File Prior to Visit  Medication Sig Dispense Refill  . albuterol (VENTOLIN HFA) 108 (90 Base) MCG/ACT inhaler Inhale 2 puffs into the lungs every 6 (six) hours as needed for wheezing or shortness of breath.  6.7 g 11  . amLODipine (NORVASC) 10 MG tablet Take 1 tablet (10 mg total) by mouth daily. 30 tablet 6  . atorvastatin (LIPITOR) 40 MG tablet Take 1 tablet (40 mg total) by mouth daily. 90 tablet 3  . Blood Glucose Monitoring Suppl (TRUE METRIX METER) w/Device KIT Use as directed 1 kit 0  . glucose blood (TRUE METRIX BLOOD GLUCOSE TEST) test strip Use as instructed 100 each 12  . hydrochlorothiazide (MICROZIDE) 12.5 MG capsule Take 1 capsule (12.5 mg total) by mouth daily. 30 capsule 6  . losartan (COZAAR) 100 MG tablet Take 1 tablet (100 mg  total) by mouth daily. 30 tablet 6  . metFORMIN (GLUCOPHAGE) 500 MG tablet Take 1 tablet (500 mg total) by mouth 2 (two) times daily with a meal. 60 tablet 6  . sertraline (ZOLOFT) 100 MG tablet Take 1.5 tablets (150 mg total) by mouth daily. 135 tablet 0  . tiotropium (SPIRIVA) 18 MCG inhalation capsule Place 1 capsule (18 mcg total) into inhaler and inhale daily. 30 capsule 6  . traZODone (DESYREL) 100 MG tablet Take 1 tablet (100 mg total) by mouth at bedtime as needed for sleep. 90 tablet 0  . TRUEPLUS LANCETS 28G MISC Use as directed 100 each 2   No current facility-administered medications on file prior to visit.     LABS/IMAGING: No results found for this or any previous visit (from the past 48 hour(s)). No results found.  LIPID PANEL:    Component Value Date/Time   CHOL 200 (H) 03/04/2019 1107   TRIG 255 (H) 03/04/2019 1107   HDL 46 03/04/2019 1107   CHOLHDL 4.3 03/04/2019 1107   CHOLHDL 5.6 (H) 02/28/2016 1244   VLDL 65 (H) 02/28/2016 1244   LDLCALC 110 (H) 03/04/2019 1107     WEIGHTS: Wt Readings from Last 3 Encounters:  03/14/19 (!) 333 lb (151 kg)  02/14/19 (!) 330 lb 12.8 oz (150 kg)  12/08/18 281 lb 9.6 oz (127.7 kg)    VITALS: BP 126/87   Pulse 88   Temp (!) 97 F (36.1 C)   Ht _0  (1.753 m)   Wt (!) 333 lb (151 kg)   SpO2 94%   BMI 49.18 kg/m   EXAM: Deferred  EKG: Sinus rhythm with first-degree AV block at 86-personally reviewed  ASSESSMENT: 1. Dyspnea with exertion and at rest 2. Recurrent chest pain-negative exercise treadmill stress test and normal LVEF on echo (12/2016) 3. Hypertension - not at goal 4. History of possible sleep apnea 5. Morbid obesity  PLAN: 1.   [name] has had both dyspnea and exertion and at rest and his echo showed only mild diastolic dysfunction.  His blood pressure is now well controlled.  I suspect his dyspnea is noncardiac, possibly related to his morbid obesity.  He is scheduled to have PFTs in the near future  which may likely show restriction.  As previously recommended, I will combine his ARB/hydrochlorothiazide into 1 pill to reduce his pill burden.  No other changes to his medicines today.  Plan follow-up with APP annually or sooner as necessary.  Pixie Casino, MD, [facility], [place] Director of the [facility]  [facility] Diplomate of the American Board of Clinical Lipidology Attending Cardiologist  Direct Dial: [phone]  Fax: [phone]  Website:  [email or web address] 03/14/2019, 2:04 PM

## 2019-03-14 NOTE — Patient Instructions (Signed)
Medication Instructions:  Your losartan & hydrochlorothiazide have been combined to 1 pill  *If you need a refill on your cardiac medications before your next appointment, please call your pharmacy*  Lab Work: NONE If you have labs (blood work) drawn today and your tests are completely normal, you will receive your results only by: Marland Kitchen MyChart Message (if you have MyChart) OR . A paper copy in the mail If you have any lab test that is abnormal or we need to change your treatment, we will call you to review the results.  Testing/Procedures: NONE  Follow-Up: At [facility], you and your health needs are our priority.  As part of our continuing mission to provide you with exceptional heart care, we have created designated Provider Care Teams.  These Care Teams include your primary Cardiologist (physician) and Advanced Practice Providers (APPs -  Physician Assistants and Nurse Practitioners) who all work together to provide you with the care you need, when you need it.  Your next appointment:   12 month(s)  The format for your next appointment:   Either In Person or Virtual  Provider:   You may see Pixie Casino, MD or one of the following Advanced Practice Providers on your designated Care Team:    Almyra Deforest, PA-C  Fabian Sharp, PA-C or   Roby Lofts, [place]   Other Instructions

## 2019-03-16 MED FILL — ?METFORMIN HCL 500MG TABLET: 500 | 30 days supply | Qty: 60 | Fill #2

## 2019-03-16 MED FILL — ?AMLODIPINE BESYLATE 10 MG: 10 | 30 days supply | Qty: 30 | Fill #0

## 2019-03-16 MED FILL — LOSARTAN-HCTZ 100-12.5 MG T: 100-12.5 | 30 days supply | Qty: 30 | Fill #0

## 2019-03-21 ENCOUNTER — Ambulatory Visit: Payer: No Typology Code available for payment source | Admitting: Critical Care Medicine

## 2019-03-21 ENCOUNTER — Inpatient Hospital Stay (HOSPITAL_COMMUNITY): Admission: RE | Admit: 2019-03-21 | Payer: No Typology Code available for payment source | Source: Ambulatory Visit

## 2019-03-21 NOTE — Progress Notes (Deleted)
Synopsis: Referred in November 2020 for shortness of breath by Ladell Pier, MD.  Subjective:   PATIENT ID: Juan Kline GENDER: male DOB: 11-Jul-1970, MRN: [id number]  No chief complaint on file.   HPI   UTD flu vaccine & pneumonia vaccines   Stopped vaping? CT chest not done   Started Spiriva  PFT today   Referred for sleep medicine     OV 02/14/2019: Juan Kline is a 48 year old gentleman who presents for evaluation of shortness of breath.  This has been going on for the past 2 to 3 months and has remained stable throughout.  He noticed the shortness of breath occasionally when sitting still, but predominantly with exertion. His activity is limited due to dyspnea. He thinks that he can walk about a block slowly, and is able to walk around his house.  Some days he notices his symptoms more than others, but does not notice changes with weather.  He sometimes has dizziness with his dyspnea, but denies chest pain.  He denies cough, sputum production, wheezing.  He has occasional GERD, which he relates to taking amlodipine.  He currently vapes, using about 3 pods per day, which are compounded by someone at a local store, not a name brand.  He feels that stress could be a barrier to quitting.  He quit smoking cigarettes about 5 years ago after 25 years of 1-1.5 packs/day.  He is very inactive at baseline.  His cardiologist was concerned that he has a significant amount of deconditioning and obesity precipitating his dyspnea.  He has had panic attacks in the past, but thinks that this feels different.  He has gained about 30 pounds in the last few months, but has slowly been gaining weight for several years.  He had been this heavy several years ago, but was able to lose 40 to 60 pounds after a diabetic foot infection.  He is interested at quitting vaping-would like to titrate off rather than trying nicotine replacement therapy.  He has never had PFTs or been prescribed inhalers. He does  not have allergies or history of asthma.  He had a grandmother with COPD.  He has had his seasonal flu shot but needs his pneumonia shot.     Past Medical History:  Diagnosis Date   Chronic foot ulcer ([place]) admitted 03/02/2014   medial border first metatarsal left foot   Diabetes ([place])    Diabetic foot infection ([place])    Hyperlipidemia    Hypertension Dx 2009   OSA (obstructive sleep apnea)    CPAP   Osteomyelitis left first metatarsal 03/02/2014   Sleep apnea    Venous stasis ulcer of left lower extremity ([place])    [name] 03/02/2014     Family History  Problem Relation Age of Onset   Hypertension Mother    Hyperlipidemia Mother    Diabetes Mother    Kidney disease Mother    Hypertension Father    Hyperlipidemia Father    Diabetes Brother    Hypertension Brother    Hyperlipidemia Brother    Heart disease Maternal Grandmother    COPD Maternal Grandmother    Diabetes Maternal Grandfather    Heart attack Paternal Grandmother    Heart disease Paternal Grandfather      Past Surgical History:  Procedure Laterality Date   NO PAST SURGERIES    confirmed- no previous surgical history  Social History   Socioeconomic History   Marital status: Single    Spouse name: Not  on file   Number of children: 0   Years of education: Not on file   Highest education level: Not on file  Occupational History   Not on file  Social Needs   Financial resource strain: Not on file   Food insecurity    Worry: Not on file    Inability: Not on file   Transportation needs    Medical: Not on file    Non-medical: Not on file  Tobacco Use   Smoking status: Current Some Day Smoker    Packs/day: 1.50    Years: 26.00    Pack years: 39.00    Types: E-cigarettes, Cigarettes    Start date: 1989    Last attempt to quit: 02/16/2014    Years since quitting: 5.0   Smokeless tobacco: Never Used  Substance and Sexual Activity   Alcohol use: Yes    Comment:  03/02/2014 "I rarely drink"   Drug use: No   Sexual activity: Not Currently  Lifestyle   Physical activity    Days per week: Not on file    Minutes per session: Not on file   Stress: Not on file  Relationships   Social connections    Talks on phone: Not on file    Gets together: Not on file    Attends religious service: Not on file    Active member of club or organization: Not on file    Attends meetings of clubs or organizations: Not on file    Relationship status: Not on file   Intimate partner violence    Fear of current or ex partner: Not on file    Emotionally abused: Not on file    Physically abused: Not on file    Forced sexual activity: Not on file  Other Topics Concern   Not on file  Social History Narrative   Pt lives in 1 story home with his mother and step father   9th grade education   Last occupation was with [employer] in 2009      Unemployed at this time- thinking of filing for disability      Right handed        Allergies  Allergen Reactions   Lyrica [Pregabalin] Anxiety     Immunization History  Administered Date(s) Administered   Influenza Whole 01/19/2019   Influenza,inj,Quad PF,6+ Mos 03/03/2014, 01/17/2015, 02/28/2016, 02/10/2017, 12/31/2017   Pneumococcal Conjugate-13 02/14/2019   Pneumococcal Polysaccharide-23 03/03/2014   Tdap 06/28/2014    Outpatient Medications Prior to Visit  Medication Sig Dispense Refill   albuterol (VENTOLIN HFA) 108 (90 Base) MCG/ACT inhaler Inhale 2 puffs into the lungs every 6 (six) hours as needed for wheezing or shortness of breath. 6.7 g 11   amLODipine (NORVASC) 10 MG tablet Take 1 tablet (10 mg total) by mouth daily. 30 tablet 6   atorvastatin (LIPITOR) 40 MG tablet Take 1 tablet (40 mg total) by mouth daily. 90 tablet 3   Blood Glucose Monitoring Suppl (TRUE METRIX METER) w/Device KIT Use as directed 1 kit 0   glucose blood (TRUE METRIX BLOOD GLUCOSE TEST) test strip Use as instructed 100  each 12   losartan-hydrochlorothiazide (HYZAAR) 100-12.5 MG tablet Take 1 tablet by mouth daily. 90 tablet 3   metFORMIN (GLUCOPHAGE) 500 MG tablet Take 1 tablet (500 mg total) by mouth 2 (two) times daily with a meal. 60 tablet 6   sertraline (ZOLOFT) 100 MG tablet Take 1.5 tablets (150 mg total) by mouth daily. 135 tablet 0  tiotropium (SPIRIVA) 18 MCG inhalation capsule Place 1 capsule (18 mcg total) into inhaler and inhale daily. 30 capsule 6   traZODone (DESYREL) 100 MG tablet Take 1 tablet (100 mg total) by mouth at bedtime as needed for sleep. 90 tablet 0   TRUEPLUS LANCETS 28G MISC Use as directed 100 each 2   No facility-administered medications prior to visit.     Review of Systems  Constitutional: Positive for malaise/fatigue. Negative for chills, diaphoresis, fever and weight loss.  HENT: Negative for congestion, ear pain and sore throat.   Eyes: Negative.   Respiratory: Positive for shortness of breath. Negative for cough, hemoptysis, sputum production and wheezing.   Cardiovascular: Positive for chest pain. Negative for palpitations and leg swelling.  Gastrointestinal: Positive for abdominal pain, heartburn and nausea. Negative for blood in stool and constipation.  Genitourinary: Positive for frequency. Negative for hematuria.  Musculoskeletal: Positive for joint pain and myalgias.  Skin: Negative for itching and rash.  Neurological: Positive for dizziness, weakness and headaches. Negative for focal weakness.  Endo/Heme/Allergies: Does not bruise/bleed easily.  Psychiatric/Behavioral: Positive for depression. The patient is nervous/anxious.      Objective:   There were no vitals filed for this visit.   on  RA BMI Readings from Last 3 Encounters:  03/14/19 49.18 kg/m  02/14/19 48.85 kg/m  12/08/18 41.59 kg/m   Wt Readings from Last 3 Encounters:  03/14/19 (!) 333 lb (151 kg)  02/14/19 (!) 330 lb 12.8 oz (150 kg)  12/08/18 281 lb 9.6 oz (127.7 kg)     Physical Exam Vitals signs reviewed.  Constitutional:      General: He is not in acute distress.    Appearance: He is obese. He is not diaphoretic.  HENT:     Head: Normocephalic and atraumatic.     Nose:     Comments: Deferred due to masking requirement.    Mouth/Throat:     Comments: Deferred due to masking requirement. Eyes:     General: No scleral icterus. Neck:     Musculoskeletal: Neck supple.  Cardiovascular:     Rate and Rhythm: Normal rate and regular rhythm.     Heart sounds: No murmur.  Pulmonary:     Comments: Breathing comfortably on room air, occasionally truncated speech due to dyspnea.  Clear to auscultation bilaterally, but decreased in bilateral bases Abdominal:     General: There is no distension.     Palpations: Abdomen is soft.     Tenderness: There is no abdominal tenderness.     Comments: Left lower quadrant hernia-nontender  Musculoskeletal:        General: No swelling or deformity.  Lymphadenopathy:     Cervical: No cervical adenopathy.  Skin:    General: Skin is warm and dry.     Comments: Scaling rash behind his ears  Neurological:     Mental Status: He is alert.     Motor: No weakness.     Coordination: Coordination normal.  Psychiatric:        Mood and Affect: Mood normal.        Behavior: Behavior normal.      CBC    Component Value Date/Time   WBC 8.5 10/08/2018 1407   WBC 11.1 (H) 11/16/2017 0042   RBC 4.99 10/08/2018 1407   RBC 4.83 11/16/2017 0042   HGB 14.0 10/08/2018 1407   HCT 44.0 10/08/2018 1407   PLT 320 10/08/2018 1407   MCV 88 10/08/2018 1407   MCH 28.1  10/08/2018 1407   MCH 31.1 11/16/2017 0042   MCHC 31.8 10/08/2018 1407   MCHC 34.4 11/16/2017 0042   RDW 14.3 10/08/2018 1407   LYMPHSABS 1.6 11/16/2017 0042   MONOABS 0.7 11/16/2017 0042   EOSABS 0.1 11/16/2017 0042   BASOSABS 0.0 11/16/2017 0042    CMP Latest Ref Rng & Units 10/08/2018 11/16/2017 10/06/2017  Glucose 65 - 99 mg/dL 109(H) 133(H) 148(H)  BUN  6 - 24 mg/dL 22 25(H) 29(H)  Creatinine 0.76 - 1.27 mg/dL 0.77 0.90 1.04  Sodium 134 - 144 mmol/L 137 138 138  Potassium 3.5 - 5.2 mmol/L 4.4 3.5 3.8  Chloride 96 - 106 mmol/L 100 101 100  CO2 20 - 29 mmol/L 21 - 27  Calcium 8.7 - 10.2 mg/dL 9.0 - 9.5  Total Protein 6.0 - 8.5 g/dL 6.9 - -  Total Bilirubin 0.0 - 1.2 mg/dL <0.2 - -  Alkaline Phos 39 - 117 IU/L 46 - -  AST 0 - 40 IU/L 18 - -  ALT 0 - 44 IU/L 16 - -     Chest Imaging- films reviewed: CXR, 2 view 11/15/2017-right hemidiaphragm eventration, prominent right heart, normal lungs.  Pulmonary Functions Testing Results: No flowsheet data found.    PSG 10/27/2017-OSA, titrated to BiPAP 19/15 cm H2O when wearing full facemask  Echocardiogram 12/2016: - Left ventricle: The cavity size was normal. Wall thickness wasincreased in a pattern of mild LVH. Systolic function was normal. The estimated ejection fraction was in the range of 55% to 60%.Wall motion was normal; there were no regional wall motion abnormalities. Doppler parameters are consistent with abnormalleft ventricular relaxation (grade 1 diastolic dysfunction). - Mitral valve: Calcified annulus. - Left atrium: The atrium was mildly dilated. - Atrial septum: No defect or patent foramen ovale was identified.  Echocardiogram 02/04/2019: LVEF 60 to 65%, mild LVH, diastolic dysfunction.  Normal LA, RV, RA.  Normal valves.  Exercise tolerance test 12/2016:  The patient walked for 6:05 of a Bruce protocol. Peak HR of 151 which is 86% predicted maximal HR .  There were no ST or T wave changes to suggest ischemia  BP response was normal  Negative GXT       Assessment & Plan:   No diagnosis found.   Shortness of breath, prominent dyspnea on exertion- Likely multifactorial.  I worry with his smoking and vaping history that he could have COPD, and hopefully not vaping induced lung injury.  He is at risk for OHS. -Recommend stopping vaping-- he plans to work on  quitting vaping gradually over the next week and will use up his current supply of pods without purchasing more. -Recommend weight loss as a long-term goal -Recommend increased physical activity to regain endurance.  He may benefit from pulmonary rehab. -Due to suspicion for COPD, starting Spiriva once daily.  Inhaler training provided. -Albuterol every 4 hours as needed -Up-to-date on seasonal flu vaccine.  Prevnar 13 today.  Up-to-date on Pneumovax Pneumovax 23. -PFTs -Depending on his response to treatment and PFT results, may require advanced imaging to evaluate for vaping induced lung injury.  He has a CTA chest previously ordered by another provider.  HFpEF -Recommend optimizing sleep apnea, hypertension, weight loss  Morbid obesity, placing him at risk for OHS and restrictive lung disease. -Recommend weight loss -Discussed dietary changes and lifestyle changes that are required for long-term successful weight loss.  OSA -Referral to sleep medicine -Continue with current CPAP   RTC in 4 to 6 weeks after PFTs.  Current Outpatient Medications:    albuterol (VENTOLIN HFA) 108 (90 Base) MCG/ACT inhaler, Inhale 2 puffs into the lungs every 6 (six) hours as needed for wheezing or shortness of breath., Disp: 6.7 g, Rfl: 11   amLODipine (NORVASC) 10 MG tablet, Take 1 tablet (10 mg total) by mouth daily., Disp: 30 tablet, Rfl: 6   atorvastatin (LIPITOR) 40 MG tablet, Take 1 tablet (40 mg total) by mouth daily., Disp: 90 tablet, Rfl: 3   Blood Glucose Monitoring Suppl (TRUE METRIX METER) w/Device KIT, Use as directed, Disp: 1 kit, Rfl: 0   glucose blood (TRUE METRIX BLOOD GLUCOSE TEST) test strip, Use as instructed, Disp: 100 each, Rfl: 12   losartan-hydrochlorothiazide (HYZAAR) 100-12.5 MG tablet, Take 1 tablet by mouth daily., Disp: 90 tablet, Rfl: 3   metFORMIN (GLUCOPHAGE) 500 MG tablet, Take 1 tablet (500 mg total) by mouth 2 (two) times daily with a meal., Disp: 60 tablet,  Rfl: 6   sertraline (ZOLOFT) 100 MG tablet, Take 1.5 tablets (150 mg total) by mouth daily., Disp: 135 tablet, Rfl: 0   tiotropium (SPIRIVA) 18 MCG inhalation capsule, Place 1 capsule (18 mcg total) into inhaler and inhale daily., Disp: 30 capsule, Rfl: 6   traZODone (DESYREL) 100 MG tablet, Take 1 tablet (100 mg total) by mouth at bedtime as needed for sleep., Disp: 90 tablet, Rfl: 0   TRUEPLUS LANCETS 28G MISC, Use as directed, Disp: 100 each, Rfl: 2   Julian Hy, DO [facility] 03/21/2019 8:43 AM

## 2019-04-01 ENCOUNTER — Other Ambulatory Visit (HOSPITAL_COMMUNITY)
Admission: RE | Admit: 2019-04-01 | Discharge: 2019-04-01 | Disposition: A | Discharge status: Home / Self Care | Payer: No Typology Code available for payment source | Source: Ambulatory Visit | Attending: Pulmonary Disease | Admitting: Pulmonary Disease

## 2019-04-01 ENCOUNTER — Telehealth: Payer: Self-pay | Admitting: Pulmonary Disease

## 2019-04-01 DIAGNOSIS — Z01812 Encounter for preprocedural laboratory examination: Secondary | ICD-10-CM | POA: Insufficient documentation

## 2019-04-01 DIAGNOSIS — Z20828 Contact with and (suspected) exposure to other viral communicable diseases: Secondary | ICD-10-CM | POA: Insufficient documentation

## 2019-04-01 LAB — SARS CORONAVIRUS 2 (TAT 6-24 HRS): SARS Coronavirus 2: NEGATIVE

## 2019-04-01 MED FILL — ATORVASTATIN 40 MG TABLET: 40 | 30 days supply | Qty: 30 | Fill #1

## 2019-04-01 MED FILL — SERTRALINE HCL 100 MG TAB: 100 | 30 days supply | Qty: 45 | Fill #1

## 2019-04-01 NOTE — Telephone Encounter (Signed)
Covid test scheduled today pt aware

## 2019-04-04 ENCOUNTER — Ambulatory Visit (HOSPITAL_COMMUNITY)
Admission: RE | Admit: 2019-04-04 | Discharge: 2019-04-04 | Disposition: A | Discharge status: Home / Self Care | Payer: No Typology Code available for payment source | Source: Ambulatory Visit | Attending: Critical Care Medicine | Admitting: Critical Care Medicine

## 2019-04-04 ENCOUNTER — Encounter: Payer: Self-pay | Admitting: Pulmonary Disease

## 2019-04-04 ENCOUNTER — Other Ambulatory Visit: Payer: Self-pay

## 2019-04-04 ENCOUNTER — Ambulatory Visit (INDEPENDENT_AMBULATORY_CARE_PROVIDER_SITE_OTHER): Payer: Self-pay | Admitting: Pulmonary Disease

## 2019-04-04 VITALS — BP 128/84 | HR 95 | Temp 96.2°F | Ht 69.0 in | Wt 330.0 lb

## 2019-04-04 DIAGNOSIS — J449 Chronic obstructive pulmonary disease, unspecified: Secondary | ICD-10-CM | POA: Insufficient documentation

## 2019-04-04 DIAGNOSIS — Z87891 Personal history of nicotine dependence: Secondary | ICD-10-CM

## 2019-04-04 DIAGNOSIS — G4733 Obstructive sleep apnea (adult) (pediatric): Secondary | ICD-10-CM

## 2019-04-04 DIAGNOSIS — Z720 Tobacco use: Secondary | ICD-10-CM

## 2019-04-04 DIAGNOSIS — I872 Venous insufficiency (chronic) (peripheral): Secondary | ICD-10-CM

## 2019-04-04 DIAGNOSIS — U070 Vaping-related disorder: Secondary | ICD-10-CM | POA: Insufficient documentation

## 2019-04-04 DIAGNOSIS — R0602 Shortness of breath: Secondary | ICD-10-CM

## 2019-04-04 LAB — PULMONARY FUNCTION TEST
DL/VA % pred: 109 %
DL/VA: 4.92 ml/min/mmHg/L
DLCO unc % pred: 76 %
DLCO unc: 22.07 ml/min/mmHg
FEF 25-75 Post: 2.18 L/sec
FEF 25-75 Pre: 1.76 L/sec
FEF2575-%Change-Post: 23 %
FEF2575-%Pred-Post: 61 %
FEF2575-%Pred-Pre: 49 %
FEV1-%Change-Post: 6 %
FEV1-%Pred-Post: 61 %
FEV1-%Pred-Pre: 57 %
FEV1-Post: 2.38 L
FEV1-Pre: 2.24 L
FEV1FVC-%Change-Post: 1 %
FEV1FVC-%Pred-Pre: 93 %
FEV6-%Change-Post: 4 %
FEV6-%Pred-Post: 66 %
FEV6-%Pred-Pre: 63 %
FEV6-Post: 3.19 L
FEV6-Pre: 3.04 L
FEV6FVC-%Pred-Post: 103 %
FEV6FVC-%Pred-Pre: 103 %
FVC-%Change-Post: 4 %
FVC-%Pred-Post: 64 %
FVC-%Pred-Pre: 61 %
FVC-Post: 3.19 L
FVC-Pre: 3.05 L
Post FEV1/FVC ratio: 75 %
Post FEV6/FVC ratio: 100 %
Pre FEV1/FVC ratio: 73 %
Pre FEV6/FVC Ratio: 100 %
RV % pred: 123 %
RV: 2.37 L
TLC % pred: 79 %
TLC: 5.38 L

## 2019-04-04 MED ORDER — ALBUTEROL SULFATE (2.5 MG/3ML) 0.083% IN NEBU
2.5000 mg | INHALATION_SOLUTION | Freq: Once | RESPIRATORY_TRACT | Status: AC
  Administered 2019-04-04: 2.5 mg via RESPIRATORY_TRACT

## 2019-04-04 NOTE — Assessment & Plan Note (Signed)
Plan: Emphasized need for the patient to stop using e-cigarettes

## 2019-04-04 NOTE — Assessment & Plan Note (Signed)
Plan: Continue CPAP Establish care with sleep MD in our practice

## 2019-04-04 NOTE — Assessment & Plan Note (Signed)
Plan: Continue Spiriva [facility] We will discuss pulmonary function testing with Dr. Carlis Abbott May need to consider CT chest axial imaging sometime in calendar year 2021 Emphasized the need for the patient to stop using e-cigarettes

## 2019-04-04 NOTE — Progress Notes (Signed)
_0  ID: Juan Kline, male    DOB: 1970-05-27, 48 y.o.   MRN: [id number]  Chief Complaint  Patient presents with  . Follow-up    1 mo f/u for SOB. Had a PFT this morning at [facility]. States his SOB has not changed.     Referring provider: Ladell Pier, MD  HPI:  48 year old male current everyday smoker found our office for shortness of breath.  Initial consult was on 02/14/2019  PMH: Hyperlipidemia, hypertension, morbid obesity, vitamin D deficiency, anxiety, type 2 diabetes, depression Smoker/ Smoking History: Current smoker /electronic cigarette user. 39 pack year.  Maintenance: Spiriva HandiHaler Pt of: Dr. Carlis Abbott  04/04/2019  - Visit   48 year old male current every day electronic cigarette user presenting to office today to review pulmonary function testing.  Pulmonary function testing results completed below:  04/04/2019-pulmonary function test-FVC 3.05 (61% predicted), postbronchodilator ratio 75, postbronchodilator FEV1 2.38 (61% predicted), no bronchodilator response, DLCO 22.07 (76% predicted) Moderately severe obstructive airways disease, moderate restriction  Patient presents today and he feels that his breathing is at his baseline.  He does feel the Spiriva HandiHaler has helped some.  Patient continues to vape.  He reports that he has not purchased any additional vaping supplies he continues to work down his stock.  He reports that he may have maybe 1 or 2 weeks left.  He plans on stopping smoking after that.  Difficult to fully ascertain how much nicotine is actually in the vaping supplies as he reports that it is compounded at the store that he purchases that.  Patient reports that he has vape today.  He reports he is probably vape for about 3 years.  Prior to that patient smoked for about 25 years.  He quit smoking in 2015.  Questionaires / Pulmonary Flowsheets:   MMRC: mMRC Dyspnea Scale mMRC Score  04/04/2019 2    Tests:   04/04/2019-pulmonary function  test-FVC 3.05 (61% predicted), postbronchodilator ratio 75, postbronchodilator FEV1 2.38 (61% predicted), no bronchodilator response, DLCO 22.07 (76% predicted) Moderately severe obstructive airways disease, moderate restriction  Chest Imaging: CXR, 2 view 11/15/2017-right hemidiaphragm eventration, prominent right heart, normal lungs.  PSG 10/27/2017-OSA, titrated to BiPAP 19/15 cm H2O when wearing full facemask  Echocardiogram 12/2016: - Left ventricle: The cavity size was normal. Wall thickness wasincreased in a pattern of mild LVH. Systolic function was normal. The estimated ejection fraction was in the range of 55% to 60%.Wall motion was normal; there were no regional wall motion abnormalities. Doppler parameters are consistent with abnormalleft ventricular relaxation (grade 1 diastolic dysfunction). - Mitral valve: Calcified annulus. - Left atrium: The atrium was mildly dilated. - Atrial septum: No defect or patent foramen ovale was identified.  Echocardiogram 02/04/2019: LVEF 60 to 65%, mild LVH, diastolic dysfunction.  Normal LA, RV, RA.  Normal valves.  Exercise tolerance test 12/2016:  The patient walked for 6:05 of a Bruce protocol. Peak HR of 151 which is 86% predicted maximal HR .  There were no ST or T wave changes to suggest ischemia  BP response was normal  Negative GXT  FENO:  No results found for: NITRICOXIDE  PFT: PFT Results Latest Ref Rng & Units 04/04/2019  FVC-Pre L 3.05  FVC-Predicted Pre % 61  FVC-Post L 3.19  FVC-Predicted Post % 64  Pre FEV1/FVC % % 73  Post FEV1/FCV % % 75  FEV1-Pre L 2.24  FEV1-Predicted Pre % 57  FEV1-Post L 2.38  DLCO UNC% % 76  DLCO COR %Predicted %  109  TLC L 5.38  TLC % Predicted % 79  RV % Predicted % 123    WALK:  No flowsheet data found.  Imaging: No results found.  Lab Results:  CBC    Component Value Date/Time   WBC 8.5 10/08/2018 1407   WBC 11.1 (H) 11/16/2017 0042   RBC 4.99 10/08/2018 1407    RBC 4.83 11/16/2017 0042   HGB 14.0 10/08/2018 1407   HCT 44.0 10/08/2018 1407   PLT 320 10/08/2018 1407   MCV 88 10/08/2018 1407   MCH 28.1 10/08/2018 1407   MCH 31.1 11/16/2017 0042   MCHC 31.8 10/08/2018 1407   MCHC 34.4 11/16/2017 0042   RDW 14.3 10/08/2018 1407   LYMPHSABS 1.6 11/16/2017 0042   MONOABS 0.7 11/16/2017 0042   EOSABS 0.1 11/16/2017 0042   BASOSABS 0.0 11/16/2017 0042    BMET    Component Value Date/Time   NA 137 10/08/2018 1407   K 4.4 10/08/2018 1407   CL 100 10/08/2018 1407   CO2 21 10/08/2018 1407   GLUCOSE 109 (H) 10/08/2018 1407   GLUCOSE 133 (H) 11/16/2017 0207   BUN 22 10/08/2018 1407   CREATININE 0.77 10/08/2018 1407   CREATININE 0.70 02/28/2016 1244   CALCIUM 9.0 10/08/2018 1407   GFRNONAA 108 10/08/2018 1407   GFRNONAA >89 02/28/2016 1244   GFRAA 125 10/08/2018 1407   GFRAA >89 02/28/2016 1244    BNP    Component Value Date/Time   BNP 18.3 10/08/2018 1407   BNP 38.3 08/05/2017 1543    ProBNP No results found for: PROBNP  Specialty Problems      Pulmonary Problems   OSA (obstructive sleep apnea)   COPD mixed type ([place])     04/04/2019-pulmonary function test-FVC 3.05 (61% predicted), postbronchodilator ratio 75, postbronchodilator FEV1 2.38 (61% predicted), no bronchodilator response, DLCO 22.07 (76% predicted) Moderately severe obstructive airways disease, moderate restriction          Allergies  Allergen Reactions  . Lyrica [Pregabalin] Anxiety    Immunization History  Administered Date(s) Administered  . Influenza Whole 01/19/2019  . Influenza,inj,Quad PF,6+ Mos 03/03/2014, 01/17/2015, 02/28/2016, 02/10/2017, 12/31/2017  . Pneumococcal Conjugate-13 02/14/2019  . Pneumococcal Polysaccharide-23 03/03/2014  . Tdap 06/28/2014    Past Medical History:  Diagnosis Date  . Chronic foot ulcer ([place]) admitted 03/02/2014   medial border first metatarsal left foot  . Diabetes ([place])   . Diabetic foot infection ([place])   .  Hyperlipidemia   . Hypertension Dx 2009  . OSA (obstructive sleep apnea)    CPAP  . Osteomyelitis left first metatarsal 03/02/2014  . Sleep apnea   . Venous stasis ulcer of left lower extremity ([place])    [name] 03/02/2014    Tobacco History: Social History   Tobacco Use  Smoking Status Current Some Day Smoker  . Packs/day: 1.50  . Years: 26.00  . Pack years: 39.00  . Types: E-cigarettes, Cigarettes  . Start date: 30  . Last attempt to quit: 02/16/2014  . Years since quitting: 5.1  Smokeless Tobacco Never Used   Ready to quit: No Counseling given: Yes   Smoking assessment and cessation counseling  Patient currently smoking: Vaping I have advised the patient to quit/stop smoking as soon as possible due to high risk for multiple medical problems.  It will also be very difficult for [name] to manage patient's  respiratory symptoms and status if we continue to expose her lungs to a known irritant.  We do not advise  e-cigarettes as a form of stopping smoking.  Patient is not willing to quit smoking.  Patient reports he will not purchase any more vaping supplies after his stock is finished.  He has not yet set a quit date.  I have advised the patient that we can assist and have options of nicotine replacement therapy, provided smoking cessation education today, provided smoking cessation counseling, and provided cessation resources.  Follow-up next office visit office visit for assessment of smoking cessation.    Smoking cessation counseling advised for: 5 min    Outpatient Encounter Medications as of 04/04/2019  Medication Sig  . albuterol (VENTOLIN HFA) 108 (90 Base) MCG/ACT inhaler Inhale 2 puffs into the lungs every 6 (six) hours as needed for wheezing or shortness of breath.  Marland Kitchen amLODipine (NORVASC) 10 MG tablet Take 1 tablet (10 mg total) by mouth daily.  Marland Kitchen atorvastatin (LIPITOR) 40 MG tablet Take 1 tablet (40 mg total) by mouth daily.  . Blood Glucose Monitoring Suppl  (TRUE METRIX METER) w/Device KIT Use as directed  . glucose blood (TRUE METRIX BLOOD GLUCOSE TEST) test strip Use as instructed  . losartan-hydrochlorothiazide (HYZAAR) 100-12.5 MG tablet Take 1 tablet by mouth daily.  . metFORMIN (GLUCOPHAGE) 500 MG tablet Take 1 tablet (500 mg total) by mouth 2 (two) times daily with a meal.  . sertraline (ZOLOFT) 100 MG tablet Take 1.5 tablets (150 mg total) by mouth daily.  Marland Kitchen tiotropium (SPIRIVA) 18 MCG inhalation capsule Place 1 capsule (18 mcg total) into inhaler and inhale daily.  . traZODone (DESYREL) 100 MG tablet Take 1 tablet (100 mg total) by mouth at bedtime as needed for sleep.  . TRUEPLUS LANCETS 28G MISC Use as directed   No facility-administered encounter medications on file as of 04/04/2019.     Review of Systems  Review of Systems  Constitutional: Negative for activity change, chills, fatigue, fever and unexpected weight change.  HENT: Negative for postnasal drip, rhinorrhea, sinus pressure, sinus pain and sore throat.   Eyes: Negative.   Respiratory: Negative for cough, shortness of breath and wheezing.   Cardiovascular: Negative for chest pain and palpitations.  Endocrine: Negative.   Genitourinary: Negative.   Musculoskeletal: Positive for arthralgias.  Skin: Negative.   Neurological: Negative for dizziness and headaches.  Psychiatric/Behavioral: Positive for sleep disturbance. Negative for dysphoric mood. The patient is not nervous/anxious.   All other systems reviewed and are negative.    Physical Exam  BP 128/84 (BP Location: Left Arm, Patient Position: Sitting, Cuff Size: Large)   Pulse 95   Temp (!) 96.2 F (35.7 C) (Temporal)   Ht _0  (1.753 m)   Wt (!) 330 lb (149.7 kg)   SpO2 97% Comment: on RA  BMI 48.73 kg/m   Wt Readings from Last 5 Encounters:  04/04/19 (!) 330 lb (149.7 kg)  03/14/19 (!) 333 lb (151 kg)  02/14/19 (!) 330 lb 12.8 oz (150 kg)  12/08/18 281 lb 9.6 oz (127.7 kg)  10/18/18 300 lb (136.1  kg)    BMI Readings from Last 5 Encounters:  04/04/19 48.73 kg/m  03/14/19 49.18 kg/m  02/14/19 48.85 kg/m  12/08/18 41.59 kg/m  10/18/18 44.30 kg/m     Physical Exam Vitals and nursing note reviewed.  Constitutional:      General: He is not in acute distress.    Appearance: Normal appearance. He is obese.  HENT:     Head: Normocephalic and atraumatic.     Right Ear: Hearing, tympanic membrane, ear  canal and external ear normal.     Left Ear: Hearing, tympanic membrane, ear canal and external ear normal.     Nose: Nose normal. No mucosal edema or rhinorrhea.     Right Turbinates: Not enlarged.     Left Turbinates: Not enlarged.     Mouth/Throat:     Mouth: Mucous membranes are dry.     Pharynx: Oropharynx is clear. No oropharyngeal exudate.  Eyes:     Pupils: Pupils are equal, round, and reactive to light.  Cardiovascular:     Rate and Rhythm: Normal rate and regular rhythm.     Pulses: Normal pulses.     Heart sounds: No murmur.  Pulmonary:     Effort: Pulmonary effort is normal.     Breath sounds: Examination of the right-lower field reveals decreased breath sounds. Examination of the left-lower field reveals decreased breath sounds. Decreased breath sounds present. No wheezing or rales.  Abdominal:     General: Bowel sounds are normal. There is no distension.     Palpations: Abdomen is soft.     Tenderness: There is no abdominal tenderness.     Comments: Truncal obesity  Musculoskeletal:     Cervical back: Normal range of motion.     Right lower leg: Edema present.     Left lower leg: Edema present.  Lymphadenopathy:     Cervical: No cervical adenopathy.  Skin:    General: Skin is warm and dry.     Capillary Refill: Capillary refill takes less than 2 seconds.     Findings: No erythema or rash.  Neurological:     General: No focal deficit present.     Mental Status: He is alert and oriented to person, place, and time.     Motor: No weakness.      Coordination: Coordination normal.     Gait: Gait is intact. Gait normal.  Psychiatric:        Mood and Affect: Mood normal.        Behavior: Behavior normal. Behavior is cooperative.        Thought Content: Thought content normal.        Judgment: Judgment normal.       Assessment & Plan:   COPD mixed type ([place]) Plan: Continue Spiriva HandiHaler We will discuss pulmonary function testing with Dr. Carlis Abbott May need to consider CT chest axial imaging sometime in calendar year 2021 Emphasized the need for the patient to stop using e-cigarettes  OSA (obstructive sleep apnea) Plan: Continue CPAP Establish care with sleep MD in our practice  Venous stasis dermatitis of both lower extremities Patient reporting increase bilateral leg pain Patient noncompliant with gabapentin  Plan: Elevate legs Continue to work on weight loss Follow-up with primary care  Former smoker Plan: Continue to not smoke cigarettes  Morbid obesity ([place]) Plan: Continue to increase physical activity Work to reduce BMI Restriction on pulmonary function testing today likely due to morbid obesity  Vapes nicotine containing substance Plan: Emphasized need for the patient to stop using e-cigarettes    Return in about 2 months (around 06/05/2019), or if symptoms worsen or fail to improve, for Follow up with Dr. Carlis Abbott.   Lauraine Rinne, NP 04/04/2019   This appointment was 32 minutes long with over 50% of the time in direct face-to-face patient care, assessment, plan of care, and follow-up.

## 2019-04-04 NOTE — Patient Instructions (Addendum)
You were seen today by Lauraine Rinne, NP  for:   1. OSA (obstructive sleep apnea)  Continue to use your CPAP daily Please to schedule an appointment with one of our sleep MDs in a 30-minute time slot sometime over the next 4 weeks  We recommend that you continue using your CPAP daily >>>Keep up the hard work using your device >>> Goal should be wearing this for the entire night that you are sleeping, at least 4 to 6 hours  Remember:  . Do not drive or operate heavy machinery if tired or drowsy.  . Please notify the supply company and office if you are unable to use your device regularly due to missing supplies or machine being broken.  . Work on maintaining a healthy weight and following your recommended nutrition plan  . Maintain proper daily exercise and movement  . Maintaining proper use of your device can also help improve management of other chronic illnesses such as: Blood pressure, blood sugars, and weight management.   BiPAP/ CPAP Cleaning:  >>>Clean weekly, with Dawn soap, and bottle brush.  Set up to air dry. >>> Wipe mask out daily with wet wipe or towelette  2. Vapes nicotine containing substance  We recommend that you stop smoking.  >>>You need to set a quit date >>>If you have friends or family who smoke, let them know you are trying to quit and not to smoke around you or in your living environment  Smoking Cessation Resources:  1 800 QUIT NOW  >>> Patient to call this resource and utilize it to help support her quit smoking >>> Keep up your hard work with stopping smoking  You can also contact the [facility] >>>For smoking cessation classes call [phone]  We do not recommend using e-cigarettes as a form of stopping smoking  You can sign up for smoking cessation support texts and information:  >>>https://smokefree.gov/smokefreetxt   3. COPD mixed type ([place])  Continue Spiriva HandiHaler  Please stop vaping  4. Venous stasis dermatitis of  both lower extremities  Follow back up with primary care regarding your lower extremity pain  5. Morbid obesity ([place])  Work on increasing overall physical activity Work to reduce BMI Follow a low carbohydrate diet Continue to work with primary care and nutrition   Follow Up:    Return in about 2 months (around 06/05/2019), or if symptoms worsen or fail to improve, for Follow up with Dr. Carlis Abbott.  Please also schedule an additional appointment within 4 weeks with one of our sleep MDs in a 30-minute time slot to establish care with them  Please do your part to reduce the spread of COVID-19:      Reduce your risk of any infection  and COVID19 by using the similar precautions used for avoiding the common cold or flu:  Marland Kitchen Wash your hands often with soap and warm water for at least 20 seconds.  If soap and water are not readily available, use an alcohol-based hand sanitizer with at least 60% alcohol.  . If coughing or sneezing, cover your mouth and nose by coughing or sneezing into the elbow areas of your shirt or coat, into a tissue or into your sleeve (not your hands). Langley Gauss A MASK when in public  . Avoid shaking hands with others and consider head nods or verbal greetings only. . Avoid touching your eyes, nose, or mouth with unwashed hands.  . Avoid close contact with people who are sick. Marland Kitchen  Avoid places or events with large numbers of people in one location, like concerts or sporting events. . If you have some symptoms but not all symptoms, continue to monitor at home and seek medical attention if your symptoms worsen. . If you are having a medical emergency, call 911.   [place] / e-Visit: [email or web address]         [facility]: [facility]: [id number]                   [facility]: [id number]     It is flu season:     >>> Best ways to protect herself from the flu: Receive the yearly flu vaccine, practice good hand hygiene washing with soap and also using hand sanitizer when available, eat a nutritious meals, get adequate rest, hydrate appropriately   Please contact the office if your symptoms worsen or you have concerns that you are not improving.   Thank you for choosing [facility] for your healthcare, and for allowing [name] to partner with you on your healthcare journey. I am thankful to be able to provide care to you today.   Wyn Quaker FNP-C

## 2019-04-04 NOTE — Assessment & Plan Note (Signed)
Plan: Continue to increase physical activity Work to reduce BMI Restriction on pulmonary function testing today likely due to morbid obesity

## 2019-04-04 NOTE — Assessment & Plan Note (Signed)
Plan: Continue to not smoke cigarettes

## 2019-04-04 NOTE — Assessment & Plan Note (Signed)
Patient reporting increase bilateral leg pain Patient noncompliant with gabapentin  Plan: Elevate legs Continue to work on weight loss Follow-up with primary care

## 2019-04-18 MED FILL — LOSARTAN-HCTZ 100-12.5 MG T: 100-12.5 | 30 days supply | Qty: 30 | Fill #1

## 2019-04-18 MED FILL — ?METFORMIN HCL 500MG TABLET: 500 | 30 days supply | Qty: 60 | Fill #0

## 2019-04-18 MED FILL — ?AMLODIPINE BESYLATE 10 MG: 10 | 30 days supply | Qty: 30 | Fill #1

## 2019-05-02 ENCOUNTER — Other Ambulatory Visit: Payer: Self-pay

## 2019-05-02 ENCOUNTER — Ambulatory Visit (INDEPENDENT_AMBULATORY_CARE_PROVIDER_SITE_OTHER): Payer: No Typology Code available for payment source | Admitting: Psychiatry

## 2019-05-02 DIAGNOSIS — F32A Depression, unspecified: Secondary | ICD-10-CM

## 2019-05-02 DIAGNOSIS — F411 Generalized anxiety disorder: Secondary | ICD-10-CM

## 2019-05-02 DIAGNOSIS — F320 Major depressive disorder, single episode, mild: Secondary | ICD-10-CM

## 2019-05-02 MED ORDER — SERTRALINE HCL 100 MG PO TABS: 150.0000 mg | ORAL_TABLET | Freq: Every day | ORAL | 0 refills | Status: DC

## 2019-05-02 MED ORDER — TRAZODONE HCL 100 MG PO TABS: 100.0000 mg | ORAL_TABLET | Freq: Every evening | ORAL | 0 refills | Status: DC | PRN

## 2019-05-02 MED FILL — SERTRALINE HCL 100 MG TAB: 100 | 30 days supply | Qty: 45 | Fill #2

## 2019-05-02 MED FILL — traZODone HCL 100 MG TABS: 100 | 30 days supply | Qty: 30 | Fill #0

## 2019-05-02 NOTE — Progress Notes (Signed)
[place] MD/PA/NP OP Progress Note  05/02/2019 3:07 PM Juan Kline  MRN:  [id number] Interview was conducted by phone and I verified that I was speaking with the correct person using two identifiers. I discussed the limitations of evaluation and management by telemedicine and  the availability of in person appointments. Patient expressed understanding and agreed to proceed.  Chief Complaint: "I've been doing pretty well".  HPI: 49 yo single male with approximately 2 year hx of increased anxiety, some situational depression and occasional spells/episodes of shaking, tunnel vision, SOB, dizziness, lightheadedness. Patient has been seen last year by Dr. Charlotte Crumb and has had brain CT and MRI as well as EEG done - all basically normal. Patient's depressed mood was originallytriggered by ultimatum given to him by mother/step-father last year that he either finds a job or will have to move out of their home. He works now at [employer] part time but financial stress is still present. He has considered applying for disabilityas he felt physically challenging to do his job. He has never felt suicidal and has no hx of inpatient psychiatric admissions. He denies having history of mania or psychosis. He has been taking sertraline since June of 2019 -we increased the dose few months ago and he iscurrentlyat 146m. He tolerates it well and feels that his anxiety has subsided. Prior to sertraline he has tried escitalopram - does not recall if it was helpful. He reports having good appetite and adequate sleep with trazodone (he has OSA and uses CPAP). GBeckhamadmits to a hx of alcohol use disorderbutquit on his own 4 years ago. He denies abusing street drugs now or in the past. Overall. He reports feeling "pretty stable" on current medications.   Visit Diagnosis:    ICD-10-CM   1. GAD (generalized anxiety disorder)  F41.1   2. Mild depression (HCC)  F32.0 sertraline (ZOLOFT) 100 MG tablet    Past Psychiatric  History: Please see intake H&P.  Past Medical History:  Past Medical History:  Diagnosis Date  . [place] ([place] [place] 03/02/[place]   medial border first metatarsal left foot  . [place] ([place]   . Diabetic foot infection ([facility] [place]   . Hyperlipidemia   . Hypertension Dx 2009  . OSA (obstructive sleep apnea)    CPAP  . Osteomyelitis left first metatarsal 03/02/2014  . Sleep apnea   . Venous stasis ulcer of left lower extremity ([facility]    /[name]11/19/2015    Past Surgical History:  Procedure Laterality Date  . NO PAST SURGERIES      Family Psychiatric History: None.  Family History:  Family History  Problem Relation Age of Onset  . Hypertension Mother   . Hyperlipidemia Mother   . Diabetes Mother   . Kidney disease Mother   . Hypertension Father   . Hyperlipidemia Father   . Diabetes Brother   . Hypertension Brother   . Hyperlipidemia Brother   . Heart disease Maternal Grandmother   . COPD Maternal Grandmother   . Diabetes Maternal Grandfather   . Heart attack Paternal Grandmother   . Heart disease Paternal Grandfather     Social History:  Social History   Socioeconomic History  . Marital status: Single    Spouse name: Not on file  . Number of children: 0  . Years of education: Not on file  . Highest education level: Not on file  Occupational History  . Not on file  Tobacco Use  . Smoking status: Current Some Day  Smoker    Packs/day: 1.50    Years: 26.00    Pack years: 39.00    Types: E-cigarettes, Cigarettes    Start date: 1989    Last attempt to quit: 02/16/2014    Years since quitting: 5.2  . Smokeless tobacco: Never Used  Substance and Sexual Activity  . Alcohol use: Yes    Comment: 03/02/2014 "I rarely drink"  . Drug use: No  . Sexual activity: Not Currently  Other Topics Concern  . Not on file  Social History Narrative   Pt lives in 1 story home with his mother and step father   9th grade education   Last occupation was with [employer]  in 2009      Unemployed at this time- thinking of filing for disability      Right handed      Social Determinants of Health   Financial Resource Strain:   . Difficulty of Paying Living Expenses: Not on file  Food Insecurity:   . Worried About Charity fundraiser in the Last Year: Not on file  . Ran Out of Food in the Last Year: Not on file  Transportation Needs:   . Lack of Transportation (Medical): Not on file  . Lack of Transportation (Non-Medical): Not on file  Physical Activity:   . Days of Exercise per Week: Not on file  . Minutes of Exercise per Session: Not on file  Stress:   . Feeling of Stress : Not on file  Social Connections:   . Frequency of Communication with Friends and Family: Not on file  . Frequency of Social Gatherings with Friends and Family: Not on file  . Attends Religious Services: Not on file  . Active Member of Clubs or Organizations: Not on file  . Attends Archivist Meetings: Not on file  . Marital Status: Not on file    Allergies:  Allergies  Allergen Reactions  . Lyrica [Pregabalin] Anxiety    Metabolic Disorder Labs: Lab Results  Component Value Date   HGBA1C 6.6 (H) 03/04/2019   MPG 134 (H) 03/03/2014   No results found for: PROLACTIN Lab Results  Component Value Date   CHOL 200 (H) 03/04/2019   TRIG 255 (H) 03/04/2019   HDL 46 03/04/2019   CHOLHDL 4.3 03/04/2019   VLDL 65 (H) 02/28/2016   LDLCALC 110 (H) 03/04/2019   LDLCALC 163 (H) 12/10/2016   Lab Results  Component Value Date   TSH 4.170 08/13/2017   TSH 2.310 09/26/2016    Therapeutic Level Labs: No results found for: LITHIUM No results found for: VALPROATE No components found for:  CBMZ  Current Medications: Current Outpatient Medications  Medication Sig Dispense Refill  . albuterol (VENTOLIN HFA) 108 (90 Base) MCG/ACT inhaler Inhale 2 puffs into the lungs every 6 (six) hours as needed for wheezing or shortness of breath. 6.7 Juan 11  . amLODipine  (NORVASC) 10 MG tablet Take 1 tablet (10 mg total) by mouth daily. 30 tablet 6  . atorvastatin (LIPITOR) 40 MG tablet Take 1 tablet (40 mg total) by mouth daily. 90 tablet 3  . Blood Glucose Monitoring Suppl (TRUE METRIX METER) w/Device KIT Use as directed 1 kit 0  . glucose blood (TRUE METRIX BLOOD GLUCOSE TEST) test strip Use as instructed 100 each 12  . losartan-hydrochlorothiazide (HYZAAR) 100-12.5 MG tablet Take 1 tablet by mouth daily. 90 tablet 3  . metFORMIN (GLUCOPHAGE) 500 MG tablet Take 1 tablet (500 mg total) by mouth 2 (  two) times daily with a meal. 60 tablet 6  . [START ON 05/30/2019] sertraline (ZOLOFT) 100 MG tablet Take 1.5 tablets (150 mg total) by mouth daily. 135 tablet 0  . tiotropium (SPIRIVA) 18 MCG inhalation capsule Place 1 capsule (18 mcg total) into inhaler and inhale daily. 30 capsule 6  . [START ON 05/30/2019] traZODone (DESYREL) 100 MG tablet Take 1 tablet (100 mg total) by mouth at bedtime as needed for sleep. 90 tablet 0  . TRUEPLUS LANCETS 28G MISC Use as directed 100 each 2   No current facility-administered medications for this visit.     Psychiatric Specialty Exam: Review of Systems  Respiratory: Positive for shortness of breath.   All other systems reviewed and are negative.   There were no vitals taken for this visit.There is no height or weight on file to calculate BMI.  General Appearance: NA  Eye Contact:  NA  Speech:  Clear and Coherent and Normal Rate  Volume:  Normal  Mood:  Euthymic  Affect:  NA  Thought Process:  Goal Directed and Linear  Orientation:  Full (Time, Place, and Person)  Thought Content: Logical   Suicidal Thoughts:  No  Homicidal Thoughts:  No  Memory:  Immediate;   Good Recent;   Good Remote;   Good  Judgement:  Good  Insight:  Fair  Psychomotor Activity:  NA  Concentration:  Concentration: Good  Recall:  Good  Fund of Knowledge: Good  Language: Good  Akathisia:  Negative  Handed:  Right  AIMS (if indicated): not  done  Assets:  Communication Skills Desire for Improvement Housing Resilience  ADL's:  Intact  Cognition: WNL  Sleep:  Good   Screenings: GAD-7     Office Visit from 11/26/2018 in [facility] Office Visit from 07/01/2018 in [facility] Office Visit from 05/21/2018 in [facility] Office Visit from 04/13/2018 in [facility] Visit from 04/01/2018 in [facility]  Total GAD-7 Score  0  0  0  3  11    PHQ2-9     Office Visit from 11/26/2018 in [facility] Office Visit from 07/01/2018 in [facility] Office Visit from 05/21/2018 in [facility] from [facility] Office Visit from 04/13/2018 in [facility]  PHQ-2 Total Score  0  0  0  0  2  PHQ-9 Total Score  --  2  1  --  8       Assessment and Plan: 49 yo single male with approximately 2 year hx of increased anxiety, some situational depression and occasional spells/episodes of shaking, tunnel vision, SOB, dizziness, lightheadedness. Patient has been seen last year by Dr. Charlotte Crumb and has had brain CT and MRI as well as EEG done - all basically normal. Patient's depressed mood was originallytriggered by ultimatum given to him by mother/step-father last year that he either finds a job or will have to move out of their home. He works now at [employer] part time but financial stress is still present. He has considered applying for disabilityas he felt physically challenging to do his job. He has never felt suicidal and has no hx of inpatient psychiatric admissions. He denies having history of mania or psychosis. He has been taking sertraline since June  of 2019 -we increased the dose few months ago and he iscurrentlyat 127m. He  tolerates it well and feels that his anxiety has subsided. Prior to sertraline he has tried escitalopram - does not recall if it was helpful. He reports having good appetite and adequate sleep with trazodone (he has OSA and uses CPAP). GHursheladmits to a hx of alcohol use disorderbutquit on his own 4 years ago. He denies abusing street drugs now or in the past. Overall. He reports feeling "pretty stable" on current medications.  Dx: Generalized anxiety disorder; Depressive disorder unspecified; Alcohol use disorder in remission  Plan:Continuesertraline 150 mg dailyand trazodone 100 mg prn middle insomnia. Next appointment in 2 months.The plan was discussed with patient who had an opportunity to ask questions and these were all answered. I spend242mutes inphone consultationwiththe patient.    OlStephanie AcreMD 05/02/2019, 3:07 PM

## 2019-05-04 ENCOUNTER — Ambulatory Visit (INDEPENDENT_AMBULATORY_CARE_PROVIDER_SITE_OTHER): Payer: No Typology Code available for payment source | Admitting: Pulmonary Disease

## 2019-05-04 ENCOUNTER — Other Ambulatory Visit: Payer: Self-pay

## 2019-05-04 ENCOUNTER — Encounter: Payer: Self-pay | Admitting: Pulmonary Disease

## 2019-05-04 VITALS — BP 132/70 | HR 85 | Temp 98.4°F | Ht 69.0 in | Wt 333.4 lb

## 2019-05-04 DIAGNOSIS — G4733 Obstructive sleep apnea (adult) (pediatric): Secondary | ICD-10-CM

## 2019-05-04 DIAGNOSIS — J449 Chronic obstructive pulmonary disease, unspecified: Secondary | ICD-10-CM

## 2019-05-04 NOTE — Patient Instructions (Signed)
History of severe obstructive sleep apnea  Compliant with BiPAP use  We will try to get a download from your machine-this helps [name] with monitoring that it is working effectively  I will see you back in the office in about 6 months  Continue to work on your weight loss efforts Regular exercise will definitely help  Call with significant concerns Sleep Apnea Sleep apnea is a condition in which breathing pauses or becomes shallow during sleep. Episodes of sleep apnea usually last 10 seconds or longer, and they may occur as many as 20 times an hour. Sleep apnea disrupts your sleep and keeps your body from getting the rest that it needs. This condition can increase your risk of certain health problems, including:  Heart attack.  Stroke.  Obesity.  Diabetes.  Heart failure.  Irregular heartbeat. What are the causes? There are three kinds of sleep apnea:  Obstructive sleep apnea. This kind is caused by a blocked or collapsed airway.  Central sleep apnea. This kind happens when the part of the brain that controls breathing does not send the correct signals to the muscles that control breathing.  Mixed sleep apnea. This is a combination of obstructive and central sleep apnea. The most common cause of this condition is a collapsed or blocked airway. An airway can collapse or become blocked if:  Your throat muscles are abnormally relaxed.  Your tongue and tonsils are larger than normal.  You are overweight.  Your airway is smaller than normal. What increases the risk? You are more likely to develop this condition if you:  Are overweight.  Smoke.  Have a smaller than normal airway.  Are elderly.  Are male.  Drink alcohol.  Take sedatives or tranquilizers.  Have a family history of sleep apnea. What are the signs or symptoms? Symptoms of this condition include:  Trouble staying asleep.  Daytime sleepiness and tiredness.  Irritability.  Loud snoring.  Morning  headaches.  Trouble concentrating.  Forgetfulness.  Decreased interest in sex.  Unexplained sleepiness.  Mood swings.  Personality changes.  Feelings of depression.  Waking up often during the night to urinate.  Dry mouth.  Sore throat. How is this diagnosed? This condition may be diagnosed with:  A medical history.  A physical exam.  A series of tests that are done while you are sleeping (sleep study). These tests are usually done in a sleep lab, but they may also be done at home. How is this treated? Treatment for this condition aims to restore normal breathing and to ease symptoms during sleep. It may involve managing health issues that can affect breathing, such as high blood pressure or obesity. Treatment may include:  Sleeping on your side.  Using a decongestant if you have nasal congestion.  Avoiding the use of depressants, including alcohol, sedatives, and narcotics.  Losing weight if you are overweight.  Making changes to your diet.  Quitting smoking.  Using a device to open your airway while you sleep, such as: ? An oral appliance. This is a custom-made mouthpiece that shifts your lower jaw forward. ? A continuous positive airway pressure (CPAP) device. This device blows air through a mask when you breathe out (exhale). ? A nasal expiratory positive airway pressure (EPAP) device. This device has valves that you put into each nostril. ? A bi-level positive airway pressure (BPAP) device. This device blows air through a mask when you breathe in (inhale) and breathe out (exhale).  Having surgery if other treatments do not  work. During surgery, excess tissue is removed to create a wider airway. It is important to get treatment for sleep apnea. Without treatment, this condition can lead to:  High blood pressure.  Coronary artery disease.  In men, an inability to achieve or maintain an erection (impotence).  Reduced thinking abilities. Follow these  instructions at home: Lifestyle  Make any lifestyle changes that your health care provider recommends.  Eat a healthy, well-balanced diet.  Take steps to lose weight if you are overweight.  Avoid using depressants, including alcohol, sedatives, and narcotics.  Do not use any products that contain nicotine or tobacco, such as cigarettes, e-cigarettes, and chewing tobacco. If you need help quitting, ask your health care provider. General instructions  Take over-the-counter and prescription medicines only as told by your health care provider.  If you were given a device to open your airway while you sleep, use it only as told by your health care provider.  If you are having surgery, make sure to tell your health care provider you have sleep apnea. You may need to bring your device with you.  Keep all follow-up visits as told by your health care provider. This is important. Contact a health care provider if:  The device that you received to open your airway during sleep is uncomfortable or does not seem to be working.  Your symptoms do not improve.  Your symptoms get worse. Get help right away if:  You develop: ? Chest pain. ? Shortness of breath. ? Discomfort in your back, arms, or stomach.  You have: ? Trouble speaking. ? Weakness on one side of your body. ? Drooping in your face. These symptoms may represent a serious problem that is an emergency. Do not wait to see if the symptoms will go away. Get medical help right away. Call your local emergency services (911 in the [place] Do not drive yourself to the hospital. Summary  Sleep apnea is a condition in which breathing pauses or becomes shallow during sleep.  The most common cause is a collapsed or blocked airway.  The goal of treatment is to restore normal breathing and to ease symptoms during sleep. This information is not intended to replace advice given to you by your health care provider. Make sure you discuss any  questions you have with your health care provider. Document Revised: 09/15/2018 Document Reviewed: 11/24/2017 Elsevier Patient Education  [employer].

## 2019-05-04 NOTE — Progress Notes (Signed)
Juan Kline    [id number]    1970-09-15  Primary Care Physician:Johnson, Dalbert Batman, MD  Referring Physician: Ladell Pier, MD [address] [place],  [place] [address]  Chief complaint:   Patient with a history of obstructive lung disease History of obstructive sleep apnea diagnosed about 2019  HPI:  Diagnosed with severe obstructive sleep apnea Has been using BiPAP 19/15 Compliant with treatment  Does have mask issues Following awakening in the middle of the night sometimes not able to fall back asleep  Usually goes to bed between 12 and 2 Wakes up at about 9 AM Denies any headaches, does admit to dryness of the mouth Multiple awakenings No family history of obstructive sleep apnea  He is at his heaviest weight at present  No pets No occupational predisposition   Outpatient Encounter Medications as of 05/04/2019  Medication Sig  . albuterol (VENTOLIN HFA) 108 (90 Base) MCG/ACT inhaler Inhale 2 puffs into the lungs every 6 (six) hours as needed for wheezing or shortness of breath.  Marland Kitchen amLODipine (NORVASC) 10 MG tablet Take 1 tablet (10 mg total) by mouth daily.  Marland Kitchen atorvastatin (LIPITOR) 40 MG tablet Take 1 tablet (40 mg total) by mouth daily.  . Blood Glucose Monitoring Suppl (TRUE METRIX METER) w/Device KIT Use as directed  . glucose blood (TRUE METRIX BLOOD GLUCOSE TEST) test strip Use as instructed  . losartan-hydrochlorothiazide (HYZAAR) 100-12.5 MG tablet Take 1 tablet by mouth daily.  . metFORMIN (GLUCOPHAGE) 500 MG tablet Take 1 tablet (500 mg total) by mouth 2 (two) times daily with a meal.  . [START ON 05/30/2019] sertraline (ZOLOFT) 100 MG tablet Take 1.5 tablets (150 mg total) by mouth daily.  Marland Kitchen tiotropium (SPIRIVA) 18 MCG inhalation capsule Place 1 capsule (18 mcg total) into inhaler and inhale daily.  Derrill Memo ON 05/30/2019] traZODone (DESYREL) 100 MG tablet Take 1 tablet (100 mg total) by mouth at bedtime as needed for sleep. (Patient taking  differently: Take 100 mg by mouth at bedtime as needed for sleep. Patient takes 0.25 of tablet at bedtime as needed.)  . TRUEPLUS LANCETS 28G MISC Use as directed   No facility-administered encounter medications on file as of 05/04/2019.    Allergies as of 05/04/2019 - Review Complete 05/04/2019  Allergen Reaction Noted  . Lyrica [pregabalin] Anxiety 08/12/2016    Past Medical History:  Diagnosis Date  . Chronic foot ulcer ([facility]) admitted 03/02/2014   medial border first metatarsal left foot  . Diabetes ([place])   . Diabetic foot infection ([facility])   . Hyperlipidemia   . Hypertension Dx 2009  . OSA (obstructive sleep apnea)    CPAP  . Osteomyelitis left first metatarsal 03/02/2014  . Sleep apnea   . Venous stasis ulcer of left lower extremity ([place])    [name] 03/02/2014    Past Surgical History:  Procedure Laterality Date  . NO PAST SURGERIES      Family History  Problem Relation Age of Onset  . Hypertension Mother   . Hyperlipidemia Mother   . Diabetes Mother   . Kidney disease Mother   . Hypertension Father   . Hyperlipidemia Father   . Diabetes Brother   . Hypertension Brother   . Hyperlipidemia Brother   . Heart disease Maternal Grandmother   . COPD Maternal Grandmother   . Diabetes Maternal Grandfather   . Heart attack Paternal Grandmother   . Heart disease Paternal Grandfather  Social History   Socioeconomic History  . Marital status: Single    Spouse name: Not on file  . Number of children: 0  . Years of education: Not on file  . Highest education level: Not on file  Occupational History  . Not on file  Tobacco Use  . Smoking status: Current Some Day Smoker    Packs/day: 1.50    Years: 26.00    Pack years: 39.00    Types: E-cigarettes, Cigarettes    Start date: 1989    Last attempt to quit: 02/16/2014    Years since quitting: 5.2  . Smokeless tobacco: Never Used  Substance and Sexual Activity  . Alcohol use: Yes    Comment: 03/02/2014 "I rarely  drink"  . Drug use: No  . Sexual activity: Not Currently  Other Topics Concern  . Not on file  Social History Narrative   Pt lives in 1 story home with his mother and step father   9th grade education   Last occupation was with [employer] in 2009      Unemployed at this time- thinking of filing for disability      Right handed      Social Determinants of Health   Financial Resource Strain:   . Difficulty of Paying Living Expenses: Not on file  Food Insecurity:   . Worried About Charity fundraiser in the Last Year: Not on file  . Ran Out of Food in the Last Year: Not on file  Transportation Needs:   . Lack of Transportation (Medical): Not on file  . Lack of Transportation (Non-Medical): Not on file  Physical Activity:   . Days of Exercise per Week: Not on file  . Minutes of Exercise per Session: Not on file  Stress:   . Feeling of Stress : Not on file  Social Connections:   . Frequency of Communication with Friends and Family: Not on file  . Frequency of Social Gatherings with Friends and Family: Not on file  . Attends Religious Services: Not on file  . Active Member of Clubs or Organizations: Not on file  . Attends Archivist Meetings: Not on file  . Marital Status: Not on file  Intimate Partner Violence:   . Fear of Current or Ex-Partner: Not on file  . Emotionally Abused: Not on file  . Physically Abused: Not on file  . Sexually Abused: Not on file    Review of Systems  Constitutional: Positive for fatigue.  HENT: Negative.   Eyes: Negative.   Respiratory: Positive for shortness of breath. Negative for cough.   Cardiovascular: Negative.   Gastrointestinal: Negative.   Endocrine: Negative.   Genitourinary: Negative.     Vitals:   05/04/19 1331  BP: 132/70  Pulse: 85  Temp: 98.4 F (36.9 C)  SpO2: 96%     Physical Exam  Constitutional: He is oriented to person, place, and time. He appears well-developed.  Morbidly obese  HENT:  Head:  Normocephalic and atraumatic.  Mouth/Throat: No oropharyngeal exudate.  Mallampati 4, crowded oropharynx  Eyes: Pupils are equal, round, and reactive to light. Right eye exhibits no discharge.  Neck: No tracheal deviation present. No thyromegaly present.  Neck is thick  Cardiovascular: Normal rate.  Pulmonary/Chest: Effort normal and breath sounds normal. No respiratory distress. He has no wheezes. He has no rales.  Musculoskeletal:        General: No edema. Normal range of motion.     Cervical back: Normal  range of motion and neck supple.  Neurological: He is alert and oriented to person, place, and time. No cranial nerve deficit.  Skin: Skin is warm and dry. No erythema.  Psychiatric: He has a normal mood and affect.   Data Reviewed: Sleep study reviewed Titration study reviewed showing titration to 19/15 BiPAP  Assessment:  Severe obstructive sleep apnea -On BiPAP 19/15  Moderately severe obstructive lung disease -On bronchodilators -Breathing is stable currently  Morbid obesity -Has an appointment to see dietitian Encouraged to start working on weight loss efforts  Plan/Recommendations: Pathophysiology of sleep disordered breathing discussed with the patient Contribution of significant obesity to obstructive sleep apnea was discussed Treatment options for sleep disordered breathing discussed  Importance of compliance with BiPAP was discussed  We will try and obtain a download from his machine  We will contact DME company for this  Encouraged to call with any significant concerns Encouraged to work on weight loss efforts   Sherrilyn Rist MD [facility] 05/04/2019, 2:03 PM  CC: Ladell Pier, MD

## 2019-05-09 MED FILL — ATORVASTATIN CALCIUM 40 MG: 40 | 30 days supply | Qty: 30 | Fill #2

## 2019-05-11 ENCOUNTER — Ambulatory Visit: Payer: No Typology Code available for payment source | Admitting: Pulmonary Disease

## 2019-05-16 MED FILL — AMLODIPINE BESYLATE 10 MG T: 10 | 30 days supply | Qty: 30 | Fill #2

## 2019-05-16 MED FILL — LOSARTAN-HCTZ 100-12.5 MG T: 100-12.5 | 30 days supply | Qty: 30 | Fill #2

## 2019-05-16 MED FILL — ?METFORMIN HCL 500MG TABLET: 500 | 30 days supply | Qty: 60 | Fill #1

## 2019-05-17 ENCOUNTER — Ambulatory Visit: Payer: Self-pay | Attending: Nurse Practitioner | Admitting: Nurse Practitioner

## 2019-05-17 ENCOUNTER — Encounter: Payer: Self-pay | Attending: Internal Medicine | Admitting: Registered"

## 2019-05-17 ENCOUNTER — Encounter: Payer: Self-pay | Admitting: Registered"

## 2019-05-17 ENCOUNTER — Other Ambulatory Visit: Payer: Self-pay

## 2019-05-17 ENCOUNTER — Encounter: Payer: Self-pay | Admitting: Nurse Practitioner

## 2019-05-17 DIAGNOSIS — E1142 Type 2 diabetes mellitus with diabetic polyneuropathy: Secondary | ICD-10-CM | POA: Insufficient documentation

## 2019-05-17 DIAGNOSIS — M791 Myalgia, unspecified site: Secondary | ICD-10-CM

## 2019-05-17 MED ORDER — CYCLOBENZAPRINE HCL 5 MG PO TABS: 5.0000 mg | ORAL_TABLET | Freq: Three times a day (TID) | ORAL | 0 refills | Status: DC | PRN

## 2019-05-17 MED ORDER — NAPROXEN 500 MG PO TABS: 500.0000 mg | ORAL_TABLET | Freq: Two times a day (BID) | ORAL | 0 refills | Status: DC

## 2019-05-17 MED FILL — CYCLOBENZAPRINE 5 MG TABLET: 5 | 10 days supply | Qty: 30 | Fill #0

## 2019-05-17 MED FILL — ?NAPROXEN 500 MG TABS: 500 | 15 days supply | Qty: 30 | Fill #0

## 2019-05-17 NOTE — Progress Notes (Signed)
Virtual Visit via Telephone Note Due to national recommendations of social distancing due to [place] 19, telehealth visit is felt to be most appropriate for this patient at this time.  I discussed the limitations, risks, security and privacy concerns of performing an evaluation and management service by telephone and the availability of in person appointments. I also discussed with the patient that there may be a patient responsible charge related to this service. The patient expressed understanding and agreed to proceed.    I connected with Seymour Bars on 05/17/19  at   8:30 AM EST  EDT by telephone and verified that I am speaking with the correct person using two identifiers.   Consent I discussed the limitations, risks, security and privacy concerns of performing an evaluation and management service by telephone and the availability of in person appointments. I also discussed with the patient that there may be a patient responsible charge related to this service. The patient expressed understanding and agreed to proceed.   Location of Patient: Private Residence   Location of Provider: [place] and [place] participating in Telemedicine visit: Geryl Rankins FNP-BC [place]    History of Present Illness: Telemedicine visit for: Muscle Spasms   Endorses chronic intermittent back pain however recently worsened on Sunday January 31. He denies any injury or trauma. Feels like a muscle spasm he has been treated for before several years ago. States muscle relaxants and inflammation medications usually work. Pain is in the lower left lumbar area. There is no sciatica or involuntary loss of bowel or bladder.  Called in to work on Sunday and Monday and requesting a note to return to work on Saturday. States he is off today and scheduled to return tomorrow and then off again Thursday and Friday. Aggravating Factors: Activities. Relieving Factors: Rest.     Past Medical History:  Diagnosis Date  . Chronic foot ulcer ([place]) admitted 03/02/2014   medial border first metatarsal left foot  . Diabetes ([place])   . Diabetic foot infection ([place])   . Hyperlipidemia   . Hypertension Dx 2009  . OSA (obstructive sleep apnea)    CPAP  . Osteomyelitis left first metatarsal 03/02/2014  . Sleep apnea   . Venous stasis ulcer of left lower extremity ([place])    [name] 03/02/2014    Past Surgical History:  Procedure Laterality Date  . NO PAST SURGERIES      Family History  Problem Relation Age of Onset  . Hypertension Mother   . Hyperlipidemia Mother   . Diabetes Mother   . Kidney disease Mother   . Hypertension Father   . Hyperlipidemia Father   . Diabetes Brother   . Hypertension Brother   . Hyperlipidemia Brother   . Heart disease Maternal Grandmother   . COPD Maternal Grandmother   . Diabetes Maternal Grandfather   . Heart attack Paternal Grandmother   . Heart disease Paternal Grandfather     Social History   Socioeconomic History  . Marital status: Single    Spouse name: Not on file  . Number of children: 0  . Years of education: Not on file  . Highest education level: Not on file  Occupational History  . Not on file  Tobacco Use  . Smoking status: Current Some Day Smoker    Packs/day: 1.50    Years: 26.00    Pack years: 39.00    Types: E-cigarettes, Cigarettes    Start date: 1989  Last attempt to quit: 02/16/2014    Years since quitting: 5.2  . Smokeless tobacco: Never Used  Substance and Sexual Activity  . Alcohol use: Yes    Comment: 03/02/2014 "I rarely drink"  . Drug use: No  . Sexual activity: Not Currently  Other Topics Concern  . Not on file  Social History Narrative   Pt lives in 1 story home with his mother and step father   9th grade education   Last occupation was with [employer] in 2009      Unemployed at this time- thinking of filing for disability      Right handed      Social Determinants of  Health   Financial Resource Strain:   . Difficulty of Paying Living Expenses: Not on file  Food Insecurity:   . Worried About Charity fundraiser in the Last Year: Not on file  . Ran Out of Food in the Last Year: Not on file  Transportation Needs:   . Lack of Transportation (Medical): Not on file  . Lack of Transportation (Non-Medical): Not on file  Physical Activity:   . Days of Exercise per Week: Not on file  . Minutes of Exercise per Session: Not on file  Stress:   . Feeling of Stress : Not on file  Social Connections:   . Frequency of Communication with Friends and Family: Not on file  . Frequency of Social Gatherings with Friends and Family: Not on file  . Attends Religious Services: Not on file  . Active Member of Clubs or Organizations: Not on file  . Attends Archivist Meetings: Not on file  . Marital Status: Not on file     Observations/Objective: Awake, alert and oriented x 3   Review of Systems  Constitutional: Negative for fever, malaise/fatigue and weight loss.  HENT: Negative.  Negative for nosebleeds.   Eyes: Negative.  Negative for blurred vision, double vision and photophobia.  Respiratory: Negative.  Negative for cough and shortness of breath.   Cardiovascular: Negative.  Negative for chest pain, palpitations and leg swelling.  Gastrointestinal: Negative.  Negative for heartburn, nausea and vomiting.  Musculoskeletal: Positive for back pain and myalgias.  Neurological: Negative.  Negative for dizziness, focal weakness, seizures and headaches.  Psychiatric/Behavioral: Negative.  Negative for suicidal ideas.    Assessment and Plan: Suhas was seen today for back pain.  Diagnoses and all orders for this visit:  Myalgia -     cyclobenzaprine (FLEXERIL) 5 MG tablet; Take 1 tablet (5 mg total) by mouth 3 (three) times daily as needed for muscle spasms. -     naproxen (NAPROSYN) 500 MG tablet; Take 1 tablet (500 mg total) by mouth 2 (two) times daily  with a meal. Work on losing weight to help reduce back pain. May alternate with heat and ice application for pain relief. May also alternate with acetaminophen, use heating pad and analgesic patches  for back pain. Other alternatives include massage, acupuncture and water aerobics.  You must stay active and avoid a sedentary lifestyle.     Follow Up Instructions Return in about 7 weeks (around 07/05/2019) for f/u with PCP at next scheduled office visit.     I discussed the assessment and treatment plan with the patient. The patient was provided an opportunity to ask questions and all were answered. The patient agreed with the plan and demonstrated an understanding of the instructions.   The patient was advised to call back or seek an  in-person evaluation if the symptoms worsen or if the condition fails to improve as anticipated.  I provided 14 minutes of non-face-to-face time during this encounter including median intraservice time, reviewing previous notes, labs, imaging, medications and explaining diagnosis and management.  Gildardo Pounds, FNP-BC

## 2019-05-17 NOTE — Patient Instructions (Signed)
Consider getting on a regular schedule. Start with the plan we drafted on the back of your weekly meal plan calendar. Consider sleep tips and getting to bed at a regular time and waking up at a regular time. Make the meal before work your main meal, have a protein shake for a snack at work, smaller dinner. Consider increasing your physical activity. Can use arm chair exercises and resistance bands. Checking your blood sugar: 2 hours after meals that you are curious how your body handled the carbohydrates. Should be 170 or less. Also can check fasting, before you eat on occasion and that number 125 or less.

## 2019-05-17 NOTE — Progress Notes (Signed)
Diabetes Self-Management Education  Visit Type: First/Initial  Appt. Start Time: 1400 Appt. End Time: [id number]  05/17/2019  [name], identified by name and date of birth, is a 49 y.o. male with a diagnosis of Diabetes: Type 2.   ASSESSMENT  There were no vitals taken for this visit. There is no height or weight on file to calculate BMI.   SMBG: Patient states he checks BG at random times and is in low to mid 100s.  Pt states he needs a plan to help him eat better. Pt states he doesn't cook. Pt is working part-time 4-8 pm and often is very hungry when he gets off work 9pm or later. Patient likes most foods and open to trying new foods.  Physical Activity: Patient states he does not feel safe exercising d/t dizzy spells. Pt states he has been told his symptoms may be r/t anxiety and/or panic attacks.   Complication Risk management: pt states he doesn't have insurance but plans to get an eye exam soon. Has not had a dental exam in awhile. Pt states he needs to update his [employer] but not sure if it covers dental exams. RD may discuss free clinics next visit.  Diabetes Self-Management Education - 05/17/19 1409      Visit Information   Visit Type  First/Initial      Initial Visit   Diabetes Type  Type 2    Are you currently following a meal plan?  No    Are you taking your medications as prescribed?  Yes   metformin bid   Date Diagnosed  2 yrs ago      Health Coping   How would you rate your overall health?  Poor      Psychosocial Assessment   Patient Belief/Attitude about Diabetes  Motivated to manage diabetes    How often do you need to have someone help you when you read instructions, pamphlets, or other written materials from your doctor or pharmacy?  1 - Never    What is the last grade level you completed in school?  9      Complications   Last HgB A1C per patient/outside source  6.6 %    How often do you check your blood sugar?  1-2 times/day   checks randomly, low  to mid 100s   Have you had a dilated eye exam in the past 12 months?  No    Have you had a dental exam in the past 12 months?  No    Are you checking your feet?  Yes    How many days per week are you checking your feet?  7      Dietary Intake   Breakfast  cereal OR   9-10 am   Lunch  sandwich, chips and humus    Dinner  sandwich OR spaghetti, meat sauce    Beverage(s)  sparkling water, occassional diet soda      Exercise   Exercise Type  ADL's      Patient Education   Previous Diabetes Education  Yes (please comment)   Jan 2020   Nutrition management   Meal options for control of blood glucose level and chronic complications.;Role of diet in the treatment of diabetes and the relationship between the three main macronutrients and blood glucose level    Physical activity and exercise   Role of exercise on diabetes management, blood pressure control and cardiac health.      Individualized Goals (developed by  patient)   Nutrition  Follow meal plan discussed    Physical Activity  15 minutes per day      Outcomes   Expected Outcomes  Demonstrated interest in learning. Expect positive outcomes    Future DMSE  4-6 wks    Program Status  Not Completed       Individualized Plan for Diabetes Self-Management Training:   Learning Objective:  Patient will have a greater understanding of diabetes self-management. Patient education plan is to attend individual and/or group sessions per assessed needs and concerns.   Patient Instructions  Consider getting on a regular schedule. Start with the plan we drafted on the back of your weekly meal plan calendar. Consider sleep tips and getting to bed at a regular time and waking up at a regular time. Make the meal before work your main meal, have a protein shake for a snack at work, smaller dinner. Consider increasing your physical activity. Can use arm chair exercises and resistance bands. Checking your blood sugar: 2 hours after meals that you  are curious how your body handled the carbohydrates. Should be 170 or less. Also can check fasting, before you eat on occasion and that number 125 or less.  Back of weekly meal plan includes getting up at 9 am and going for a 10 min walk outside to get exercise and sunshine. Eating main meal 2:30pm before work. Also discussed eating slower, more mindful.  Expected Outcomes:  Demonstrated interest in learning. Expect positive outcomes  Education material provided: My Plate and Snack sheet, sleep hygiene, Meal Ideas  If problems or questions, patient to contact team via:  Phone and MyChart  Future DSME appointment: 4-6 wks

## 2019-05-23 ENCOUNTER — Telehealth: Payer: Self-pay | Admitting: Internal Medicine

## 2019-05-23 NOTE — Telephone Encounter (Signed)
Will forward to pcp

## 2019-05-23 NOTE — Telephone Encounter (Signed)
Pt called stating he is having a lot of back pain, he says his medications - cyclobenzaprine (FLEXERIL) 5 MG tablet  -naproxen (NAPROSYN) 500 MG tablet  Are not working and only making him sleepy, he missed work today 05/23/19 and will miss work tomorrow 05/24/19 and would like a letter written for his employer stating the reason he is out. Please advise

## 2019-05-24 NOTE — Telephone Encounter (Signed)
Could you please schedule pt a tele visit

## 2019-05-25 LAB — HM DIABETES EYE EXAM

## 2019-05-26 ENCOUNTER — Ambulatory Visit: Payer: Self-pay | Attending: Internal Medicine | Admitting: Internal Medicine

## 2019-05-26 ENCOUNTER — Other Ambulatory Visit: Payer: Self-pay

## 2019-05-26 ENCOUNTER — Encounter: Payer: Self-pay | Admitting: Internal Medicine

## 2019-05-26 VITALS — BP 134/81 | HR 95 | Temp 97.5°F | Resp 16 | Wt 340.6 lb

## 2019-05-26 DIAGNOSIS — R109 Unspecified abdominal pain: Secondary | ICD-10-CM

## 2019-05-26 LAB — POCT URINALYSIS DIP (CLINITEK)
Bilirubin, UA: NEGATIVE
Blood, UA: NEGATIVE
Glucose, UA: NEGATIVE mg/dL
Ketones, POC UA: NEGATIVE mg/dL
Leukocytes, UA: NEGATIVE
Nitrite, UA: NEGATIVE
POC PROTEIN,UA: 100 — AB
Spec Grav, UA: 1.025 (ref 1.010–1.025)
Urobilinogen, UA: 0.2 E.U./dL
pH, UA: 7 (ref 5.0–8.0)

## 2019-05-26 MED ORDER — TRAMADOL HCL 50 MG PO TABS: 50.0000 mg | ORAL_TABLET | Freq: Three times a day (TID) | ORAL | 0 refills | Status: AC | PRN

## 2019-05-26 MED FILL — traMADol HCL 50 MG TABS: 50 | 5 days supply | Qty: 15 | Fill #0

## 2019-05-26 NOTE — Progress Notes (Signed)
Patient ID: Juan Kline, male    DOB: [birth year]  MRN: [id number]  CC: Back Pain and Letter for School/Work   Subjective: Juan Kline is a 49 y.o. male who presents for eval of back pain His concerns today include:  HTN, HL,DM type 2with neuropathy,anxiety, chronic fatigue, severe OSA, vapes, mix COPD, obesity   C/o LT sided back pain for 10 days Thought he was having spasms.  Spoke with our NP on a tele-visit and was prescribed Naprosyn and Flexeril which have not helped Pain in LT flank and radiates around to the LT side.  Feels like he has been punched in the ribs No dysuria or hematuria.  No N/V.  Moving bowels okay.  Better when sitting still.  Feels it with rotating the trunk, bending the trunk and with ambulation.  "Feels like a throb." Sometimes hurts with deep breath "but most of the time not."  Denies cough or fever or SOB.  no recent long distance travel or swelling in legs.  Some burning sensation in back and increase in legs.   Day before the pain started he was loading and unloading some chopped up firewood.  Most were no more than 3 lbs but "I did a lot of it." Rates pain as 10/10.  Does not feel that Naprosyn or Flexeril helps.  Out of work since 05/15/2019.  Works at [employer].  Works 10-15 hrs a wk but lately a bit more as they have been short on staff.  Reports he was taken off schedule for next wk by his boss.    Patient Active Problem List   Diagnosis Date Noted  . 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 Positive for macroalbuminuria 03/06/2019  . Vapes nicotine containing substance 03/03/2019  . Class 3 severe obesity due to excess calories without serious comorbidity with body mass index (BMI) of 45.0 to 49.9 in adult [facility]) 03/03/2019  . Hyperlipidemia associated with type 2 diabetes mellitus ([place]) 03/03/2019  . Type 2 diabetes mellitus with diabetic polyneuropathy, without long-term current use of insulin ([place]) 04/13/2018  . Mild depression ([place]) 04/01/2018  .  OSA (obstructive sleep apnea) 10/26/2017  . Anxiety disorder 10/26/2017  . Peripheral neuropathy 12/12/2016  . GAD (generalized anxiety disorder) 05/11/2015  . Former smoker 05/11/2015  . Other hyperlipidemia 05/22/2014  . Vitamin D insufficiency 05/22/2014  . Numbness and tingling of foot 05/19/2014  . Venous stasis dermatitis of both lower extremities 05/19/2014  . history of Hypertension 03/02/2014  . Morbid obesity ([place]) 03/02/2014     Current Outpatient Medications on File Prior to Visit  Medication Sig Dispense Refill  . albuterol (VENTOLIN HFA) 108 (90 Base) MCG/ACT inhaler Inhale 2 puffs into the lungs every 6 (six) hours as needed for wheezing or shortness of breath. 6.7 g 11  . amLODipine (NORVASC) 10 MG tablet Take 1 tablet (10 mg total) by mouth daily. 30 tablet 6  . atorvastatin (LIPITOR) 40 MG tablet Take 1 tablet (40 mg total) by mouth daily. 90 tablet 3  . Blood Glucose Monitoring Suppl (TRUE METRIX METER) w/Device KIT Use as directed 1 kit 0  . cyclobenzaprine (FLEXERIL) 5 MG tablet Take 1 tablet (5 mg total) by mouth 3 (three) times daily as needed for muscle spasms. 30 tablet 0  . glucose blood (TRUE METRIX BLOOD GLUCOSE TEST) test strip Use as instructed 100 each 12  . losartan-hydrochlorothiazide (HYZAAR) 100-12.5 MG tablet Take 1 tablet by mouth daily. 90 tablet 3  . metFORMIN (GLUCOPHAGE)  500 MG tablet Take 1 tablet (500 mg total) by mouth 2 (two) times daily with a meal. 60 tablet 6  . naproxen (NAPROSYN) 500 MG tablet Take 1 tablet (500 mg total) by mouth 2 (two) times daily with a meal. 30 tablet 0  . [START ON 05/30/2019] sertraline (ZOLOFT) 100 MG tablet Take 1.5 tablets (150 mg total) by mouth daily. 135 tablet 0  . tiotropium (SPIRIVA) 18 MCG inhalation capsule Place 1 capsule (18 mcg total) into inhaler and inhale daily. 30 capsule 6  . [START ON 05/30/2019] traZODone (DESYREL) 100 MG tablet Take 1 tablet (100 mg total) by mouth at bedtime as needed for sleep.  (Patient taking differently: Take 100 mg by mouth at bedtime as needed for sleep. Patient takes 0.25 of tablet at bedtime as needed.) 90 tablet 0  . TRUEPLUS LANCETS 28G MISC Use as directed 100 each 2   No current facility-administered medications on file prior to visit.    Allergies  Allergen Reactions  . Lyrica [Pregabalin] Anxiety    Social History   Socioeconomic History  . Marital status: Single    Spouse name: Not on file  . Number of children: 0  . Years of education: Not on file  . Highest education level: Not on file  Occupational History  . Not on file  Tobacco Use  . Smoking status: Current Some Day Smoker    Packs/day: 1.50    Years: 26.00    Pack years: 39.00    Types: E-cigarettes, Cigarettes    Start date: 1989    Last attempt to quit: 02/16/2014    Years since quitting: 5.2  . Smokeless tobacco: Never Used  Substance and Sexual Activity  . Alcohol use: Yes    Comment: 03/02/2014 "I rarely drink"  . Drug use: No  . Sexual activity: Not Currently  Other Topics Concern  . Not on file  Social History Narrative   Pt lives in 1 story home with his mother and step father   9th grade education   Last occupation was with [employer] in 2009      Unemployed at this time- thinking of filing for disability      Right handed      Social Determinants of Health   Financial Resource Strain:   . Difficulty of Paying Living Expenses: Not on file  Food Insecurity:   . Worried About Charity fundraiser in the Last Year: Not on file  . Ran Out of Food in the Last Year: Not on file  Transportation Needs:   . Lack of Transportation (Medical): Not on file  . Lack of Transportation (Non-Medical): Not on file  Physical Activity:   . Days of Exercise per Week: Not on file  . Minutes of Exercise per Session: Not on file  Stress:   . Feeling of Stress : Not on file  Social Connections:   . Frequency of Communication with Friends and Family: Not on file  . Frequency of  Social Gatherings with Friends and Family: Not on file  . Attends Religious Services: Not on file  . Active Member of Clubs or Organizations: Not on file  . Attends Archivist Meetings: Not on file  . Marital Status: Not on file  Intimate Partner Violence:   . Fear of Current or Ex-Partner: Not on file  . Emotionally Abused: Not on file  . Physically Abused: Not on file  . Sexually Abused: Not on file    Family  History  Problem Relation Age of Onset  . Hypertension Mother   . Hyperlipidemia Mother   . Diabetes Mother   . Kidney disease Mother   . Hypertension Father   . Hyperlipidemia Father   . Diabetes Brother   . Hypertension Brother   . Hyperlipidemia Brother   . Heart disease Maternal Grandmother   . COPD Maternal Grandmother   . Diabetes Maternal Grandfather   . Heart attack Paternal Grandmother   . Heart disease Paternal Grandfather     Past Surgical History:  Procedure Laterality Date  . NO PAST SURGERIES      ROS: Review of Systems Negative except as stated above  PHYSICAL EXAM: BP 134/81   Pulse 95   Temp (!) 97.5 F (36.4 C)   Resp 16   Wt (!) 340 lb 9.6 oz (154.5 kg)   SpO2 95%   BMI 50.30 kg/m   Physical Exam  General appearance - alert, well appearing, obese Caucasian male and in no distress Mental status - normal mood, behavior, speech, dress, motor activity, and thought processes Chest -breath sounds are equal and clear bilaterally Heart - normal rate, regular rhythm, normal S1, S2, no murmurs, rubs, clicks or gallops Musculoskeletal -mild tenderness on palpation of the left flank area and the left anterior lateral rib cage Abdomen -obese, normal bowel sounds, soft and nontender  Results for orders placed or performed in visit on 05/26/19  POCT URINALYSIS DIP (CLINITEK)  Result Value Ref Range   Color, UA yellow yellow   Clarity, UA clear clear   Glucose, UA negative negative mg/dL   Bilirubin, UA negative negative   Ketones,  POC UA negative negative mg/dL   Spec Grav, UA 1.025 1.010 - 1.025   Blood, UA negative negative   pH, UA 7.0 5.0 - 8.0   POC PROTEIN,UA =100 (A) negative, trace   Urobilinogen, UA 0.2 0.2 or 1.0 E.U./dL   Nitrite, UA Negative Negative   Leukocytes, UA Negative Negative    CMP Latest Ref Rng & Units 10/08/2018 11/16/2017 10/06/2017  Glucose 65 - 99 mg/dL 109(H) 133(H) 148(H)  BUN 6 - 24 mg/dL 22 25(H) 29(H)  Creatinine 0.76 - 1.27 mg/dL 0.77 0.90 1.04  Sodium 134 - 144 mmol/L 137 138 138  Potassium 3.5 - 5.2 mmol/L 4.4 3.5 3.8  Chloride 96 - 106 mmol/L 100 101 100  CO2 20 - 29 mmol/L 21 - 27  Calcium 8.7 - 10.2 mg/dL 9.0 - 9.5  Total Protein 6.0 - 8.5 g/dL 6.9 - -  Total Bilirubin 0.0 - 1.2 mg/dL <0.2 - -  Alkaline Phos 39 - 117 IU/L 46 - -  AST 0 - 40 IU/L 18 - -  ALT 0 - 44 IU/L 16 - -   Lipid Panel     Component Value Date/Time   CHOL 200 (H) 03/04/2019 1107   TRIG 255 (H) 03/04/2019 1107   HDL 46 03/04/2019 1107   CHOLHDL 4.3 03/04/2019 1107   CHOLHDL 5.6 (H) 02/28/2016 1244   VLDL 65 (H) 02/28/2016 1244   LDLCALC 110 (H) 03/04/2019 1107    CBC    Component Value Date/Time   WBC 8.5 10/08/2018 1407   WBC 11.1 (H) 11/16/2017 0042   RBC 4.99 10/08/2018 1407   RBC 4.83 11/16/2017 0042   HGB 14.0 10/08/2018 1407   HCT 44.0 10/08/2018 1407   PLT 320 10/08/2018 1407   MCV 88 10/08/2018 1407   MCH 28.1 10/08/2018 1407   MCH 31.1  11/16/2017 0042   MCHC 31.8 10/08/2018 1407   MCHC 34.4 11/16/2017 0042   RDW 14.3 10/08/2018 1407   LYMPHSABS 1.6 11/16/2017 0042   MONOABS 0.7 11/16/2017 0042   EOSABS 0.1 11/16/2017 0042   BASOSABS 0.0 11/16/2017 0042    ASSESSMENT AND PLAN:  1. Flank pain, acute Likely musculoskeletal in nature and may have been due to repetitive twisting motion of the trunk when he was loading and unloading firewood. Recommend use of a heating pad Will give limited supply of tramadol to use as needed with the Naprosyn and Flexeril. Advised that  tramadol can cause drowsiness. Advised to hold off on taking trazodone while taking tramadol as they can have an added serotonergic effect. -Work excuse given until 06/05/2019 - POCT URINALYSIS DIP (CLINITEK)   Patient was given the opportunity to ask questions.  Patient verbalized understanding of the plan and was able to repeat key elements of the plan.   Orders Placed This Encounter  Procedures  . POCT URINALYSIS DIP (CLINITEK)     Requested Prescriptions   Signed Prescriptions Disp Refills  . traMADol (ULTRAM) 50 MG tablet 15 tablet 0    Sig: Take 1 tablet (50 mg total) by mouth every 8 (eight) hours as needed for up to 5 days.    No follow-ups on file.  Karle Plumber, MD, FACP

## 2019-05-26 NOTE — Patient Instructions (Signed)
Use the heating pad to your back a few times a day. Try to avoid doing any heavy lifting over the next week. Continue using the Naprosyn and the Flexeril  as needed. I have added tramadol which is a narcotic medication to use for several days.

## 2019-06-06 ENCOUNTER — Other Ambulatory Visit: Payer: Self-pay

## 2019-06-06 ENCOUNTER — Encounter: Payer: Self-pay | Admitting: Critical Care Medicine

## 2019-06-06 ENCOUNTER — Ambulatory Visit (INDEPENDENT_AMBULATORY_CARE_PROVIDER_SITE_OTHER): Payer: Self-pay | Admitting: Critical Care Medicine

## 2019-06-06 DIAGNOSIS — R0602 Shortness of breath: Secondary | ICD-10-CM

## 2019-06-06 MED FILL — ATORVASTATIN CALCIUM 40 MG: 40 | 30 days supply | Qty: 30 | Fill #3

## 2019-06-06 NOTE — Patient Instructions (Addendum)
Thank you for visiting Dr. Carlis Abbott at [facility]. We recommend the following:  Return in about 1 year (around 06/05/2020).    Please do your part to reduce the spread of COVID-19.

## 2019-06-06 NOTE — Progress Notes (Signed)
Synopsis: Referred in November 2020 for Juan Kline by Ladell Pier, MD.  Subjective:   PATIENT ID: Juan Kline GENDER: male DOB: 14-Dec-1970, MRN: [id number]  Chief Complaint  Patient presents with  . Follow-up    Patient is doing better since last visit. Hasn't good to use rescue inhaler as much and has been doing good since being on Spiriva.    Juan Kline is a 49 year old gentleman with a history of chronic dyspnea on exertion, tobacco abuse, and vaping who presents for follow-up.  Since his last visit he was seen by Dr. Ander Slade for sleep medicine on 05/04/19/and has been compliant with his BiPAP 19/15.  He had a follow-up appointment on 04/04/2019 and has been doing well since. He has stopped vaping since his last visit. He feels improved since starting on spiriva once daily. He no longer has DOE with activity. He has not required his rescue inhaler since starting Spiriva. He is feeling well overall and is to start working on changing his diet and starting exercise with an effort of losing weight.     OV 02/14/2019: Juan Kline is a 50 year old gentleman who presents for evaluation of shortness of Kline.  This has been going on for the past 2 to 3 months and has remained stable throughout.  He noticed the shortness of Kline occasionally when sitting still, but predominantly with exertion. His activity is limited due to dyspnea. He thinks that he can walk about a block slowly, and is able to walk around his house.  Some days he notices his symptoms more than others, but does not notice changes with weather.  He sometimes has dizziness with his dyspnea, but denies chest pain.  He denies cough, sputum production, wheezing.  He has occasional GERD, which he relates to taking amlodipine.  He currently vapes, using about 3 pods per day, which are compounded by someone at a local store, not a name brand.  He feels that stress could be a barrier to quitting.  He quit smoking cigarettes about 5  years ago after 25 years of 1-1.5 packs/day.  He is very inactive at baseline.  His cardiologist was concerned that he has a significant amount of deconditioning and obesity precipitating his dyspnea.  He has had panic attacks in the past, but thinks that this feels different.  He has gained about 30 pounds in the last few months, but has slowly been gaining weight for several years.  He had been this heavy several years ago, but was able to lose 40 to 60 pounds after a diabetic foot infection.  He is interested at quitting vaping-would like to titrate off rather than trying nicotine replacement therapy.  He has never had PFTs or been prescribed inhalers. He does not have allergies or history of asthma.  He had a grandmother with COPD.  He has had his seasonal flu shot but needs his pneumonia shot.     Past Medical History:  Diagnosis Date  . Chronic foot ulcer ([place]) admitted 03/02/2014   medial border first metatarsal left foot  . Diabetes ([place])   . Diabetic foot infection ([place])   . Hyperlipidemia   . Hypertension Dx 2009  . OSA (obstructive sleep apnea)    CPAP  . Osteomyelitis left first metatarsal 03/02/2014  . Sleep apnea   . Venous stasis ulcer of left lower extremity ([place])    [name] 03/02/2014     Family History  Problem Relation Age of Onset  . Hypertension Mother   .  Hyperlipidemia Mother   . Diabetes Mother   . Kidney disease Mother   . Hypertension Father   . Hyperlipidemia Father   . Diabetes Brother   . Hypertension Brother   . Hyperlipidemia Brother   . Heart disease Maternal Grandmother   . COPD Maternal Grandmother   . Diabetes Maternal Grandfather   . Heart attack Paternal Grandmother   . Heart disease Paternal Grandfather      Past Surgical History:  Procedure Laterality Date  . NO PAST SURGERIES    confirmed- no previous surgical history  Social History   Socioeconomic History  . Marital status: Single    Spouse name: Not on file  . Number of  children: 0  . Years of education: Not on file  . Highest education level: Not on file  Occupational History  . Not on file  Tobacco Use  . Smoking status: Current Some Day Smoker    Packs/day: 1.50    Years: 26.00    Pack years: 39.00    Types: E-cigarettes, Cigarettes    Start date: 1989    Last attempt to quit: 02/16/2014    Years since quitting: 5.3  . Smokeless tobacco: Never Used  Substance and Sexual Activity  . Alcohol use: Yes    Comment: 03/02/2014 "I rarely drink"  . Drug use: No  . Sexual activity: Not Currently  Other Topics Concern  . Not on file  Social History Narrative   Pt lives in 1 story home with his mother and step father   9th grade education   Last occupation was with [employer] in 2009      Unemployed at this time- thinking of filing for disability      Right handed      Social Determinants of Health   Financial Resource Strain:   . Difficulty of Paying Living Expenses: Not on file  Food Insecurity:   . Worried About Charity fundraiser in the Last Year: Not on file  . Ran Out of Food in the Last Year: Not on file  Transportation Needs:   . Lack of Transportation (Medical): Not on file  . Lack of Transportation (Non-Medical): Not on file  Physical Activity:   . Days of Exercise per Week: Not on file  . Minutes of Exercise per Session: Not on file  Stress:   . Feeling of Stress : Not on file  Social Connections:   . Frequency of Communication with Friends and Family: Not on file  . Frequency of Social Gatherings with Friends and Family: Not on file  . Attends Religious Services: Not on file  . Active Member of Clubs or Organizations: Not on file  . Attends Archivist Meetings: Not on file  . Marital Status: Not on file  Intimate Partner Violence:   . Fear of Current or Ex-Partner: Not on file  . Emotionally Abused: Not on file  . Physically Abused: Not on file  . Sexually Abused: Not on file     Allergies  Allergen Reactions   . Lyrica [Pregabalin] Anxiety     Immunization History  Administered Date(s) Administered  . Influenza Whole 01/19/2019  . Influenza,inj,Quad PF,6+ Mos 03/03/2014, 01/17/2015, 02/28/2016, 02/10/2017, 12/31/2017  . Pneumococcal Conjugate-13 02/14/2019  . Pneumococcal Polysaccharide-23 03/03/2014  . Tdap 06/28/2014    Outpatient Medications Prior to Visit  Medication Sig Dispense Refill  . albuterol (VENTOLIN HFA) 108 (90 Base) MCG/ACT inhaler Inhale 2 puffs into the lungs every 6 (six) hours  as needed for wheezing or shortness of Kline. 6.7 g 11  . amLODipine (NORVASC) 10 MG tablet Take 1 tablet (10 mg total) by mouth daily. 30 tablet 6  . atorvastatin (LIPITOR) 40 MG tablet Take 1 tablet (40 mg total) by mouth daily. 90 tablet 3  . Blood Glucose Monitoring Suppl (TRUE METRIX METER) w/Device KIT Use as directed 1 kit 0  . glucose blood (TRUE METRIX BLOOD GLUCOSE TEST) test strip Use as instructed 100 each 12  . losartan-hydrochlorothiazide (HYZAAR) 100-12.5 MG tablet Take 1 tablet by mouth daily. 90 tablet 3  . metFORMIN (GLUCOPHAGE) 500 MG tablet Take 1 tablet (500 mg total) by mouth 2 (two) times daily with a meal. 60 tablet 6  . sertraline (ZOLOFT) 100 MG tablet Take 1.5 tablets (150 mg total) by mouth daily. 135 tablet 0  . tiotropium (SPIRIVA) 18 MCG inhalation capsule Place 1 capsule (18 mcg total) into inhaler and inhale daily. 30 capsule 6  . TRUEPLUS LANCETS 28G MISC Use as directed 100 each 2  . cyclobenzaprine (FLEXERIL) 5 MG tablet Take 1 tablet (5 mg total) by mouth 3 (three) times daily as needed for muscle spasms. (Patient not taking: Reported on 06/06/2019) 30 tablet 0  . naproxen (NAPROSYN) 500 MG tablet Take 1 tablet (500 mg total) by mouth 2 (two) times daily with a meal. (Patient not taking: Reported on 06/06/2019) 30 tablet 0  . traZODone (DESYREL) 100 MG tablet Take 1 tablet (100 mg total) by mouth at bedtime as needed for sleep. (Patient not taking: Reported on  06/06/2019) 90 tablet 0   No facility-administered medications prior to visit.    Review of Systems  Constitutional: Positive for malaise/fatigue. Negative for chills, diaphoresis, fever and weight loss.  HENT: Negative for congestion, ear pain and sore throat.   Eyes: Negative.   Respiratory: Positive for shortness of Kline. Negative for cough, hemoptysis, sputum production and wheezing.   Cardiovascular: Positive for chest pain. Negative for palpitations and leg swelling.  Gastrointestinal: Positive for abdominal pain, heartburn and nausea. Negative for blood in stool and constipation.  Genitourinary: Positive for frequency. Negative for hematuria.  Musculoskeletal: Positive for joint pain and myalgias.  Skin: Negative for itching and rash.  Neurological: Positive for dizziness, weakness and headaches. Negative for focal weakness.  Endo/Heme/Allergies: Does not bruise/bleed easily.  Psychiatric/Behavioral: Positive for depression. The patient is nervous/anxious.      Objective:   Vitals:   06/06/19 1340  BP: 122/78  Pulse: 83  Temp: 97.6 F (36.4 C)  TempSrc: Temporal  SpO2: 92%  Weight: (!) 341 lb 12.8 oz (155 kg)  Height: 5' 9"  (1.753 m)   92% on  RA BMI Readings from Last 3 Encounters:  06/06/19 50.48 kg/m  05/26/19 50.30 kg/m  05/04/19 49.23 kg/m   Wt Readings from Last 3 Encounters:  06/06/19 (!) 341 lb 12.8 oz (155 kg)  05/26/19 (!) 340 lb 9.6 oz (154.5 kg)  05/04/19 (!) 333 lb 6.4 oz (151.2 kg)    Physical Exam Vitals reviewed.  Constitutional:      General: He is not in acute distress.    Appearance: He is not ill-appearing.  HENT:     Head: Normocephalic and atraumatic.  Eyes:     General: No scleral icterus. Cardiovascular:     Rate and Rhythm: Normal rate and regular rhythm.     Heart sounds: No murmur.  Pulmonary:     Comments: Breathing comfortably on RA, CTAB Abdominal:  General: There is no distension.     Palpations: Abdomen is  soft.  Musculoskeletal:        General: No swelling or deformity.     Cervical back: Neck supple.  Lymphadenopathy:     Cervical: No cervical adenopathy.  Skin:    General: Skin is warm and dry.     Findings: No rash.  Neurological:     Mental Status: He is alert.     Coordination: Coordination normal.  Psychiatric:        Mood and Affect: Mood normal.        Behavior: Behavior normal.      CBC    Component Value Date/Time   WBC 8.5 10/08/2018 1407   WBC 11.1 (H) 11/16/2017 0042   RBC 4.99 10/08/2018 1407   RBC 4.83 11/16/2017 0042   HGB 14.0 10/08/2018 1407   HCT 44.0 10/08/2018 1407   PLT 320 10/08/2018 1407   MCV 88 10/08/2018 1407   MCH 28.1 10/08/2018 1407   MCH 31.1 11/16/2017 0042   MCHC 31.8 10/08/2018 1407   MCHC 34.4 11/16/2017 0042   RDW 14.3 10/08/2018 1407   LYMPHSABS 1.6 11/16/2017 0042   MONOABS 0.7 11/16/2017 0042   EOSABS 0.1 11/16/2017 0042   BASOSABS 0.0 11/16/2017 0042    CMP Latest Ref Rng & Units 10/08/2018 11/16/2017 10/06/2017  Glucose 65 - 99 mg/dL 109(H) 133(H) 148(H)  BUN 6 - 24 mg/dL 22 25(H) 29(H)  Creatinine 0.76 - 1.27 mg/dL 0.77 0.90 1.04  Sodium 134 - 144 mmol/L 137 138 138  Potassium 3.5 - 5.2 mmol/L 4.4 3.5 3.8  Chloride 96 - 106 mmol/L 100 101 100  CO2 20 - 29 mmol/L 21 - 27  Calcium 8.7 - 10.2 mg/dL 9.0 - 9.5  Total Protein 6.0 - 8.5 g/dL 6.9 - -  Total Bilirubin 0.0 - 1.2 mg/dL <0.2 - -  Alkaline Phos 39 - 117 IU/L 46 - -  AST 0 - 40 IU/L 18 - -  ALT 0 - 44 IU/L 16 - -     Chest Imaging- films reviewed: CXR, 2 view 11/15/2017-right hemidiaphragm eventration, prominent right heart, normal lungs.  Pulmonary Functions Testing Results: PFT Results Latest Ref Rng & Units 04/04/2019  FVC-Pre L 3.05  FVC-Predicted Pre % 61  FVC-Post L 3.19  FVC-Predicted Post % 64  Pre FEV1/FVC % % 73  Post FEV1/FCV % % 75  FEV1-Pre L 2.24  FEV1-Predicted Pre % 57  FEV1-Post L 2.38  DLCO UNC% % 76  DLCO COR %Predicted % 109  TLC L 5.38   TLC % Predicted % 79  RV % Predicted % 123   2020- No significant obstruction, Mild restriction, mildly reduced diffusion capacity uncorrected for hemoglobin.  Flow volume loop supports restriction.   PSG 10/27/2017-OSA, titrated to BiPAP 19/15 cm H2O when wearing full facemask  Echocardiogram 12/2016: - Left ventricle: The cavity size was normal. Wall thickness wasincreased in a pattern of mild LVH. Systolic function was normal. The estimated ejection fraction was in the range of 55% to 60%.Wall motion was normal; there were no regional wall motion abnormalities. Doppler parameters are consistent with abnormalleft ventricular relaxation (grade 1 diastolic dysfunction). - Mitral valve: Calcified annulus. - Left atrium: The atrium was mildly dilated. - Atrial septum: No defect or patent foramen ovale was identified.  Echocardiogram 02/04/2019: LVEF 60 to 65%, mild LVH, diastolic dysfunction.  Normal LA, RV, RA.  Normal valves.  Exercise tolerance test 12/2016:  The patient  walked for 6:05 of a Bruce protocol. Peak HR of 151 which is 86% predicted maximal HR .  There were no ST or T wave changes to suggest ischemia  BP response was normal  Negative GXT       Assessment & Plan:     ICD-10-CM   1. Shortness of Kline  R06.02       Shortness of Kline likely due to mild obstructive lung disease.  PFTs do not demonstrate obstruction, but had previously had improvement of symptoms with use of Spiriva. Restriction likely due to body habitus. -Congratulated him on his success with quitting vaping and continue to avoid tobacco -Continue Spiriva once daily. -Albuterol as needed -Recommend Covid vaccine when it is available to him.  Up-to-date on seasonal flu and pneumococcal vaccines.  Continue Covid precautions-social distancing, mask wearing, handwashing.  HFpEF -Recommend optimizing sleep apnea, hypertension, weight loss  Morbid obesity, placing him at risk for OHS and  restrictive lung disease. -Recommend a long-term goal of weight loss-agree with his plans for changing his diet and increasing his physical activity.  OSA -Continue with current CPAP and follow-up with Dr. Ander Slade.   RTC in 1 year.   Current Outpatient Medications:  .  albuterol (VENTOLIN HFA) 108 (90 Base) MCG/ACT inhaler, Inhale 2 puffs into the lungs every 6 (six) hours as needed for wheezing or shortness of Kline., Disp: 6.7 g, Rfl: 11 .  amLODipine (NORVASC) 10 MG tablet, Take 1 tablet (10 mg total) by mouth daily., Disp: 30 tablet, Rfl: 6 .  atorvastatin (LIPITOR) 40 MG tablet, Take 1 tablet (40 mg total) by mouth daily., Disp: 90 tablet, Rfl: 3 .  Blood Glucose Monitoring Suppl (TRUE METRIX METER) w/Device KIT, Use as directed, Disp: 1 kit, Rfl: 0 .  glucose blood (TRUE METRIX BLOOD GLUCOSE TEST) test strip, Use as instructed, Disp: 100 each, Rfl: 12 .  losartan-hydrochlorothiazide (HYZAAR) 100-12.5 MG tablet, Take 1 tablet by mouth daily., Disp: 90 tablet, Rfl: 3 .  metFORMIN (GLUCOPHAGE) 500 MG tablet, Take 1 tablet (500 mg total) by mouth 2 (two) times daily with a meal., Disp: 60 tablet, Rfl: 6 .  sertraline (ZOLOFT) 100 MG tablet, Take 1.5 tablets (150 mg total) by mouth daily., Disp: 135 tablet, Rfl: 0 .  tiotropium (SPIRIVA) 18 MCG inhalation capsule, Place 1 capsule (18 mcg total) into inhaler and inhale daily., Disp: 30 capsule, Rfl: 6 .  TRUEPLUS LANCETS 28G MISC, Use as directed, Disp: 100 each, Rfl: 2 .  cyclobenzaprine (FLEXERIL) 5 MG tablet, Take 1 tablet (5 mg total) by mouth 3 (three) times daily as needed for muscle spasms. (Patient not taking: Reported on 06/06/2019), Disp: 30 tablet, Rfl: 0 .  naproxen (NAPROSYN) 500 MG tablet, Take 1 tablet (500 mg total) by mouth 2 (two) times daily with a meal. (Patient not taking: Reported on 06/06/2019), Disp: 30 tablet, Rfl: 0 .  traZODone (DESYREL) 100 MG tablet, Take 1 tablet (100 mg total) by mouth at bedtime as needed for  sleep. (Patient not taking: Reported on 06/06/2019), Disp: 90 tablet, Rfl: 0   Julian Hy, DO [facility] 06/06/2019 6:43 PM

## 2019-06-21 MED FILL — SERTRALINE HCL 100 MG TAB: 100 | 30 days supply | Qty: 45 | Fill #0

## 2019-06-21 MED FILL — ?METFORMIN HCL 500MG TABLET: 500 | 30 days supply | Qty: 60 | Fill #2

## 2019-06-21 MED FILL — LOSARTAN-HCTZ 100-12.5 MG T: 100-12.5 | 30 days supply | Qty: 30 | Fill #3

## 2019-06-21 MED FILL — AMLODIPINE BESYLATE 10 MG T: 10 | 30 days supply | Qty: 30 | Fill #3

## 2019-06-29 ENCOUNTER — Ambulatory Visit: Payer: Self-pay | Admitting: Registered"

## 2019-06-30 ENCOUNTER — Other Ambulatory Visit: Payer: Self-pay

## 2019-06-30 ENCOUNTER — Ambulatory Visit (INDEPENDENT_AMBULATORY_CARE_PROVIDER_SITE_OTHER): Payer: Self-pay | Admitting: Psychiatry

## 2019-06-30 ENCOUNTER — Other Ambulatory Visit (HOSPITAL_COMMUNITY): Payer: Self-pay | Admitting: Psychiatry

## 2019-06-30 DIAGNOSIS — F411 Generalized anxiety disorder: Secondary | ICD-10-CM

## 2019-06-30 DIAGNOSIS — F320 Major depressive disorder, single episode, mild: Secondary | ICD-10-CM

## 2019-06-30 DIAGNOSIS — F32A Depression, unspecified: Secondary | ICD-10-CM

## 2019-06-30 MED ORDER — SERTRALINE HCL 100 MG PO TABS: 150.0000 mg | ORAL_TABLET | Freq: Every day | ORAL | 5 refills | Status: DC

## 2019-06-30 NOTE — Progress Notes (Signed)
[facility] MD/PA/NP OP Progress Note  06/30/2019 10:39 AM Juan Kline  MRN:  [id number] Interview was conducted by phone and I verified that I was speaking with the correct person using two identifiers. I discussed the limitations of evaluation and management by telemedicine and  the availability of in person appointments. Patient expressed understanding and agreed to proceed.  Chief Complaint: "I am doing well".  HPI: 49 yo single male with approximately 2 year hx of increased anxiety, some situational depression and occasional spells/episodes of shaking, tunnel vision, SOB, dizziness, lightheadedness. Patient has been seen last year by Dr. Charlotte Crumb and has had brain CT and MRI as well as EEG done - all basically normal. Patient's depressed mood was originallytriggered by ultimatum given to him by mother/step-father last year that he either finds a job or will have to move out of their home. He works now at [employer] part time but financial stress is still present. He has considered applying for disabilityas he felt physically challenging to do his job. He has never felt suicidal and has no hx of inpatient psychiatric admissions. He denies having history of mania or psychosis. He has been taking sertraline since June of 2019 -we increased the dosefew monthsago and he iscurrentlyat 125m. He tolerates it well and feels that his anxiety has subsided. Prior to sertraline he has tried escitalopram - does not recall if it was helpful. He reports having good appetiteand adequate sleep with trazodone (he has OSA and uses CPAP).GArleighadmits to a hx of alcohol use disorderbutquit on his own 4 years ago. He denies abusing street drugs now or in the past.  He reports feeling stable on sertraline and has not used trazodone in over a month (sleeps well).  Visit Diagnosis:    ICD-10-CM   1. GAD (generalized anxiety disorder)  F41.1   2. Mild depression (HCC)  F32.0 sertraline (ZOLOFT) 100 MG tablet    Past  Psychiatric History: Please see intake H&P.  Past Medical History:  Past Medical History:  Diagnosis Date  . [place] foot [place] ([place] 03/02/2014   medial border first metatarsal left foot  . Diabetes ([facility]   . Diabetic foot infection ([facility]   . Hyperlipidemia   . Hypertension Dx 2009  . OSA (obstructive sleep apnea)    CPAP  . Osteomyelitis left first metatarsal 03/02/2014  . Sleep apnea   . Venous stasis ulcer of left lower extremity ([facility]    /[name]11/19/2015    Past Surgical History:  Procedure Laterality Date  . NO PAST SURGERIES      Family Psychiatric History: None.  Family History:  Family History  Problem Relation Age of Onset  . Hypertension Mother   . Hyperlipidemia Mother   . Diabetes Mother   . Kidney disease Mother   . Hypertension Father   . Hyperlipidemia Father   . Diabetes Brother   . Hypertension Brother   . Hyperlipidemia Brother   . Heart disease Maternal Grandmother   . COPD Maternal Grandmother   . Diabetes Maternal Grandfather   . Heart attack Paternal Grandmother   . Heart disease Paternal Grandfather     Social History:  Social History   Socioeconomic History  . Marital status: Single    Spouse name: Not on file  . Number of children: 0  . Years of education: Not on file  . Highest education level: Not on file  Occupational History  . Not on file  Tobacco Use  . Smoking status:  Current Some Day Smoker    Packs/day: 1.50    Years: 26.00    Pack years: 39.00    Types: E-cigarettes, Cigarettes    Start date: 1989    Last attempt to quit: 02/16/2014    Years since quitting: 5.3  . Smokeless tobacco: Never Used  Substance and Sexual Activity  . Alcohol use: Yes    Comment: 03/02/2014 "I rarely drink"  . Drug use: No  . Sexual activity: Not Currently  Other Topics Concern  . Not on file  Social History Narrative   Pt lives in 1 story home with his mother and step father   9th grade education   Last occupation was  with [employer] in 2009      Unemployed at this time- thinking of filing for disability      Right handed      Social Determinants of Health   Financial Resource Strain:   . Difficulty of Paying Living Expenses:   Food Insecurity:   . Worried About Charity fundraiser in the Last Year:   . Arboriculturist in the Last Year:   Transportation Needs:   . Film/video editor (Medical):   Marland Kitchen Lack of Transportation (Non-Medical):   Physical Activity:   . Days of Exercise per Week:   . Minutes of Exercise per Session:   Stress:   . Feeling of Stress :   Social Connections:   . Frequency of Communication with Friends and Family:   . Frequency of Social Gatherings with Friends and Family:   . Attends Religious Services:   . Active Member of Clubs or Organizations:   . Attends Archivist Meetings:   Marland Kitchen Marital Status:     Allergies:  Allergies  Allergen Reactions  . Lyrica [Pregabalin] Anxiety    Metabolic Disorder Labs: Lab Results  Component Value Date   HGBA1C 6.6 (H) 03/04/2019   MPG 134 (H) 03/03/2014   No results found for: PROLACTIN Lab Results  Component Value Date   CHOL 200 (H) 03/04/2019   TRIG 255 (H) 03/04/2019   HDL 46 03/04/2019   CHOLHDL 4.3 03/04/2019   VLDL 65 (H) 02/28/2016   LDLCALC 110 (H) 03/04/2019   LDLCALC 163 (H) 12/10/2016   Lab Results  Component Value Date   TSH 4.170 08/13/2017   TSH 2.310 09/26/2016    Therapeutic Level Labs: No results found for: LITHIUM No results found for: VALPROATE No components found for:  CBMZ  Current Medications: Current Outpatient Medications  Medication Sig Dispense Refill  . albuterol (VENTOLIN HFA) 108 (90 Base) MCG/ACT inhaler Inhale 2 puffs into the lungs every 6 (six) hours as needed for wheezing or shortness of breath. 6.7 g 11  . amLODipine (NORVASC) 10 MG tablet Take 1 tablet (10 mg total) by mouth daily. 30 tablet 6  . atorvastatin (LIPITOR) 40 MG tablet Take 1 tablet (40 mg total)  by mouth daily. 90 tablet 3  . Blood Glucose Monitoring Suppl (TRUE METRIX METER) w/Device KIT Use as directed 1 kit 0  . cyclobenzaprine (FLEXERIL) 5 MG tablet Take 1 tablet (5 mg total) by mouth 3 (three) times daily as needed for muscle spasms. (Patient not taking: Reported on 06/06/2019) 30 tablet 0  . glucose blood (TRUE METRIX BLOOD GLUCOSE TEST) test strip Use as instructed 100 each 12  . losartan-hydrochlorothiazide (HYZAAR) 100-12.5 MG tablet Take 1 tablet by mouth daily. 90 tablet 3  . metFORMIN (GLUCOPHAGE) 500 MG tablet Take  1 tablet (500 mg total) by mouth 2 (two) times daily with a meal. 60 tablet 6  . naproxen (NAPROSYN) 500 MG tablet Take 1 tablet (500 mg total) by mouth 2 (two) times daily with a meal. (Patient not taking: Reported on 06/06/2019) 30 tablet 0  . sertraline (ZOLOFT) 100 MG tablet Take 1.5 tablets (150 mg total) by mouth daily. 45 tablet 5  . tiotropium (SPIRIVA) 18 MCG inhalation capsule Place 1 capsule (18 mcg total) into inhaler and inhale daily. 30 capsule 6  . traZODone (DESYREL) 100 MG tablet Take 1 tablet (100 mg total) by mouth at bedtime as needed for sleep. (Patient not taking: Reported on 06/06/2019) 90 tablet 0  . TRUEPLUS LANCETS 28G MISC Use as directed 100 each 2   No current facility-administered medications for this visit.    Psychiatric Specialty Exam: Review of Systems  All other systems reviewed and are negative.   There were no vitals taken for this visit.There is no height or weight on file to calculate BMI.  General Appearance: NA  Eye Contact:  NA  Speech:  Clear and Coherent and Normal Rate  Volume:  Normal  Mood:  Euthymic  Affect:  NA  Thought Process:  Goal Directed and Linear  Orientation:  Full (Time, Place, and Person)  Thought Content: Logical   Suicidal Thoughts:  No  Homicidal Thoughts:  No  Memory:  Immediate;   Good Recent;   Good Remote;   Good  Judgement:  Good  Insight:  Fair  Psychomotor Activity:  NA   Concentration:  Concentration: Good  Recall:  Good  Fund of Knowledge: Good  Language: Good  Akathisia:  Negative  Handed:  Right  AIMS (if indicated): not done  Assets:  Communication Skills Desire for Improvement Housing Resilience  ADL's:  Intact  Cognition: WNL  Sleep:  Good   Screenings: GAD-7     Office Visit from 11/26/2018 in [facility] Office Visit from 07/01/2018 in [facility] Office Visit from 05/21/2018 in [facility] Office Visit from 04/13/2018 in [facility] Office Visit from 04/01/2018 in [facility]  Total GAD-7 Score  0  0  0  3  11    PHQ2-9     Nutrition [facility]/17/2019 [facility] Most recent reading at 05/17/2019  2:08 PM Office Visit from 05/17/2019 in [facility] Most recent reading at 05/17/2019  8:40 AM Office Visit from 11/26/2018 in [facility] Most recent reading at 11/26/2018 11:31 AM Office Visit from 07/01/2018 in [facility] Most recent reading at 07/01/2018  3:58 PM Office Visit from 05/21/2018 in [facility] Most recent reading at 05/21/2018  8:48 AM  PHQ-2 Total Score  0  0  0  0  0  PHQ-9 Total Score  -  0  -  2  1       Assessment and Plan: 49 yo single male with approximately 2 year hx of increased anxiety, some situational depression and occasional spells/episodes of shaking, tunnel vision, SOB, dizziness, lightheadedness. Patient has been seen last year by Dr. Charlotte Crumb and has had brain CT and MRI as well as EEG done - all basically normal. Patient's depressed mood was originallytriggered by ultimatum given to him by mother/step-father last year that he either  finds a job or will have to move out of their home. He works now at [employer] part time but  financial stress is still present. He has considered applying for disabilityas he felt physically challenging to do his job. He has never felt suicidal and has no hx of inpatient psychiatric admissions. He denies having history of mania or psychosis. He has been taking sertraline since June of 2019 -we increased the dosefew monthsago and he iscurrentlyat 154m. He tolerates it well and feels that his anxiety has subsided. Prior to sertraline he has tried escitalopram - does not recall if it was helpful. He reports having good appetiteand adequate sleep with trazodone (he has OSA and uses CPAP).GJiaireadmits to a hx of alcohol use disorderbutquit on his own 4 years ago. He denies abusing street drugs now or in the past.  He reports feeling stable on sertraline and has not used trazodone in over a month (sleeps well).  Dx: Generalized anxiety disorder;Depressive disorder unspecified, in remission; Alcohol use disorder in sustained remission  Plan:Continuesertraline 150 mg daily; he has two refills on trazodone 100 mg prn middle insomnia should he need it. Next appointment in 3 months.The plan was discussed with patient who had an opportunity to ask questions and these were all answered. I spend288mutes inphone consultationwiththe patient.    OlStephanie AcreMD 06/30/2019, 10:39 AM

## 2019-07-06 ENCOUNTER — Other Ambulatory Visit: Payer: Self-pay

## 2019-07-06 ENCOUNTER — Ambulatory Visit: Payer: Self-pay | Attending: Internal Medicine

## 2019-07-07 ENCOUNTER — Encounter: Payer: Self-pay | Admitting: Internal Medicine

## 2019-07-07 ENCOUNTER — Ambulatory Visit: Payer: Self-pay | Attending: Internal Medicine | Admitting: Internal Medicine

## 2019-07-07 VITALS — BP 116/77 | HR 74 | Temp 98.2°F | Resp 16 | Wt 340.8 lb

## 2019-07-07 DIAGNOSIS — E1142 Type 2 diabetes mellitus with diabetic polyneuropathy: Secondary | ICD-10-CM

## 2019-07-07 DIAGNOSIS — I10 Essential (primary) hypertension: Secondary | ICD-10-CM

## 2019-07-07 DIAGNOSIS — F411 Generalized anxiety disorder: Secondary | ICD-10-CM

## 2019-07-07 DIAGNOSIS — G4733 Obstructive sleep apnea (adult) (pediatric): Secondary | ICD-10-CM

## 2019-07-07 LAB — POCT GLYCOSYLATED HEMOGLOBIN (HGB A1C): HbA1c, POC (controlled diabetic range): 6.7 % (ref 0.0–7.0)

## 2019-07-07 LAB — GLUCOSE, POCT (MANUAL RESULT ENTRY): POC Glucose: 176 mg/dl — AB (ref 70–99)

## 2019-07-07 NOTE — Progress Notes (Signed)
Patient ID: Juan Kline, male    DOB: October 19, 1970  MRN: [id number]  CC: Diabetes and Hypertension   Subjective: Juan Kline is a 49 y.o. male who presents for chronic disease management. His concerns today include:  HTN, HL,DM type 2with neuropathy,anxiety, chronic fatigue, severe OSA on BiPAP, vapes, mix COPD, obesity  DM/Obesity:  Not checking blood sugars much but reports that when he does, his numbers are usually in the low 100s. A1C 6.7 today Saw nutritionist. Working on trying to make better food choices. Eating late at nights sometimes due to work schedule.  Snacks a lot to make it to next meal.  HTN: has device.  Not checking regularly Limiting salt in the foods. Denies any chest pains or lower extremity swelling.  OSA: He saw pulmonary recently in follow-up.  Reports compliance with BiPAP but admits that 1-2 nights a wk he may fall asleep before having a chance to put it on.  He is tolerating the BiPAP well.    Dep/Anxiety: Followed by behavioral health.  Sleeping better.  Not taking Trazodone anymore.  Feels he is doing okay on Zoloft. Patient Active Problem List   Diagnosis Date Noted  . 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 Positive for macroalbuminuria 03/06/2019  . Vapes nicotine containing substance 03/03/2019  . Class 3 severe obesity due to excess calories without serious comorbidity with body mass index (BMI) of 45.0 to 49.9 in adult [facility]) 03/03/2019  . Hyperlipidemia associated with type 2 diabetes mellitus ([place]) 03/03/2019  . Type 2 diabetes mellitus with diabetic polyneuropathy, without long-term current use of insulin ([place]) 04/13/2018  . Mild depression ([place]) 04/01/2018  . OSA (obstructive sleep apnea) 10/26/2017  . Anxiety disorder 10/26/2017  . Peripheral neuropathy 12/12/2016  . GAD (generalized anxiety disorder) 05/11/2015  . Former smoker 05/11/2015  . Other hyperlipidemia 05/22/2014  . Vitamin D insufficiency 05/22/2014  . Numbness and tingling of  foot 05/19/2014  . Venous stasis dermatitis of both lower extremities 05/19/2014  . history of Hypertension 03/02/2014  . Morbid obesity ([place]) 03/02/2014     Current Outpatient Medications on File Prior to Visit  Medication Sig Dispense Refill  . albuterol (VENTOLIN HFA) 108 (90 Base) MCG/ACT inhaler Inhale 2 puffs into the lungs every 6 (six) hours as needed for wheezing or shortness of breath. 6.7 g 11  . amLODipine (NORVASC) 10 MG tablet Take 1 tablet (10 mg total) by mouth daily. 30 tablet 6  . atorvastatin (LIPITOR) 40 MG tablet Take 1 tablet (40 mg total) by mouth daily. 90 tablet 3  . Blood Glucose Monitoring Suppl (TRUE METRIX METER) w/Device KIT Use as directed 1 kit 0  . glucose blood (TRUE METRIX BLOOD GLUCOSE TEST) test strip Use as instructed 100 each 12  . losartan-hydrochlorothiazide (HYZAAR) 100-12.5 MG tablet Take 1 tablet by mouth daily. 90 tablet 3  . metFORMIN (GLUCOPHAGE) 500 MG tablet Take 1 tablet (500 mg total) by mouth 2 (two) times daily with a meal. 60 tablet 6  . sertraline (ZOLOFT) 100 MG tablet Take 1.5 tablets (150 mg total) by mouth daily. 45 tablet 5  . tiotropium (SPIRIVA) 18 MCG inhalation capsule Place 1 capsule (18 mcg total) into inhaler and inhale daily. 30 capsule 6  . traZODone (DESYREL) 100 MG tablet Take 1 tablet (100 mg total) by mouth at bedtime as needed for sleep. (Patient not taking: Reported on 06/06/2019) 90 tablet 0  . TRUEPLUS LANCETS 28G MISC Use as directed 100 each 2  No current facility-administered medications on file prior to visit.    Allergies  Allergen Reactions  . Lyrica [Pregabalin] Anxiety    Social History   Socioeconomic History  . Marital status: Single    Spouse name: Not on file  . Number of children: 0  . Years of education: Not on file  . Highest education level: Not on file  Occupational History  . Not on file  Tobacco Use  . Smoking status: Current Some Day Smoker    Packs/day: 1.50    Years: 26.00     Pack years: 39.00    Types: E-cigarettes, Cigarettes    Start date: 1989    Last attempt to quit: 02/16/2014    Years since quitting: 5.3  . Smokeless tobacco: Never Used  Substance and Sexual Activity  . Alcohol use: Yes    Comment: 03/02/2014 "I rarely drink"  . Drug use: No  . Sexual activity: Not Currently  Other Topics Concern  . Not on file  Social History Narrative   Pt lives in 1 story home with his mother and step father   9th grade education   Last occupation was with [employer] in 2009      Unemployed at this time- thinking of filing for disability      Right handed      Social Determinants of Health   Financial Resource Strain:   . Difficulty of Paying Living Expenses:   Food Insecurity:   . Worried About Charity fundraiser in the Last Year:   . Arboriculturist in the Last Year:   Transportation Needs:   . Film/video editor (Medical):   Marland Kitchen Lack of Transportation (Non-Medical):   Physical Activity:   . Days of Exercise per Week:   . Minutes of Exercise per Session:   Stress:   . Feeling of Stress :   Social Connections:   . Frequency of Communication with Friends and Family:   . Frequency of Social Gatherings with Friends and Family:   . Attends Religious Services:   . Active Member of Clubs or Organizations:   . Attends Archivist Meetings:   Marland Kitchen Marital Status:   Intimate Partner Violence:   . Fear of Current or Ex-Partner:   . Emotionally Abused:   Marland Kitchen Physically Abused:   . Sexually Abused:     Family History  Problem Relation Age of Onset  . Hypertension Mother   . Hyperlipidemia Mother   . Diabetes Mother   . Kidney disease Mother   . Hypertension Father   . Hyperlipidemia Father   . Diabetes Brother   . Hypertension Brother   . Hyperlipidemia Brother   . Heart disease Maternal Grandmother   . COPD Maternal Grandmother   . Diabetes Maternal Grandfather   . Heart attack Paternal Grandmother   . Heart disease Paternal  Grandfather     Past Surgical History:  Procedure Laterality Date  . NO PAST SURGERIES      ROS: Review of Systems Negative except as stated above  PHYSICAL EXAM: BP 116/77   Pulse 74   Temp 98.2 F (36.8 C)   Resp 16   Wt (!) 340 lb 12.8 oz (154.6 kg)   SpO2 95%   BMI 50.33 kg/m   Wt Readings from Last 3 Encounters:  07/07/19 (!) 340 lb 12.8 oz (154.6 kg)  06/06/19 (!) 341 lb 12.8 oz (155 kg)  05/26/19 (!) 340 lb 9.6 oz (154.5 kg)  Physical Exam General appearance - alert, well appearing, and in no distress Mental status - normal mood, behavior, speech, dress, motor activity, and thought processes Neck - supple, no significant adenopathy Chest - clear to auscultation, no wheezes, rales or rhonchi, symmetric air entry Heart - normal rate, regular rhythm, normal S1, S2, no murmurs, rubs, clicks or gallops Extremities -patient with moderate varicose veins in the lower extremities with some venous stasis hyperpigmentation changes on the lower shin. Diabetic Foot Exam - Simple   Simple Foot Form Visual Inspection See comments: Yes Sensation Testing See comments: Yes Pulse Check Posterior Tibialis and Dorsalis pulse intact bilaterally: Yes Comments Patient is flat-footed.  Toenails are discolored and some of them overgrown.     CMP Latest Ref Rng & Units 10/08/2018 11/16/2017 10/06/2017  Glucose 65 - 99 mg/dL 109(H) 133(H) 148(H)  BUN 6 - 24 mg/dL 22 25(H) 29(H)  Creatinine 0.76 - 1.27 mg/dL 0.77 0.90 1.04  Sodium 134 - 144 mmol/L 137 138 138  Potassium 3.5 - 5.2 mmol/L 4.4 3.5 3.8  Chloride 96 - 106 mmol/L 100 101 100  CO2 20 - 29 mmol/L 21 - 27  Calcium 8.7 - 10.2 mg/dL 9.0 - 9.5  Total Protein 6.0 - 8.5 g/dL 6.9 - -  Total Bilirubin 0.0 - 1.2 mg/dL <0.2 - -  Alkaline Phos 39 - 117 IU/L 46 - -  AST 0 - 40 IU/L 18 - -  ALT 0 - 44 IU/L 16 - -   Lipid Panel     Component Value Date/Time   CHOL 200 (H) 03/04/2019 1107   TRIG 255 (H) 03/04/2019 1107   HDL 46  03/04/2019 1107   CHOLHDL 4.3 03/04/2019 1107   CHOLHDL 5.6 (H) 02/28/2016 1244   VLDL 65 (H) 02/28/2016 1244   LDLCALC 110 (H) 03/04/2019 1107    CBC    Component Value Date/Time   WBC 8.5 10/08/2018 1407   WBC 11.1 (H) 11/16/2017 0042   RBC 4.99 10/08/2018 1407   RBC 4.83 11/16/2017 0042   HGB 14.0 10/08/2018 1407   HCT 44.0 10/08/2018 1407   PLT 320 10/08/2018 1407   MCV 88 10/08/2018 1407   MCH 28.1 10/08/2018 1407   MCH 31.1 11/16/2017 0042   MCHC 31.8 10/08/2018 1407   MCHC 34.4 11/16/2017 0042   RDW 14.3 10/08/2018 1407   LYMPHSABS 1.6 11/16/2017 0042   MONOABS 0.7 11/16/2017 0042   EOSABS 0.1 11/16/2017 0042   BASOSABS 0.0 11/16/2017 0042   Results for orders placed or performed in visit on 07/07/19  POCT glucose (manual entry)  Result Value Ref Range   POC Glucose 176 (A) 70 - 99 mg/dl  POCT glycosylated hemoglobin (Hb A1C)  Result Value Ref Range   Hemoglobin A1C     HbA1c POC (<> result, manual entry)     HbA1c, POC (prediabetic range)     HbA1c, POC (controlled diabetic range) 6.7 0.0 - 7.0 %    ASSESSMENT AND PLAN: 1. Type 2 diabetes mellitus with diabetic polyneuropathy, without long-term current use of insulin (HCC) A1c at goal.  Continue Metformin.  Encouraged him in healthy eating habits.  We discussed having him take his dinner with him to work to eat on his break so that he is not eating so late at nights when he gets home.  Encouraged him to move more. - POCT glucose (manual entry) - POCT glycosylated hemoglobin (Hb A1C)  2. Essential hypertension At goal.  Continue current medications and low-salt  diet.  3. Morbid obesity ([place]) See #1 above.  4. OSA (obstructive sleep apnea) Encourage him to remain compliant with BiPAP use  5. GAD (generalized anxiety disorder) Plugged into mental health services.  Doing well on Zoloft.    Patient was given the opportunity to ask questions.  Patient verbalized understanding of the plan and was able to  repeat key elements of the plan.   Orders Placed This Encounter  Procedures  . POCT glucose (manual entry)  . POCT glycosylated hemoglobin (Hb A1C)     Requested Prescriptions    No prescriptions requested or ordered in this encounter    Return in about 4 months (around 11/06/2019).  Karle Plumber, MD, FACP

## 2019-07-11 ENCOUNTER — Ambulatory Visit: Payer: Self-pay | Admitting: Internal Medicine

## 2019-07-15 ENCOUNTER — Ambulatory Visit: Payer: Self-pay | Attending: Internal Medicine

## 2019-07-15 DIAGNOSIS — Z23 Encounter for immunization: Secondary | ICD-10-CM

## 2019-07-15 NOTE — Progress Notes (Signed)
   Covid-19 Vaccination Clinic  Name:  Juan Kline    MRN: [id number] DOB: May 11, 1970  07/15/2019  Juan Kline was observed post Covid-19 immunization for 15 minutes without incident. He was provided with Vaccine Information Sheet and instruction to access the V-Safe system.   Juan Kline was instructed to call 911 with any severe reactions post vaccine: Marland Kitchen Difficulty breathing  . Swelling of face and throat  . A fast heartbeat  . A bad rash all over body  . Dizziness and weakness   Immunizations Administered    Name Date Dose VIS Date Route   Pfizer COVID-19 Vaccine 07/15/2019 12:51 PM 0.3 mL 03/25/2019 Intramuscular   Manufacturer: [place]   Lot: [id number]   [place]: [id number]

## 2019-07-18 MED FILL — AMLODIPINE BESYLATE 10 MG T: 10 | 30 days supply | Qty: 30 | Fill #4

## 2019-07-18 MED FILL — ?ATORVASTATIN 40MG TABLET: 40 | 30 days supply | Qty: 30 | Fill #4

## 2019-07-18 MED FILL — ?METFORMIN HCL 500MG TABLET: 500 | 30 days supply | Qty: 60 | Fill #3

## 2019-07-18 MED FILL — LOSARTAN-HCTZ 100-12.5 MG T: 100-12.5 | 30 days supply | Qty: 30 | Fill #4

## 2019-08-01 ENCOUNTER — Telehealth: Payer: Self-pay | Admitting: Internal Medicine

## 2019-08-04 ENCOUNTER — Other Ambulatory Visit: Payer: Self-pay

## 2019-08-04 ENCOUNTER — Ambulatory Visit: Payer: Self-pay | Attending: Critical Care Medicine | Admitting: Critical Care Medicine

## 2019-08-04 ENCOUNTER — Encounter: Payer: Self-pay | Admitting: Critical Care Medicine

## 2019-08-04 DIAGNOSIS — U071 COVID-19: Secondary | ICD-10-CM

## 2019-08-04 MED ORDER — DIPHENOXYLATE-ATROPINE 2.5-0.025 MG PO TABS: 1.0000 | ORAL_TABLET | Freq: Four times a day (QID) | ORAL | 0 refills | Status: DC | PRN

## 2019-08-04 MED FILL — SERTRALINE HCL 100 MG TAB: 100 | 30 days supply | Qty: 45 | Fill #1

## 2019-08-04 NOTE — Progress Notes (Signed)
Subjective:    Patient ID: Juan Kline, male    DOB: Sep 05, 1970, 49 y.o.   MRN: [id number] Virtual Visit via Video Note  I connected with@ on 08/04/19 at@ by a video enabled telemedicine application and verified that I am speaking with the correct person using two identifiers.   Consent:  I discussed the limitations, risks, security and privacy concerns of performing an evaluation and management service by video visit and the availability of in person appointments. I also discussed with the patient that there may be a patient responsible charge related to this service. The patient expressed understanding and agreed to proceed.  Location of patient:  Location of provider:  Persons participating in the televisit with the patient.       History of Present Illness: 49 y.o.M  HTN COPD OSA T2DM This is a telemedicine video visit The patient gives a history of having a Pfizer vaccine his first dose on April 2 and he states within 2 days he began having diarrhea fatigue headaches loss of taste and smell nausea muscle aches and severe fatigue The patient has another dose of Covid vaccine due on April 23  The patient has been staying out of work at this time.  He works at [employer] and is exposed to various customers.  He has been keeping up with his fluids.  He denies chest pain.  He states his dyspnea is stable at this time.  He denies a cough.  His diarrhea though is quite excessive.    Past Medical History:  Diagnosis Date  . Chronic foot ulcer ([place]) admitted 03/02/2014   medial border first metatarsal left foot  . Diabetes ([place])   . Diabetic foot infection ([place])   . Hyperlipidemia   . Hypertension Dx 2009  . OSA (obstructive sleep apnea)    CPAP  . Osteomyelitis left first metatarsal 03/02/2014  . Sleep apnea   . Venous stasis ulcer of left lower extremity ([place])    [name] 03/02/2014     Family History  Problem Relation Age of Onset  . Hypertension Mother   .  Hyperlipidemia Mother   . Diabetes Mother   . Kidney disease Mother   . Hypertension Father   . Hyperlipidemia Father   . Diabetes Brother   . Hypertension Brother   . Hyperlipidemia Brother   . Heart disease Maternal Grandmother   . COPD Maternal Grandmother   . Diabetes Maternal Grandfather   . Heart attack Paternal Grandmother   . Heart disease Paternal Grandfather      Social History   Socioeconomic History  . Marital status: Single    Spouse name: Not on file  . Number of children: 0  . Years of education: Not on file  . Highest education level: Not on file  Occupational History  . Not on file  Tobacco Use  . Smoking status: Current Some Day Smoker    Packs/day: 1.50    Years: 26.00    Pack years: 39.00    Types: E-cigarettes, Cigarettes    Start date: 1989    Last attempt to quit: 02/16/2014    Years since quitting: 5.4  . Smokeless tobacco: Never Used  Substance and Sexual Activity  . Alcohol use: Yes    Comment: 03/02/2014 "I rarely drink"  . Drug use: No  . Sexual activity: Not Currently  Other Topics Concern  . Not on file  Social History Narrative   Pt lives in 1 story home with his  mother and step father   9th grade education   Last occupation was with [employer] in 2009      Unemployed at this time- thinking of filing for disability      Right handed      Social Determinants of Health   Financial Resource Strain:   . Difficulty of Paying Living Expenses:   Food Insecurity:   . Worried About Charity fundraiser in the Last Year:   . Arboriculturist in the Last Year:   Transportation Needs:   . Film/video editor (Medical):   Marland Kitchen Lack of Transportation (Non-Medical):   Physical Activity:   . Days of Exercise per Week:   . Minutes of Exercise per Session:   Stress:   . Feeling of Stress :   Social Connections:   . Frequency of Communication with Friends and Family:   . Frequency of Social Gatherings with Friends and Family:   . Attends  Religious Services:   . Active Member of Clubs or Organizations:   . Attends Archivist Meetings:   Marland Kitchen Marital Status:   Intimate Partner Violence:   . Fear of Current or Ex-Partner:   . Emotionally Abused:   Marland Kitchen Physically Abused:   . Sexually Abused:      Allergies  Allergen Reactions  . Lyrica [Pregabalin] Anxiety     Outpatient Medications Prior to Visit  Medication Sig Dispense Refill  . albuterol (VENTOLIN HFA) 108 (90 Base) MCG/ACT inhaler Inhale 2 puffs into the lungs every 6 (six) hours as needed for wheezing or shortness of breath. 6.7 g 11  . amLODipine (NORVASC) 10 MG tablet Take 1 tablet (10 mg total) by mouth daily. 30 tablet 6  . atorvastatin (LIPITOR) 40 MG tablet Take 1 tablet (40 mg total) by mouth daily. 90 tablet 3  . Blood Glucose Monitoring Suppl (TRUE METRIX METER) w/Device KIT Use as directed 1 kit 0  . glucose blood (TRUE METRIX BLOOD GLUCOSE TEST) test strip Use as instructed 100 each 12  . losartan-hydrochlorothiazide (HYZAAR) 100-12.5 MG tablet Take 1 tablet by mouth daily. 90 tablet 3  . metFORMIN (GLUCOPHAGE) 500 MG tablet Take 1 tablet (500 mg total) by mouth 2 (two) times daily with a meal. 60 tablet 6  . sertraline (ZOLOFT) 100 MG tablet Take 1.5 tablets (150 mg total) by mouth daily. 45 tablet 5  . tiotropium (SPIRIVA) 18 MCG inhalation capsule Place 1 capsule (18 mcg total) into inhaler and inhale daily. 30 capsule 6  . TRUEPLUS LANCETS 28G MISC Use as directed 100 each 2  . traZODone (DESYREL) 100 MG tablet Take 1 tablet (100 mg total) by mouth at bedtime as needed for sleep. (Patient not taking: Reported on 06/06/2019) 90 tablet 0   No facility-administered medications prior to visit.      Review of Systems  Constitutional: Positive for diaphoresis, fatigue and fever.  HENT: Positive for congestion and sore throat.   Eyes: Negative.   Respiratory: Positive for cough and shortness of breath.   Cardiovascular: Positive for leg swelling.   Gastrointestinal: Positive for abdominal pain and diarrhea.  Genitourinary: Negative.   Musculoskeletal: Positive for myalgias.  Skin: Negative.   Neurological: Positive for dizziness, light-headedness and headaches.  Psychiatric/Behavioral: Negative.        Objective:   Physical Exam  This was a video visit the patient was in no acute distress      Assessment & Plan:  I personally reviewed all images and  lab data in the [facility] system as well as any outside material available during this office visit and agree with the  radiology impressions.   COVID-19 virus infection This patient has been ill for over 2 weeks and this occurred right after he received his first [place] vaccine.  I suspect he may actually have Covid infection and not a reaction to the vaccine.  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  The plan here will be for the patient to continue fluid hydration begin vitamin C vitamin D and obtain a Covid test at his local [facility].  We have issued a work note for this patient to stay out of work and another video visit will occur within 1 week Also will prescribe Lomotil for the diarrhea   Diagnoses and all orders for this visit:  COVID-19 virus infection  Other orders  -     diphenoxylate-atropine (LOMOTIL) 2.5-0.025 MG tablet; Take 1 tablet by mouth 4 (four) times daily as needed for diarrhea or loose stools.   Follow Up Instructions: The patient was given a work note and one of his family members will pick up his Lomotil from his local pharmacy and he will go and get a Covid test at this time and a follow-up video visit will be made within the next week   I discussed the assessment and treatment plan with the patient. The patient was provided an opportunity to ask questions and all were answered. The patient agreed with the plan and demonstrated an understanding of the instructions.   The patient was advised to call back or seek an  in-person evaluation if the symptoms worsen or if the condition fails to improve as anticipated   I provided 30 minutes of non-face-to-face time during this encounter  including  median intraservice time , review of notes, labs, imaging, medications  and explaining diagnosis and management to the patient .    Asencion Noble, MD

## 2019-08-04 NOTE — Assessment & Plan Note (Signed)
This patient has been ill for over 2 weeks and this occurred right after he received his first [place] vaccine.  I suspect he may actually have Covid infection and not a reaction to the vaccine.  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  The plan here will be for the patient to continue fluid hydration begin vitamin C vitamin D and obtain a Covid test [facility] local [facility].  We have issued a work note for this patient to stay out of work and another video visit will occur within 1 week Also will prescribe Lomotil for the diarrhea

## 2019-08-04 NOTE — Progress Notes (Signed)
Pfizer Covid Vaccine chills off and on loss stools for 2 weeks, last 2 days nausea, last 2 days swimming headed and faitgue. Per pt he missed a lot of work. Per pt the last day he went to work was 07-23-19.

## 2019-08-07 ENCOUNTER — Other Ambulatory Visit: Payer: Self-pay

## 2019-08-07 ENCOUNTER — Encounter (HOSPITAL_COMMUNITY): Payer: Self-pay | Admitting: Emergency Medicine

## 2019-08-07 DIAGNOSIS — Z7984 Long term (current) use of oral hypoglycemic drugs: Secondary | ICD-10-CM | POA: Insufficient documentation

## 2019-08-07 DIAGNOSIS — R197 Diarrhea, unspecified: Secondary | ICD-10-CM | POA: Insufficient documentation

## 2019-08-07 DIAGNOSIS — E1142 Type 2 diabetes mellitus with diabetic polyneuropathy: Secondary | ICD-10-CM | POA: Insufficient documentation

## 2019-08-07 DIAGNOSIS — Z79899 Other long term (current) drug therapy: Secondary | ICD-10-CM | POA: Insufficient documentation

## 2019-08-07 DIAGNOSIS — F17210 Nicotine dependence, cigarettes, uncomplicated: Secondary | ICD-10-CM | POA: Insufficient documentation

## 2019-08-07 DIAGNOSIS — I10 Essential (primary) hypertension: Secondary | ICD-10-CM | POA: Insufficient documentation

## 2019-08-07 LAB — URINALYSIS, ROUTINE W REFLEX MICROSCOPIC
Glucose, UA: NEGATIVE mg/dL
Hgb urine dipstick: NEGATIVE
Ketones, ur: NEGATIVE mg/dL
Nitrite: NEGATIVE
Protein, ur: >=300 mg/dL — AB
Specific Gravity, Urine: 1.028 (ref 1.005–1.030)
pH: 5 (ref 5.0–8.0)

## 2019-08-07 LAB — CBC
HCT: 45.1 % (ref 39.0–52.0)
Hemoglobin: 14.7 g/dL (ref 13.0–17.0)
MCH: 27.5 pg (ref 26.0–34.0)
MCHC: 32.6 g/dL (ref 30.0–36.0)
MCV: 84.5 fL (ref 80.0–100.0)
Platelets: 332 10*3/uL (ref 150–400)
RBC: 5.34 MIL/uL (ref 4.22–5.81)
RDW: 13.3 % (ref 11.5–15.5)
WBC: 11.1 10*3/uL — ABNORMAL HIGH (ref 4.0–10.5)
nRBC: 0 % (ref 0.0–0.2)

## 2019-08-07 LAB — COMPREHENSIVE METABOLIC PANEL
ALT: 29 U/L (ref 0–44)
AST: 24 U/L (ref 15–41)
Albumin: 4.1 g/dL (ref 3.5–5.0)
Alkaline Phosphatase: 52 U/L (ref 38–126)
Anion gap: 12 (ref 5–15)
BUN: 26 mg/dL — ABNORMAL HIGH (ref 6–20)
CO2: 27 mmol/L (ref 22–32)
Calcium: 9.4 mg/dL (ref 8.9–10.3)
Chloride: 99 mmol/L (ref 98–111)
Creatinine, Ser: 1 mg/dL (ref 0.61–1.24)
GFR calc Af Amer: >60 mL/min (ref >60)
GFR calc non Af Amer: >60 mL/min (ref >60)
Glucose, Bld: 128 mg/dL — ABNORMAL HIGH (ref 70–99)
Potassium: 3.8 mmol/L (ref 3.5–5.1)
Sodium: 138 mmol/L (ref 135–145)
Total Bilirubin: 0.7 mg/dL (ref 0.3–1.2)
Total Protein: 8 g/dL (ref 6.5–8.1)

## 2019-08-07 LAB — LIPASE, BLOOD: Lipase: 204 U/L — ABNORMAL HIGH (ref 11–51)

## 2019-08-07 MED ORDER — SODIUM CHLORIDE 0.9% FLUSH
3.0000 mL | Freq: Once | INTRAVENOUS | Status: AC
  Administered 2019-08-08: 3 mL via INTRAVENOUS

## 2019-08-07 NOTE — ED Triage Notes (Signed)
Pt reports had first Covid vaccine on 4/2. Reports shortly after started having diarrhea, feeling fatigued, no appetite. reports saw his PCP on Thursday and was prescribed meds to slow down bowels, helped some per patient. Reports had Covid test on Friday and got results yesterday that were negative.

## 2019-08-08 ENCOUNTER — Ambulatory Visit: Payer: Self-pay

## 2019-08-08 ENCOUNTER — Emergency Department (HOSPITAL_COMMUNITY)
Admission: EM | Admit: 2019-08-08 | Discharge: 2019-08-08 | Disposition: A | Discharge status: Home / Self Care | Payer: Self-pay | Attending: Emergency Medicine | Admitting: Emergency Medicine

## 2019-08-08 DIAGNOSIS — A09 Infectious gastroenteritis and colitis, unspecified: Secondary | ICD-10-CM

## 2019-08-08 MED ORDER — CIPROFLOXACIN HCL 500 MG PO TABS: 500.0000 mg | ORAL_TABLET | Freq: Two times a day (BID) | ORAL | 0 refills | Status: DC

## 2019-08-08 MED ORDER — SODIUM CHLORIDE 0.9 % IV BOLUS
1000.0000 mL | Freq: Once | INTRAVENOUS | Status: AC
  Administered 2019-08-08: 1000 mL via INTRAVENOUS

## 2019-08-08 MED FILL — CIPROFLOXACIN HCL 500 MG TA: 500 | 5 days supply | Qty: 10 | Fill #0

## 2019-08-08 NOTE — ED Notes (Signed)
Patient tolerating fluids with no difficulty 

## 2019-08-08 NOTE — ED Provider Notes (Signed)
[facility]-EMERGENCY DEPT Provider Note   CSN: [id number] Arrival date & time: 08/07/19  1748     History Chief Complaint  Patient presents with  . Fatigue  . Diarrhea    Juan Kline is a 48 y.o. male with history of diabetes, hypertension, hyperlipidemia who presents with chills, lightheadedness, diarrhea.  He states symptoms started about 3 weeks ago and have been constant.  He had his Covid vaccine about 4 weeks ago was not sure if symptoms could be related or not.  He also had Covid testing and done a couple days ago which were negative.  He denies any abdominal pain but reports feeling hungry but also does not have much of an appetite.  He has had nausea without any vomiting.  His doctor started him on Lomotil which has slowed down his diarrhea but he also has not been eating very much.  He denies fever, headache, URI symptoms, chest pain, shortness of breath, cough.  He states he feels like he has lost 20 pounds.  He denies any recent travel, antibiotics.   HPI     Past Medical History:  Diagnosis Date  . Chronic foot ulcer ([facility]) admitted 03/02/2014   medial border first metatarsal left foot  . Diabetes ([facility])   . Diabetic foot infection ([facility])   . Hyperlipidemia   . Hypertension Dx 2009  . OSA (obstructive sleep apnea)    CPAP  . Osteomyelitis left first metatarsal 03/02/2014  . Sleep apnea   . Venous stasis ulcer of left lower extremity ([facility])    /notes 03/02/2014    Patient Active Problem List   Diagnosis Date Noted  . COVID-19 virus infection 08/04/2019  . COPD mixed type ([facility]) 04/04/2019  . Positive for macroalbuminuria 03/06/2019  . Vapes nicotine containing substance 03/03/2019  . Class 3 severe obesity due to excess calories without serious comorbidity with body mass index (BMI) of 45.0 to 49.9 in adult ([facility]CC) 03/03/2019  . Hyperlipidemia associated with type 2 diabetes mellitus ([facility]) 03/03/2019  . Type 2 diabetes mellitus with diabetic  polyneuropathy, without long-term current use of insulin ([facility]) 04/13/2018  . Mild depression ([facility]) 04/01/2018  . OSA (obstructive sleep apnea) 10/26/2017  . Anxiety disorder 10/26/2017  . Peripheral neuropathy 12/12/2016  . GAD (generalized anxiety disorder) 05/11/2015  . Former smoker 05/11/2015  . Other hyperlipidemia 05/22/2014  . Vitamin D insufficiency 05/22/2014  . Numbness and tingling of foot 05/19/2014  . Venous stasis dermatitis of both lower extremities 05/19/2014  . history of Hypertension 03/02/2014  . Morbid obesity ([facility]) 03/02/2014    Past Surgical History:  Procedure Laterality Date  . NO PAST SURGERIES         Family History  Problem Relation Age of Onset  . Hypertension Mother   . Hyperlipidemia Mother   . Diabetes Mother   . Kidney disease Mother   . Hypertension Father   . Hyperlipidemia Father   . Diabetes Brother   . Hypertension Brother   . Hyperlipidemia Brother   . Heart disease Maternal Grandmother   . COPD Maternal Grandmother   . Diabetes Maternal Grandfather   . Heart attack Paternal Grandmother   . Heart disease Paternal Grandfather     Social History   Tobacco Use  . Smoking status: Current Some Day Smoker    Packs/day: 1.50    Years: 26.00    Pack years: 39.00    Types: E-cigarettes, Cigarettes    Start date: 1989    Last   attempt to quit: 02/16/2014    Years since quitting: 5.4  . Smokeless tobacco: Never Used  Substance Use Topics  . Alcohol use: Yes    Comment: 03/02/2014 "I rarely drink"  . Drug use: No    Home Medications Prior to Admission medications   Medication Sig Start Date End Date Taking? Authorizing Provider  albuterol (VENTOLIN HFA) 108 (90 Base) MCG/ACT inhaler Inhale 2 puffs into the lungs every 6 (six) hours as needed for wheezing or shortness of breath. 02/14/19  Yes Clark, Laura P, DO  amLODipine (NORVASC) 10 MG tablet Take 1 tablet (10 mg total) by mouth daily. 03/03/19  Yes Johnson, Deborah B, MD    atorvastatin (LIPITOR) 40 MG tablet Take 1 tablet (40 mg total) by mouth daily. 03/06/19  Yes Johnson, Deborah B, MD  Blood Glucose Monitoring Suppl (TRUE METRIX METER) w/Device KIT Use as directed 04/13/18  Yes Johnson, Deborah B, MD  diphenoxylate-atropine (LOMOTIL) 2.5-0.025 MG tablet Take 1 tablet by mouth 4 (four) times daily as needed for diarrhea or loose stools. 08/04/19  Yes Wright, Patrick E, MD  glucose blood (TRUE METRIX BLOOD GLUCOSE TEST) test strip Use as instructed 04/13/18  Yes Johnson, Deborah B, MD  losartan-hydrochlorothiazide (HYZAAR) 100-12.5 MG tablet Take 1 tablet by mouth daily. 03/14/19  Yes Hilty, Kenneth C, MD  metFORMIN (GLUCOPHAGE) 500 MG tablet Take 1 tablet (500 mg total) by mouth 2 (two) times daily with a meal. 03/03/19  Yes Johnson, Deborah B, MD  sertraline (ZOLOFT) 100 MG tablet Take 1.5 tablets (150 mg total) by mouth daily. 06/30/19 12/27/19 Yes Pucilowski, Olgierd A, MD  tiotropium (SPIRIVA) 18 MCG inhalation capsule Place 1 capsule (18 mcg total) into inhaler and inhale daily. 02/14/19  Yes Clark, Laura P, DO  TRUEPLUS LANCETS 28G MISC Use as directed 04/13/18  Yes Johnson, Deborah B, MD    Allergies    Lyrica [pregabalin]  Review of Systems   Review of Systems  Constitutional: Positive for appetite change, chills, fatigue and unexpected weight change. Negative for fever.  Respiratory: Negative for cough and shortness of breath.   Cardiovascular: Negative for chest pain.  Gastrointestinal: Positive for diarrhea and nausea. Negative for abdominal pain and vomiting.  Neurological: Positive for light-headedness. Negative for syncope.  All other systems reviewed and are negative.   Physical Exam Updated Vital Signs BP (!) 141/93   Pulse 78   Temp 98.2 F (36.8 C)   Resp 17   SpO2 92%   Physical Exam Vitals and nursing note reviewed.  Constitutional:      General: He is not in acute distress.    Appearance: Normal appearance. He is  well-developed. He is obese. He is not ill-appearing.  HENT:     Head: Normocephalic and atraumatic.  Eyes:     General: No scleral icterus.       Right eye: No discharge.        Left eye: No discharge.     Conjunctiva/sclera: Conjunctivae normal.     Pupils: Pupils are equal, round, and reactive to light.  Cardiovascular:     Rate and Rhythm: Normal rate and regular rhythm.  Pulmonary:     Effort: Pulmonary effort is normal. No respiratory distress.     Breath sounds: Normal breath sounds.  Abdominal:     General: There is no distension.     Palpations: Abdomen is soft.     Tenderness: There is no abdominal tenderness.     Hernia: A hernia (large ventral)   is present.  Musculoskeletal:     Cervical back: Normal range of motion.  Skin:    General: Skin is warm and dry.  Neurological:     Mental Status: He is alert and oriented to person, place, and time.  Psychiatric:        Behavior: Behavior normal.     ED Results / Procedures / Treatments   Labs (all labs ordered are listed, but only abnormal results are displayed) Labs Reviewed  LIPASE, BLOOD - Abnormal; Notable for the following components:      Result Value   Lipase 204 (*)    All other components within normal limits  COMPREHENSIVE METABOLIC PANEL - Abnormal; Notable for the following components:   Glucose, Bld 128 (*)    BUN 26 (*)    All other components within normal limits  CBC - Abnormal; Notable for the following components:   WBC 11.1 (*)    All other components within normal limits  URINALYSIS, ROUTINE W REFLEX MICROSCOPIC - Abnormal; Notable for the following components:   Color, Urine AMBER (*)    APPearance HAZY (*)    Bilirubin Urine SMALL (*)    Protein, ur >=300 (*)    Leukocytes,Ua SMALL (*)    Bacteria, UA RARE (*)    All other components within normal limits  GASTROINTESTINAL PANEL BY PCR, STOOL (REPLACES STOOL CULTURE)  C DIFFICILE QUICK SCREEN W PCR REFLEX     EKG None  Radiology No results found.  Procedures Procedures (including critical care time)  Medications Ordered in ED Medications  sodium chloride flush (NS) 0.9 % injection 3 mL (3 mLs Intravenous Given 08/08/19 0216)  sodium chloride 0.9 % bolus 1,000 mL (0 mLs Intravenous Stopped 08/08/19 0314)    ED Course  I have reviewed the triage vital signs and the nursing notes.  Pertinent labs & imaging results that were available during my care of the patient were reviewed by me and considered in my medical decision making (see chart for details).  48-year-old male presents with chills, lightheadedness, multiple episodes of diarrhea for the past 3 weeks.  Blood pressure is minimally elevated here but otherwise vital signs are normal.  He is overall well-appearing.  Heart is regular rate and rhythm.  Lungs are clear to auscultation.  Abdomen is soft and nontender.  CBC is remarkable for mild leukocytosis (11).  CMP is reassuring.  Lipase is minimally elevated (204) however symptoms are not consistent with pancreatitis as he has no abdominal pain, significant nausea or vomiting.  UA shows small bilirubin, small leukocytes, over 300 protein.  Patient likely has acute diarrheal illness as he is reporting over 10 episodes of loose stools daily.  This has slowed down after Lomotil was prescribed for him but still persistent.  Patient has not had any recent treatment with antibiotics or reported travel.  He was not able to give us a stool sample here.  Do not feel CT imaging is indicated with normal vital signs, no abdominal tenderness, and overall reassuring labs.  He was given IV fluids and will treat empirically for severe diarrheal illness with Cipro twice a day for 5 days.  He is encouraged to follow-up with his doctor.  MDM Rules/Calculators/A&P                      Final Clinical Impression(s) / ED Diagnoses Final diagnoses:  Infectious diarrhea    Rx / DC Orders ED Discharge Orders       None       ,  Marie, PA-C 08/08/19 0448    Palumbo, April, MD 08/08/19 0542  

## 2019-08-08 NOTE — Discharge Instructions (Addendum)
Take Cipro twice a day for 5 days Continue Lomotil Please drink plenty of fluids and follow up with your doctor

## 2019-08-09 ENCOUNTER — Ambulatory Visit: Payer: Self-pay | Attending: Critical Care Medicine | Admitting: Critical Care Medicine

## 2019-08-09 ENCOUNTER — Ambulatory Visit: Payer: Self-pay

## 2019-08-09 ENCOUNTER — Other Ambulatory Visit: Payer: Self-pay

## 2019-08-09 DIAGNOSIS — U071 COVID-19: Secondary | ICD-10-CM

## 2019-08-09 DIAGNOSIS — R197 Diarrhea, unspecified: Secondary | ICD-10-CM

## 2019-08-09 NOTE — Progress Notes (Signed)
Subjective:    Patient ID: Juan Kline, male    DOB: [birth year], 49 y.o.   MRN: [id number] Virtual Visit via Video Note  I connected with@ on 08/10/19 at@ by a video enabled telemedicine application and verified that I am speaking with the correct person using two identifiers.   Consent:  I discussed the limitations, risks, security and privacy concerns of performing an evaluation and management service by video visit and the availability of in person appointments. I also discussed with the patient that there may be a patient responsible charge related to this service. The patient expressed understanding and agreed to proceed.  Location of patient:  Location of provider:  Persons participating in the televisit with the patient.       History of Present Illness: 49 y.o.M  HTN COPD OSA T2DM This is a telemedicine video visit The patient gives a history of having a Pfizer vaccine his first dose on April 2 and he states within 2 days he began having diarrhea fatigue headaches loss of taste and smell nausea muscle aches and severe fatigue The patient has another dose of Covid vaccine due on April 23  The patient has been staying out of work at this time.  He works at [employer] and is exposed to various customers.  He has been keeping up with his fluids.  He denies chest pain.  He states his dyspnea is stable at this time.  He denies a cough.  His diarrhea though is quite excessive.  08/09/2019 Patient is seen return today and continues to have diarrhea and abdominal pain and went to the emergency room 2 days ago.  No imaging studies were done at that visit.  The patient states the Lomotil does help with the diarrhea.  He has loss of appetite fatigue.  His Covid test was negative.  This is a video visit and its difficult to determine from this the status of his abdomen.  I am concerned about pancreatitis and other intra-abdominal processes    Past Medical History:  Diagnosis  Date  . Chronic foot ulcer ([place]) admitted 03/02/2014   medial border first metatarsal left foot  . Diabetes ([place])   . Diabetic foot infection ([place])   . Hyperlipidemia   . Hypertension Dx 2009  . OSA (obstructive sleep apnea)    CPAP  . Osteomyelitis left first metatarsal 03/02/2014  . Sleep apnea   . Venous stasis ulcer of left lower extremity ([place])    [name] 03/02/2014     Family History  Problem Relation Age of Onset  . Hypertension Mother   . Hyperlipidemia Mother   . Diabetes Mother   . Kidney disease Mother   . Hypertension Father   . Hyperlipidemia Father   . Diabetes Brother   . Hypertension Brother   . Hyperlipidemia Brother   . Heart disease Maternal Grandmother   . COPD Maternal Grandmother   . Diabetes Maternal Grandfather   . Heart attack Paternal Grandmother   . Heart disease Paternal Grandfather      Social History   Socioeconomic History  . Marital status: Single    Spouse name: Not on file  . Number of children: 0  . Years of education: Not on file  . Highest education level: Not on file  Occupational History  . Not on file  Tobacco Use  . Smoking status: Current Some Day Smoker    Packs/day: 1.50    Years: 26.00    Pack years: 39.00  Types: E-cigarettes, Cigarettes    Start date: 1989    Last attempt to quit: 02/16/2014    Years since quitting: 5.4  . Smokeless tobacco: Never Used  Substance and Sexual Activity  . Alcohol use: Yes    Comment: 03/02/2014 "I rarely drink"  . Drug use: No  . Sexual activity: Not Currently  Other Topics Concern  . Not on file  Social History Narrative   Pt lives in 1 story home with his mother and step father   9th grade education   Last occupation was with [employer] in 2009      Unemployed at this time- thinking of filing for disability      Right handed      Social Determinants of Health   Financial Resource Strain:   . Difficulty of Paying Living Expenses:   Food Insecurity:   . Worried About  Charity fundraiser in the Last Year:   . Arboriculturist in the Last Year:   Transportation Needs:   . Film/video editor (Medical):   Marland Kitchen Lack of Transportation (Non-Medical):   Physical Activity:   . Days of Exercise per Week:   . Minutes of Exercise per Session:   Stress:   . Feeling of Stress :   Social Connections:   . Frequency of Communication with Friends and Family:   . Frequency of Social Gatherings with Friends and Family:   . Attends Religious Services:   . Active Member of Clubs or Organizations:   . Attends Archivist Meetings:   Marland Kitchen Marital Status:   Intimate Partner Violence:   . Fear of Current or Ex-Partner:   . Emotionally Abused:   Marland Kitchen Physically Abused:   . Sexually Abused:      Allergies  Allergen Reactions  . Lyrica [Pregabalin] Anxiety     Outpatient Medications Prior to Visit  Medication Sig Dispense Refill  . albuterol (VENTOLIN HFA) 108 (90 Base) MCG/ACT inhaler Inhale 2 puffs into the lungs every 6 (six) hours as needed for wheezing or shortness of breath. 6.7 g 11  . amLODipine (NORVASC) 10 MG tablet Take 1 tablet (10 mg total) by mouth daily. 30 tablet 6  . atorvastatin (LIPITOR) 40 MG tablet Take 1 tablet (40 mg total) by mouth daily. 90 tablet 3  . Blood Glucose Monitoring Suppl (TRUE METRIX METER) w/Device KIT Use as directed 1 kit 0  . diphenoxylate-atropine (LOMOTIL) 2.5-0.025 MG tablet Take 1 tablet by mouth 4 (four) times daily as needed for diarrhea or loose stools. 30 tablet 0  . glucose blood (TRUE METRIX BLOOD GLUCOSE TEST) test strip Use as instructed 100 each 12  . sertraline (ZOLOFT) 100 MG tablet Take 1.5 tablets (150 mg total) by mouth daily. 45 tablet 5  . tiotropium (SPIRIVA) 18 MCG inhalation capsule Place 1 capsule (18 mcg total) into inhaler and inhale daily. 30 capsule 6  . TRUEPLUS LANCETS 28G MISC Use as directed 100 each 2  . losartan-hydrochlorothiazide (HYZAAR) 100-12.5 MG tablet Take 1 tablet by mouth daily.  (Patient not taking: Reported on 08/10/2019) 90 tablet 3  . metFORMIN (GLUCOPHAGE) 500 MG tablet Take 1 tablet (500 mg total) by mouth 2 (two) times daily with a meal. 60 tablet 6  . ciprofloxacin (CIPRO) 500 MG tablet Take 1 tablet (500 mg total) by mouth every 12 (twelve) hours. (Patient not taking: Reported on 08/09/2019) 10 tablet 0   No facility-administered medications prior to visit.  Review of Systems  Constitutional: Positive for diaphoresis, fatigue and fever.  HENT: Positive for congestion and sore throat.   Eyes: Negative.   Respiratory: Positive for cough and shortness of breath.   Cardiovascular: Positive for leg swelling.  Gastrointestinal: Positive for abdominal pain and diarrhea.  Genitourinary: Negative.   Musculoskeletal: Positive for myalgias.  Skin: Negative.   Neurological: Positive for dizziness, light-headedness and headaches.  Psychiatric/Behavioral: Negative.        Objective:   Physical Exam  This was a video visit the patient was in no acute distress      Assessment & Plan:  I personally reviewed all images and lab data in the [facility] system as well as any outside material available during this office visit and agree with the  radiology impressions.   Diarrhea of presumed infectious origin Ongoing abdominal pain with diarrheal illness unclear etiology  Plan to bring this patient into the office in 24 hours for direct abdominal exam and further lab studies  COVID-19 virus infection No evidence of Covid infection with negative Covid testing   Diagnoses and all orders for this visit:  Diarrhea of presumed infectious origin  COVID-19 virus infection

## 2019-08-10 ENCOUNTER — Encounter: Payer: Self-pay | Admitting: Critical Care Medicine

## 2019-08-10 ENCOUNTER — Other Ambulatory Visit: Payer: Self-pay

## 2019-08-10 ENCOUNTER — Ambulatory Visit: Payer: Self-pay | Attending: Critical Care Medicine | Admitting: Critical Care Medicine

## 2019-08-10 VITALS — BP 139/88 | HR 85 | Temp 98.2°F | Resp 16 | Wt 326.6 lb

## 2019-08-10 DIAGNOSIS — R1084 Generalized abdominal pain: Secondary | ICD-10-CM

## 2019-08-10 DIAGNOSIS — E1142 Type 2 diabetes mellitus with diabetic polyneuropathy: Secondary | ICD-10-CM

## 2019-08-10 DIAGNOSIS — R197 Diarrhea, unspecified: Secondary | ICD-10-CM

## 2019-08-10 MED ORDER — METFORMIN HCL 500 MG PO TABS: ORAL_TABLET | ORAL | 6 refills | Status: DC

## 2019-08-10 MED ORDER — LOSARTAN POTASSIUM-HCTZ 100-12.5 MG PO TABS: ORAL_TABLET | ORAL | 3 refills | Status: DC

## 2019-08-10 NOTE — Assessment & Plan Note (Signed)
No evidence of Covid infection with negative Covid testing

## 2019-08-10 NOTE — Progress Notes (Signed)
Subjective:    Patient ID: Juan Kline, male    DOB: 01/08/1971, 49 y.o.   MRN: [id number] History of Present Illness: 49 y.o.M  HTN COPD OSA T2DM This is a telemedicine video visit The patient gives a history of having a Pfizer vaccine his first dose on April 2 and he states within 2 days he began having diarrhea fatigue headaches loss of taste and smell nausea muscle aches and severe fatigue The patient has another dose of Covid vaccine due on April 23  The patient has been staying out of work at this time.  He works at [employer] and is exposed to various customers.  He has been keeping up with his fluids.  He denies chest pain.  He states his dyspnea is stable at this time.  He denies a cough.  His diarrhea though is quite excessive.   08/10/2019 The patient returns today for a face-to-face visit to evaluate the patient's abdominal pain and ongoing diarrhea  The patient's continues to have loose stools without blood in the stools.  There is nausea without vomiting.  He has abdominal pain in the epigastric area.  His Covid test did come back negative.  I saw him on a video visit yesterday but need to bring him to the office for a physical exam.  Previous history of morbid obesity and hypertension along with sleep apnea and COPD mixed type with associated history of diabetes peripheral neuropathy and hyperlipidemia  Past Medical History:  Diagnosis Date  . Chronic foot ulcer ([place]) admitted 03/02/2014   medial border first metatarsal left foot  . Diabetes ([place])   . Diabetic foot infection ([place])   . Hyperlipidemia   . Hypertension Dx 2009  . OSA (obstructive sleep apnea)    CPAP  . Osteomyelitis left first metatarsal 03/02/2014  . Sleep apnea   . Venous stasis ulcer of left lower extremity ([place])    [name] 03/02/2014     Family History  Problem Relation Age of Onset  . Hypertension Mother   . Hyperlipidemia Mother   . Diabetes Mother   . Kidney disease Mother   .  Hypertension Father   . Hyperlipidemia Father   . Diabetes Brother   . Hypertension Brother   . Hyperlipidemia Brother   . Heart disease Maternal Grandmother   . COPD Maternal Grandmother   . Diabetes Maternal Grandfather   . Heart attack Paternal Grandmother   . Heart disease Paternal Grandfather      Social History   Socioeconomic History  . Marital status: Single    Spouse name: Not on file  . Number of children: 0  . Years of education: Not on file  . Highest education level: Not on file  Occupational History  . Not on file  Tobacco Use  . Smoking status: Current Some Day Smoker    Packs/day: 1.50    Years: 26.00    Pack years: 39.00    Types: E-cigarettes, Cigarettes    Start date: 1989    Last attempt to quit: 02/16/2014    Years since quitting: 5.4  . Smokeless tobacco: Never Used  Substance and Sexual Activity  . Alcohol use: Yes    Comment: 03/02/2014 "I rarely drink"  . Drug use: No  . Sexual activity: Not Currently  Other Topics Concern  . Not on file  Social History Narrative   Pt lives in 1 story home with his mother and step father   9th grade education  Last occupation was with [employer] in 2009      Unemployed at this time- thinking of filing for disability      Right handed      Social Determinants of Health   Financial Resource Strain:   . Difficulty of Paying Living Expenses:   Food Insecurity:   . Worried About Charity fundraiser in the Last Year:   . Arboriculturist in the Last Year:   Transportation Needs:   . Film/video editor (Medical):   Marland Kitchen Lack of Transportation (Non-Medical):   Physical Activity:   . Days of Exercise per Week:   . Minutes of Exercise per Session:   Stress:   . Feeling of Stress :   Social Connections:   . Frequency of Communication with Friends and Family:   . Frequency of Social Gatherings with Friends and Family:   . Attends Religious Services:   . Active Member of Clubs or Organizations:   .  Attends Archivist Meetings:   Marland Kitchen Marital Status:   Intimate Partner Violence:   . Fear of Current or Ex-Partner:   . Emotionally Abused:   Marland Kitchen Physically Abused:   . Sexually Abused:      Allergies  Allergen Reactions  . Lyrica [Pregabalin] Anxiety     Outpatient Medications Prior to Visit  Medication Sig Dispense Refill  . albuterol (VENTOLIN HFA) 108 (90 Base) MCG/ACT inhaler Inhale 2 puffs into the lungs every 6 (six) hours as needed for wheezing or shortness of breath. 6.7 g 11  . amLODipine (NORVASC) 10 MG tablet Take 1 tablet (10 mg total) by mouth daily. 30 tablet 6  . atorvastatin (LIPITOR) 40 MG tablet Take 1 tablet (40 mg total) by mouth daily. 90 tablet 3  . diphenoxylate-atropine (LOMOTIL) 2.5-0.025 MG tablet Take 1 tablet by mouth 4 (four) times daily as needed for diarrhea or loose stools. 30 tablet 0  . glucose blood (TRUE METRIX BLOOD GLUCOSE TEST) test strip Use as instructed 100 each 12  . sertraline (ZOLOFT) 100 MG tablet Take 1.5 tablets (150 mg total) by mouth daily. 45 tablet 5  . tiotropium (SPIRIVA) 18 MCG inhalation capsule Place 1 capsule (18 mcg total) into inhaler and inhale daily. 30 capsule 6  . metFORMIN (GLUCOPHAGE) 500 MG tablet Take 1 tablet (500 mg total) by mouth 2 (two) times daily with a meal. 60 tablet 6  . Blood Glucose Monitoring Suppl (TRUE METRIX METER) w/Device KIT Use as directed 1 kit 0  . TRUEPLUS LANCETS 28G MISC Use as directed 100 each 2  . ciprofloxacin (CIPRO) 500 MG tablet Take 1 tablet (500 mg total) by mouth every 12 (twelve) hours. (Patient not taking: Reported on 08/09/2019) 10 tablet 0  . losartan-hydrochlorothiazide (HYZAAR) 100-12.5 MG tablet Take 1 tablet by mouth daily. (Patient not taking: Reported on 08/10/2019) 90 tablet 3   No facility-administered medications prior to visit.      Review of Systems  Constitutional: Positive for diaphoresis, fatigue and fever.  HENT: Positive for congestion and sore throat.     Eyes: Negative.   Respiratory: Positive for cough and shortness of breath.   Cardiovascular: Positive for leg swelling.  Gastrointestinal: Positive for abdominal pain and diarrhea.  Genitourinary: Negative.   Musculoskeletal: Positive for myalgias.  Skin: Negative.   Neurological: Positive for dizziness, light-headedness and headaches.  Psychiatric/Behavioral: Negative.        Objective:   Physical Exam  Vitals:   08/10/19 1049  BP:  139/88  Pulse: 85  Resp: 16  Temp: 98.2 F (36.8 C)  SpO2: 94%  Weight: (!) 326 lb 9.6 oz (148.1 kg)    Gen: Pleasant,obese,  in no distress,  normal affect  ENT: No lesions,  mouth clear,  oropharynx clear, no postnasal drip  Neck: No JVD, no TMG, no carotid bruits  Lungs: No use of accessory muscles, no dullness to percussion, clear without rales or rhonchi  Cardiovascular: RRR, heart sounds normal, no murmur or gallops, no peripheral edema  Abdomen: Abdomen is distended, there is no hepatosplenomegaly but there is pinpoint tenderness in the epigastric area without rebound or guarding,  BS normal  Musculoskeletal: No deformities, no cyanosis or clubbing  Neuro: alert, non focal  Skin: Warm, no lesions or rashes    Assessment & Plan:  I personally reviewed all images and lab data in the [facility] system as well as any outside material available during this office visit and agree with the  radiology impressions.   Diarrhea of presumed infectious origin Acute diarrheal illness likely infectious disease in origin  We will hold losartan HCT for now  Obtain GI pathogen panel, C. difficile assay, blood count metabolic panel abdominal CT of the chest and pelvis occult blood in the stool studies  Further treatment will follow pending results   Keen was seen today for follow-up.  Diagnoses and all orders for this visit:  Diarrhea of presumed infectious origin -     GI pathogen panel by PCR, stool -     C difficile Toxins A+B W/Rflx -      CBC with Differential/Platelet -     Comprehensive metabolic panel -     CT ABDOMEN PELVIS W WO CONTRAST; Future -     POCT occult blood stool  Type 2 diabetes mellitus with diabetic polyneuropathy, without long-term current use of insulin (HCC) -     metFORMIN (GLUCOPHAGE) 500 MG tablet; HOLD FOR NOW  Generalized abdominal pain -     GI pathogen panel by PCR, stool -     C difficile Toxins A+B W/Rflx -     CBC with Differential/Platelet -     Comprehensive metabolic panel -     CT ABDOMEN PELVIS W WO CONTRAST; Future -     POCT occult blood stool  Other orders -     losartan-hydrochlorothiazide (HYZAAR) 100-12.5 MG tablet; HOLD FOR NOW

## 2019-08-10 NOTE — Assessment & Plan Note (Signed)
Acute diarrheal illness likely infectious disease in origin  We will hold losartan HCT for now  Obtain GI pathogen panel, C. difficile assay, blood count metabolic panel abdominal CT of the chest and pelvis occult blood in the stool studies  Further treatment will follow pending results

## 2019-08-10 NOTE — Patient Instructions (Addendum)
A CT of abdomen will be obtained  Stool studies will be obtained

## 2019-08-10 NOTE — Progress Notes (Signed)
F/u with Dr. Joya Gaskins.  Abd. Pain, diarrhea

## 2019-08-10 NOTE — Assessment & Plan Note (Signed)
Ongoing abdominal pain with diarrheal illness unclear etiology  Plan to bring this patient into the office in 24 hours for direct abdominal exam and further lab studies

## 2019-08-11 LAB — COMPREHENSIVE METABOLIC PANEL
ALT: 20 IU/L (ref 0–44)
AST: 18 IU/L (ref 0–40)
Albumin/Globulin Ratio: 1.6 (ref 1.2–2.2)
Albumin: 4.4 g/dL (ref 4.0–5.0)
Alkaline Phosphatase: 63 IU/L (ref 39–117)
BUN/Creatinine Ratio: 25 — ABNORMAL HIGH (ref 9–20)
BUN: 19 mg/dL (ref 6–24)
Bilirubin Total: 0.3 mg/dL (ref 0.0–1.2)
CO2: 23 mmol/L (ref 20–29)
Calcium: 9.6 mg/dL (ref 8.7–10.2)
Chloride: 98 mmol/L (ref 96–106)
Creatinine, Ser: 0.75 mg/dL — ABNORMAL LOW (ref 0.76–1.27)
GFR calc Af Amer: 126 mL/min/{1.73_m2} (ref >59)
GFR calc non Af Amer: 109 mL/min/{1.73_m2} (ref >59)
Globulin, Total: 2.7 g/dL (ref 1.5–4.5)
Glucose: 129 mg/dL — ABNORMAL HIGH (ref 65–99)
Potassium: 4.1 mmol/L (ref 3.5–5.2)
Sodium: 137 mmol/L (ref 134–144)
Total Protein: 7.1 g/dL (ref 6.0–8.5)

## 2019-08-11 LAB — CBC WITH DIFFERENTIAL/PLATELET
Basophils Absolute: 0 10*3/uL (ref 0.0–0.2)
Basos: 1 %
EOS (ABSOLUTE): 0.1 10*3/uL (ref 0.0–0.4)
Eos: 1 %
Hematocrit: 42.4 % (ref 37.5–51.0)
Hemoglobin: 14.5 g/dL (ref 13.0–17.7)
Immature Grans (Abs): 0 10*3/uL (ref 0.0–0.1)
Immature Granulocytes: 0 %
Lymphocytes Absolute: 2 10*3/uL (ref 0.7–3.1)
Lymphs: 23 %
MCH: 27.8 pg (ref 26.6–33.0)
MCHC: 34.2 g/dL (ref 31.5–35.7)
MCV: 81 fL (ref 79–97)
Monocytes Absolute: 0.6 10*3/uL (ref 0.1–0.9)
Monocytes: 6 %
Neutrophils Absolute: 6 10*3/uL (ref 1.4–7.0)
Neutrophils: 69 %
Platelets: 301 10*3/uL (ref 150–450)
RBC: 5.21 x10E6/uL (ref 4.14–5.80)
RDW: 13.4 % (ref 11.6–15.4)
WBC: 8.8 10*3/uL (ref 3.4–10.8)

## 2019-08-13 ENCOUNTER — Encounter (HOSPITAL_COMMUNITY): Payer: Self-pay | Admitting: Emergency Medicine

## 2019-08-13 ENCOUNTER — Other Ambulatory Visit: Payer: Self-pay

## 2019-08-13 ENCOUNTER — Emergency Department (HOSPITAL_COMMUNITY)
Admission: EM | Admit: 2019-08-13 | Discharge: 2019-08-13 | Disposition: A | Discharge status: Home / Self Care | Payer: Self-pay | Attending: Emergency Medicine | Admitting: Emergency Medicine

## 2019-08-13 ENCOUNTER — Emergency Department (HOSPITAL_COMMUNITY): Payer: Self-pay

## 2019-08-13 ENCOUNTER — Encounter (HOSPITAL_BASED_OUTPATIENT_CLINIC_OR_DEPARTMENT_OTHER): Payer: Self-pay | Admitting: Emergency Medicine

## 2019-08-13 DIAGNOSIS — Z87891 Personal history of nicotine dependence: Secondary | ICD-10-CM | POA: Insufficient documentation

## 2019-08-13 DIAGNOSIS — I10 Essential (primary) hypertension: Secondary | ICD-10-CM | POA: Insufficient documentation

## 2019-08-13 DIAGNOSIS — Z7984 Long term (current) use of oral hypoglycemic drugs: Secondary | ICD-10-CM | POA: Insufficient documentation

## 2019-08-13 DIAGNOSIS — R0789 Other chest pain: Secondary | ICD-10-CM | POA: Insufficient documentation

## 2019-08-13 DIAGNOSIS — E1142 Type 2 diabetes mellitus with diabetic polyneuropathy: Secondary | ICD-10-CM | POA: Insufficient documentation

## 2019-08-13 DIAGNOSIS — Z79899 Other long term (current) drug therapy: Secondary | ICD-10-CM | POA: Insufficient documentation

## 2019-08-13 DIAGNOSIS — E119 Type 2 diabetes mellitus without complications: Secondary | ICD-10-CM | POA: Insufficient documentation

## 2019-08-13 DIAGNOSIS — R1010 Upper abdominal pain, unspecified: Secondary | ICD-10-CM | POA: Insufficient documentation

## 2019-08-13 DIAGNOSIS — R197 Diarrhea, unspecified: Secondary | ICD-10-CM | POA: Insufficient documentation

## 2019-08-13 DIAGNOSIS — J449 Chronic obstructive pulmonary disease, unspecified: Secondary | ICD-10-CM | POA: Insufficient documentation

## 2019-08-13 DIAGNOSIS — F17210 Nicotine dependence, cigarettes, uncomplicated: Secondary | ICD-10-CM | POA: Insufficient documentation

## 2019-08-13 LAB — CBC
HCT: 43.1 % (ref 39.0–52.0)
Hemoglobin: 13.9 g/dL (ref 13.0–17.0)
MCH: 27.4 pg (ref 26.0–34.0)
MCHC: 32.3 g/dL (ref 30.0–36.0)
MCV: 84.8 fL (ref 80.0–100.0)
Platelets: 280 10*3/uL (ref 150–400)
RBC: 5.08 MIL/uL (ref 4.22–5.81)
RDW: 13.2 % (ref 11.5–15.5)
WBC: 7.8 10*3/uL (ref 4.0–10.5)
nRBC: 0 % (ref 0.0–0.2)

## 2019-08-13 LAB — BASIC METABOLIC PANEL
Anion gap: 15 (ref 5–15)
BUN: 23 mg/dL — ABNORMAL HIGH (ref 6–20)
CO2: 25 mmol/L (ref 22–32)
Calcium: 9.4 mg/dL (ref 8.9–10.3)
Chloride: 98 mmol/L (ref 98–111)
Creatinine, Ser: 0.91 mg/dL (ref 0.61–1.24)
GFR calc Af Amer: >60 mL/min (ref >60)
GFR calc non Af Amer: >60 mL/min (ref >60)
Glucose, Bld: 142 mg/dL — ABNORMAL HIGH (ref 70–99)
Potassium: 3.6 mmol/L (ref 3.5–5.1)
Sodium: 138 mmol/L (ref 135–145)

## 2019-08-13 LAB — PROTIME-INR
INR: 1.2 (ref 0.8–1.2)
Prothrombin Time: 14.3 seconds (ref 11.4–15.2)

## 2019-08-13 LAB — TROPONIN I (HIGH SENSITIVITY)
Troponin I (High Sensitivity): 6 ng/L (ref <18)
Troponin I (High Sensitivity): 6 ng/L (ref <18)

## 2019-08-13 MED ORDER — SODIUM CHLORIDE 0.9% FLUSH: 3.0000 mL | Freq: Once | INTRAVENOUS | Status: DC

## 2019-08-13 NOTE — ED Triage Notes (Signed)
Patient arrived with EMS from home reports central chest pressure with SOB onset this evening , denies emesis or diaphoresis . Patient received ASA 324 mg and 2 NTG sl with relief .

## 2019-08-13 NOTE — ED Triage Notes (Signed)
Brought from home by ems for c/o right upper abdominal pain, generalized weakness.  Seen yesterday at [facility].  Was diagnosed with htn and anxiety.  Also reports diarrhea for 5 days.  Reports they checked him at [facility] but never did a ct scan.

## 2019-08-13 NOTE — Discharge Instructions (Addendum)
Please take your blood pressure once a day and keep a record of it.  Take that record with you when you see your primary care provider.  Please discuss with your primary care provider whether you should have a stress test in light of your risk factors.

## 2019-08-13 NOTE — ED Provider Notes (Signed)
[facility] EMERGENCY DEPARTMENT Provider Note   CSN: [id number] Arrival date & time: 08/13/19  0048   History Chief Complaint  Patient presents with  . Chest Pain    Juan Kline is a 49 y.o. male.  The history is provided by the patient.  Chest Pain He has history of hypertension, diabetes, hyperlipidemia and comes in because of a feeling of a lump in his throat and elevated blood pressure at home.  He relates that several days ago his physician had him discontinue losartan-hydrochlorothiazide.  This evening, he had a sensation like there was a lump in his throat with some slight heaviness in his chest.  There is mild associated dyspnea and nausea but no diaphoresis.  Nothing made the symptoms better, nothing made them worse.  He did check his blood pressure noted it was elevated to 173/123.  He was worried about the blood pressure so he went to the fire station where an ambulance picked him up and brought him to the hospital.  He is a non-smoker and there is no family history of premature coronary atherosclerosis.  He does have history of morbid obesity.  Past Medical History:  Diagnosis Date  . Chronic foot ulcer ([place]) admitted 03/02/2014   medial border first metatarsal left foot  . Diabetes ([place])   . Diabetic foot infection ([place])   . Hyperlipidemia   . Hypertension Dx 2009  . OSA (obstructive sleep apnea)    CPAP  . Osteomyelitis left first metatarsal 03/02/2014  . Sleep apnea   . Venous stasis ulcer of left lower extremity ([place])    [name] 03/02/2014    Patient Active Problem List   Diagnosis Date Noted  . Diarrhea of presumed infectious origin 08/10/2019  . 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 Positive for macroalbuminuria 03/06/2019  . Vapes nicotine containing substance 03/03/2019  . Class 3 severe obesity due to excess calories without serious comorbidity with body mass index (BMI) of 45.0 to 49.9 in adult [facility]) 03/03/2019  . Hyperlipidemia associated  with type 2 diabetes mellitus ([place]) 03/03/2019  . Type 2 diabetes mellitus with diabetic polyneuropathy, without long-term current use of insulin ([place]) 04/13/2018  . Mild depression ([place]) 04/01/2018  . OSA (obstructive sleep apnea) 10/26/2017  . Anxiety disorder 10/26/2017  . Peripheral neuropathy 12/12/2016  . GAD (generalized anxiety disorder) 05/11/2015  . Former smoker 05/11/2015  . Other hyperlipidemia 05/22/2014  . Vitamin D insufficiency 05/22/2014  . Numbness and tingling of foot 05/19/2014  . Venous stasis dermatitis of both lower extremities 05/19/2014  . history of Hypertension 03/02/2014  . Morbid obesity ([place]) 03/02/2014    Past Surgical History:  Procedure Laterality Date  . NO PAST SURGERIES         Family History  Problem Relation Age of Onset  . Hypertension Mother   . Hyperlipidemia Mother   . Diabetes Mother   . Kidney disease Mother   . Hypertension Father   . Hyperlipidemia Father   . Diabetes Brother   . Hypertension Brother   . Hyperlipidemia Brother   . Heart disease Maternal Grandmother   . COPD Maternal Grandmother   . Diabetes Maternal Grandfather   . Heart attack Paternal Grandmother   . Heart disease Paternal Grandfather     Social History   Tobacco Use  . Smoking status: Current Some Day Smoker    Packs/day: 1.50    Years: 26.00    Pack years: 39.00    Types: E-cigarettes, Cigarettes  Start date: 1989    Last attempt to quit: 02/16/2014    Years since quitting: 5.4  . Smokeless tobacco: Never Used  Substance Use Topics  . Alcohol use: Yes    Comment: 03/02/2014 "I rarely drink"  . Drug use: No    Home Medications Prior to Admission medications   Medication Sig Start Date End Date Taking? Authorizing Provider  albuterol (VENTOLIN HFA) 108 (90 Base) MCG/ACT inhaler Inhale 2 puffs into the lungs every 6 (six) hours as needed for wheezing or shortness of breath. 02/14/19   Noemi Chapel P, DO  amLODipine (NORVASC) 10 MG tablet  Take 1 tablet (10 mg total) by mouth daily. 03/03/19   Ladell Pier, MD  atorvastatin (LIPITOR) 40 MG tablet Take 1 tablet (40 mg total) by mouth daily. 03/06/19   Ladell Pier, MD  Blood Glucose Monitoring Suppl (TRUE METRIX METER) w/Device KIT Use as directed 04/13/18   Ladell Pier, MD  diphenoxylate-atropine (LOMOTIL) 2.5-0.025 MG tablet Take 1 tablet by mouth 4 (four) times daily as needed for diarrhea or loose stools. 08/04/19   Elsie Stain, MD  glucose blood (TRUE METRIX BLOOD GLUCOSE TEST) test strip Use as instructed 04/13/18   Ladell Pier, MD  losartan-hydrochlorothiazide [facility]) 100-12.5 MG tablet HOLD FOR NOW 08/10/19   Elsie Stain, MD  metFORMIN (GLUCOPHAGE) 500 MG tablet HOLD FOR NOW 08/10/19   Elsie Stain, MD  sertraline (ZOLOFT) 100 MG tablet Take 1.5 tablets (150 mg total) by mouth daily. 06/30/19 12/27/19  Pucilowski, Marchia Bond, MD  tiotropium (SPIRIVA) 18 MCG inhalation capsule Place 1 capsule (18 mcg total) into inhaler and inhale daily. 02/14/19   Julian Hy, DO  TRUEPLUS LANCETS 28G MISC Use as directed 04/13/18   Ladell Pier, MD    Allergies    Lyrica [pregabalin]  Review of Systems   Review of Systems  Cardiovascular: Positive for chest pain.  All other systems reviewed and are negative.   Physical Exam Updated Vital Signs BP 135/89 (BP Location: Right Arm)   Pulse 86   Temp 97.9 F (36.6 C) (Oral)   Resp 18   Ht _0  (1.753 m)   Wt (!) 155 kg   SpO2 97%   BMI 50.46 kg/m   Physical Exam Vitals and nursing note reviewed.   49 year old male, resting comfortably and in no acute distress. Vital signs are normal. Oxygen saturation is 97%, which is normal. Head is normocephalic and atraumatic. PERRLA, EOMI. Oropharynx is clear. Neck is nontender and supple without adenopathy or JVD. Back is nontender and there is no CVA tenderness. Lungs are clear without rales, wheezes, or rhonchi. Chest is nontender.  Heart has regular rate and rhythm without murmur. Abdomen is soft, flat, nontender without masses or hepatosplenomegaly and peristalsis is normoactive. Extremities have no cyanosis or edema, full range of motion is present. Skin is warm and dry without rash. Neurologic: Mental status is normal, cranial nerves are intact, there are no motor or sensory deficits.  ED Results / Procedures / Treatments   Labs (all labs ordered are listed, but only abnormal results are displayed) Labs Reviewed  BASIC METABOLIC PANEL - Abnormal; Notable for the following components:      Result Value   Glucose, Bld 142 (*)    BUN 23 (*)    All other components within normal limits  CBC  PROTIME-INR  TROPONIN I (HIGH SENSITIVITY)  TROPONIN I (HIGH SENSITIVITY)    EKG  DEJION, GRILLO PI:[id number] 13-Aug-2019 01:00:19 [place] [facility]/ED ROUTINE RECORD Normal sinus rhythm Left axis deviation Low voltage QRS Cannot rule out Anterior infarct , age undetermined Abnormal ECG When compared with ECG of 10/05/2018, No significant change was found Confirmed by Delora Fuel ([id number]) on 08/13/2019 4:15:57 AM 5m/s 174mmV _0  9.0.4 12SL 243 CID: [id number] By: DaDelora Fuelent. rate 89 BPM PR interval 208 ms QRS duration 100 ms QT/QTc 370/450 ms P-R-T axes 41 -47 41 1205/01/[id number]) Male [employer]:PTR1C Loc:11 Technician: [phone] ind:CP  OGCARDIN, NITSCHKEDDD:[id number]-[id number] 03:42:05 [facility]/ED ROUTINE RECORD Sinus rhythm with 1st degree A-V block Low voltage QRS Left anterior fascicular block Cannot rule out Anterior infarct , age undetermined Abnormal ECG When compared with ECG of EARLIER SAME DATE No significant change was found Confirmed by GlDelora [id number] 08/13/2019 4:15:32 AM 2530m 42m22m _1  9.0.4 12SL 243 CID: [id number] By: DaviDelora Fuelt. rate 91 BPM PR interval 212 ms QRS duration 102 ms QT/QTc 372/457 ms P-R-T axes 51  -50 42 [id number]-[id number] y76 Male [employer]:WPT Loc:11 Technician: [phone] ind:WVP:XTGGYn  Radiology DG Chest 2 View  Result Date: 08/13/2019 CLINICAL DATA:  Chest pressure and shortness of breath. EXAM: CHEST - 2 VIEW COMPARISON:  November 15, 2017 FINDINGS: Very mild atelectatic changes are seen within the left lung base. There is no evidence of a pleural effusion or pneumothorax. The heart size and mediastinal contours are within normal limits. Degenerative changes seen within the mid and lower thoracic spine. IMPRESSION: Very mild left basilar atelectasis. Electronically Signed   By: ThadVirgina Norfolk.   On: 08/13/2019 01:51    Procedures Procedures (including critical care time)  Medications Ordered in ED Medications  sodium chloride flush (NS) 0.9 % injection 3 mL (3 mLs Intravenous Not Given 08/13/19 0550)    ED Course  I have reviewed the triage vital signs and the nursing notes.  Pertinent labs & imaging results that were available during my care of the patient were reviewed by me and considered in my medical decision making (see chart for details).  MDM Rules/Calculators/A&P Atypical chest discomfort.  ECG shows low voltage but no abnormal ST or T changes and is unchanged from prior ECG.  Troponin is normal x2.  Chest x-ray is unremarkable.  He is currently pain-free.  Old records are reviewed, and he has 2 prior ED visits for atypical chest pain.  Heart pathway score is 4 which does put him at intermediate risk for major adverse cardiac events, but I feel he can safely be discharged home at this point.  He is advised to monitor his blood pressure at home and discuss with primary care provider whether he should proceed with cardiology referral and outpatient stress testing.  Return precautions discussed.  Final Clinical Impression(s) / ED Diagnoses Final diagnoses:  Atypical chest pain  Elevated blood pressure reading with diagnosis of hypertension    Rx / DC  Orders ED Discharge Orders    None       GlicDelora Fuel [id number]

## 2019-08-13 NOTE — ED Notes (Signed)
Pt discharged from ED; instructions provided; Pt encouraged to return to ED if symptoms worsen and to f/u with PCP; Pt verbalized understanding of all instructions 

## 2019-08-14 ENCOUNTER — Emergency Department (HOSPITAL_BASED_OUTPATIENT_CLINIC_OR_DEPARTMENT_OTHER): Payer: Self-pay

## 2019-08-14 ENCOUNTER — Emergency Department (HOSPITAL_BASED_OUTPATIENT_CLINIC_OR_DEPARTMENT_OTHER)
Admission: EM | Admit: 2019-08-14 | Discharge: 2019-08-14 | Disposition: A | Discharge status: Home / Self Care | Payer: Self-pay | Attending: Emergency Medicine | Admitting: Emergency Medicine

## 2019-08-14 ENCOUNTER — Encounter (HOSPITAL_BASED_OUTPATIENT_CLINIC_OR_DEPARTMENT_OTHER): Payer: Self-pay

## 2019-08-14 DIAGNOSIS — R1010 Upper abdominal pain, unspecified: Secondary | ICD-10-CM

## 2019-08-14 DIAGNOSIS — R197 Diarrhea, unspecified: Secondary | ICD-10-CM

## 2019-08-14 HISTORY — DX: Major depressive disorder, single episode, mild: F32.0

## 2019-08-14 HISTORY — DX: Generalized anxiety disorder: F41.1

## 2019-08-14 HISTORY — DX: Depression, unspecified: F32.A

## 2019-08-14 LAB — CBC WITH DIFFERENTIAL/PLATELET
Abs Immature Granulocytes: 0.03 10*3/uL (ref 0.00–0.07)
Basophils Absolute: 0 10*3/uL (ref 0.0–0.1)
Basophils Relative: 0 %
Eosinophils Absolute: 0.2 10*3/uL (ref 0.0–0.5)
Eosinophils Relative: 2 %
HCT: 42.6 % (ref 39.0–52.0)
Hemoglobin: 14.2 g/dL (ref 13.0–17.0)
Immature Granulocytes: 0 %
Lymphocytes Relative: 25 %
Lymphs Abs: 2.4 10*3/uL (ref 0.7–4.0)
MCH: 27.5 pg (ref 26.0–34.0)
MCHC: 33.3 g/dL (ref 30.0–36.0)
MCV: 82.6 fL (ref 80.0–100.0)
Monocytes Absolute: 0.8 10*3/uL (ref 0.1–1.0)
Monocytes Relative: 8 %
Neutro Abs: 6 10*3/uL (ref 1.7–7.7)
Neutrophils Relative %: 65 %
Platelets: 277 10*3/uL (ref 150–400)
RBC: 5.16 MIL/uL (ref 4.22–5.81)
RDW: 13.4 % (ref 11.5–15.5)
WBC: 9.4 10*3/uL (ref 4.0–10.5)
nRBC: 0 % (ref 0.0–0.2)

## 2019-08-14 LAB — COMPREHENSIVE METABOLIC PANEL
ALT: 28 U/L (ref 0–44)
AST: 27 U/L (ref 15–41)
Albumin: 4 g/dL (ref 3.5–5.0)
Alkaline Phosphatase: 52 U/L (ref 38–126)
Anion gap: 12 (ref 5–15)
BUN: 21 mg/dL — ABNORMAL HIGH (ref 6–20)
CO2: 26 mmol/L (ref 22–32)
Calcium: 9 mg/dL (ref 8.9–10.3)
Chloride: 99 mmol/L (ref 98–111)
Creatinine, Ser: 0.95 mg/dL (ref 0.61–1.24)
GFR calc Af Amer: >60 mL/min (ref >60)
GFR calc non Af Amer: >60 mL/min (ref >60)
Glucose, Bld: 130 mg/dL — ABNORMAL HIGH (ref 70–99)
Potassium: 3.8 mmol/L (ref 3.5–5.1)
Sodium: 137 mmol/L (ref 135–145)
Total Bilirubin: 0.6 mg/dL (ref 0.3–1.2)
Total Protein: 7.8 g/dL (ref 6.5–8.1)

## 2019-08-14 LAB — LIPASE, BLOOD: Lipase: 432 U/L — ABNORMAL HIGH (ref 11–51)

## 2019-08-14 MED ORDER — IOHEXOL 300 MG/ML  SOLN
100.0000 mL | Freq: Once | INTRAMUSCULAR | Status: AC | PRN
  Administered 2019-08-14: 100 mL via INTRAVENOUS

## 2019-08-14 MED ORDER — DICYCLOMINE HCL 20 MG PO TABS
20.0000 mg | ORAL_TABLET | Freq: Three times a day (TID) | ORAL | 0 refills | Status: DC
Start: 2019-08-14 — End: 2019-08-17

## 2019-08-14 MED ORDER — FLUORESCEIN SODIUM 1 MG OP STRP: 1.0000 | ORAL_STRIP | Freq: Once | OPHTHALMIC | Status: DC

## 2019-08-14 MED ORDER — FAMOTIDINE 20 MG PO TABS
20.0000 mg | ORAL_TABLET | Freq: Two times a day (BID) | ORAL | 0 refills | Status: DC
Start: 2019-08-14 — End: 2019-08-24

## 2019-08-14 MED ORDER — TETRACAINE HCL 0.5 % OP SOLN: 2.0000 [drp] | Freq: Once | OPHTHALMIC | Status: DC

## 2019-08-14 NOTE — ED Provider Notes (Signed)
[facility] EMERGENCY DEPARTMENT Provider Note   CSN: [id number] Arrival date & time: 08/13/19  2059     History Chief Complaint  Patient presents with  . Abdominal Pain    Juan Kline is a 49 y.o. male.  Patient presents to the emergency department for evaluation of diarrhea with epigastric discomfort.  Diarrhea started about a week ago, has been having intermittent episodes of chest discomfort and abdominal discomfort.  Was seen in the ER yesterday for chest discomfort and elevated blood pressure.  He is experiencing a different discomfort in the center of his upper abdomen tonight.  He reports that he has felt this on and off for "20 years".        Past Medical History:  Diagnosis Date  . Chronic foot ulcer ([place]) admitted 03/02/2014   medial border first metatarsal left foot  . Diabetes ([place])   . Diabetic foot infection ([place])   . Hyperlipidemia   . Hypertension Dx 2009  . OSA (obstructive sleep apnea)    CPAP  . Osteomyelitis left first metatarsal 03/02/2014  . Sleep apnea   . Venous stasis ulcer of left lower extremity ([place])    [name] 03/02/2014    Patient Active Problem List   Diagnosis Date Noted  . Diarrhea of presumed infectious origin 08/10/2019  . 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 Positive for macroalbuminuria 03/06/2019  . Vapes nicotine containing substance 03/03/2019  . Class 3 severe obesity due to excess calories without serious comorbidity with body mass index (BMI) of 45.0 to 49.9 in adult [facility]) 03/03/2019  . Hyperlipidemia associated with type 2 diabetes mellitus ([place]) 03/03/2019  . Type 2 diabetes mellitus with diabetic polyneuropathy, without long-term current use of insulin ([place]) 04/13/2018  . Mild depression ([place]) 04/01/2018  . OSA (obstructive sleep apnea) 10/26/2017  . Anxiety disorder 10/26/2017  . Peripheral neuropathy 12/12/2016  . GAD (generalized anxiety disorder) 05/11/2015  . Former smoker 05/11/2015  . Other  hyperlipidemia 05/22/2014  . Vitamin D insufficiency 05/22/2014  . Numbness and tingling of foot 05/19/2014  . Venous stasis dermatitis of both lower extremities 05/19/2014  . history of Hypertension 03/02/2014  . Morbid obesity ([place]) 03/02/2014    Past Surgical History:  Procedure Laterality Date  . NO PAST SURGERIES         Family History  Problem Relation Age of Onset  . Hypertension Mother   . Hyperlipidemia Mother   . Diabetes Mother   . Kidney disease Mother   . Hypertension Father   . Hyperlipidemia Father   . Diabetes Brother   . Hypertension Brother   . Hyperlipidemia Brother   . Heart disease Maternal Grandmother   . COPD Maternal Grandmother   . Diabetes Maternal Grandfather   . Heart attack Paternal Grandmother   . Heart disease Paternal Grandfather     Social History   Tobacco Use  . Smoking status: Former Smoker    Packs/day: 1.50    Years: 26.00    Pack years: 39.00    Types: E-cigarettes, Cigarettes    Start date: 1989    Quit date: 02/16/2014    Years since quitting: 5.4  . Smokeless tobacco: Never Used  Substance Use Topics  . Alcohol use: Yes    Comment: 03/02/2014 "I rarely drink"  . Drug use: No    Home Medications Prior to Admission medications   Medication Sig Start Date End Date Taking? Authorizing Provider  albuterol (VENTOLIN HFA) 108 (90 Base) MCG/ACT inhaler Inhale  2 puffs into the lungs every 6 (six) hours as needed for wheezing or shortness of breath. 02/14/19   Noemi Chapel P, DO  amLODipine (NORVASC) 10 MG tablet Take 1 tablet (10 mg total) by mouth daily. 03/03/19   Ladell Pier, MD  atorvastatin (LIPITOR) 40 MG tablet Take 1 tablet (40 mg total) by mouth daily. 03/06/19   Ladell Pier, MD  Blood Glucose Monitoring Suppl (TRUE METRIX METER) w/Device KIT Use as directed 04/13/18   Ladell Pier, MD  dicyclomine (BENTYL) 20 MG tablet Take 1 tablet (20 mg total) by mouth 3 (three) times daily before meals. 08/14/19    Orpah Greek, MD  diphenoxylate-atropine (LOMOTIL) 2.5-0.025 MG tablet Take 1 tablet by mouth 4 (four) times daily as needed for diarrhea or loose stools. 08/04/19   Elsie Stain, MD  famotidine (PEPCID) 20 MG tablet Take 1 tablet (20 mg total) by mouth 2 (two) times daily. 08/14/19   Orpah Greek, MD  glucose blood (TRUE METRIX BLOOD GLUCOSE TEST) test strip Use as instructed 04/13/18   Ladell Pier, MD  losartan-hydrochlorothiazide [facility]) 100-12.5 MG tablet HOLD FOR NOW Patient not taking: Reported on 08/13/2019 08/10/19   Elsie Stain, MD  metFORMIN (GLUCOPHAGE) 500 MG tablet HOLD FOR NOW Patient not taking: Reported on 08/13/2019 08/10/19   Elsie Stain, MD  sertraline (ZOLOFT) 100 MG tablet Take 1.5 tablets (150 mg total) by mouth daily. 06/30/19 12/27/19  Pucilowski, Marchia Bond, MD  tiotropium (SPIRIVA) 18 MCG inhalation capsule Place 1 capsule (18 mcg total) into inhaler and inhale daily. 02/14/19   Julian Hy, DO  TRUEPLUS LANCETS 28G MISC Use as directed 04/13/18   Ladell Pier, MD    Allergies    Lyrica [pregabalin]  Review of Systems   Review of Systems  Gastrointestinal: Positive for abdominal pain and diarrhea.  All other systems reviewed and are negative.   Physical Exam Updated Vital Signs BP 114/76   Pulse 93   Temp 98.2 F (36.8 C) (Oral)   Resp 20   Ht 5' 9" (1.753 m)   Wt (!) 145.2 kg   SpO2 95%   BMI 47.26 kg/m   Physical Exam Vitals and nursing note reviewed.  Constitutional:      General: He is not in acute distress.    Appearance: Normal appearance. He is well-developed.  HENT:     Head: Normocephalic and atraumatic.     Right Ear: Hearing normal.     Left Ear: Hearing normal.     Nose: Nose normal.  Eyes:     Conjunctiva/sclera: Conjunctivae normal.     Pupils: Pupils are equal, round, and reactive to light.  Cardiovascular:     Rate and Rhythm: Regular rhythm.     Heart sounds: S1 normal and S2 normal.  No murmur. No friction rub. No gallop.   Pulmonary:     Effort: Pulmonary effort is normal. No respiratory distress.     Breath sounds: Normal breath sounds.  Chest:     Chest wall: No tenderness.  Abdominal:     General: Bowel sounds are normal.     Palpations: Abdomen is soft.     Tenderness: There is abdominal tenderness in the epigastric area. There is no guarding or rebound. Negative signs include Murphy's sign and McBurney's sign.     Hernia: No hernia is present.  Musculoskeletal:        General: Normal range of motion.  Cervical back: Normal range of motion and neck supple.  Skin:    General: Skin is warm and dry.     Findings: No rash.  Neurological:     Mental Status: He is alert and oriented to person, place, and time.     GCS: GCS eye subscore is 4. GCS verbal subscore is 5. GCS motor subscore is 6.     Cranial Nerves: No cranial nerve deficit.     Sensory: No sensory deficit.     Coordination: Coordination normal.  Psychiatric:        Speech: Speech normal.        Behavior: Behavior normal.        Thought Content: Thought content normal.     ED Results / Procedures / Treatments   Labs (all labs ordered are listed, but only abnormal results are displayed) Labs Reviewed  COMPREHENSIVE METABOLIC PANEL - Abnormal; Notable for the following components:      Result Value   Glucose, Bld 130 (*)    BUN 21 (*)    All other components within normal limits  LIPASE, BLOOD - Abnormal; Notable for the following components:   Lipase 432 (*)    All other components within normal limits  CBC WITH DIFFERENTIAL/PLATELET    EKG EKG Interpretation  Date/Time:  Saturday Aug 13 2019 21:09:01 EDT Ventricular Rate:  87 PR Interval:  222 QRS Duration: 96 QT Interval:  378 QTC Calculation: 454 R Axis:   -55 Text Interpretation: Sinus rhythm with 1st degree A-V block Low voltage QRS Left anterior fascicular block Cannot rule out Anterior infarct , age undetermined Abnormal  ECG Confirmed by Veryl Speak [phone]) on 08/13/2019 9:43:23 PM   Radiology DG Chest 2 View  Result Date: 08/13/2019 CLINICAL DATA:  Chest pressure and shortness of breath. EXAM: CHEST - 2 VIEW COMPARISON:  November 15, 2017 FINDINGS: Very mild atelectatic changes are seen within the left lung base. There is no evidence of a pleural effusion or pneumothorax. The heart size and mediastinal contours are within normal limits. Degenerative changes seen within the mid and lower thoracic spine. IMPRESSION: Very mild left basilar atelectasis. Electronically Signed   By: Virgina Norfolk M.D.   On: 08/13/2019 01:51   CT ABDOMEN PELVIS W CONTRAST  Result Date: 08/14/2019 CLINICAL DATA:  Upper abdominal pain and diarrhea EXAM: CT ABDOMEN AND PELVIS WITH CONTRAST TECHNIQUE: Multidetector CT imaging of the abdomen and pelvis was performed using the standard protocol following bolus administration of intravenous contrast. CONTRAST:  128m OMNIPAQUE IOHEXOL 300 MG/ML  SOLN COMPARISON:  None. FINDINGS: LOWER CHEST: Normal. HEPATOBILIARY: Normal hepatic contours. No intra- or extrahepatic biliary dilatation. The gallbladder is normal. PANCREAS: Normal pancreas. No ductal dilatation or peripancreatic fluid collection. SPLEEN: Multiple calcified granulomata. ADRENALS/URINARY TRACT: The adrenal glands are normal. No hydronephrosis, nephroureterolithiasis or solid renal mass. The urinary bladder is normal for degree of distention STOMACH/BOWEL: There is no hiatal hernia. Normal duodenal course and caliber. No small bowel dilatation or inflammation. No focal colonic abnormality. The appendix is not visualized. No right lower quadrant inflammation or free fluid. VASCULAR/LYMPHATIC: There is calcific atherosclerosis of the abdominal aorta. No abdominal or pelvic lymphadenopathy. REPRODUCTIVE: There are calcifications within the normal-sized prostate. Symmetric seminal vesicles. MUSCULOSKELETAL. No bony spinal canal stenosis or focal  osseous abnormality. OTHER: Fat containing ventral abdominal hernia. IMPRESSION: 1. No acute abnormality of the abdomen or pelvis. 2. Fat containing ventral abdominal hernia. 3. Aortic Atherosclerosis (ICD10-I70.0). Electronically Signed   By: KLennette Bihari  [place] M.D.   On: 08/14/2019 02:45    Procedures Procedures (including critical care time)  Medications Ordered in ED Medications  iohexol (OMNIPAQUE) 300 MG/ML solution 100 mL (100 mLs Intravenous Contrast Given 08/14/19 0214)    ED Course  I have reviewed the triage vital signs and the nursing notes.  Pertinent labs & imaging results that were available during my care of the patient were reviewed by me and considered in my medical decision making (see chart for details).    MDM Rules/Calculators/A&P                      Patient presents to the emergency department for evaluation of epigastric discomfort.  Pain is into the right upper abdomen as well.  Reports that he has felt this intermittently over the years.  Lately has been having chest pain as well as abdominal pain, has been seen in the ER multiple times.  He recently had his blood pressure medication stopped and his blood pressures have been running high as well at home.  He is not experiencing any chest discomfort today.  Lab work-up does reveal elevated lipase.  Reviewing his records reveals he was elevated one of his previous ER visits as well.  Current presentation not consistent with acute pancreatitis, however.  Etiology and significance of this is unclear.  Patient appears well, he is pain-free currently.  He does not require hospitalization but will require follow-up with GI to further evaluate complaints.  He is going to restart his blood pressure medication. Final Clinical Impression(s) / ED Diagnoses Final diagnoses:  Pain of upper abdomen  Diarrhea, unspecified type    Rx / DC Orders ED Discharge Orders         Ordered    dicyclomine (BENTYL) 20 MG tablet  3 times daily  before meals     08/14/19 0456    famotidine (PEPCID) 20 MG tablet  2 times daily     08/14/19 0456           Orpah Greek, MD 08/14/19 [phone]

## 2019-08-14 NOTE — ED Notes (Signed)
MD at bedside. 

## 2019-08-14 NOTE — ED Notes (Signed)
Pt's ride is here.

## 2019-08-14 NOTE — ED Notes (Signed)
Pt is waiting for his ride home. Sprite and peanut crackers given.

## 2019-08-15 ENCOUNTER — Encounter (HOSPITAL_BASED_OUTPATIENT_CLINIC_OR_DEPARTMENT_OTHER): Payer: Self-pay | Admitting: *Deleted

## 2019-08-15 ENCOUNTER — Encounter (HOSPITAL_BASED_OUTPATIENT_CLINIC_OR_DEPARTMENT_OTHER): Payer: Self-pay | Admitting: Emergency Medicine

## 2019-08-15 ENCOUNTER — Telehealth: Payer: Self-pay | Admitting: Internal Medicine

## 2019-08-15 ENCOUNTER — Other Ambulatory Visit: Payer: Self-pay

## 2019-08-15 ENCOUNTER — Telehealth (HOSPITAL_BASED_OUTPATIENT_CLINIC_OR_DEPARTMENT_OTHER): Payer: Self-pay | Admitting: Emergency Medicine

## 2019-08-15 ENCOUNTER — Emergency Department (HOSPITAL_BASED_OUTPATIENT_CLINIC_OR_DEPARTMENT_OTHER)
Admission: EM | Admit: 2019-08-15 | Discharge: 2019-08-15 | Disposition: A | Discharge status: Home / Self Care | Payer: Self-pay | Attending: Emergency Medicine | Admitting: Emergency Medicine

## 2019-08-15 DIAGNOSIS — E785 Hyperlipidemia, unspecified: Secondary | ICD-10-CM | POA: Insufficient documentation

## 2019-08-15 DIAGNOSIS — Z7984 Long term (current) use of oral hypoglycemic drugs: Secondary | ICD-10-CM | POA: Insufficient documentation

## 2019-08-15 DIAGNOSIS — R195 Other fecal abnormalities: Secondary | ICD-10-CM | POA: Insufficient documentation

## 2019-08-15 DIAGNOSIS — R748 Abnormal levels of other serum enzymes: Secondary | ICD-10-CM

## 2019-08-15 DIAGNOSIS — R1013 Epigastric pain: Secondary | ICD-10-CM | POA: Insufficient documentation

## 2019-08-15 DIAGNOSIS — J449 Chronic obstructive pulmonary disease, unspecified: Secondary | ICD-10-CM | POA: Insufficient documentation

## 2019-08-15 DIAGNOSIS — Z888 Allergy status to other drugs, medicaments and biological substances status: Secondary | ICD-10-CM | POA: Insufficient documentation

## 2019-08-15 DIAGNOSIS — I10 Essential (primary) hypertension: Secondary | ICD-10-CM | POA: Insufficient documentation

## 2019-08-15 DIAGNOSIS — Z79899 Other long term (current) drug therapy: Secondary | ICD-10-CM | POA: Insufficient documentation

## 2019-08-15 DIAGNOSIS — R197 Diarrhea, unspecified: Secondary | ICD-10-CM

## 2019-08-15 DIAGNOSIS — E1169 Type 2 diabetes mellitus with other specified complication: Secondary | ICD-10-CM | POA: Insufficient documentation

## 2019-08-15 DIAGNOSIS — K921 Melena: Secondary | ICD-10-CM

## 2019-08-15 LAB — COMPREHENSIVE METABOLIC PANEL
ALT: 33 U/L (ref 0–44)
AST: 34 U/L (ref 15–41)
Albumin: 4.1 g/dL (ref 3.5–5.0)
Alkaline Phosphatase: 56 U/L (ref 38–126)
Anion gap: 15 (ref 5–15)
BUN: 23 mg/dL — ABNORMAL HIGH (ref 6–20)
CO2: 23 mmol/L (ref 22–32)
Calcium: 9.5 mg/dL (ref 8.9–10.3)
Chloride: 98 mmol/L (ref 98–111)
Creatinine, Ser: 0.86 mg/dL (ref 0.61–1.24)
GFR calc Af Amer: >60 mL/min (ref >60)
GFR calc non Af Amer: >60 mL/min (ref >60)
Glucose, Bld: 184 mg/dL — ABNORMAL HIGH (ref 70–99)
Potassium: 3.6 mmol/L (ref 3.5–5.1)
Sodium: 136 mmol/L (ref 135–145)
Total Bilirubin: 0.3 mg/dL (ref 0.3–1.2)
Total Protein: 7.9 g/dL (ref 6.5–8.1)

## 2019-08-15 LAB — CBC
HCT: 43.7 % (ref 39.0–52.0)
Hemoglobin: 14.8 g/dL (ref 13.0–17.0)
MCH: 27.7 pg (ref 26.0–34.0)
MCHC: 33.9 g/dL (ref 30.0–36.0)
MCV: 81.7 fL (ref 80.0–100.0)
Platelets: 316 10*3/uL (ref 150–400)
RBC: 5.35 MIL/uL (ref 4.22–5.81)
RDW: 13.4 % (ref 11.5–15.5)
WBC: 10.9 10*3/uL — ABNORMAL HIGH (ref 4.0–10.5)
nRBC: 0 % (ref 0.0–0.2)

## 2019-08-15 LAB — URINALYSIS, MICROSCOPIC (REFLEX): WBC, UA: NONE SEEN WBC/hpf (ref 0–5)

## 2019-08-15 LAB — URINALYSIS, ROUTINE W REFLEX MICROSCOPIC
Bilirubin Urine: NEGATIVE
Glucose, UA: NEGATIVE mg/dL
Ketones, ur: NEGATIVE mg/dL
Leukocytes,Ua: NEGATIVE
Nitrite: NEGATIVE
Protein, ur: 100 mg/dL — AB
Specific Gravity, Urine: 1.025 (ref 1.005–1.030)
pH: 6 (ref 5.0–8.0)

## 2019-08-15 LAB — C DIFFICILE TOXINS A+B W/RFLX: C difficile Toxins A+B, EIA: NEGATIVE

## 2019-08-15 LAB — C DIFFICILE, CYTOTOXIN B

## 2019-08-15 LAB — LIPASE, BLOOD: Lipase: 37 U/L (ref 11–51)

## 2019-08-15 LAB — OCCULT BLOOD X 1 CARD TO LAB, STOOL: Fecal Occult Bld: POSITIVE — AB

## 2019-08-15 MED ORDER — PANTOPRAZOLE SODIUM 20 MG PO TBEC: 20.0000 mg | DELAYED_RELEASE_TABLET | Freq: Every day | ORAL | 0 refills | Status: DC

## 2019-08-15 MED ORDER — PANTOPRAZOLE SODIUM 40 MG IV SOLR
40.0000 mg | Freq: Once | INTRAVENOUS | Status: AC
  Administered 2019-08-15: 40 mg via INTRAVENOUS
  Filled 2019-08-15: qty 40

## 2019-08-15 MED FILL — ?ATORVASTATIN 40MG TABLET: 40 | 30 days supply | Qty: 30 | Fill #5

## 2019-08-15 MED FILL — FAMOTIDINE 20 MG TABS: 20 | 30 days supply | Qty: 60 | Fill #0

## 2019-08-15 MED FILL — DICYCLOMINE 20 MG TABLET: 20 | 10 days supply | Qty: 30 | Fill #0

## 2019-08-15 MED FILL — AMLODIPINE BESYLATE 10 MG T: 10 | 30 days supply | Qty: 30 | Fill #5

## 2019-08-15 NOTE — ED Triage Notes (Signed)
States he had a black stool today. States he has been seen numerous times in the past week and nothing was found concerning. He does have an elevated lipase. Appears anxious. He has an appointment in 3 days to see a GI specialist.

## 2019-08-15 NOTE — ED Provider Notes (Signed)
[facility] EMERGENCY DEPARTMENT Provider Note   CSN: [id number] Arrival date & time: 08/15/19  1743     History Chief Complaint  Patient presents with  . Abdominal Pain    Juan Kline is a 49 y.o. male with PMHx HTN, HLD, GAD, diabetes, and sleep apnea who presents to the ED today with complaint of dark colored stool that he noticed this AM. Pt also endorses intermittent epigastric abdominal pain that has been present for "20 years." He reports that he was recently seen in the ED for same and had a CT scan which was negative. Pt states he has a family member that is in the medical field and was told to come to the ED for further evaluation due to black colored stool. Pt was recently prescribed famotidine and just started taking it today. He has an appointment with gastroenterology in 3 days. Pt denies fevers, chills, chest pain, SOB, nausea, vomiting, coffee ground emesis, urinary sx, testicular pain, or any other associated symptoms.   Pt reports his lipase was elevated during his last ED visit and he is concerned regarding that as well. He denies heavy EtOH use.   Per char review: Seen in the ED yesterday for epigastric pain. ED workup including CBC, CMP, and lipase. Lipase noted to be elevated at 432. No elevation in LFTs. CT scan without acute findings. Discharged home with Bentyl and Pepcid.   IMPRESSION: 1. No acute abnormality of the abdomen or pelvis. 2. Fat containing ventral abdominal hernia. 3. Aortic Atherosclerosis   The history is provided by the patient and medical records.       Past Medical History:  Diagnosis Date  . Chronic foot ulcer ([place]) admitted 03/02/2014   medial border first metatarsal left foot  . Diabetes ([place])   . Diabetic foot infection ([place])   . GAD (generalized anxiety disorder)   . Hyperlipidemia   . Hypertension Dx 2009  . Mild depression ([place])   . OSA (obstructive sleep apnea)    CPAP  . Osteomyelitis left first metatarsal 03/02/2014    . Sleep apnea   . Venous stasis ulcer of left lower extremity ([place])    [name] 03/02/2014    Patient Active Problem List   Diagnosis Date Noted  . Elevated lipase 08/15/2019  . Epigastric pain 08/15/2019  . Diarrhea of presumed infectious origin 08/10/2019  . 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 Positive for macroalbuminuria 03/06/2019  . Vapes nicotine containing substance 03/03/2019  . Class 3 severe obesity due to excess calories without serious comorbidity with body mass index (BMI) of 45.0 to 49.9 in adult [facility]) 03/03/2019  . Hyperlipidemia associated with type 2 diabetes mellitus ([place]) 03/03/2019  . Type 2 diabetes mellitus with diabetic polyneuropathy, without long-term current use of insulin ([place]) 04/13/2018  . Mild depression ([place]) 04/01/2018  . OSA (obstructive sleep apnea) 10/26/2017  . Anxiety disorder 10/26/2017  . Peripheral neuropathy 12/12/2016  . GAD (generalized anxiety disorder) 05/11/2015  . Former smoker 05/11/2015  . Other hyperlipidemia 05/22/2014  . Vitamin D insufficiency 05/22/2014  . Numbness and tingling of foot 05/19/2014  . Venous stasis dermatitis of both lower extremities 05/19/2014  . history of Hypertension 03/02/2014  . Morbid obesity ([place]) 03/02/2014    Past Surgical History:  Procedure Laterality Date  . NO PAST SURGERIES         Family History  Problem Relation Age of Onset  . Hypertension Mother   . Hyperlipidemia Mother   . Diabetes Mother   .  Kidney disease Mother   . Hypertension Father   . Hyperlipidemia Father   . Diabetes Brother   . Hypertension Brother   . Hyperlipidemia Brother   . Heart disease Maternal Grandmother   . COPD Maternal Grandmother   . Diabetes Maternal Grandfather   . Heart attack Paternal Grandmother   . Heart disease Paternal Grandfather     Social History   Tobacco Use  . Smoking status: Former Smoker    Packs/day: 1.50    Years: 26.00    Pack years: 39.00    Types: E-cigarettes, Cigarettes     Start date: 1989    Quit date: 02/16/2014    Years since quitting: 5.4  . Smokeless tobacco: Never Used  Substance Use Topics  . Alcohol use: Yes    Comment: 03/02/2014 "I rarely drink"  . Drug use: No    Home Medications Prior to Admission medications   Medication Sig Start Date End Date Taking? Authorizing Provider  albuterol (VENTOLIN HFA) 108 (90 Base) MCG/ACT inhaler Inhale 2 puffs into the lungs every 6 (six) hours as needed for wheezing or shortness of breath. 02/14/19   Noemi Chapel P, DO  amLODipine (NORVASC) 10 MG tablet Take 1 tablet (10 mg total) by mouth daily. 03/03/19   Ladell Pier, MD  atorvastatin (LIPITOR) 40 MG tablet Take 1 tablet (40 mg total) by mouth daily. 03/06/19   Ladell Pier, MD  Blood Glucose Monitoring Suppl (TRUE METRIX METER) w/Device KIT Use as directed 04/13/18   Ladell Pier, MD  dicyclomine (BENTYL) 20 MG tablet Take 1 tablet (20 mg total) by mouth 3 (three) times daily before meals. 08/14/19   Orpah Greek, MD  diphenoxylate-atropine (LOMOTIL) 2.5-0.025 MG tablet Take 1 tablet by mouth 4 (four) times daily as needed for diarrhea or loose stools. 08/04/19   Elsie Stain, MD  famotidine (PEPCID) 20 MG tablet Take 1 tablet (20 mg total) by mouth 2 (two) times daily. 08/14/19   Orpah Greek, MD  glucose blood (TRUE METRIX BLOOD GLUCOSE TEST) test strip Use as instructed 04/13/18   Ladell Pier, MD  losartan-hydrochlorothiazide [facility]) 100-12.5 MG tablet HOLD FOR NOW Patient not taking: Reported on 08/13/2019 08/10/19   Elsie Stain, MD  metFORMIN (GLUCOPHAGE) 500 MG tablet HOLD FOR NOW Patient not taking: Reported on 08/13/2019 08/10/19   Elsie Stain, MD  pantoprazole (PROTONIX) 20 MG tablet Take 1 tablet (20 mg total) by mouth daily. 08/15/19 09/14/19  Eustaquio Maize, PA-C  sertraline (ZOLOFT) 100 MG tablet Take 1.5 tablets (150 mg total) by mouth daily. 06/30/19 12/27/19  Pucilowski, Marchia Bond, MD   tiotropium (SPIRIVA) 18 MCG inhalation capsule Place 1 capsule (18 mcg total) into inhaler and inhale daily. 02/14/19   Julian Hy, DO  TRUEPLUS LANCETS 28G MISC Use as directed 04/13/18   Ladell Pier, MD    Allergies    Lyrica [pregabalin]  Review of Systems   Review of Systems  Constitutional: Negative for chills and fever.  Gastrointestinal: Positive for abdominal pain and blood in stool. Negative for nausea and vomiting.  All other systems reviewed and are negative.   Physical Exam Updated Vital Signs BP 121/90   Pulse (!) 102   Temp 99 F (37.2 C) (Oral)   Ht _0  (1.753 m)   Wt (!) 145.2 kg   SpO2 96%   BMI 47.27 kg/m   Physical Exam Vitals and nursing note reviewed.  Constitutional:  Appearance: He is obese. He is not ill-appearing or diaphoretic.  HENT:     Head: Normocephalic and atraumatic.  Eyes:     Conjunctiva/sclera: Conjunctivae normal.  Cardiovascular:     Rate and Rhythm: Normal rate and regular rhythm.     Heart sounds: Normal heart sounds.  Pulmonary:     Effort: Pulmonary effort is normal.     Breath sounds: Normal breath sounds. No wheezing, rhonchi or rales.  Abdominal:     General: Bowel sounds are normal.     Palpations: Abdomen is soft.     Tenderness: There is abdominal tenderness in the epigastric area. There is no right CVA tenderness, left CVA tenderness, guarding or rebound.  Genitourinary:    Rectum: Guaiac result positive.     Comments: Chaperone present for exam. No external hemorrhoid appreciated. Light brown stool on gloved finger.  Musculoskeletal:     Cervical back: Neck supple.  Skin:    General: Skin is warm and dry.  Neurological:     Mental Status: He is alert.     ED Results / Procedures / Treatments   Labs (all labs ordered are listed, but only abnormal results are displayed) Labs Reviewed  COMPREHENSIVE METABOLIC PANEL - Abnormal; Notable for the following components:      Result Value    Glucose, Bld 184 (*)    BUN 23 (*)    All other components within normal limits  CBC - Abnormal; Notable for the following components:   WBC 10.9 (*)    All other components within normal limits  URINALYSIS, ROUTINE W REFLEX MICROSCOPIC - Abnormal; Notable for the following components:   Hgb urine dipstick TRACE (*)    Protein, ur 100 (*)    All other components within normal limits  URINALYSIS, MICROSCOPIC (REFLEX) - Abnormal; Notable for the following components:   Bacteria, UA RARE (*)    All other components within normal limits  OCCULT BLOOD X 1 CARD TO LAB, STOOL - Abnormal; Notable for the following components:   Fecal Occult Bld POSITIVE (*)    All other components within normal limits  LIPASE, BLOOD    EKG None  Radiology CT ABDOMEN PELVIS W CONTRAST  Result Date: 08/14/2019 CLINICAL DATA:  Upper abdominal pain and diarrhea EXAM: CT ABDOMEN AND PELVIS WITH CONTRAST TECHNIQUE: Multidetector CT imaging of the abdomen and pelvis was performed using the standard protocol following bolus administration of intravenous contrast. CONTRAST:  151m OMNIPAQUE IOHEXOL 300 MG/ML  SOLN COMPARISON:  None. FINDINGS: LOWER CHEST: Normal. HEPATOBILIARY: Normal hepatic contours. No intra- or extrahepatic biliary dilatation. The gallbladder is normal. PANCREAS: Normal pancreas. No ductal dilatation or peripancreatic fluid collection. SPLEEN: Multiple calcified granulomata. ADRENALS/URINARY TRACT: The adrenal glands are normal. No hydronephrosis, nephroureterolithiasis or solid renal mass. The urinary bladder is normal for degree of distention STOMACH/BOWEL: There is no hiatal hernia. Normal duodenal course and caliber. No small bowel dilatation or inflammation. No focal colonic abnormality. The appendix is not visualized. No right lower quadrant inflammation or free fluid. VASCULAR/LYMPHATIC: There is calcific atherosclerosis of the abdominal aorta. No abdominal or pelvic lymphadenopathy. REPRODUCTIVE:  There are calcifications within the normal-sized prostate. Symmetric seminal vesicles. MUSCULOSKELETAL. No bony spinal canal stenosis or focal osseous abnormality. OTHER: Fat containing ventral abdominal hernia. IMPRESSION: 1. No acute abnormality of the abdomen or pelvis. 2. Fat containing ventral abdominal hernia. 3. Aortic Atherosclerosis (ICD10-I70.0). Electronically Signed   By: KUlyses JarredM.D.   On: 08/14/2019 02:45    Procedures  Procedures (including critical care time)  Medications Ordered in ED Medications  pantoprazole (PROTONIX) injection 40 mg (40 mg Intravenous Given 08/15/19 1958)    ED Course  I have reviewed the triage vital signs and the nursing notes.  Pertinent labs & imaging results that were available during my care of the patient were reviewed by me and considered in my medical decision making (see chart for details).  Clinical Course as of Aug 15 2103  Mon Aug 15, 2019  3845 Fecal Occult Blood, POC(!): POSITIVE [MV]    Clinical Course User Index [MV] Eustaquio Maize, [place]   MDM Rules/Calculators/A&P                      49 year old male who presents to the ED today with complaint of dark-colored stool that he noticed this morning.  He also complains of epigastric abdominal pain for which she has been seen multiple times in the ED.  Has an outpatient GI follow-up in 3 days time.  On arrival to the ED patient is afebrile, nontachycardic nontachypneic.  He appears to be in no acute distress.  He has some epigastric tenderness to palpation however no peritoneal signs.  Will obtain lab work at this time.  Chaperone present for rectal exam, no obvious hernias externally.  He has light brown stool on gloved finger.  It has however returned positive.  Given this with complaint of dark-colored stool will provide Protonix IV.  He is hemodynamically stable.  Anticipate he will be discharged home if hgb stable.   CBC with very mild elevation of WBC at 10.9 however do not  suspect infectious etiology today. Hgb stable at 14.8 CMP with glucose 184, BUN 23. No other findings. LFTs within normal limits Lipase 37 U/A without infection FOBT + today. Given this will provide IV protonix however feel pt is stable for discharge home with close outpatient GI follow up in 3 days as initially scheduled.   Protonix given. Pt to be discharged home with same. Have encouraged pt to monitor his symptoms at home and if any worsening symptoms he will need to come back to the ED immediately. He is otherwise advised to keep appointment with GI in 3 days time. Pt is in agreement with plan and stable for discharge home.   This note was prepared using Dragon voice recognition software and may include unintentional dictation errors due to the inherent limitations of voice recognition software.  Final Clinical Impression(s) / ED Diagnoses Final diagnoses:  Epigastric pain  Blood in stool    Rx / DC Orders ED Discharge Orders         Ordered    pantoprazole (PROTONIX) 20 MG tablet  Daily     08/15/19 2103           Discharge Instructions     Please pick up medication and take as prescribed on top of the Famotidine that you are already taking Keep your follow up appointment with GI as scheduled in 3 days time Continue monitoring  your symptoms at home. Return to the ED IMMEDIATELY (sooner than your GI appointment) for any worsening symptoms including feelings of lightheadedness/dizziness, blurry vision/double vision/loss of vision, chest pain, shortness of breath, worsening abdominal pain, vomiting blood or what appears to be ground up coffee beans.        Eustaquio Maize, PA-C 08/15/19 2105    Margette Fast, MD 08/16/19 [phone]

## 2019-08-15 NOTE — Telephone Encounter (Signed)
Patient called saying that he received a call from Dr. Joya Gaskins this morning. Please f/u

## 2019-08-15 NOTE — Discharge Instructions (Signed)
Please pick up medication and take as prescribed on top of the Famotidine that you are already taking Keep your follow up appointment with GI as scheduled in 3 days time Continue monitoring  your symptoms at home. Return to the ED IMMEDIATELY (sooner than your GI appointment) for any worsening symptoms including feelings of lightheadedness/dizziness, blurry vision/double vision/loss of vision, chest pain, shortness of breath, worsening abdominal pain, vomiting blood or what appears to be ground up coffee beans.

## 2019-08-15 NOTE — Telephone Encounter (Signed)
I spoke to the patient.   Has elevated lipase unclear etiology  Needs GI referral for chronic abdominal pain and lipase elevation, normal CT ABD/Pelvis  I put referral in for the patient,  He needs to be added on my schedule for Wednesday this week

## 2019-08-16 MED FILL — PANTOPRAZOLE SOD DR 20 MG T: 20 | 30 days supply | Qty: 30 | Fill #0

## 2019-08-16 MED FILL — LOSARTAN-HCTZ 100-12.5 MG T: 100-12.5 | 30 days supply | Qty: 30 | Fill #5

## 2019-08-17 ENCOUNTER — Encounter: Payer: Self-pay | Admitting: Critical Care Medicine

## 2019-08-17 ENCOUNTER — Encounter (HOSPITAL_COMMUNITY): Payer: Self-pay | Admitting: Internal Medicine

## 2019-08-17 ENCOUNTER — Observation Stay (HOSPITAL_COMMUNITY)
Admission: AD | Admit: 2019-08-17 | Discharge: 2019-08-18 | Disposition: A | Discharge status: Home / Self Care | Payer: Self-pay | Source: Ambulatory Visit | Attending: Gastroenterology | Admitting: Gastroenterology

## 2019-08-17 ENCOUNTER — Other Ambulatory Visit: Payer: Self-pay

## 2019-08-17 ENCOUNTER — Ambulatory Visit: Payer: Self-pay | Attending: Critical Care Medicine | Admitting: Critical Care Medicine

## 2019-08-17 VITALS — BP 119/80 | HR 103 | Temp 97.1°F | Resp 18 | Ht 69.0 in | Wt 315.0 lb

## 2019-08-17 DIAGNOSIS — F329 Major depressive disorder, single episode, unspecified: Secondary | ICD-10-CM | POA: Insufficient documentation

## 2019-08-17 DIAGNOSIS — Z20822 Contact with and (suspected) exposure to covid-19: Secondary | ICD-10-CM | POA: Insufficient documentation

## 2019-08-17 DIAGNOSIS — K2970 Gastritis, unspecified, without bleeding: Secondary | ICD-10-CM | POA: Insufficient documentation

## 2019-08-17 DIAGNOSIS — F411 Generalized anxiety disorder: Secondary | ICD-10-CM | POA: Diagnosis present

## 2019-08-17 DIAGNOSIS — E1169 Type 2 diabetes mellitus with other specified complication: Secondary | ICD-10-CM | POA: Diagnosis present

## 2019-08-17 DIAGNOSIS — I951 Orthostatic hypotension: Secondary | ICD-10-CM | POA: Insufficient documentation

## 2019-08-17 DIAGNOSIS — R748 Abnormal levels of other serum enzymes: Secondary | ICD-10-CM

## 2019-08-17 DIAGNOSIS — F419 Anxiety disorder, unspecified: Secondary | ICD-10-CM | POA: Insufficient documentation

## 2019-08-17 DIAGNOSIS — Z8249 Family history of ischemic heart disease and other diseases of the circulatory system: Secondary | ICD-10-CM | POA: Insufficient documentation

## 2019-08-17 DIAGNOSIS — Z841 Family history of disorders of kidney and ureter: Secondary | ICD-10-CM | POA: Insufficient documentation

## 2019-08-17 DIAGNOSIS — E1142 Type 2 diabetes mellitus with diabetic polyneuropathy: Secondary | ICD-10-CM

## 2019-08-17 DIAGNOSIS — R1013 Epigastric pain: Principal | ICD-10-CM | POA: Diagnosis present

## 2019-08-17 DIAGNOSIS — Z825 Family history of asthma and other chronic lower respiratory diseases: Secondary | ICD-10-CM | POA: Insufficient documentation

## 2019-08-17 DIAGNOSIS — Z6841 Body Mass Index (BMI) 40.0 and over, adult: Secondary | ICD-10-CM | POA: Insufficient documentation

## 2019-08-17 DIAGNOSIS — Z7984 Long term (current) use of oral hypoglycemic drugs: Secondary | ICD-10-CM | POA: Insufficient documentation

## 2019-08-17 DIAGNOSIS — Z87891 Personal history of nicotine dependence: Secondary | ICD-10-CM | POA: Insufficient documentation

## 2019-08-17 DIAGNOSIS — E785 Hyperlipidemia, unspecified: Secondary | ICD-10-CM | POA: Insufficient documentation

## 2019-08-17 DIAGNOSIS — Z888 Allergy status to other drugs, medicaments and biological substances status: Secondary | ICD-10-CM | POA: Insufficient documentation

## 2019-08-17 DIAGNOSIS — R195 Other fecal abnormalities: Secondary | ICD-10-CM | POA: Diagnosis present

## 2019-08-17 DIAGNOSIS — Z833 Family history of diabetes mellitus: Secondary | ICD-10-CM | POA: Insufficient documentation

## 2019-08-17 DIAGNOSIS — K2100 Gastro-esophageal reflux disease with esophagitis, without bleeding: Secondary | ICD-10-CM | POA: Insufficient documentation

## 2019-08-17 DIAGNOSIS — G4733 Obstructive sleep apnea (adult) (pediatric): Secondary | ICD-10-CM | POA: Diagnosis present

## 2019-08-17 DIAGNOSIS — I10 Essential (primary) hypertension: Secondary | ICD-10-CM

## 2019-08-17 DIAGNOSIS — Z8349 Family history of other endocrine, nutritional and metabolic diseases: Secondary | ICD-10-CM | POA: Insufficient documentation

## 2019-08-17 DIAGNOSIS — Z79899 Other long term (current) drug therapy: Secondary | ICD-10-CM | POA: Insufficient documentation

## 2019-08-17 DIAGNOSIS — G8929 Other chronic pain: Secondary | ICD-10-CM | POA: Insufficient documentation

## 2019-08-17 DIAGNOSIS — Z720 Tobacco use: Secondary | ICD-10-CM | POA: Diagnosis present

## 2019-08-17 DIAGNOSIS — K3189 Other diseases of stomach and duodenum: Secondary | ICD-10-CM | POA: Insufficient documentation

## 2019-08-17 DIAGNOSIS — J449 Chronic obstructive pulmonary disease, unspecified: Secondary | ICD-10-CM | POA: Insufficient documentation

## 2019-08-17 DIAGNOSIS — R0789 Other chest pain: Secondary | ICD-10-CM | POA: Diagnosis present

## 2019-08-17 LAB — COMPREHENSIVE METABOLIC PANEL
ALT: 36 U/L (ref 0–44)
AST: 28 U/L (ref 15–41)
Albumin: 4 g/dL (ref 3.5–5.0)
Alkaline Phosphatase: 56 U/L (ref 38–126)
Anion gap: 13 (ref 5–15)
BUN: 21 mg/dL — ABNORMAL HIGH (ref 6–20)
CO2: 25 mmol/L (ref 22–32)
Calcium: 9 mg/dL (ref 8.9–10.3)
Chloride: 98 mmol/L (ref 98–111)
Creatinine, Ser: 0.87 mg/dL (ref 0.61–1.24)
GFR calc Af Amer: >60 mL/min (ref >60)
GFR calc non Af Amer: >60 mL/min (ref >60)
Glucose, Bld: 129 mg/dL — ABNORMAL HIGH (ref 70–99)
Potassium: 3.5 mmol/L (ref 3.5–5.1)
Sodium: 136 mmol/L (ref 135–145)
Total Bilirubin: 0.6 mg/dL (ref 0.3–1.2)
Total Protein: 7.8 g/dL (ref 6.5–8.1)

## 2019-08-17 LAB — CBC
HCT: 43.5 % (ref 39.0–52.0)
Hemoglobin: 14.4 g/dL (ref 13.0–17.0)
MCH: 27.6 pg (ref 26.0–34.0)
MCHC: 33.1 g/dL (ref 30.0–36.0)
MCV: 83.5 fL (ref 80.0–100.0)
Platelets: 293 10*3/uL (ref 150–400)
RBC: 5.21 MIL/uL (ref 4.22–5.81)
RDW: 13.2 % (ref 11.5–15.5)
WBC: 11.2 10*3/uL — ABNORMAL HIGH (ref 4.0–10.5)
nRBC: 0 % (ref 0.0–0.2)

## 2019-08-17 LAB — RESPIRATORY PANEL BY RT PCR (FLU A&B, COVID)
Influenza A by PCR: NEGATIVE
Influenza B by PCR: NEGATIVE
SARS Coronavirus 2 by RT PCR: NEGATIVE

## 2019-08-17 LAB — TYPE AND SCREEN
ABO/RH(D): A POS
Antibody Screen: NEGATIVE

## 2019-08-17 LAB — GLUCOSE, POCT (MANUAL RESULT ENTRY): POC Glucose: 160 mg/dl — AB (ref 70–99)

## 2019-08-17 LAB — LIPASE, BLOOD: Lipase: 30 U/L (ref 11–51)

## 2019-08-17 LAB — GLUCOSE, CAPILLARY
Glucose-Capillary: 126 mg/dL — ABNORMAL HIGH (ref 70–99)
Glucose-Capillary: 116 mg/dL — ABNORMAL HIGH (ref 70–99)
Glucose-Capillary: 123 mg/dL — ABNORMAL HIGH (ref 70–99)

## 2019-08-17 LAB — ABO/RH: ABO/RH(D): A POS

## 2019-08-17 LAB — HEMOGLOBIN A1C
Hgb A1c MFr Bld: 6.8 % — ABNORMAL HIGH (ref 4.8–5.6)
Mean Plasma Glucose: 148.46 mg/dL

## 2019-08-17 LAB — TROPONIN I (HIGH SENSITIVITY)
Troponin I (High Sensitivity): 6 ng/L (ref <18)
Troponin I (High Sensitivity): 5 ng/L (ref <18)

## 2019-08-17 LAB — HIV ANTIBODY (ROUTINE TESTING W REFLEX): HIV Screen 4th Generation wRfx: NONREACTIVE

## 2019-08-17 MED ORDER — ALBUTEROL SULFATE (2.5 MG/3ML) 0.083% IN NEBU: 2.5000 mg | INHALATION_SOLUTION | Freq: Four times a day (QID) | RESPIRATORY_TRACT | Status: DC | PRN

## 2019-08-17 MED ORDER — SERTRALINE HCL 50 MG PO TABS
150.0000 mg | ORAL_TABLET | Freq: Every day | ORAL | Status: DC
  Administered 2019-08-18: 150 mg via ORAL
  Filled 2019-08-17: qty 1

## 2019-08-17 MED ORDER — INSULIN ASPART 100 UNIT/ML ~~LOC~~ SOLN: 0.0000 [IU] | Freq: Every day | SUBCUTANEOUS | Status: DC

## 2019-08-17 MED ORDER — INSULIN ASPART 100 UNIT/ML ~~LOC~~ SOLN
0.0000 [IU] | Freq: Three times a day (TID) | SUBCUTANEOUS | Status: DC
  Administered 2019-08-18 (×2): 1 [IU] via SUBCUTANEOUS

## 2019-08-17 MED ORDER — SODIUM CHLORIDE 0.9 % IV SOLN
INTRAVENOUS | Status: AC
Start: 2019-08-17 — End: 2019-08-18

## 2019-08-17 MED ORDER — ACETAMINOPHEN 650 MG RE SUPP: 650.0000 mg | Freq: Four times a day (QID) | RECTAL | Status: DC | PRN

## 2019-08-17 MED ORDER — ALBUTEROL SULFATE (2.5 MG/3ML) 0.083% IN NEBU: 2.5000 mg | INHALATION_SOLUTION | RESPIRATORY_TRACT | Status: DC | PRN

## 2019-08-17 MED ORDER — ONDANSETRON HCL 4 MG/2ML IJ SOLN: 4.0000 mg | Freq: Four times a day (QID) | INTRAMUSCULAR | Status: DC | PRN

## 2019-08-17 MED ORDER — BOOST / RESOURCE BREEZE PO LIQD CUSTOM
1.0000 | Freq: Three times a day (TID) | ORAL | Status: DC
  Administered 2019-08-17 (×2): 1 via ORAL

## 2019-08-17 MED ORDER — PANTOPRAZOLE SODIUM 40 MG PO TBEC
40.0000 mg | DELAYED_RELEASE_TABLET | Freq: Every day | ORAL | Status: DC
  Administered 2019-08-17 – 2019-08-18 (×2): 40 mg via ORAL
  Filled 2019-08-17 (×2): qty 1

## 2019-08-17 MED ORDER — SODIUM CHLORIDE 0.9% FLUSH: 3.0000 mL | Freq: Two times a day (BID) | INTRAVENOUS | Status: DC

## 2019-08-17 MED ORDER — ONDANSETRON HCL 4 MG PO TABS: 4.0000 mg | ORAL_TABLET | Freq: Four times a day (QID) | ORAL | Status: DC | PRN

## 2019-08-17 MED ORDER — ATORVASTATIN CALCIUM 40 MG PO TABS
40.0000 mg | ORAL_TABLET | Freq: Every day | ORAL | Status: DC
Start: 2019-08-18 — End: 2019-08-18
  Administered 2019-08-18: 40 mg via ORAL
  Filled 2019-08-17: qty 1

## 2019-08-17 MED ORDER — ACETAMINOPHEN 325 MG PO TABS: 650.0000 mg | ORAL_TABLET | Freq: Four times a day (QID) | ORAL | Status: DC | PRN

## 2019-08-17 NOTE — Patient Instructions (Signed)
Go to the hospital for admission [facility]

## 2019-08-17 NOTE — Assessment & Plan Note (Signed)
Heme positive stools with recent hemoglobin being preserved still concerned about gastritis or ulceration in the stomach and needs more emergent gastrointestinal evaluation that can be performed as an outpatient

## 2019-08-17 NOTE — Assessment & Plan Note (Signed)
Hypertension previously not well controlled now back on medication with adequate blood pressure

## 2019-08-17 NOTE — Progress Notes (Signed)
Pt was seen in the Hospital yesterday referred to [facility] to f /u on GI symptoms  C /o nausea, weakness, stated "pressure in the head",

## 2019-08-17 NOTE — Assessment & Plan Note (Signed)
Type 2 diabetes with associated elevations in glucoses off Metformin  May need insulin short-term until we can elucidate the patient's gastrointestinal issues we followed up while in the hospital

## 2019-08-17 NOTE — Assessment & Plan Note (Signed)
Epigastric pain with periodic elevations in lipase with normal CT scan of abdomen but heme positive stools  I am quite concerned about gastric ulcer or duodenal ulcer in this situation and wonder if in a more emergent gastroenterology evaluation is warranted  I called the hospitalist on-call and they agree to admit the patient to [facility] hospital telemetry for further inpatient evaluation of both cardiac and gastrointestinal issues  The patient is being driven by his mother to the hospital and I believe this is a safe transport

## 2019-08-17 NOTE — H&P (View-Only) (Signed)
 Referring Provider: No ref. provider found Primary Care Physician:  Johnson, Deborah B, MD Primary Gastroenterologist:  Unassigned, schedule to see Amy Esterwood PA-C in office on 08/18/2019  Reason for Consultation:  Epigastrici pain and dark stools   HPI: Juan Kline is a 48 y.o. male with a past medical history of hypertension, hyperlipidemia, DM II, neuropathy and sleep apnea on Cpap.   [facility]2019 with diarrhea for about 1 week and feeling lightheaded.  Laboratory  studies indicated mild leukocytosis with WBC 11.1. Hg 14.7.  He received IV fluids. He was unable to provide a stool sample for testing while in the ED. He was discharged home and he was prescribed Cipro 500mg po bid x 5 days which he stated he did not take.   He developed a lump in his throat and elevated blood pressure of 173/123 with associated heaviness in his chest while at home on 08/13/2019, He was concerned about his blood pressure so he went to the fire station where an ambulance transported him to [facility] ED. A 12 lead EKG was unchanged from prior tracing, no evidence of acute ischemia.  His troponin level x 2 was normal.  A chest x-ray was unremarkable.  His blood pressure improved and he was discharged home with instructions to follow-up with his primary care physician and to consider a cardiology evaluation.  He presented to [facility] emergency room on 08/14/2019 with complaints of epigastric pain and black loose stools x 2.  He noted having intermittent chest discomfort as well.  FOBT was positive.  Hg 14.2. Elevated lipase 432 with normal LFTs. CTAP did not show any evidence of pancreatitis, no acute abnormality to he abdomen or pelvis. He received IV Protonix.  Repeat EKG without acute changes. He was discharged home on Famotidine 20mg QD and he was scheduled in our office on 08/18/2019 to see Amy Esterwood PA-C for further GI evaluation.  He continued to  feel light headed with intermittent epigastric pain. No further black stool. He went to [facility] ED on 08/15/2019. Repeat lipase level was 37. Hg 14.8. A rectal exam done by the ER physician documented light brown stool guaiac positive.  He received PPI IV and he was discharged home on Pantoprazole 20mg daily.   He continued to feel light headed, feeling flushed and fatigued so he contacted his PCP who advised the patient to be admitted directly to [facility] for further evaluation. The patient, his family and Dr. Hongalgi requested a GI consult during this hospital admission.  He denies having any dysphagia or heartburn. He complains of having intermittent epigastric pain for the past 20 years. No history of stomach ulcers. He never had an EGD and he was never seen by a gastroenterologist. Sunday 5/2 was the first time he ever saw black loose stool. He does not take Pepto bismal or iron. His epigastric pain has been more frequent for the past 1 to 2 weeks. No associated N/V. Eating reduces his epigastric pain. No lower abdominal pain. His diarrhea has abated.  He passed a normal brown formed stool earlier this morning. No rectal bleeding. Decreased appetite for the past month. He has lost 25lbs over the past 4 weeks. No fever, sweats or chills. No current CP or SOB. No NSAID use. No alcohol use. No family history of esophageal, gastric or colon cancer.   ED course 08/14/2019: Sodium 137.  Potassium 3.8.  CO2 26.  Glucose 130.    BUN 21.  Creatinine 0.95.  Anion gap 12.  Alk phos 52.  Albumin 4.0.  Lipase 432.  AST 27.  ALT 20.  Total bili 0.6.  WBC 9.4.  Hemoglobin 14.2.  Hematocrit 42.6.   MCV 82.6.  Platelet 277.  Laboratory studies 08/15/2019: Sodium 136.  Potassium 3.6.  Glucose 184.  BUN 23.  Creatinine 0.86.  Alk phos 56.  Albumin 4.1.  Lipase 37.  AST 34.  ALT 33.  Total bili 0.3.  WBC 10.9.  Hemoglobin 14.8.  Hematocrit 43.7.  MCV 81.7.  Platelet 316.  Laboratory studied 08/17/2019: WBC 11.2. Hg  14.4. HCT 43.5. Influenza A and B negative. Sars Corona virus 2 negative.   Abdominal/pelvic CT 08/14/2019: 1. No acute abnormality of the abdomen or pelvis. 2. Fat containing ventral abdominal hernia. 3. Aortic Atherosclerosis    Past Medical History:  Diagnosis Date  . Chronic foot ulcer (HCC) admitted 03/02/2014   medial border first metatarsal left foot  . Diabetes (HCC)   . Diabetic foot infection (HCC)   . GAD (generalized anxiety disorder)   . Hyperlipidemia   . Hypertension Dx 2009  . Mild depression (HCC)   . OSA (obstructive sleep apnea)    CPAP  . Osteomyelitis left first metatarsal 03/02/2014  . Sleep apnea   . Venous stasis ulcer of left lower extremity (HCC)    /notes 03/02/2014    Past Surgical History:  Procedure Laterality Date  . NO PAST SURGERIES      Prior to Admission medications   Medication Sig Start Date End Date Taking? Authorizing Provider  albuterol (VENTOLIN HFA) 108 (90 Base) MCG/ACT inhaler Inhale 2 puffs into the lungs every 6 (six) hours as needed for wheezing or shortness of breath. 02/14/19   Clark, Laura P, DO  amLODipine (NORVASC) 10 MG tablet Take 1 tablet (10 mg total) by mouth daily. 03/03/19   Johnson, Deborah B, MD  atorvastatin (LIPITOR) 40 MG tablet Take 1 tablet (40 mg total) by mouth daily. 03/06/19   Johnson, Deborah B, MD  Blood Glucose Monitoring Suppl (TRUE METRIX METER) w/Device KIT Use as directed 04/13/18   Johnson, Deborah B, MD  dicyclomine (BENTYL) 20 MG tablet Take 1 tablet (20 mg total) by mouth 3 (three) times daily before meals. Patient not taking: Reported on 08/17/2019 08/14/19   Pollina, Christopher J, MD  diphenoxylate-atropine (LOMOTIL) 2.5-0.025 MG tablet Take 1 tablet by mouth 4 (four) times daily as needed for diarrhea or loose stools. Patient not taking: Reported on 08/17/2019 08/04/19   Wright, Patrick E, MD  famotidine (PEPCID) 20 MG tablet Take 1 tablet (20 mg total) by mouth 2 (two) times daily. 08/14/19   Pollina,  Christopher J, MD  glucose blood (TRUE METRIX BLOOD GLUCOSE TEST) test strip Use as instructed 04/13/18   Johnson, Deborah B, MD  losartan-hydrochlorothiazide (HYZAAR) 100-12.5 MG tablet HOLD FOR NOW 08/10/19   Wright, Patrick E, MD  metFORMIN (GLUCOPHAGE) 500 MG tablet HOLD FOR NOW Patient not taking: Reported on 08/13/2019 08/10/19   Wright, Patrick E, MD  pantoprazole (PROTONIX) 20 MG tablet Take 1 tablet (20 mg total) by mouth daily. 08/15/19 09/14/19  Venter, Margaux, PA-C  sertraline (ZOLOFT) 100 MG tablet Take 1.5 tablets (150 mg total) by mouth daily. 06/30/19 12/27/19  Pucilowski, Olgierd A, MD  tiotropium (SPIRIVA) 18 MCG inhalation capsule Place 1 capsule (18 mcg total) into inhaler and inhale daily. 02/14/19   Clark, Laura P, DO  TRUEPLUS LANCETS 28G MISC Use as directed 04/13/18     Johnson, Deborah B, MD    No current facility-administered medications for this encounter.    Allergies as of 08/17/2019 - Review Complete 08/17/2019  Allergen Reaction Noted  . Lyrica [pregabalin] Anxiety 08/12/2016    Family History  Problem Relation Age of Onset  . Hypertension Mother   . Hyperlipidemia Mother   . Diabetes Mother   . Kidney disease Mother   . Hypertension Father   . Hyperlipidemia Father   . Diabetes Brother   . Hypertension Brother   . Hyperlipidemia Brother   . Heart disease Maternal Grandmother   . COPD Maternal Grandmother   . Diabetes Maternal Grandfather   . Heart attack Paternal Grandmother   . Heart disease Paternal Grandfather     Social History   Socioeconomic History  . Marital status: Single    Spouse name: Not on file  . Number of children: 0  . Years of education: Not on file  . Highest education level: Not on file  Occupational History  . Not on file  Tobacco Use  . Smoking status: Former Smoker    Packs/day: 1.50    Years: 26.00    Pack years: 39.00    Types: E-cigarettes, Cigarettes    Start date: 1989    Quit date: 02/16/2014    Years since  quitting: 5.5  . Smokeless tobacco: Never Used  Substance and Sexual Activity  . Alcohol use: Yes    Comment: 03/02/2014 "I rarely drink"  . Drug use: No  . Sexual activity: Not Currently  Other Topics Concern  . Not on file  Social History Narrative   Pt lives in 1 story home with his mother and step father   9th grade education   Last occupation was with [employer] in 2009      Unemployed at this time- thinking of filing for disability      Right handed      Social Determinants of Health   Financial Resource Strain:   . Difficulty of Paying Living Expenses:   Food Insecurity:   . Worried About Running Out of Food in the Last Year:   . Ran Out of Food in the Last Year:   Transportation Needs:   . Lack of Transportation (Medical):   . Lack of Transportation (Non-Medical):   Physical Activity:   . Days of Exercise per Week:   . Minutes of Exercise per Session:   Stress:   . Feeling of Stress :   Social Connections:   . Frequency of Communication with Friends and Family:   . Frequency of Social Gatherings with Friends and Family:   . Attends Religious Services:   . Active Member of Clubs or Organizations:   . Attends Club or Organization Meetings:   . Marital Status:   Intimate Partner Violence:   . Fear of Current or Ex-Partner:   . Emotionally Abused:   . Physically Abused:   . Sexually Abused:     Review of Systems: See HPI. All other systems reviewed and are negative.  Physical Exam: Vital signs in last 24 hours: Temp:  [97.1 F (36.2 C)-98 F (36.7 C)] 98 F (36.7 C) (05/05 1153) Pulse Rate:  [84-103] 84 (05/05 1153) Resp:  [18-20] 20 (05/05 1153) BP: (119-120)/(80-83) 120/83 (05/05 1153) SpO2:  [95 %] 95 % (05/05 1119) Weight:  [142.9 kg] 142.9 kg (05/05 1153) Last BM Date: 08/17/19 General:  Alert, well-developed, well-nourished, pleasant and cooperative in NAD. Head:  Normocephalic and atraumatic. Eyes:  No   scleral icterus. Conjunctiva pink. Ears:   Normal auditory acuity. Nose:  No deformity, discharge or lesions. Mouth: Dentition intact. No ulcers or lesions.  Neck:  Supple. No lymphadenopathy or thyromegaly.  Lungs: Breath sounds clear throughout.   Heart:  RRR, no murmurs.  Abdomen:  Obese abdomen. Soft, nontender, nondistended. + BS x 4 quads. Umbilical hernia with firm component to the umbilicus, nontender. Positive bowel sounds x 4 quads. No HSM. Rectal: Deferred. Musculoskeletal:  Symmetrical without gross deformities.  Pulses:  Normal pulses noted. Extremities:  Bilateral LEs with venous stasis, 1 + edema bilaterally.  Neurologic:  Alert and  oriented x4. No focal deficits.  Skin:  Intact without significant lesions or rashes. Psych:  Alert and cooperative. Normal mood and affect.  Intake/Output from previous day: No intake/output data recorded. Intake/Output this shift: No intake/output data recorded.  Lab Results: Recent Labs    08/15/19 1754  WBC 10.9*  HGB 14.8  HCT 43.7  PLT 316   BMET Recent Labs    08/15/19 1754  NA 136  K 3.6  CL 98  CO2 23  GLUCOSE 184*  BUN 23*  CREATININE 0.86  CALCIUM 9.5   LFT Recent Labs    08/15/19 1754  PROT 7.9  ALBUMIN 4.1  AST 34  ALT 33  ALKPHOS 56  BILITOT 0.3   PT/INR No results for input(s): LABPROT, INR in the last 72 hours. Hepatitis Panel No results for input(s): HEPBSAG, HCVAB, HEPAIGM, HEPBIGM in the last 72 hours.    Studies/Results: No results found.  IMPRESSION/PLAN:  1.  48 year old male with acute on chronic epigastric pain, melenic stool x 2 on 5/2. Stable Hg 14.4.  -Pantoprazole 40mg po QD -EGD as inpatient verses outpatient, further recommendations per Dr. Beavers.  -Repeat CBC, BMP and lipase in AM -Clear liquid diet for now  2. Diarrhea, resolved   3. DM II   4. Sleep apnea on Cpap  ADDENDUM: Patient to proceed with an EGD tomorrow 08/18/2019 at 10 AM with Dr. Beavers.  EGD benefits and risks discussed with the patient  including risk with sedation, bleeding, infection and perforation.  He wishes to proceed with the EGD as recommended.   Shuntel Fishburn M Kennedy-Smith  08/17/2019, 12:01 PM      

## 2019-08-17 NOTE — Progress Notes (Signed)
Subjective:    Patient ID: Juan Kline, male    DOB: 1971-02-18, 49 y.o.   MRN: [id number] History of Present Illness: 49 y.o.M  HTN COPD OSA T2DM This is a telemedicine video visit The patient gives a history of having a Pfizer vaccine his first dose on April 2 and he states within 2 days he began having diarrhea fatigue headaches loss of taste and smell nausea muscle aches and severe fatigue The patient has another dose of Covid vaccine due on April 23  The patient has been staying out of work at this time.  He works at [employer] and is exposed to various customers.  He has been keeping up with his fluids.  He denies chest pain.  He states his dyspnea is stable at this time.  He denies a cough.  His diarrhea though is quite excessive.   08/10/2019 The patient returns today for a face-to-face visit to evaluate the patient's abdominal pain and ongoing diarrhea  The patient's continues to have loose stools without blood in the stools.  There is nausea without vomiting.  He has abdominal pain in the epigastric area.  His Covid test did come back negative.  I saw him on a video visit yesterday but need to bring him to the office for a physical exam.  Previous history of morbid obesity and hypertension along with sleep apnea and COPD mixed type with associated history of diabetes peripheral neuropathy and hyperlipidemia  08/17/2019 Patient returns today with progressive epigastric pain nausea without vomiting episodic tarry stools but decrease in diarrhea.  The patient continues to have a sense of fogginess in his thinking but no overt headaches.  He did have chest pain when his blood pressure was in the 1 7110 range the pain is improved now pain is in the anterior sternal area with radiation down the right arm.  He remains very weak and is taking in some nourishment but overall is not really improving.  He has been to the emergency room 3 times over the past 5 days.  Troponin studies and  ECGs were unremarkable but there is concern over coronary disease with his diabetes.  I held his Metformin because of his nausea and his glucoses have been in the 160 range off Metformin.  Note CT scan of the abdomen was unremarkable and lipase has been elevated but it dropped back down again with most recent ED visit.  The patient states with the Protonix that he was given from the emergency room his tarry stools are less but still has significant epigastric pain      Past Medical History:  Diagnosis Date  . Chronic foot ulcer ([place]) admitted 03/02/2014   medial border first metatarsal left foot  . Diabetes ([place])   . Diabetic foot infection ([place])   . GAD (generalized anxiety disorder)   . Hyperlipidemia   . Hypertension Dx 2009  . Mild depression ([place])   . OSA (obstructive sleep apnea)    CPAP  . Osteomyelitis left first metatarsal 03/02/2014  . Sleep apnea   . Venous stasis ulcer of left lower extremity ([place])    [name] 03/02/2014     Family History  Problem Relation Age of Onset  . Hypertension Mother   . Hyperlipidemia Mother   . Diabetes Mother   . Kidney disease Mother   . Hypertension Father   . Hyperlipidemia Father   . Diabetes Brother   . Hypertension Brother   . Hyperlipidemia Brother   .  Heart disease Maternal Grandmother   . COPD Maternal Grandmother   . Diabetes Maternal Grandfather   . Heart attack Paternal Grandmother   . Heart disease Paternal Grandfather      Social History   Socioeconomic History  . Marital status: Single    Spouse name: Not on file  . Number of children: 0  . Years of education: Not on file  . Highest education level: Not on file  Occupational History  . Not on file  Tobacco Use  . Smoking status: Former Smoker    Packs/day: 1.50    Years: 26.00    Pack years: 39.00    Types: E-cigarettes, Cigarettes    Start date: 1989    Quit date: 02/16/2014    Years since quitting: 5.5  . Smokeless tobacco: Never Used  Substance  and Sexual Activity  . Alcohol use: Yes    Comment: 03/02/2014 "I rarely drink"  . Drug use: No  . Sexual activity: Not Currently  Other Topics Concern  . Not on file  Social History Narrative   Pt lives in 1 story home with his mother and step father   9th grade education   Last occupation was with [employer] in 2009      Unemployed at this time- thinking of filing for disability      Right handed      Social Determinants of Health   Financial Resource Strain:   . Difficulty of Paying Living Expenses:   Food Insecurity:   . Worried About Charity fundraiser in the Last Year:   . Arboriculturist in the Last Year:   Transportation Needs:   . Film/video editor (Medical):   Marland Kitchen Lack of Transportation (Non-Medical):   Physical Activity:   . Days of Exercise per Week:   . Minutes of Exercise per Session:   Stress:   . Feeling of Stress :   Social Connections:   . Frequency of Communication with Friends and Family:   . Frequency of Social Gatherings with Friends and Family:   . Attends Religious Services:   . Active Member of Clubs or Organizations:   . Attends Archivist Meetings:   Marland Kitchen Marital Status:   Intimate Partner Violence:   . Fear of Current or Ex-Partner:   . Emotionally Abused:   Marland Kitchen Physically Abused:   . Sexually Abused:      Allergies  Allergen Reactions  . Lyrica [Pregabalin] Anxiety     Outpatient Medications Prior to Visit  Medication Sig Dispense Refill  . albuterol (VENTOLIN HFA) 108 (90 Base) MCG/ACT inhaler Inhale 2 puffs into the lungs every 6 (six) hours as needed for wheezing or shortness of breath. 6.7 g 11  . amLODipine (NORVASC) 10 MG tablet Take 1 tablet (10 mg total) by mouth daily. 30 tablet 6  . atorvastatin (LIPITOR) 40 MG tablet Take 1 tablet (40 mg total) by mouth daily. 90 tablet 3  . Blood Glucose Monitoring Suppl (TRUE METRIX METER) w/Device KIT Use as directed 1 kit 0  . famotidine (PEPCID) 20 MG tablet Take 1 tablet (20  mg total) by mouth 2 (two) times daily. 60 tablet 0  . glucose blood (TRUE METRIX BLOOD GLUCOSE TEST) test strip Use as instructed 100 each 12  . losartan-hydrochlorothiazide (HYZAAR) 100-12.5 MG tablet HOLD FOR NOW 90 tablet 3  . pantoprazole (PROTONIX) 20 MG tablet Take 1 tablet (20 mg total) by mouth daily. 30 tablet 0  . sertraline (  ZOLOFT) 100 MG tablet Take 1.5 tablets (150 mg total) by mouth daily. 45 tablet 5  . tiotropium (SPIRIVA) 18 MCG inhalation capsule Place 1 capsule (18 mcg total) into inhaler and inhale daily. 30 capsule 6  . TRUEPLUS LANCETS 28G MISC Use as directed 100 each 2  . dicyclomine (BENTYL) 20 MG tablet Take 1 tablet (20 mg total) by mouth 3 (three) times daily before meals. (Patient not taking: Reported on 08/17/2019) 30 tablet 0  . diphenoxylate-atropine (LOMOTIL) 2.5-0.025 MG tablet Take 1 tablet by mouth 4 (four) times daily as needed for diarrhea or loose stools. (Patient not taking: Reported on 08/17/2019) 30 tablet 0  . metFORMIN (GLUCOPHAGE) 500 MG tablet HOLD FOR NOW (Patient not taking: Reported on 08/13/2019) 60 tablet 6   No facility-administered medications prior to visit.      Review of Systems  Constitutional: Positive for diaphoresis, fatigue and fever.  HENT: Positive for congestion and sore throat.   Eyes: Negative.   Respiratory: Positive for cough and shortness of breath.   Cardiovascular: Positive for chest pain and leg swelling. Negative for palpitations.  Gastrointestinal: Positive for abdominal pain, blood in stool, constipation, diarrhea and nausea. Negative for abdominal distention, anal bleeding, rectal pain and vomiting.  Genitourinary: Negative.   Musculoskeletal: Positive for myalgias.  Skin: Negative.   Neurological: Positive for dizziness, light-headedness and headaches.  Psychiatric/Behavioral: Positive for decreased concentration and dysphoric mood. Negative for suicidal ideas.       Objective:   Physical Exam Vitals:    08/17/19 0851  BP: 119/80  Pulse: (!) 103  Resp: 18  Temp: (!) 97.1 F (36.2 C)  SpO2: 95%  Weight: (!) 315 lb (142.9 kg)  Height: 5' 9"  (1.753 m)    Gen: This is an ill-appearing obese male in no acute distress but also extremely weak  ENT: No lesions,  mouth clear,  oropharynx clear, no postnasal drip  Neck: No JVD, no TMG, no carotid bruits  Lungs: No use of accessory muscles, no dullness to percussion, clear without rales or rhonchi  Cardiovascular: RRR, heart sounds normal, no murmur or gallops, no peripheral edema  Abdomen: Abdomen is distended, there is no hepatosplenomegaly but there is pinpoint tenderness in the epigastric area without rebound or guarding,  BS normal.  Musculoskeletal: No deformities, no cyanosis or clubbing  Neuro: alert, non focal  Skin: Warm, no lesions or rashes    Assessment & Plan:  I personally reviewed all images and lab data in the [facility] system as well as any outside material available during this office visit and agree with the  radiology impressions.   history of Hypertension Hypertension previously not well controlled now back on medication with adequate blood pressure  Epigastric pain Epigastric pain with periodic elevations in lipase with normal CT scan of abdomen but heme positive stools  I am quite concerned about gastric ulcer or duodenal ulcer in this situation and wonder if in a more emergent gastroenterology evaluation is warranted  I called the hospitalist on-call and they agree to admit the patient to [facility] long hospital telemetry for further inpatient evaluation of both cardiac and gastrointestinal issues  The patient is being driven by his mother to the hospital and I believe this is a safe transport  Heme positive stool Heme positive stools with recent hemoglobin being preserved still concerned about gastritis or ulceration in the stomach and needs more emergent gastrointestinal evaluation that can be performed as an  outpatient  Type 2 diabetes mellitus with  diabetic polyneuropathy, without long-term current use of insulin (HCC) Type 2 diabetes with associated elevations in glucoses off Metformin  May need insulin short-term until we can elucidate the patient's gastrointestinal issues we followed up while in the hospital   Diagnoses and all orders for this visit:  Epigastric pain  Type 2 diabetes mellitus with diabetic polyneuropathy, without long-term current use of insulin (HCC) -     POCT glucose (manual entry)  Morbid obesity ([place])  Elevated lipase  Essential hypertension  Heme positive stool

## 2019-08-17 NOTE — H&P (Addendum)
History and Physical    Juan Kline NKN:[id number] DOB: 06/24/1970 DOA: 08/17/2019  PCP: Ladell Pier, MD   I have briefly reviewed patients previous medical reports in [facility].  Patient coming from: Home  Chief Complaint: "Feels terrible", intermittent epigastric pain and nausea, decreased appetite, significant weight loss, dizziness on standing.  HPI: Juan Kline is a 49 year old male, lives with his mother, independent, works as a Scientist, water quality, sent by his PCP after seen in the clinic this morning as a direct admission to [facility] medical floor.  He has PMH of DM2, HTN, HLD, anxiety, OSA on CPAP, morbid obesity, COPD.  He presents with several complaints many of which are nonspecific.  He indicates that his face head and face feels flushed, more so on standing and better when laying down, generally feels weak.  Denies headache.  Although reported chest pain with PCP, currently denies any chest pain, palpitations or dyspnea.  No syncope or near syncopal episodes.  Reports 3 weeks history of diarrhea in the early part of April after his first dose of Covid vaccine.  Appears to have completed 2 doses of Covid vaccine/Pfizer.  At that time he had 5-10 episodes of watery diarrhea without mucus or blood, this was self-limited and has resolved since.  Reports chronic history of intermittent epigastric pain for years with periods worsening and periods pain.  Since April gives history of slightly more epigastric pain, decreased appetite, about 20 pound weight loss over a month time, intermittent nausea, decreased oral intake.  3 days prior to admission, had 2 episodes of black tarry stools without red blood.  Seen in the ED at which time lab work were unremarkable, previously elevated lipase had normalized, CT abdomen without acute findings, he was started on oral Protonix and has follow-up appointment with [facility] on 5/6.  He has not had any further black tarry stools since that episode.   He was seen at PCPs office today, seen in ED 3 times over the past 5 days, troponins and EKGs are unremarkable.  ED Course: Patient was directly admitted to the medical floor and did not come through the ED.  Review of Systems:  All other systems reviewed and apart from HPI, are negative.  Past Medical History:  Diagnosis Date  . Chronic foot ulcer ([place]) admitted 03/02/2014   medial border first metatarsal left foot  . Diabetes ([place])   . Diabetic foot infection ([place])   . GAD (generalized anxiety disorder)   . Hyperlipidemia   . Hypertension Dx 2009  . Mild depression ([place])   . OSA (obstructive sleep apnea)    CPAP  . Osteomyelitis left first metatarsal 03/02/2014  . Sleep apnea   . Venous stasis ulcer of left lower extremity ([place])    [name] 03/02/2014    Past Surgical History:  Procedure Laterality Date  . NO PAST SURGERIES      Social History  reports that he quit smoking about 5 years ago. His smoking use included e-cigarettes and cigarettes. He started smoking about 32 years ago. He has a 39.00 pack-year smoking history. He has never used smokeless tobacco. He reports current alcohol use. He reports that he does not use drugs.  Allergies  Allergen Reactions  . Lyrica [Pregabalin] Anxiety    Family History  Problem Relation Age of Onset  . Hypertension Mother   . Hyperlipidemia Mother   . Diabetes Mother   . Kidney disease Mother   . Hypertension Father   .  Hyperlipidemia Father   . Diabetes Brother   . Hypertension Brother   . Hyperlipidemia Brother   . Heart disease Maternal Grandmother   . COPD Maternal Grandmother   . Diabetes Maternal Grandfather   . Heart attack Paternal Grandmother   . Heart disease Paternal Grandfather      Prior to Admission medications   Medication Sig Start Date End Date Taking? Authorizing Provider  albuterol (VENTOLIN HFA) 108 (90 Base) MCG/ACT inhaler Inhale 2 puffs into the lungs every 6 (six) hours as needed for wheezing  or shortness of breath. 02/14/19   Noemi Chapel P, DO  amLODipine (NORVASC) 10 MG tablet Take 1 tablet (10 mg total) by mouth daily. 03/03/19   Ladell Pier, MD  atorvastatin (LIPITOR) 40 MG tablet Take 1 tablet (40 mg total) by mouth daily. 03/06/19   Ladell Pier, MD  Blood Glucose Monitoring Suppl (TRUE METRIX METER) w/Device KIT Use as directed 04/13/18   Ladell Pier, MD  dicyclomine (BENTYL) 20 MG tablet Take 1 tablet (20 mg total) by mouth 3 (three) times daily before meals. Patient not taking: Reported on 08/17/2019 08/14/19   Orpah Greek, MD  diphenoxylate-atropine (LOMOTIL) 2.5-0.025 MG tablet Take 1 tablet by mouth 4 (four) times daily as needed for diarrhea or loose stools. Patient not taking: Reported on 08/17/2019 08/04/19   Elsie Stain, MD  famotidine (PEPCID) 20 MG tablet Take 1 tablet (20 mg total) by mouth 2 (two) times daily. 08/14/19   Orpah Greek, MD  glucose blood (TRUE METRIX BLOOD GLUCOSE TEST) test strip Use as instructed 04/13/18   Ladell Pier, MD  losartan-hydrochlorothiazide [facility]) 100-12.5 MG tablet HOLD FOR NOW 08/10/19   Elsie Stain, MD  metFORMIN (GLUCOPHAGE) 500 MG tablet HOLD FOR NOW Patient not taking: Reported on 08/13/2019 08/10/19   Elsie Stain, MD  pantoprazole (PROTONIX) 20 MG tablet Take 1 tablet (20 mg total) by mouth daily. 08/15/19 09/14/19  Eustaquio Maize, PA-C  sertraline (ZOLOFT) 100 MG tablet Take 1.5 tablets (150 mg total) by mouth daily. 06/30/19 12/27/19  Pucilowski, Marchia Bond, MD  tiotropium (SPIRIVA) 18 MCG inhalation capsule Place 1 capsule (18 mcg total) into inhaler and inhale daily. 02/14/19   Julian Hy, DO  TRUEPLUS LANCETS 28G MISC Use as directed 04/13/18   Ladell Pier, MD    Physical Exam: Vitals:   08/17/19 1119 08/17/19 1153  BP: 120/83 120/83  Pulse: 84 84  Resp: 20 20  Temp: 98 F (36.7 C) 98 F (36.7 C)  TempSrc: Oral Oral  SpO2: 95%   Weight:  (!) 142.9 kg    Height:  5' 9"  (1.753 m)      Constitutional: Young male, moderately built and morbidly obese, lying comfortably propped up in bed without distress. Eyes: PERTLA, lids and conjunctivae normal ENMT: Mucous membranes are dry. Posterior pharynx clear of any exudate or lesions. Normal dentition.  Neck: supple, no masses, no thyromegaly Respiratory: Clear to auscultation without wheezing, rhonchi or crackles. No increased work of breathing. Cardiovascular: S1 & S2 heard, regular rate and rhythm. No JVD, murmurs, rubs or clicks. No pedal edema. Abdomen: Non distended. Non tender. Soft. No organomegaly or masses appreciated. No clinical Ascites. Normal bowel sounds heard. Musculoskeletal: no clubbing / cyanosis. No joint deformity upper and lower extremities. Good ROM, no contractures. Normal muscle tone.  Skin: no rashes, lesions, ulcers. No induration Neurologic: CN 2-12 grossly intact. Sensation intact, DTR normal. Strength 5/5 in all 4  limbs.  Psychiatric: Normal judgment and insight. Alert and oriented x 3. Normal mood.     Labs on Admission: I have personally reviewed following labs and imaging studies  CBC: Recent Labs  Lab 08/13/19 0128 08/14/19 0141 08/15/19 1754  WBC 7.8 9.4 10.9*  NEUTROABS  --  6.0  --   HGB 13.9 14.2 14.8  HCT 43.1 42.6 43.7  MCV 84.8 82.6 81.7  PLT 280 277 161    Basic Metabolic Panel: Recent Labs  Lab 08/13/19 0128 08/14/19 0141 08/15/19 1754  NA 138 137 136  K 3.6 3.8 3.6  CL 98 99 98  CO2 25 26 23   GLUCOSE 142* 130* 184*  BUN 23* 21* 23*  CREATININE 0.91 0.95 0.86  CALCIUM 9.4 9.0 9.5    Liver Function Tests: Recent Labs  Lab 08/14/19 0141 08/15/19 1754  AST 27 34  ALT 28 33  ALKPHOS 52 56  BILITOT 0.6 0.3  PROT 7.8 7.9  ALBUMIN 4.0 4.1    Urine analysis:    Component Value Date/Time   COLORURINE YELLOW 08/15/2019 [place] 08/15/2019 1755   LABSPEC 1.025 08/15/2019 1755   PHURINE 6.0 08/15/2019 1755    GLUCOSEU NEGATIVE 08/15/2019 1755   HGBUR TRACE (A) 08/15/2019 [place] 08/15/2019 1755   BILIRUBINUR negative 05/26/2019 [place] 08/15/2019 1755   PROTEINUR 100 (A) 08/15/2019 1755   UROBILINOGEN 0.2 05/26/2019 1116   NITRITE NEGATIVE 08/15/2019 1755   LEUKOCYTESUR NEGATIVE 08/15/2019 1755     Radiological Exams on Admission: No results found.  EKG: Has been requested.  Assessment/Plan Principal Problem:   Orthostatic hypotension Active Problems:   history of Hypertension   Morbid obesity (HCC)   GAD (generalized anxiety disorder)   OSA (obstructive sleep apnea)   Vapes nicotine containing substance   Hyperlipidemia associated with type 2 diabetes mellitus (HCC)   Epigastric pain   Heme positive stool     Orthostatic hypotension/essential hypertension: This was signed out to [facility] MD who accepted transfer of this patient from PCP.  Check orthostatics again.  IV fluids.  Review home antihypertensives after pharmacy has reviewed and consider holding amlodipine and/or other antihypertensives.  Epigastric pain/FOBT +: Intermittent elevation of lipase, normalized on 5/3.  CT abdomen on 5/3 without acute findings.  FOBT recently positive in ED on 5/3.  No history of regular NSAID intake or alcohol use.  Prolonged history of intermittent epigastric pain.  Episode of black tarry stools a couple days prior to admission.  Some concern for UGIB but does not seem significant given stability of hemoglobin.  Follow CBC.  Continue Protonix.  Had GI appointment for tomorrow, consulted [facility] to see in the hospital.  Currently placed on clear liquid diet.  PCP was concerned regarding gastric or duodenal ulcer and need for urgent GI evaluation.  Atypical chest pain: Reported during visit with PCP, none at this time.  Review repeat EKG, trend troponins.  May be GI in origin.  Given multiple risk factors for CAD, if has ongoing chest pain and/or EKG/troponins  turn positive, consider cardiology consultation.  Type II DM with diabetic polyneuropathy: Hold Metformin.  SSI while hospitalized.  Essential hypertension: Judicious use of some antihypertensives and consider holding amlodipine given orthostatic hypotension.  OSA on CPAP: Continue CPAP.  Morbid obesity: Lifestyle modifications.  Elevated lipase: Unclear etiology.  GI consulted.  Addendum: EKG personally reviewed, sinus rhythm at 80 bpm, LAD, no acute changes and QTC 449  ms.  Troponin x2: neg [facility] follow-up appreciated and plan for EGD 08/18/2019.   DVT prophylaxis: SCDs Code Status: Full Family Communication: Discussed in detail with patient's mother at bedside, updated care and answered questions. Disposition Plan:   Patient is from:  Home  Anticipated DC to:  Home  Anticipated DC date:  08/18/2019  Anticipated DC barriers: Orthostatic hypotension, epigastric and atypical chest pain that requires further evaluation.   Consults called: [facility] Admission status: Observation, telemetry bed  Severity of Illness: The appropriate patient status for this patient is OBSERVATION. Observation status is judged to be reasonable and necessary in order to provide the required intensity of service to ensure the patient's safety. The patient's presenting symptoms, physical exam findings, and initial radiographic and laboratory data in the context of their medical condition is felt to place them at decreased risk for further clinical deterioration. Furthermore, it is anticipated that the patient will be medically stable for discharge from the hospital within 2 midnights of admission. The following factors support the patient status of observation.   " The patient's presenting symptoms include multiple complaints as noted above, feeling terrible, epigastric pain, orthostatic dizziness. " The physical exam findings include morbidly obese, no abdominal tenderness or acute findings. " The initial  radiographic and laboratory data are labs requested and pending.      Vernell Leep MD [facility]  To contact the attending provider between 7A-7P or the covering provider during after hours 7P-7A, please log into the web site [email or web address] and access using universal [place] password for that web site. If you do not have the password, please call the hospital operator.  08/17/2019, 12:06 PM

## 2019-08-17 NOTE — Consult Note (Addendum)
Referring Provider: No ref. provider found Primary Care Physician:  Ladell Pier, MD Primary Gastroenterologist:  Althia Forts, schedule to see Nicoletta Ba PA-C in office on 08/18/2019  Reason for Consultation:  Epigastrici pain and dark stools   HPI: Juan Kline is a 49 y.o. male with a past medical history of hypertension, hyperlipidemia, DM II, neuropathy and sleep apnea on Cpap.   [facility]2019 with diarrhea for about 1 week and feeling lightheaded.  Laboratory  studies indicated mild leukocytosis with WBC 11.1. Hg 14.7.  He received IV fluids. He was unable to provide a stool sample for testing while in the ED. He was discharged home and he was prescribed Cipro 53m po bid x 5 days which he stated he did not take.   He developed a lump in his throat and elevated blood pressure of 173/123 with associated heaviness in his chest while at home on 08/13/2019, He was concerned about his blood pressure so he went to the fire station [facility] A 12 lead EKG was unchanged from prior tracing, no evidence of acute ischemia.  His troponin level x 2 was normal.  A chest x-ray was unremarkable.  His blood pressure improved and he was discharged home with instructions to follow-up with his primary care physician and to consider a cardiology evaluation.  [facility] with complaints of epigastric pain and black loose stools x 2.  He noted having intermittent chest discomfort as well.  FOBT was positive.  Hg 14.2. Elevated lipase 432 with normal LFTs. CTAP did not show any evidence of pancreatitis, no acute abnormality to he abdomen or pelvis. He received IV Protonix.  Repeat EKG without acute changes. He was discharged home on Famotidine 227mQD and he was scheduled in our office [facility]  He continued to  feel light headed with intermittent epigastric pain. No further black stool. [facility]. Repeat lipase level was 37. Hg 14.8. A rectal exam done by the ER physician documented light brown stool guaiac positive.  He received PPI IV and he was discharged home on Pantoprazole 2025maily.   He continued to feel light headed, feeling flushed and fatigued so he contacted his PCP who [facility]. The patient, his family and Dr. HonAlgis Limingquested a GI consult during this hospital admission.  He denies having any dysphagia or heartburn. He complains of having intermittent epigastric pain for the past 20 years. No history of stomach ulcers. He never had an EGD and he was never seen by a gastroenterologist. Sunday 5/2 was the first time he ever saw black loose stool. He does not take Pepto bismal or iron. His epigastric pain has been more frequent for the past 1 to 2 weeks. No associated N/V. Eating reduces his epigastric pain. No lower abdominal pain. His diarrhea has abated.  He passed a normal brown formed stool earlier this morning. No rectal bleeding. Decreased appetite for the past month. He has lost 25lbs over the past 4 weeks. No fever, sweats or chills. No current CP or SOB. No NSAID use. No alcohol use. No family history of esophageal, gastric or colon cancer.   ED course 08/14/2019: Sodium 137.  Potassium 3.8.  CO2 26.  Glucose 130.  BUN 21.  Creatinine 0.95.  Anion gap 12.  Alk phos 52.  Albumin 4.0.  Lipase 432.  AST 27.  ALT 20.  Total bili 0.6.  WBC 9.4.  Hemoglobin 14.2.  Hematocrit 42.6.   MCV 82.6.  Platelet 277.  Laboratory studies 08/15/2019: Sodium 136.  Potassium 3.6.  Glucose 184.  BUN 23.  Creatinine 0.86.  Alk phos 56.  Albumin 4.1.  Lipase 37.  AST 34.  ALT 33.  Total bili 0.3.  WBC 10.9.  Hemoglobin 14.8.  Hematocrit 43.7.  MCV 81.7.  Platelet 316.  Laboratory studied 08/17/2019: WBC 11.2. Hg  14.4. HCT 43.5. Influenza A and B negative. [employer] virus 2 negative.   Abdominal/pelvic CT 08/14/2019: 1. No acute abnormality of the abdomen or pelvis. 2. Fat containing ventral abdominal hernia. 3. Aortic Atherosclerosis    Past Medical History:  Diagnosis Date  . Chronic foot ulcer ([place]) admitted 03/02/2014   medial border first metatarsal left foot  . Diabetes ([place])   . Diabetic foot infection ([place])   . GAD (generalized anxiety disorder)   . Hyperlipidemia   . Hypertension Dx 2009  . Mild depression ([place])   . OSA (obstructive sleep apnea)    CPAP  . Osteomyelitis left first metatarsal 03/02/2014  . Sleep apnea   . Venous stasis ulcer of left lower extremity ([place])    [name] 03/02/2014    Past Surgical History:  Procedure Laterality Date  . NO PAST SURGERIES      Prior to Admission medications   Medication Sig Start Date End Date Taking? Authorizing Provider  albuterol (VENTOLIN HFA) 108 (90 Base) MCG/ACT inhaler Inhale 2 puffs into the lungs every 6 (six) hours as needed for wheezing or shortness of breath. 02/14/19   Noemi Chapel P, DO  amLODipine (NORVASC) 10 MG tablet Take 1 tablet (10 mg total) by mouth daily. 03/03/19   Ladell Pier, MD  atorvastatin (LIPITOR) 40 MG tablet Take 1 tablet (40 mg total) by mouth daily. 03/06/19   Ladell Pier, MD  Blood Glucose Monitoring Suppl (TRUE METRIX METER) w/Device KIT Use as directed 04/13/18   Ladell Pier, MD  dicyclomine (BENTYL) 20 MG tablet Take 1 tablet (20 mg total) by mouth 3 (three) times daily before meals. Patient not taking: Reported on 08/17/2019 08/14/19   Orpah Greek, MD  diphenoxylate-atropine (LOMOTIL) 2.5-0.025 MG tablet Take 1 tablet by mouth 4 (four) times daily as needed for diarrhea or loose stools. Patient not taking: Reported on 08/17/2019 08/04/19   Elsie Stain, MD  famotidine (PEPCID) 20 MG tablet Take 1 tablet (20 mg total) by mouth 2 (two) times daily. 08/14/19   Orpah Greek, MD  glucose blood (TRUE METRIX BLOOD GLUCOSE TEST) test strip Use as instructed 04/13/18   Ladell Pier, MD  losartan-hydrochlorothiazide [facility]) 100-12.5 MG tablet HOLD FOR NOW 08/10/19   Elsie Stain, MD  metFORMIN (GLUCOPHAGE) 500 MG tablet HOLD FOR NOW Patient not taking: Reported on 08/13/2019 08/10/19   Elsie Stain, MD  pantoprazole (PROTONIX) 20 MG tablet Take 1 tablet (20 mg total) by mouth daily. 08/15/19 09/14/19  Eustaquio Maize, PA-C  sertraline (ZOLOFT) 100 MG tablet Take 1.5 tablets (150 mg total) by mouth daily. 06/30/19 12/27/19  Pucilowski, Marchia Bond, MD  tiotropium (SPIRIVA) 18 MCG inhalation capsule Place 1 capsule (18 mcg total) into inhaler and inhale daily. 02/14/19   Julian Hy, DO  TRUEPLUS LANCETS 28G MISC Use as directed 04/13/18  Ladell Pier, MD    No current facility-administered medications for this encounter.    Allergies as of 08/17/2019 - Review Complete 08/17/2019  Allergen Reaction Noted  . Lyrica [pregabalin] Anxiety 08/12/2016    Family History  Problem Relation Age of Onset  . Hypertension Mother   . Hyperlipidemia Mother   . Diabetes Mother   . Kidney disease Mother   . Hypertension Father   . Hyperlipidemia Father   . Diabetes Brother   . Hypertension Brother   . Hyperlipidemia Brother   . Heart disease Maternal Grandmother   . COPD Maternal Grandmother   . Diabetes Maternal Grandfather   . Heart attack Paternal Grandmother   . Heart disease Paternal Grandfather     Social History   Socioeconomic History  . Marital status: Single    Spouse name: Not on file  . Number of children: 0  . Years of education: Not on file  . Highest education level: Not on file  Occupational History  . Not on file  Tobacco Use  . Smoking status: Former Smoker    Packs/day: 1.50    Years: 26.00    Pack years: 39.00    Types: E-cigarettes, Cigarettes    Start date: 1989    Quit date: 02/16/2014    Years since  quitting: 5.5  . Smokeless tobacco: Never Used  Substance and Sexual Activity  . Alcohol use: Yes    Comment: 03/02/2014 "I rarely drink"  . Drug use: No  . Sexual activity: Not Currently  Other Topics Concern  . Not on file  Social History Narrative   Pt lives in 1 story home with his mother and step father   9th grade education   Last occupation was with [employer] in 2009      Unemployed at this time- thinking of filing for disability      Right handed      Social Determinants of Health   Financial Resource Strain:   . Difficulty of Paying Living Expenses:   Food Insecurity:   . Worried About Charity fundraiser in the Last Year:   . Arboriculturist in the Last Year:   Transportation Needs:   . Film/video editor (Medical):   Marland Kitchen Lack of Transportation (Non-Medical):   Physical Activity:   . Days of Exercise per Week:   . Minutes of Exercise per Session:   Stress:   . Feeling of Stress :   Social Connections:   . Frequency of Communication with Friends and Family:   . Frequency of Social Gatherings with Friends and Family:   . Attends Religious Services:   . Active Member of Clubs or Organizations:   . Attends Archivist Meetings:   Marland Kitchen Marital Status:   Intimate Partner Violence:   . Fear of Current or Ex-Partner:   . Emotionally Abused:   Marland Kitchen Physically Abused:   . Sexually Abused:     Review of Systems: See HPI. All other systems reviewed and are negative.  Physical Exam: Vital signs in last 24 hours: Temp:  [97.1 F (36.2 C)-98 F (36.7 C)] 98 F (36.7 C) (05/05 1153) Pulse Rate:  [84-103] 84 (05/05 1153) Resp:  [18-20] 20 (05/05 1153) BP: (119-120)/(80-83) 120/83 (05/05 1153) SpO2:  [95 %] 95 % (05/05 1119) Weight:  [142.9 kg] 142.9 kg (05/05 1153) Last BM Date: 08/17/19 General:  Alert, well-developed, well-nourished, pleasant and cooperative in NAD. Head:  Normocephalic and atraumatic. Eyes:  No  scleral icterus. Conjunctiva pink. Ears:   Normal auditory acuity. Nose:  No deformity, discharge or lesions. Mouth: Dentition intact. No ulcers or lesions.  Neck:  Supple. No lymphadenopathy or thyromegaly.  Lungs: Breath sounds clear throughout.   Heart:  RRR, no murmurs.  Abdomen:  Obese abdomen. Soft, nontender, nondistended. + BS x 4 quads. Umbilical hernia with firm component to the umbilicus, nontender. Positive bowel sounds x 4 quads. No HSM. Rectal: Deferred. Musculoskeletal:  Symmetrical without gross deformities.  Pulses:  Normal pulses noted. Extremities:  Bilateral LEs with venous stasis, 1 + edema bilaterally.  Neurologic:  Alert and  oriented x4. No focal deficits.  Skin:  Intact without significant lesions or rashes. Psych:  Alert and cooperative. Normal mood and affect.  Intake/Output from previous day: No intake/output data recorded. Intake/Output this shift: No intake/output data recorded.  Lab Results: Recent Labs    08/15/19 1754  WBC 10.9*  HGB 14.8  HCT 43.7  PLT 316   BMET Recent Labs    08/15/19 1754  NA 136  K 3.6  CL 98  CO2 23  GLUCOSE 184*  BUN 23*  CREATININE 0.86  CALCIUM 9.5   LFT Recent Labs    08/15/19 1754  PROT 7.9  ALBUMIN 4.1  AST 34  ALT 33  ALKPHOS 56  BILITOT 0.3   PT/INR No results for input(s): LABPROT, INR in the last 72 hours. Hepatitis Panel No results for input(s): HEPBSAG, HCVAB, HEPAIGM, HEPBIGM in the last 72 hours.    Studies/Results: No results found.  IMPRESSION/PLAN:  56.  49 year old male with acute on chronic epigastric pain, melenic stool x 2 on 5/2. Stable Hg 14.4.  -Pantoprazole 55m po QD -EGD as inpatient verses outpatient, further recommendations per Dr. BTarri Glenn  -Repeat CBC, BMP and lipase in AM -Clear liquid diet for now  2. Diarrhea, resolved   3. DM II   4. Sleep apnea on Cpap  ADDENDUM: Patient to proceed with an EGD tomorrow 08/18/2019 at 10 AM with Dr. BTarri Glenn  EGD benefits and risks discussed with the patient  including risk with sedation, bleeding, infection and perforation.  He wishes to proceed with the EGD as recommended.   CPine 08/17/2019, 12:01 PM

## 2019-08-18 ENCOUNTER — Encounter (HOSPITAL_COMMUNITY): Payer: Self-pay | Admitting: Internal Medicine

## 2019-08-18 ENCOUNTER — Ambulatory Visit: Payer: Self-pay | Admitting: Physician Assistant

## 2019-08-18 ENCOUNTER — Observation Stay (HOSPITAL_COMMUNITY): Payer: Self-pay | Admitting: Certified Registered"

## 2019-08-18 ENCOUNTER — Telehealth: Payer: Self-pay

## 2019-08-18 ENCOUNTER — Encounter (HOSPITAL_COMMUNITY)
Admission: AD | Disposition: A | Discharge status: Home / Self Care | Payer: Self-pay | Source: Ambulatory Visit | Attending: Internal Medicine

## 2019-08-18 DIAGNOSIS — E785 Hyperlipidemia, unspecified: Secondary | ICD-10-CM

## 2019-08-18 DIAGNOSIS — K2970 Gastritis, unspecified, without bleeding: Secondary | ICD-10-CM

## 2019-08-18 DIAGNOSIS — R1013 Epigastric pain: Principal | ICD-10-CM

## 2019-08-18 DIAGNOSIS — K3189 Other diseases of stomach and duodenum: Secondary | ICD-10-CM

## 2019-08-18 DIAGNOSIS — R0789 Other chest pain: Secondary | ICD-10-CM

## 2019-08-18 DIAGNOSIS — K2100 Gastro-esophageal reflux disease with esophagitis, without bleeding: Secondary | ICD-10-CM

## 2019-08-18 DIAGNOSIS — F411 Generalized anxiety disorder: Secondary | ICD-10-CM

## 2019-08-18 DIAGNOSIS — R195 Other fecal abnormalities: Secondary | ICD-10-CM

## 2019-08-18 DIAGNOSIS — I10 Essential (primary) hypertension: Secondary | ICD-10-CM

## 2019-08-18 DIAGNOSIS — G4733 Obstructive sleep apnea (adult) (pediatric): Secondary | ICD-10-CM

## 2019-08-18 DIAGNOSIS — E1169 Type 2 diabetes mellitus with other specified complication: Secondary | ICD-10-CM

## 2019-08-18 HISTORY — PX: BIOPSY: SHX5522

## 2019-08-18 HISTORY — PX: ESOPHAGOGASTRODUODENOSCOPY (EGD) WITH PROPOFOL: SHX5813

## 2019-08-18 LAB — CBC
HCT: 39.8 % (ref 39.0–52.0)
Hemoglobin: 12.9 g/dL — ABNORMAL LOW (ref 13.0–17.0)
MCH: 27.6 pg (ref 26.0–34.0)
MCHC: 32.4 g/dL (ref 30.0–36.0)
MCV: 85.2 fL (ref 80.0–100.0)
Platelets: 245 10*3/uL (ref 150–400)
RBC: 4.67 MIL/uL (ref 4.22–5.81)
RDW: 13.3 % (ref 11.5–15.5)
WBC: 7.6 10*3/uL (ref 4.0–10.5)
nRBC: 0 % (ref 0.0–0.2)

## 2019-08-18 LAB — GLUCOSE, CAPILLARY
Glucose-Capillary: 140 mg/dL — ABNORMAL HIGH (ref 70–99)
Glucose-Capillary: 126 mg/dL — ABNORMAL HIGH (ref 70–99)
Glucose-Capillary: 136 mg/dL — ABNORMAL HIGH (ref 70–99)
Glucose-Capillary: 99 mg/dL (ref 70–99)

## 2019-08-18 LAB — BASIC METABOLIC PANEL
Anion gap: 12 (ref 5–15)
BUN: 16 mg/dL (ref 6–20)
CO2: 26 mmol/L (ref 22–32)
Calcium: 8.5 mg/dL — ABNORMAL LOW (ref 8.9–10.3)
Chloride: 100 mmol/L (ref 98–111)
Creatinine, Ser: 0.84 mg/dL (ref 0.61–1.24)
GFR calc Af Amer: >60 mL/min (ref >60)
GFR calc non Af Amer: >60 mL/min (ref >60)
Glucose, Bld: 124 mg/dL — ABNORMAL HIGH (ref 70–99)
Potassium: 3.5 mmol/L (ref 3.5–5.1)
Sodium: 138 mmol/L (ref 135–145)

## 2019-08-18 SURGERY — ESOPHAGOGASTRODUODENOSCOPY (EGD) WITH PROPOFOL
Anesthesia: Monitor Anesthesia Care

## 2019-08-18 MED ORDER — LOSARTAN POTASSIUM 50 MG PO TABS: 100.0000 mg | ORAL_TABLET | Freq: Every day | ORAL | Status: DC

## 2019-08-18 MED ORDER — PROPOFOL 10 MG/ML IV BOLUS
INTRAVENOUS | Status: DC | PRN
  Administered 2019-08-18 (×9): 50 mg via INTRAVENOUS

## 2019-08-18 MED ORDER — OMEPRAZOLE MAGNESIUM 20 MG PO TBEC
40.0000 mg | DELAYED_RELEASE_TABLET | Freq: Two times a day (BID) | ORAL | 0 refills | Status: DC
Start: 2019-08-18 — End: 2020-02-07

## 2019-08-18 MED ORDER — AMLODIPINE BESYLATE 10 MG PO TABS: 10.0000 mg | ORAL_TABLET | Freq: Every day | ORAL | Status: DC

## 2019-08-18 MED ORDER — SUCRALFATE 1 GM/10ML PO SUSP
1.0000 g | Freq: Four times a day (QID) | ORAL | 0 refills | Status: DC
Start: 2019-08-18 — End: 2019-09-06

## 2019-08-18 MED ORDER — PHENYLEPHRINE 40 MCG/ML (10ML) SYRINGE FOR IV PUSH (FOR BLOOD PRESSURE SUPPORT)
PREFILLED_SYRINGE | INTRAVENOUS | Status: DC | PRN
  Administered 2019-08-18 (×2): 120 ug via INTRAVENOUS

## 2019-08-18 MED ORDER — LOSARTAN POTASSIUM-HCTZ 100-12.5 MG PO TABS: 1.0000 | ORAL_TABLET | Freq: Every day | ORAL | Status: DC

## 2019-08-18 MED ORDER — HYDROCHLOROTHIAZIDE 12.5 MG PO CAPS
12.5000 mg | ORAL_CAPSULE | Freq: Every day | ORAL | Status: DC
  Administered 2019-08-18: 12.5 mg via ORAL
  Filled 2019-08-18: qty 1

## 2019-08-18 MED ORDER — LIDOCAINE 2% (20 MG/ML) 5 ML SYRINGE
INTRAMUSCULAR | Status: DC | PRN
  Administered 2019-08-18: 60 mg via INTRAVENOUS

## 2019-08-18 MED ORDER — HYDROCHLOROTHIAZIDE 12.5 MG PO CAPS: 12.5000 mg | ORAL_CAPSULE | Freq: Every day | ORAL | Status: DC

## 2019-08-18 MED ORDER — AMLODIPINE BESYLATE 10 MG PO TABS
10.0000 mg | ORAL_TABLET | Freq: Every day | ORAL | Status: DC
  Administered 2019-08-18: 10 mg via ORAL
  Filled 2019-08-18: qty 1

## 2019-08-18 MED ORDER — LOSARTAN POTASSIUM 50 MG PO TABS
100.0000 mg | ORAL_TABLET | Freq: Every day | ORAL | Status: DC
  Administered 2019-08-18: 16:00:00 100 mg via ORAL
  Filled 2019-08-18: qty 2

## 2019-08-18 MED ORDER — LACTATED RINGERS IV SOLN
INTRAVENOUS | Status: DC
  Administered 2019-08-18: 10:00:00 1000 mL via INTRAVENOUS

## 2019-08-18 MED FILL — CARAFATE 1 GM/10 ML SUSP: 1 | 14 days supply | Qty: 560 | Fill #0

## 2019-08-18 MED FILL — ?OMEPRAZOLE 20 MG CAP DR: 20 | 30 days supply | Qty: 120 | Fill #0

## 2019-08-18 SURGICAL SUPPLY — 15 items

## 2019-08-18 NOTE — Transfer of Care (Addendum)
Immediate Anesthesia Transfer of Care Note  Patient: Juan Kline  Procedure(s) Performed: ESOPHAGOGASTRODUODENOSCOPY (EGD) WITH PROPOFOL (N/A ) BIOPSY  Patient Location: PACU and Endoscopy Unit  Anesthesia Type:MAC  Level of Consciousness: awake and alert   Airway & Oxygen Therapy: Patient Spontanous Breathing and Patient connected to face mask oxygen  Post-op Assessment: Report given to RN and Post -op Vital signs reviewed and stable  Post vital signs: Reviewed and stable  Last Vitals:  Vitals Value Taken Time  BP 143/76 08/18/19 1055  Temp    Pulse 87 08/18/19 1057  Resp 18 08/18/19 1057  SpO2 100 % 08/18/19 1057  Vitals shown include unvalidated device data.  Last Pain:  Vitals:   08/18/19 1055  TempSrc:   PainSc: 0-No pain         Complications: No apparent anesthesia complications

## 2019-08-18 NOTE — Telephone Encounter (Signed)
Pt scheduled to see Cottie Banda NP 09/08/19@10 :30am, appt letter mailed to pt. Pt can be scheduled for screening colon at that appt.

## 2019-08-18 NOTE — Telephone Encounter (Signed)
-----   Message from Thornton Park, MD sent at 08/18/2019 10:50 AM EDT ----- Patient needs office follow-up with me or Colleen in the next couple of weeks. Schedule screening colonoscopy, as well.  Thank you.

## 2019-08-18 NOTE — Interval H&P Note (Signed)
History and Physical Interval Note:  08/18/2019 10:10 AM  Juan Kline  has presented today for surgery, with the diagnosis of epigastric pain, melena.  The various methods of treatment have been discussed with the patient and family. After consideration of risks, benefits and other options for treatment, the patient has consented to  Procedure(s): ESOPHAGOGASTRODUODENOSCOPY (EGD) WITH PROPOFOL (N/A) as a surgical intervention.  The patient's history has been reviewed, patient examined, no change in status, stable for surgery.  I have reviewed the patient's chart and labs.  Questions were answered to the patient's satisfaction.     Thornton Park

## 2019-08-18 NOTE — Anesthesia Procedure Notes (Signed)
Procedure Name: MAC Date/Time: 08/18/2019 10:23 AM Performed by: Cynda Familia, CRNA Pre-anesthesia Checklist: Patient identified, Emergency Drugs available, Suction available, Patient being monitored and Timeout performed Patient Re-evaluated:Patient Re-evaluated prior to induction Oxygen Delivery Method: Simple face mask Placement Confirmation: positive ETCO2 and breath sounds checked- equal and bilateral Dental Injury: Teeth and Oropharynx as per pre-operative assessment

## 2019-08-18 NOTE — Anesthesia Preprocedure Evaluation (Signed)
Anesthesia Evaluation  Patient identified by MRN, date of birth, ID band Patient awake    Reviewed: Allergy & Precautions, NPO status , Patient's Chart, lab work & pertinent test results  History of Anesthesia Complications Negative for: history of anesthetic complications  Airway Mallampati: II  TM Distance: >3 FB Neck ROM: Full    Dental  (+) Teeth Intact   Pulmonary asthma , sleep apnea , COPD, former smoker,    Pulmonary exam normal        Cardiovascular hypertension, Normal cardiovascular exam     Neuro/Psych PSYCHIATRIC DISORDERS Anxiety Depression negative neurological ROS     GI/Hepatic negative GI ROS, Neg liver ROS,   Endo/Other  diabetes, Type 2, Oral Hypoglycemic AgentsMorbid obesity  Renal/GU negative Renal ROS  negative genitourinary   Musculoskeletal negative musculoskeletal ROS (+)   Abdominal   Peds  Hematology negative hematology ROS (+)   Anesthesia Other Findings   Reproductive/Obstetrics                             Anesthesia Physical Anesthesia Plan  ASA: III  Anesthesia Plan: MAC   Post-op Pain Management:    Induction: Intravenous  PONV Risk Score and Plan: Propofol infusion, TIVA and Treatment may vary due to age or medical condition  Airway Management Planned: Natural Airway, Nasal Cannula and Simple Face Mask  Additional Equipment: None  Intra-op Plan:   Post-operative Plan:   Informed Consent: I have reviewed the patients History and Physical, chart, labs and discussed the procedure including the risks, benefits and alternatives for the proposed anesthesia with the patient or authorized representative who has indicated his/her understanding and acceptance.       Plan Discussed with:   Anesthesia Plan Comments:         Anesthesia Quick Evaluation

## 2019-08-18 NOTE — Anesthesia Postprocedure Evaluation (Signed)
Anesthesia Post Note  Patient: Juan Kline  Procedure(s) Performed: ESOPHAGOGASTRODUODENOSCOPY (EGD) WITH PROPOFOL (N/A ) BIOPSY     Patient location during evaluation: Endoscopy Anesthesia Type: MAC Level of consciousness: awake and alert Pain management: pain level controlled Vital Signs Assessment: post-procedure vital signs reviewed and stable Respiratory status: spontaneous breathing, nonlabored ventilation and respiratory function stable Cardiovascular status: blood pressure returned to baseline and stable Postop Assessment: no apparent nausea or vomiting Anesthetic complications: no    Last Vitals:  Vitals:   08/18/19 1133 08/18/19 1241  BP: 127/89 (!) 145/104  Pulse: 81 83  Resp: 16 20  Temp: 36.7 C 36.7 C  SpO2: 93% 93%    Last Pain:  Vitals:   08/18/19 1241  TempSrc: Oral  PainSc:                  Lidia Collum

## 2019-08-18 NOTE — Op Note (Addendum)
[facility] Patient Name: Juan Kline Procedure Date: 08/18/2019 MRN: [id number] Attending MD: Thornton Park MD, MD Date of Birth: 06-08-1970 CSN: [id number] Age: 49 Admit Type: Inpatient Procedure:                Upper GI endoscopy Indications:              Epigastric abdominal pain, Melena, Unexplained                            chest pain Providers:                Thornton Park MD, MD, Cleda Daub, RN, Cherylynn Ridges, Technician, Glenis Smoker, CRNA Referring MD:              Medicines:                Monitored Anesthesia Care Complications:            No immediate complications. Estimated Blood Loss:     Estimated blood loss was minimal. Procedure:                Pre-Anesthesia Assessment:                           - Prior to the procedure, a History and Physical                            was performed, and patient medications and                            allergies were reviewed. The patient's tolerance of                            previous anesthesia was also reviewed. The risks                            and benefits of the procedure and the sedation                            options and risks were discussed with the patient.                            All questions were answered, and informed consent                            was obtained. Prior Anticoagulants: The patient has                            taken no previous anticoagulant or antiplatelet                            agents. ASA Grade Assessment: II - A patient with  mild systemic disease. After reviewing the risks                            and benefits, the patient was deemed in                            satisfactory condition to undergo the procedure.                           After obtaining informed consent, the endoscope was                            passed under direct vision. Throughout the                            procedure,  the patient's blood pressure, pulse, and                            oxygen saturations were monitored continuously. The                            [id number] Olympus gastroscope was                            introduced through the mouth, and advanced to the                            third part of duodenum. The upper GI endoscopy was                            accomplished without difficulty. The patient                            tolerated the procedure well. Scope In: Scope Out: Findings:      LA Grade A (one or more mucosal breaks less than 5 mm, not extending       between tops of 2 mucosal folds) esophagitis with no bleeding was found.       Biopsies were taken from the mid/prox and distal esophagus with a cold       forceps for histology. Estimated blood loss was minimal.      Patchy mild inflammation characterized by erythema, friability and       granularity was found in the gastric body and fundus. There were some       subtle verrucous changes in a focal area of the antrum. Biopsies were       taken from the antrum, body, and fundus with a cold forceps for       histology. Estimated blood loss was minimal.      Diffuse mildly erythematous mucosa without active bleeding and with no       stigmata of bleeding was found in the duodenal bulb. Biopsies were taken       with a cold forceps for histology. Estimated blood loss was minimal.      The cardia and gastric fundus were normal on retroflexion.      The exam was otherwise  without abnormality. Impression:               - LA Grade A reflux esophagitis with no bleeding.                            Biopsied.                           - Mild gastritis. Biopsied.                           - Erythematous duodenopathy. Biopsied.                           - The examination was otherwise normal. Moderate Sedation:      Not Applicable - Patient had care per Anesthesia. Recommendation:           - Patient has a contact number  available for                            emergencies. The signs and symptoms of potential                            delayed complications were discussed with the                            patient. Return to normal activities tomorrow.                            Written discharge instructions were provided to the                            patient.                           - Resume previous diet.                           - Continue present medications.                           - Discharge home on omeprazole 40 mg twice daily x                            8 weeks and carafate 1 g slurry QID x 2 weeks.                           - No aspirin, ibuprofen, naproxen, or other                            non-steroidal anti-inflammatory drugs.                           - Await pathology results.                           - Outpatient follow-up at  [facility] to address                            symptoms and for screening colonoscopy.                           - No additional inpatient GI evaluation planned at                            this time. Will move to stand-by. Please call the                            on-call gastroenterologist with any additional                            questions or concerns. Procedure Code(s):        --- Professional ---                           [phone], Esophagogastroduodenoscopy, flexible,                            transoral; with biopsy, single or multiple Diagnosis Code(s):        --- Professional ---                           K21.00, Gastro-esophageal reflux disease with                            esophagitis, without bleeding                           K29.70, Gastritis, unspecified, without bleeding                           K31.89, Other diseases of stomach and duodenum                           R10.13, Epigastric pain                           R07.9, Chest pain, unspecified                           K92.1, Melena (includes Hematochezia) CPT copyright  2019 [employer]. All rights reserved. The codes documented in this report are preliminary and upon coder review may  be revised to meet current compliance requirements. Thornton Park MD, MD 08/18/2019 10:57:04 AM This report has been signed electronically. Number of Addenda: 0

## 2019-08-18 NOTE — Discharge Summary (Signed)
Physician Discharge Summary  Juan Kline HXT:[id number] DOB: 03/26/1971 DOA: 08/17/2019  PCP: Ladell Pier, MD  Admit date: 08/17/2019 Discharge date: 08/18/2019  Admitted From: Home  Discharge disposition: Home   Recommendations for Outpatient Follow-Up:   Follow up with your primary care provider in one week.  Discuss about your dizziness. Check CBC, BMP, magnesium in the next visit. Follow-up with GI for screening colonoscopy.   Discharge Diagnosis:   Principal Problem:   Epigastric pain Active Problems:   history of Hypertension   Morbid obesity (HCC)   GAD (generalized anxiety disorder)   Atypical chest pain   OSA (obstructive sleep apnea)   Vapes nicotine containing substance   Hyperlipidemia associated with type 2 diabetes mellitus ([place])   Heme positive stool   Discharge Condition: Improved.  Diet recommendation: Carbohydrate-modified.    Wound care: None.  Code status: Full.   History of Present Illness:   Juan Kline is a 49 year old male, lives with his mother, independent, works as a Scientist, water quality, sent by his PCP after seen in the clinic  as a direct admission to [facility] medical floor.  He has PMH of DM2, HTN, HLD, anxiety, OSA on CPAP, morbid obesity, COPD.    Presented to hospital with multiple nonspecific symptoms including dizziness chest discomfort  weakness.  Denied any syncope. Reported 3 weeks history of diarrhea in the early part of April after his first dose of Covid vaccine.  Appears to have completed 2 doses of Covid vaccine/[employer].  At that time he had 5-10 episodes of watery diarrhea without mucus or blood, this was self-limited and has resolved since.  Reports chronic history of intermittent epigastric pain for years with periods worsening and periods pain. 3 days prior to admission, had 2 episodes of black tarry stools without red blood.  Seen in the ED at which time lab work were unremarkable, previously elevated lipase had normalized,  CT abdomen without acute findings, he was started on oral Protonix and has follow-up appointment with [facility] on 5/6.  He was seen at PCPs office was seen in ED 3 times over the past 5 days. Troponins and EKGs were unremarkable.  Patient was directly admitted to the medical floor.  Hospital Course:   Following conditions were addressed during hospitalization as listed below,  Essential hypertension, dizziness.  There was some mention of orthostatic hypertension but on repeat assessment he was not orthostatic.  She does have chronic dizziness and had undergone extensive work-up in the past including MRI of the brain.  I have explained to him that he will need to touch base with the primary care physician to discern this problem.  Resume antihypertensives on discharge   Epigastric pain/FOBT +: Intermittent elevation of lipase, normalized on 5/3.  CT abdomen on 5/3 without acute findings.  FOBT recently positive in ED on 5/3.  No history of regular NSAID intake or alcohol use.  Prolonged history of intermittent epigastric pain.  Episode of black tarry stools a couple days prior to admission.  Some concern for UGIB but does not seem significant given stability of hemoglobin.  GI was consulted and underwent endoscopic evaluation with findings of esophagitis and diffuse gastritis.  GI recommended PPI twice a day and sucralfate on discharge.  He was extensively advised to avoid NSAIDs.    Atypical chest pain: Likely secondary to gastritis.  EKG was normal sinus rhythm.  Troponins were negative. Acute coronary syndrome was ruled out.   Type II DM with diabetic polyneuropathy:  Resume Metformin on discharge.   OSA on CPAP: Continue CPAP.   Morbid obesity: Lifestyle modifications to be continued   Elevated lipase: Unclear etiology.     Disposition.  At this time, patient is stable for disposition home with outpatient follow-up with PCP and gastroenterology. Spoke with the patient's father at  bedside.  Medical Consultants:   Gastroenterology  Procedures:    EGD with biopsy.  Subjective:   Today, patient was seen before and after endoscopy.  States that he has had chronic dizziness and had multiple work-up in the past.  Denies any nausea, vomiting but has nonspecific epigastric. chest discomfort.  Had bowel movement yesterday  Discharge Exam:   Vitals:   08/18/19 1406 08/18/19 1552  BP: (!) 143/100 134/87  Pulse: (!) 108   Resp:    Temp:    SpO2: 94%    Vitals:   08/18/19 1401 08/18/19 1402 08/18/19 1406 08/18/19 1552  BP: (!) 147/101 (!) 141/95 (!) 143/100 134/87  Pulse: (!) 104 (!) 107 (!) 108   Resp:      Temp:      TempSrc:      SpO2: 94% 94% 94%   Weight:      Height:       General: Alert awake, not in obvious distress, obese HENT: pupils equally reacting to light,  No scleral pallor or icterus noted. Oral mucosa is moist.  Chest:  Clear breath sounds.  Diminished breath sounds bilaterally. No crackles or wheezes.  CVS: S1 &S2 heard. No murmur.  Regular rate and rhythm. Abdomen: Soft, mild epigastric tenderness on palpation nondistended.  Bowel sounds are heard.  Extremities: No cyanosis, clubbing or edema.  Peripheral pulses are palpable. Psych: Alert, awake and oriented, normal mood CNS:  No cranial nerve deficits.  Power equal in all extremities.   Skin: Warm and dry.  No rashes noted.  The results of significant diagnostics from this hospitalization (including imaging, microbiology, ancillary and laboratory) are listed below for reference.     Diagnostic Studies:   No results found.   Labs:   Basic Metabolic Panel: Recent Labs  Lab 08/13/19 0128 08/13/19 0128 08/14/19 0141 08/14/19 0141 08/15/19 1754 08/15/19 1754 08/17/19 1228 08/18/19 0408  NA 138  --  137  --  136  --  136 138  K 3.6   < > 3.8   < > 3.6   < > 3.5 3.5  CL 98  --  99  --  98  --  98 100  CO2 25  --  26  --  23  --  25 26  GLUCOSE 142*  --  130*  --  184*  --   129* 124*  BUN 23*  --  21*  --  23*  --  21* 16  CREATININE 0.91  --  0.95  --  0.86  --  0.87 0.84  CALCIUM 9.4  --  9.0  --  9.5  --  9.0 8.5*   < > = values in this interval not displayed.   GFR Estimated Creatinine Clearance: 153.2 mL/min (by C-G formula based on SCr of 0.84 mg/dL). Liver Function Tests: Recent Labs  Lab 08/14/19 0141 08/15/19 1754 08/17/19 1228  AST 27 34 28  ALT 28 33 36  ALKPHOS 52 56 56  BILITOT 0.6 0.3 0.6  PROT 7.8 7.9 7.8  ALBUMIN 4.0 4.1 4.0   Recent Labs  Lab 08/14/19 0141 08/15/19 1754 08/17/19 1228  LIPASE 432* 37  30   No results for input(s): AMMONIA in the last 168 hours. Coagulation profile Recent Labs  Lab 08/13/19 0128  INR 1.2    CBC: Recent Labs  Lab 08/13/19 0128 08/14/19 0141 08/15/19 1754 08/17/19 1228 08/18/19 0408  WBC 7.8 9.4 10.9* 11.2* 7.6  NEUTROABS  --  6.0  --   --   --   HGB 13.9 14.2 14.8 14.4 12.9*  HCT 43.1 42.6 43.7 43.5 39.8  MCV 84.8 82.6 81.7 83.5 85.2  PLT 280 277 316 293 245   Cardiac Enzymes: No results for input(s): CKTOTAL, CKMB, CKMBINDEX, TROPONINI in the last 168 hours. BNP: Invalid input(s): POCBNP CBG: Recent Labs  Lab 08/17/19 1619 08/17/19 2100 08/18/19 0718 08/18/19 0952 08/18/19 1144  GLUCAP 116* 123* 140* 126* 136*   D-Dimer No results for input(s): DDIMER in the last 72 hours. Hgb A1c Recent Labs    08/17/19 1230  HGBA1C 6.8*   Lipid Profile No results for input(s): CHOL, HDL, LDLCALC, TRIG, CHOLHDL, LDLDIRECT in the last 72 hours. Thyroid function studies No results for input(s): TSH, T4TOTAL, T3FREE, THYROIDAB in the last 72 hours.  Invalid input(s): FREET3 Anemia work up No results for input(s): VITAMINB12, FOLATE, FERRITIN, TIBC, IRON, RETICCTPCT in the last 72 hours. Microbiology Recent Results (from the past 240 hour(s))  C difficile Toxins A+B W/Rflx     Status: None   Collection Time: 08/11/19 11:01 AM   Specimen: Stool   ST  Result Value Ref Range  Status   C difficile Toxins A+B, EIA Negative Negative Final  C difficile, Cytotoxin B     Status: None   Collection Time: 08/11/19 11:01 AM   ST  Result Value Ref Range Status   C diff Toxin B Final report  Final    Comment:                             Reference Range: Negative   Result 1 Comment  Final    Comment: Negative No cytotoxin detected.   Respiratory Panel by RT PCR (Flu A&B, Covid) - Nasopharyngeal Swab     Status: None   Collection Time: 08/17/19 12:04 PM   Specimen: Nasopharyngeal Swab  Result Value Ref Range Status   SARS Coronavirus 2 by RT PCR NEGATIVE NEGATIVE Final    Comment: (NOTE) SARS-CoV-2 target nucleic acids are NOT DETECTED. The SARS-CoV-2 RNA is generally detectable in upper respiratoy specimens during the acute phase of infection. The lowest concentration of SARS-CoV-2 viral copies this assay can detect is 131 copies/mL. A negative result does not preclude SARS-Cov-2 infection and should not be used as the sole basis for treatment or other patient management decisions. A negative result may occur with  improper specimen collection/handling, submission of specimen other than nasopharyngeal swab, presence of viral mutation(s) within the areas targeted by this assay, and inadequate number of viral copies (<131 copies/mL). A negative result must be combined with clinical observations, patient history, and epidemiological information. The expected result is Negative. Fact Sheet for Patients:  PinkCheek.be Fact Sheet for Healthcare Providers:  GravelBags.it This test is not yet ap proved or cleared by the [place] FDA and  has been authorized for detection and/or diagnosis of SARS-CoV-2 by FDA under an Emergency Use Authorization (EUA). This EUA will remain  in effect (meaning this test can be used) for the duration of the COVID-19 declaration under Section 564(b)(1) of the Act, 21  U.S.C. section  360bbb-3(b)(1), unless the authorization is terminated or revoked sooner.    Influenza A by PCR NEGATIVE NEGATIVE Final   Influenza B by PCR NEGATIVE NEGATIVE Final    Comment: (NOTE) The Xpert Xpress SARS-CoV-2/FLU/RSV assay is intended as an aid in  the diagnosis of influenza from Nasopharyngeal swab specimens and  should not be used as a sole basis for treatment. Nasal washings and  aspirates are unacceptable for Xpert Xpress SARS-CoV-2/FLU/RSV  testing. Fact Sheet for Patients: PinkCheek.be Fact Sheet for Healthcare Providers: GravelBags.it This test is not yet approved or cleared by the [place] FDA and  has been authorized for detection and/or diagnosis of SARS-CoV-2 by  FDA under an Emergency Use Authorization (EUA). This EUA will remain  in effect (meaning this test can be used) for the duration of the  Covid-19 declaration under Section 564(b)(1) of the Act, 21  U.S.C. section 360bbb-3(b)(1), unless the authorization is  terminated or revoked. Performed at [facility], [place] [address] [place], [place] [address]      Discharge Instructions:   Discharge Instructions     Diet Carb Modified   Complete by: As directed    Discharge instructions   Complete by: As directed    Please do not take aspirin, ibuprofen, naproxen, or other non-steroidal anti-inflammatory drugs.  Please follow-up with outpatient [facility] for screening colonoscopy.  Office will set up an appointment with you.  Follow-up with your primary care provider in 1 to 2 weeks or as scheduled by you to discuss about your dizziness symptoms.   Increase activity slowly   Complete by: As directed       Allergies as of 08/18/2019       Reactions   Lyrica [pregabalin] Anxiety        Medication List     STOP taking these medications    pantoprazole 20 MG tablet Commonly known as: PROTONIX        TAKE these medications    albuterol 108 (90 Base) MCG/ACT inhaler Commonly known as: VENTOLIN HFA Inhale 2 puffs into the lungs every 6 (six) hours as needed for wheezing or shortness of breath.   amLODipine 10 MG tablet Commonly known as: NORVASC Take 1 tablet (10 mg total) by mouth daily.   atorvastatin 40 MG tablet Commonly known as: LIPITOR Take 1 tablet (40 mg total) by mouth daily.   diphenoxylate-atropine 2.5-0.025 MG tablet Commonly known as: Lomotil Take 1 tablet by mouth 4 (four) times daily as needed for diarrhea or loose stools.   famotidine 20 MG tablet Commonly known as: PEPCID Take 1 tablet (20 mg total) by mouth 2 (two) times daily.   glucose blood test strip Commonly known as: True Metrix Blood Glucose Test Use as instructed   losartan-hydrochlorothiazide 100-12.5 MG tablet Commonly known as: HYZAAR HOLD FOR NOW What changed:  how much to take how to take this when to take this   metFORMIN 500 MG tablet Commonly known as: GLUCOPHAGE HOLD FOR NOW   omeprazole 20 MG tablet Commonly known as: PriLOSEC OTC Take 2 tablets (40 mg total) by mouth 2 (two) times daily.   sertraline 100 MG tablet Commonly known as: ZOLOFT Take 1.5 tablets (150 mg total) by mouth daily.   sucralfate 1 GM/10ML suspension Commonly known as: Carafate Take 10 mLs (1 g total) by mouth 4 (four) times daily for 14 days.   tiotropium 18 MCG inhalation capsule Commonly known as: SPIRIVA Place 1 capsule (18 mcg total) into inhaler  and inhale daily. What changed:  when to take this reasons to take this   True Metrix Meter w/Device Kit Use as directed   TRUEplus Lancets 28G Misc Use as directed         Time coordinating discharge: 39 minutes  Signed:  Mccall Will  [facility] 08/18/2019, 3:58 PM

## 2019-08-19 ENCOUNTER — Other Ambulatory Visit: Payer: Self-pay

## 2019-08-19 ENCOUNTER — Encounter: Payer: Self-pay | Admitting: *Deleted

## 2019-08-19 ENCOUNTER — Encounter: Payer: Self-pay | Admitting: Gastroenterology

## 2019-08-19 LAB — SURGICAL PATHOLOGY

## 2019-08-24 ENCOUNTER — Other Ambulatory Visit: Payer: Self-pay

## 2019-08-24 ENCOUNTER — Ambulatory Visit: Payer: Self-pay | Attending: Critical Care Medicine | Admitting: Critical Care Medicine

## 2019-08-24 ENCOUNTER — Encounter: Payer: Self-pay | Admitting: Critical Care Medicine

## 2019-08-24 VITALS — BP 138/86 | HR 98 | Temp 98.0°F | Resp 18 | Ht 69.0 in | Wt 308.0 lb

## 2019-08-24 DIAGNOSIS — I10 Essential (primary) hypertension: Secondary | ICD-10-CM

## 2019-08-24 DIAGNOSIS — R195 Other fecal abnormalities: Secondary | ICD-10-CM

## 2019-08-24 DIAGNOSIS — R42 Dizziness and giddiness: Secondary | ICD-10-CM

## 2019-08-24 DIAGNOSIS — E1142 Type 2 diabetes mellitus with diabetic polyneuropathy: Secondary | ICD-10-CM

## 2019-08-24 DIAGNOSIS — R930 Abnormal findings on diagnostic imaging of skull and head, not elsewhere classified: Secondary | ICD-10-CM

## 2019-08-24 DIAGNOSIS — G4733 Obstructive sleep apnea (adult) (pediatric): Secondary | ICD-10-CM

## 2019-08-24 DIAGNOSIS — K2900 Acute gastritis without bleeding: Secondary | ICD-10-CM

## 2019-08-24 LAB — GLUCOSE, POCT (MANUAL RESULT ENTRY): POC Glucose: 146 mg/dl — AB (ref 70–99)

## 2019-08-24 MED ORDER — LOSARTAN POTASSIUM-HCTZ 100-12.5 MG PO TABS: 1.0000 | ORAL_TABLET | Freq: Every day | ORAL | 3 refills | Status: DC

## 2019-08-24 MED ORDER — METFORMIN HCL 500 MG PO TABS: 500.0000 mg | ORAL_TABLET | Freq: Two times a day (BID) | ORAL | 6 refills | Status: DC

## 2019-08-24 NOTE — Assessment & Plan Note (Signed)
Orthostatic hypotension resolved  Patient resume losartan HCT and amlodipine as prescribed

## 2019-08-24 NOTE — Assessment & Plan Note (Signed)
Work-up shows acute gastritis and duodenitis without H. Pylori  Patient continues Carafate 4 times daily for 2 weeks total and continue twice daily proton pump inhibitor and follow-up with gastroenterology

## 2019-08-24 NOTE — Progress Notes (Signed)
Subjective:    Patient ID: Juan Kline, male    DOB: Mar 06, 1971, 49 y.o.   MRN: [id number] History of Present Illness: 49 y.o.M  HTN COPD OSA T2DM This is a telemedicine video visit The patient gives a history of having a Pfizer vaccine his first dose on April 2 and he states within 2 days he began having diarrhea fatigue headaches loss of taste and smell nausea muscle aches and severe fatigue The patient has another dose of Covid vaccine due on April 23  The patient has been staying out of work at this time.  He works at [employer] and is exposed to various customers.  He has been keeping up with his fluids.  He denies chest pain.  He states his dyspnea is stable at this time.  He denies a cough.  His diarrhea though is quite excessive.   08/10/2019 The patient returns today for a face-to-face visit to evaluate the patient's abdominal pain and ongoing diarrhea  The patient's continues to have loose stools without blood in the stools.  There is nausea without vomiting.  He has abdominal pain in the epigastric area.  His Covid test did come back negative.  I saw him on a video visit yesterday but need to bring him to the office for a physical exam.  Previous history of morbid obesity and hypertension along with sleep apnea and COPD mixed type with associated history of diabetes peripheral neuropathy and hyperlipidemia  08/17/2019 Patient returns today with progressive epigastric pain nausea without vomiting episodic tarry stools but decrease in diarrhea.  The patient continues to have a sense of fogginess in his thinking but no overt headaches.  He did have chest pain when his blood pressure was in the 1 7110 range the pain is improved now pain is in the anterior sternal area with radiation down the right arm.  He remains very weak and is taking in some nourishment but overall is not really improving.  He has been to the emergency room 3 times over the past 5 days.  Troponin studies and  ECGs were unremarkable but there is concern over coronary disease with his diabetes.  I held his Metformin because of his nausea and his glucoses have been in the 160 range off Metformin.  Note CT scan of the abdomen was unremarkable and lipase has been elevated but it dropped back down again with most recent ED visit.  The patient states with the Protonix that he was given from the emergency room his tarry stools are less but still has significant epigastric pain    08/24/2019 Adm from office 5/5  Encounter admission the patient had severe abdominal pain and was concerned about bleeding however his hemoglobin was stable.  The work-up showed significant gastritis and duodenitis and esophagitis.  Biopsies did not show H. pylori.  No malignancy seen.  Patient is now on Carafate and twice daily proton pump inhibitor with improved symptoms  See discharge summary as below  Dc summary: Admit date: 08/17/2019 Discharge date: 08/18/2019  Admitted From: Home  Discharge disposition: Home   Recommendations for Outpatient Follow-Up:   Follow up with your primary care provider in one week.  Discuss about your dizziness.  Check CBC, BMP, magnesium in the next visit.  Follow-up with GI for screening colonoscopy.   Discharge Diagnosis:  Principal Problem:   Epigastric pain Active Problems:   history of Hypertension   Morbid obesity (HCC)   GAD (generalized anxiety disorder)   Atypical  chest pain   OSA (obstructive sleep apnea)   Vapes nicotine containing substance   Hyperlipidemia associated with type 2 diabetes mellitus ([place])   Heme positive stool   Discharge Condition: Improved.  Diet recommendation: Carbohydrate-modified.    Wound care: None.  Code status: Full.   History of Present Illness:  Juan Kline a 49 year old male, lives with his mother, independent, works as a Scientist, water quality, sent by his PCP after seen in the clinic  as a direct admission to [facility] medical floor.He has PMH of DM2, HTN, HLD, anxiety, OSA on CPAP, morbid obesity, COPD.   Presented to hospital with multiple nonspecific symptoms including dizziness chest discomfort  weakness.  Denied any syncope.Reported 3 weeks history of diarrhea in the early part of April after his first dose of Covid vaccine. Appears to have completed 2 doses of Covid vaccine/Pfizer. At that time he had 5-10 episodes of watery diarrhea without mucus or blood, this was self-limited and has resolved since. Reports chronic history of intermittent epigastric pain for years with periodsworsening and periodspain. 3 days prior to admission, had 2 episodes of black tarry stools without red blood. Seen in the ED at which time lab work were unremarkable, previously elevated lipase had normalized, CT abdomen without acute findings, he was started on oral Protonix and has follow-up appointment with  GI on 5/6. He was seen at PCPs office was seen in ED 3 times over the past 5 days. Troponins and EKGs were unremarkable.Patient was directly admitted to the medical floor.  Hospital Course:  Following conditions were addressed during hospitalization as listed below,  Essential hypertension, dizziness.  There was some mention of orthostatic hypertension but on repeat assessment he was not orthostatic.  She does have chronic dizziness and had undergone extensive work-up in the past including MRI of the brain.  I have explained to him that he will need to touch base with the primary care physician to discern this problem.  Resume antihypertensives on discharge  Epigastric pain/FOBT +: Intermittent elevation of lipase, normalized on 5/3. CT abdomen on 5/3 without acute findings. FOBT recently positive in ED on 5/3. No history of regular NSAID intake or alcohol use. Prolonged history of intermittent epigastric pain. Episode of black tarry stools a couple days prior to admission. Some concern for  UGIBbut does not seem significant given stability of hemoglobin.  GI was consulted and underwent endoscopic evaluation with findings of esophagitis and diffuse gastritis.  GI recommended PPI twice a day and sucralfate on discharge.  He was extensively advised to avoid NSAIDs.  Atypical chest pain: Likely secondary to gastritis.  EKG was normal sinus rhythm.  Troponins were negative. Acute coronary syndrome was ruled out.  Type II DM with diabetic polyneuropathy: Resume Metformin on discharge.  OSA on CPAP: Continue CPAP.  Morbid obesity: Lifestyle modifications to be continued  Elevated lipase: Unclear etiology.   Disposition.  At this time, patient is stable for disposition home with outpatient follow-up with PCP and gastroenterology. Spoke with the patient's father at bedside.  Medical Consultants:   Gastroenterology  Procedures:   EGD with biopsy.   The patient continues to complain of dizziness Chronic problem for several years.  Upon review of the patient's status MRI was done in 2019 and showed hemorrhagic changes in the left occipital area which was not commented upon by neurology  Patient does have hypertension in the past with poorly controlled hypertension with mild and playing a role.  Patient denies any seizure conditions  at this time there was a question of seizures in the past  Patient is still vaping nicotine products  Past Medical History:  Diagnosis Date  . Chronic foot ulcer ([place]) admitted 03/02/2014   medial border first metatarsal left foot  . Diabetes ([place])   . Diabetic foot infection ([place])   . GAD (generalized anxiety disorder)   . Hyperlipidemia   . Hypertension Dx 2009  . Mild depression ([place])   . OSA (obstructive sleep apnea)    CPAP  . Osteomyelitis left first metatarsal 03/02/2014  . Sleep apnea   . Venous stasis ulcer of left lower extremity ([place])    [name] 03/02/2014     Family History  Problem Relation Age of Onset  .  Hypertension Mother   . Hyperlipidemia Mother   . Diabetes Mother   . Kidney disease Mother   . Hypertension Father   . Hyperlipidemia Father   . Diabetes Brother   . Hypertension Brother   . Hyperlipidemia Brother   . Heart disease Maternal Grandmother   . COPD Maternal Grandmother   . Diabetes Maternal Grandfather   . Heart attack Paternal Grandmother   . Heart disease Paternal Grandfather      Social History   Socioeconomic History  . Marital status: Single    Spouse name: Not on file  . Number of children: 0  . Years of education: Not on file  . Highest education level: Not on file  Occupational History  . Not on file  Tobacco Use  . Smoking status: Former Smoker    Packs/day: 1.50    Years: 26.00    Pack years: 39.00    Types: E-cigarettes, Cigarettes    Start date: 1989    Quit date: 02/16/2014    Years since quitting: 5.5  . Smokeless tobacco: Never Used  Substance and Sexual Activity  . Alcohol use: Yes    Comment: 03/02/2014 "I rarely drink"  . Drug use: No  . Sexual activity: Not Currently  Other Topics Concern  . Not on file  Social History Narrative   Pt lives in 1 story home with his mother and step father   9th grade education   Last occupation was with [employer] in 2009      Unemployed at this time- thinking of filing for disability      Right handed      Social Determinants of Health   Financial Resource Strain:   . Difficulty of Paying Living Expenses:   Food Insecurity:   . Worried About Charity fundraiser in the Last Year:   . Arboriculturist in the Last Year:   Transportation Needs:   . Film/video editor (Medical):   Marland Kitchen Lack of Transportation (Non-Medical):   Physical Activity:   . Days of Exercise per Week:   . Minutes of Exercise per Session:   Stress:   . Feeling of Stress :   Social Connections:   . Frequency of Communication with Friends and Family:   . Frequency of Social Gatherings with Friends and Family:   .  Attends Religious Services:   . Active Member of Clubs or Organizations:   . Attends Archivist Meetings:   Marland Kitchen Marital Status:   Intimate Partner Violence:   . Fear of Current or Ex-Partner:   . Emotionally Abused:   Marland Kitchen Physically Abused:   . Sexually Abused:      Allergies  Allergen Reactions  . Lyrica [Pregabalin] Anxiety  Outpatient Medications Prior to Visit  Medication Sig Dispense Refill  . albuterol (VENTOLIN HFA) 108 (90 Base) MCG/ACT inhaler Inhale 2 puffs into the lungs every 6 (six) hours as needed for wheezing or shortness of breath. 6.7 g 11  . amLODipine (NORVASC) 10 MG tablet Take 1 tablet (10 mg total) by mouth daily. 30 tablet 6  . atorvastatin (LIPITOR) 40 MG tablet Take 1 tablet (40 mg total) by mouth daily. 90 tablet 3  . Blood Glucose Monitoring Suppl (TRUE METRIX METER) w/Device KIT Use as directed 1 kit 0  . glucose blood (TRUE METRIX BLOOD GLUCOSE TEST) test strip Use as instructed 100 each 12  . omeprazole (PRILOSEC OTC) 20 MG tablet Take 2 tablets (40 mg total) by mouth 2 (two) times daily. 224 tablet 0  . sertraline (ZOLOFT) 100 MG tablet Take 1.5 tablets (150 mg total) by mouth daily. 45 tablet 5  . sucralfate (CARAFATE) 1 GM/10ML suspension Take 10 mLs (1 g total) by mouth 4 (four) times daily for 14 days. 560 mL 0  . tiotropium (SPIRIVA) 18 MCG inhalation capsule Place 1 capsule (18 mcg total) into inhaler and inhale daily. (Patient taking differently: Place 18 mcg into inhaler and inhale daily as needed (SOB/Wheezing). ) 30 capsule 6  . TRUEPLUS LANCETS 28G MISC Use as directed 100 each 2  . losartan-hydrochlorothiazide (HYZAAR) 100-12.5 MG tablet HOLD FOR NOW (Patient taking differently: Take 1 tablet by mouth daily. HOLD FOR NOW) 90 tablet 3  . diphenoxylate-atropine (LOMOTIL) 2.5-0.025 MG tablet Take 1 tablet by mouth 4 (four) times daily as needed for diarrhea or loose stools. (Patient not taking: Reported on 08/24/2019) 30 tablet 0  .  famotidine (PEPCID) 20 MG tablet Take 1 tablet (20 mg total) by mouth 2 (two) times daily. (Patient not taking: Reported on 08/24/2019) 60 tablet 0  . metFORMIN (GLUCOPHAGE) 500 MG tablet HOLD FOR NOW (Patient not taking: Reported on 08/24/2019) 60 tablet 6   No facility-administered medications prior to visit.      Review of Systems  Constitutional: Negative for diaphoresis, fatigue and fever.  HENT: Negative for congestion and sore throat.   Eyes: Negative.   Respiratory: Positive for shortness of breath. Negative for cough.   Cardiovascular: Negative for chest pain, palpitations and leg swelling.  Gastrointestinal: Negative for abdominal distention, abdominal pain, anal bleeding, blood in stool, constipation, diarrhea, nausea, rectal pain and vomiting.  Genitourinary: Negative.   Musculoskeletal: Positive for myalgias.  Skin: Negative.   Neurological: Positive for dizziness and light-headedness. Negative for headaches.  Psychiatric/Behavioral: Negative for decreased concentration, dysphoric mood and suicidal ideas.       Objective:   Physical Exam Vitals:   08/24/19 1014  BP: 138/86  Pulse: 98  Resp: 18  Temp: 98 F (36.7 C)  SpO2: 96%  Weight: (!) 308 lb (139.7 kg)  Height: 5' 9"  (1.753 m)    Gen: This is an ill-appearing obese male in no acute distress but also extremely weak  ENT: No lesions,  mouth clear,  oropharynx clear, no postnasal drip  Neck: No JVD, no TMG, no carotid bruits  Lungs: No use of accessory muscles, no dullness to percussion, clear without rales or rhonchi  Cardiovascular: RRR, heart sounds normal, no murmur or gallops, no peripheral edema  Abdomen: Abdomen is much improved with no evidence of rebound or guarding  Musculoskeletal: No deformities, no cyanosis or clubbing  Neuro: alert, non focal  Skin: Warm, no lesions or rashes    Assessment &  Plan:  I personally reviewed all images and lab data in the [facility] system as well as any outside  material available during this office visit and agree with the  radiology impressions.   history of Hypertension Orthostatic hypotension resolved  Patient resume losartan HCT and amlodipine as prescribed  Acute superficial gastritis without hemorrhage Work-up shows acute gastritis and duodenitis without H. Pylori  Patient continues Carafate 4 times daily for 2 weeks total and continue twice daily proton pump inhibitor and follow-up with gastroenterology  Type 2 diabetes mellitus with diabetic polyneuropathy, without long-term current use of insulin (HCC) Mild hyperglycemia contributing to dizziness will resume Metformin twice daily  Abnormal MRI of the head History of dizziness previously worked up showing an abnormal MRI which neurology did not comment upon with hemorrhagic changes in the left occipital lobe  Plan to refer back to neurology for further evaluation   Diagnoses and all orders for this visit:  Acute superficial gastritis without hemorrhage  Type 2 diabetes mellitus with diabetic polyneuropathy, without long-term current use of insulin (HCC) -     POCT glucose (manual entry) -     metFORMIN (GLUCOPHAGE) 500 MG tablet; Take 1 tablet (500 mg total) by mouth 2 (two) times daily with a meal.  Dizziness -     Ambulatory referral to Neurology  Abnormal MRI of the head -     Ambulatory referral to Neurology  OSA (obstructive sleep apnea)  Essential hypertension  Heme positive stool  Other orders -     losartan-hydrochlorothiazide (HYZAAR) 100-12.5 MG tablet; Take 1 tablet by mouth daily.

## 2019-08-24 NOTE — Progress Notes (Signed)
Here for hospital f /U

## 2019-08-24 NOTE — Patient Instructions (Addendum)
Keep appointment with Gastroenterology  Call sleep physician for appointment to discuss cpap machine  A referral to neurology will be made  Follow diet as below  Stay on new stomach medications  Resume metformin 500mg  twice daily  See Dr [name] in one month  An extension on work note given   Gastritis, Adult  Gastritis is swelling (inflammation) of the stomach. Gastritis can develop quickly (acute). It can also develop slowly over time (chronic). It is important to get help for this condition. If you do not get help, your stomach can bleed, and you can get sores (ulcers) in your stomach. What are the causes? This condition may be caused by:  Germs that get to your stomach.  Drinking too much alcohol.  Medicines you are taking.  Too much acid in the stomach.  A disease of the intestines or stomach.  Stress.  An allergic reaction.  Crohn's disease.  Some cancer treatments (radiation). Sometimes the cause of this condition is not known. What are the signs or symptoms? Symptoms of this condition include:  Pain in your stomach.  A burning feeling in your stomach.  Feeling sick to your stomach (nauseous).  Throwing up (vomiting).  Feeling too full after you eat.  Weight loss.  Bad breath.  Throwing up blood.  Blood in your poop (stool). How is this diagnosed? This condition may be diagnosed with:  Your medical history and symptoms.  A physical exam.  Tests. These can include: ? Blood tests. ? Stool tests. ? A procedure to look inside your stomach (upper endoscopy). ? A test in which a sample of tissue is taken for testing (biopsy). How is this treated? Treatment for this condition depends on what caused it. You may be given:  Antibiotic medicine, if your condition was caused by germs.  H2 blockers and similar medicines, if your condition was caused by too much acid. Follow these instructions at home: Medicines  Take over-the-counter and  prescription medicines only as told by your doctor.  If you were prescribed an antibiotic medicine, take it as told by your doctor. Do not stop taking it even if you start to feel better. Eating and drinking   Eat small meals often, instead of large meals.  Avoid foods and drinks that make your symptoms worse.  Drink enough fluid to keep your pee (urine) pale yellow. Alcohol use  Do not drink alcohol if: ? Your doctor tells you not to drink. ? You are pregnant, may be pregnant, or are planning to become pregnant.  If you drink alcohol: ? Limit your use to:  0-1 drink a day for women.  0-2 drinks a day for men. ? Be aware of how much alcohol is in your drink. In the [place] one drink equals one 12 oz bottle of beer (355 mL), one 5 oz glass of wine (148 mL), or one 1 oz glass of hard liquor (44 mL). General instructions  Talk with your doctor about ways to manage stress. You can exercise or do deep breathing, meditation, or yoga.  Do not smoke or use products that have nicotine or tobacco. If you need help quitting, ask your doctor.  Keep all follow-up visits as told by your doctor. This is important. Contact a doctor if:  Your symptoms get worse.  Your symptoms go away and then come back. Get help right away if:  You throw up blood or something that looks like coffee grounds.  You have black or dark red poop.  You throw up any time you try to drink fluids.  Your stomach pain gets worse.  You have a fever.  You do not feel better after one week. Summary  Gastritis is swelling (inflammation) of the stomach.  You must get help for this condition. If you do not get help, your stomach can bleed, and you can get sores (ulcers).  This condition is diagnosed with medical history, physical exam, or tests.  You can be treated with medicines for germs or medicines to block too much acid in your stomach. This information is not intended to replace advice given to you by  your health care provider. Make sure you discuss any questions you have with your health care provider. Document Revised: 08/18/2017 Document Reviewed: 08/18/2017 Elsevier Patient Education  [employer].

## 2019-08-24 NOTE — Assessment & Plan Note (Signed)
History of dizziness previously worked up showing an abnormal MRI which neurology did not comment upon with hemorrhagic changes in the left occipital lobe  Plan to refer back to neurology for further evaluation

## 2019-08-24 NOTE — Assessment & Plan Note (Signed)
Mild hyperglycemia contributing to dizziness will resume Metformin twice daily

## 2019-09-06 ENCOUNTER — Ambulatory Visit (INDEPENDENT_AMBULATORY_CARE_PROVIDER_SITE_OTHER): Payer: Self-pay | Admitting: Neurology

## 2019-09-06 ENCOUNTER — Encounter: Payer: Self-pay | Admitting: Neurology

## 2019-09-06 ENCOUNTER — Other Ambulatory Visit: Payer: Self-pay

## 2019-09-06 VITALS — BP 130/87 | HR 98 | Ht 69.0 in | Wt 309.6 lb

## 2019-09-06 DIAGNOSIS — R42 Dizziness and giddiness: Secondary | ICD-10-CM

## 2019-09-06 DIAGNOSIS — H9313 Tinnitus, bilateral: Secondary | ICD-10-CM

## 2019-09-06 NOTE — Patient Instructions (Signed)
1. Schedule MRI brain with and without contrast  2. If MRI looks fine, we will plan for a spinal tap  3. Follow-up after tests, call for any changes

## 2019-09-06 NOTE — Progress Notes (Signed)
NEUROLOGY FOLLOW UP OFFICE NOTE  Juan Kline [id number] July 06, 1970  HISTORY OF PRESENT ILLNESS: I had the pleasure of seeing Juan Kline in follow-up in the neurology clinic on 09/06/2019.  The patient was last seen almost a year ago. He initially presented for generalized malaise, tinnitus, and shakiness. MRI brain with and without contrast done 11/2017 did not show any acute changes. There was a small chronic microhemorrhage in the left occipital lobe which is non-specific and would not cause symptoms. 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 Symptoms had resolved for the most part, then he presented last July 2020 with recurrence of dizziness, where he is lightheaded, flustered, feeling like he would pass out. He would feel short of breath with tunnel vision and difficult to talk. He feels tired after.   He reports that he was not feeling so bad until he got his first Covid vaccine at the beginning of April. He states he can pinpoint when his symptoms started to worsen. He had diarrhea, fatigue, headaches, muscle aches and severe fatigue. He had a GI workup for the diarrhea and epigastric pain, which have improved. He however continues to feel a constant sensation worse when standing, sitting, walking. Supine position makes him feel somewhat better. He feels flushed, dizziness, nauseated, with a pulse throbbing in his ears and pressure in his head. Sometimes there is a slight pressure in his head, neck, shoulders. His ears are ringing all the time. He has not been able to function much and has lost 40lb due to lack of appetite. He states he "never feels good," symptoms are bearable and ease up when supine or bending his head forward. If he feels somewhat okay, he starts moving around and feels worse. He reports his BP and glucose levels have been good. He has pricking pains in  different places, occasional numbness around his mouth. He denies any falls.   History on Initial Assessment 11/12/2017: This is a 49 year old right-handed man with a history of hypertension, anxiety, presenting for evaluation of recurrent symptoms that started around June 2018. He states symptoms started Father's Day last year, he suddenly felt strange, shaky, and scared. He noticed ringing in both ears and called EMS. He reports BP was "sky high" the first time, 200/100 per ER notes, in the ER BP was 147/112. He was not taking his BP medications as prescribed. Bloodwork was unremarkable, EKG showed normal sinus rhythm, incomplete RBBB. He was given prn Ativan for anxiety. He was back in the ER the next day for continued symptoms, he described a cold feeling coming over him, anxiety, BP was 166/118. EKG showed NSR, left anterior fascicular block. Since then, he has been to the ER 4 more times for chest pain. He states that he overall does not feel well. Initially he would go a few months without symptoms, but now he has been to the ER every other month. A lot of times in the morning he wakes up and starts hearing a high pitched ringing and ears feel congested. He feels a burning in his head like he has to put a cold rag on it. Then his arms become shaky and he feels warm down his arms. Legs are not affected. He sometimes feels a throbbing inside his head like his pulse, with a little pressure but not pain. He feels shaky and chilly, no nausea/vomiting. One  day he recalls difficulty looking at certain colors, he could not stand to look at it because it looked bright. Another time he felt facial numbness and had difficulty talking lasting a couple of hours. When more severe, he would go to the ER. He would feel wiped out and sleepy afterwards. He states "I never feel good any day, head never feels right." Symptoms are worse when sitting up, he feels normal when supine. He denies any focal weakness, no confusion,  olfactory/gustatory hallucinations, myoclonic jerks. He has been diagnosed with severe sleep apnea but is awaiting his CPAP machine due to lack of insurance.   He denies any diplopia, dysarthria/dysphagia, back pain, bowel/bladder dysfunction. He has some neck pain. He has always been an anxious person but was only started on Zoloft a few months ago. He has been unemployed for 10 years due to "lots of different things." His mother reports a few febrile convulsions when he was 89 or 49 years old, no seizure medications started. Otherwise he had a normal birth and early development.  There is no history of CNS infections such as meningitis/encephalitis, significant traumatic brain injury, neurosurgical procedures, or family history of seizures.    PAST MEDICAL HISTORY: Past Medical History:  Diagnosis Date  . Chronic foot ulcer ([place]) admitted 03/02/2014   medial border first metatarsal left foot  . Diabetes ([place])   . Diabetic foot infection ([place])   . GAD (generalized anxiety disorder)   . Hyperlipidemia   . Hypertension Dx 2009  . Mild depression ([place])   . OSA (obstructive sleep apnea)    CPAP  . Osteomyelitis left first metatarsal 03/02/2014  . Sleep apnea   . Venous stasis ulcer of left lower extremity ([place])    [name] 03/02/2014    MEDICATIONS: Current Outpatient Medications on File Prior to Visit  Medication Sig Dispense Refill  . albuterol (VENTOLIN HFA) 108 (90 Base) MCG/ACT inhaler Inhale 2 puffs into the lungs every 6 (six) hours as needed for wheezing or shortness of breath. 6.7 g 11  . amLODipine (NORVASC) 10 MG tablet Take 1 tablet (10 mg total) by mouth daily. 30 tablet 6  . atorvastatin (LIPITOR) 40 MG tablet Take 1 tablet (40 mg total) by mouth daily. 90 tablet 3  . Blood Glucose Monitoring Suppl (TRUE METRIX METER) w/Device KIT Use as directed 1 kit 0  . glucose blood (TRUE METRIX BLOOD GLUCOSE TEST) test strip Use as instructed 100 each 12  . losartan-hydrochlorothiazide  (HYZAAR) 100-12.5 MG tablet Take 1 tablet by mouth daily. 90 tablet 3  . sertraline (ZOLOFT) 100 MG tablet Take 1.5 tablets (150 mg total) by mouth daily. 45 tablet 5  . tiotropium (SPIRIVA) 18 MCG inhalation capsule Place 1 capsule (18 mcg total) into inhaler and inhale daily. (Patient taking differently: Place 18 mcg into inhaler and inhale daily as needed (SOB/Wheezing). ) 30 capsule 6  . TRUEPLUS LANCETS 28G MISC Use as directed 100 each 2  . metFORMIN (GLUCOPHAGE) 500 MG tablet Take 1 tablet (500 mg total) by mouth 2 (two) times daily with a meal. (Patient not taking: Reported on 09/06/2019) 60 tablet 6  . omeprazole (PRILOSEC OTC) 20 MG tablet Take 2 tablets (40 mg total) by mouth 2 (two) times daily. (Patient not taking: Reported on 09/06/2019) 224 tablet 0   No current facility-administered medications on file prior to visit.    ALLERGIES: Allergies  Allergen Reactions  . Lyrica [Pregabalin] Anxiety    FAMILY HISTORY: Family History  Problem Relation  Age of Onset  . Hypertension Mother   . Hyperlipidemia Mother   . Diabetes Mother   . Kidney disease Mother   . Hypertension Father   . Hyperlipidemia Father   . Diabetes Brother   . Hypertension Brother   . Hyperlipidemia Brother   . Heart disease Maternal Grandmother   . COPD Maternal Grandmother   . Diabetes Maternal Grandfather   . Heart attack Paternal Grandmother   . Heart disease Paternal Grandfather     SOCIAL HISTORY: Social History   Socioeconomic History  . Marital status: Single    Spouse name: Not on file  . Number of children: 0  . Years of education: Not on file  . Highest education level: Not on file  Occupational History  . Not on file  Tobacco Use  . Smoking status: Former Smoker    Packs/day: 1.50    Years: 26.00    Pack years: 39.00    Types: E-cigarettes, Cigarettes    Start date: 1989    Quit date: 02/16/2014    Years since quitting: 5.5  . Smokeless tobacco: Never Used  Substance and  Sexual Activity  . Alcohol use: Not Currently    Comment: 03/02/2014 "I rarely drink"  . Drug use: No  . Sexual activity: Not Currently  Other Topics Concern  . Not on file  Social History Narrative   Pt lives in 1 story home with his mother and step father   9th grade education   Last occupation was with [employer] in 2009      Unemployed at this time- thinking of filing for disability      Right handed      Social Determinants of Health   Financial Resource Strain:   . Difficulty of Paying Living Expenses:   Food Insecurity:   . Worried About Charity fundraiser in the Last Year:   . Arboriculturist in the Last Year:   Transportation Needs:   . Film/video editor (Medical):   Marland Kitchen Lack of Transportation (Non-Medical):   Physical Activity:   . Days of Exercise per Week:   . Minutes of Exercise per Session:   Stress:   . Feeling of Stress :   Social Connections:   . Frequency of Communication with Friends and Family:   . Frequency of Social Gatherings with Friends and Family:   . Attends Religious Services:   . Active Member of Clubs or Organizations:   . Attends Archivist Meetings:   Marland Kitchen Marital Status:   Intimate Partner Violence:   . Fear of Current or Ex-Partner:   . Emotionally Abused:   Marland Kitchen Physically Abused:   . Sexually Abused:     PHYSICAL EXAM: Vitals:   09/06/19 1533  BP: 130/87  Pulse: 98  SpO2: 94%   General: No acute distress Head:  Normocephalic/atraumatic Skin/Extremities: No rash, no edema Neurological Exam: alert and oriented to person, place, and time. No aphasia or dysarthria. Fund of knowledge is appropriate.  Recent and remote memory are intact.  Attention and concentration are normal.    Cranial nerves: Pupils equal, round, reactive to light. Extraocular movements intact with no nystagmus. Visual fields full. Facial sensation intact. No facial asymmetry.  Motor: Bulk and tone normal, muscle strength 5/5 throughout with no pronator  drift.  Sensation to light touch, temperature and vibration intact.  No extinction to double simultaneous stimulation.  Deep tendon reflexes 2+ throughout, toes downgoing.  Finger to nose testing  intact with endpoint tremor bilaterally.  Gait slow and cautious, no ataxia   IMPRESSION: This is a 49 yo RH man with a history of hypertension, anxiety, sleep apnea on CPAP, who presented a year ago with recurrent episodes where he would feel strange, with high pitched tinnitus, burning in his head, shakiness. MRI brain in 2019 and 48-hour EEG were normal. Symptoms had abated, however over the past month, he started having an overall feeling of malaise with flushed feeling, dizziness, nausea, pulsatile tinnitus, head pressure, that appears to have a positional component, better when supine. His neurological exam is non-focal, etiology of symptoms unclear. With positional component, will do an MRI brain with and without contrast to assess for any evidence of intracranial hypotension. We may consider a spinal tap to measure opening pressure. Follow-up after tests, call for any changes.  Thank you for allowing me to participate in his care.  Please do not hesitate to call for any questions or concerns.   Ellouise Newer, M.D.   CC: Dr. Joya Gaskins

## 2019-09-07 NOTE — Progress Notes (Signed)
Gccn card no auth required

## 2019-09-08 ENCOUNTER — Ambulatory Visit: Payer: Self-pay | Admitting: Nurse Practitioner

## 2019-09-13 MED FILL — ?ATORVASTATIN 40MG TABLET: 40 | 30 days supply | Qty: 30 | Fill #6

## 2019-09-13 MED FILL — LOSARTAN-HCTZ 100-12.5 MG T: 100-12.5 | 30 days supply | Qty: 30 | Fill #6

## 2019-09-13 MED FILL — SERTRALINE HCL 100 MG TAB: 100 | 30 days supply | Qty: 45 | Fill #2

## 2019-09-13 MED FILL — ?AMLODIPINE BESYL 10MG TABL: 10 | 30 days supply | Qty: 30 | Fill #6

## 2019-09-26 ENCOUNTER — Other Ambulatory Visit: Payer: Self-pay

## 2019-09-26 ENCOUNTER — Encounter: Payer: Self-pay | Admitting: Internal Medicine

## 2019-09-26 ENCOUNTER — Ambulatory Visit: Payer: Self-pay | Attending: Internal Medicine | Admitting: Internal Medicine

## 2019-09-26 VITALS — BP 125/84 | HR 102 | Temp 98.2°F | Resp 16 | Wt 308.2 lb

## 2019-09-26 DIAGNOSIS — I10 Essential (primary) hypertension: Secondary | ICD-10-CM

## 2019-09-26 DIAGNOSIS — H9313 Tinnitus, bilateral: Secondary | ICD-10-CM

## 2019-09-26 DIAGNOSIS — R42 Dizziness and giddiness: Secondary | ICD-10-CM

## 2019-09-26 DIAGNOSIS — E1142 Type 2 diabetes mellitus with diabetic polyneuropathy: Secondary | ICD-10-CM

## 2019-09-26 LAB — GLUCOSE, POCT (MANUAL RESULT ENTRY): POC Glucose: 116 mg/dl — AB (ref 70–99)

## 2019-09-26 MED ORDER — TRUE METRIX BLOOD GLUCOSE TEST VI STRP: ORAL_STRIP | 12 refills | Status: DC

## 2019-09-26 NOTE — Patient Instructions (Signed)
Meniere Disease  Meniere disease is an inner ear disorder. It causes attacks of a spinning sensation (vertigo), dizziness, and ringing in the ear (tinnitus). It also causes hearing loss and a feeling of fullness or pressure in the ear. This is a lifelong condition, and it may get worse over time. You may have drop attacks or severe dizziness that makes you fall. A drop attack is when you suddenly fall without losing consciousness and you quickly recover after a few seconds or minutes. What are the causes? This condition is caused by having too much of the fluid that is in your inner ear (endolymph). When fluid builds up in your inner ear, it affects the nerves that control balance and hearing. The reason for the fluid buildup is not known. Possible causes include:  Allergies.  An abnormal reaction of the body's defense system (autoimmune disease).  Viral infection of the inner ear.  Head injury. What increases the risk? You are more likely to develop this condition if:  You are older than age 40.  You have a family history of Meniere disease.  You have a history of autoimmune disease.  You have a history of migraine headaches. What are the signs or symptoms? Symptoms of this condition can come and go and may last for up to 4 hours at a time. Symptoms usually start in one ear. They may become more frequent and eventually involve both ears. Symptoms can include:  Fullness and pressure in your ear.  Roaring or ringing in your ear.  Vertigo and loss of balance.  Dizziness.  Decreased hearing.  Nausea and vomiting. How is this diagnosed? This condition is diagnosed based on:  A physical exam.  Tests , such as: ? A hearing test (audiogram). ? An electronystagmogram. This tests your balance nerve (vestibular nerve). ? Imaging studies of your inner ear, such as CT scan or MRI. ? Other balance tests, such as rotational or balance platform tests. How is this treated? There is  no cure for this condition, but treatment can help to manage your symptoms. Treatment may include:  A low-salt diet. Limiting salt may help to reduce fluid in the body and relieve symptoms.  Oral or injected medicines to reduce or control: ? Vertigo. ? Nausea. ? Fluid retention. ? Dizziness.  Use of an air pressure pulse generator. This is a machine that sends small pressure pulses into your ear canal.  Hearing aids.  Inner ear surgery. This is rare. When you have symptoms, it can be helpful to lie down on a flat surface and focus your eyes on one object that does not move. Try to stay in that position until your symptoms go away. Follow these instructions at home: Eating and drinking  Eat the same amount of food at the same time every day, including snacks.  Do not skip meals.  Avoid caffeine.  Drink enough fluids to keep your urine clear or pale yellow.  Limit alcoholic drinks to one drink a day for non-pregnant women and 2 drinks a day for men. One drink equals 12 oz of beer, 5 oz of wine, or 1 oz of hard liquor.  Limit the salt (sodium) in your diet as told by your health care provider. Check ingredients and nutrition facts on packaged foods and beverages.  Do not eat foods that contain monosodium glutamate (MSG). General instructions  Do not use any products that contain nicotine or tobacco, such as cigarettes and e-cigarettes. If you need help quitting, ask your   health care provider.  Take over-the-counter and prescription medicines only as told by your health care provider.  Find ways to reduce or avoid stress. If you need help with this, ask your health care provider.  Do not drive if you have vertigo or dizziness. Contact a health care provider if:  You have symptoms that last longer than 4 hours.  You have new or worse symptoms. Get help right away if:  You have been vomiting for 24 hours.  You cannot keep fluids down.  You have chest pain or trouble  breathing. Summary  Meniere disease is an inner ear disorder. It causes attacks of a spinning sensation (vertigo), dizziness, and ringing in the ear (tinnitus). It also causes hearing loss and a feeling of fullness or pressure in the ear.  Symptoms of this condition can come and go and may last for up to 4 hours at a time.  When you have symptoms, it can be helpful to lie down on a flat surface and focus your eyes on one object that does not move. Try to stay in that position until your symptoms go away. This information is not intended to replace advice given to you by your health care provider. Make sure you discuss any questions you have with your health care provider. Document Revised: 03/13/2017 Document Reviewed: 02/20/2016 Elsevier Patient Education  2020 [employer] 

## 2019-09-26 NOTE — Progress Notes (Signed)
Patient ID: Juan Kline, male    DOB: 11/17/1970  MRN: [id number]  CC: Diabetes, Hypertension, and FMLA   Subjective: Juan Kline is a 49 y.o. male who presents for chronic ds management His concerns today include:  HTN, HL,DM type 2with neuropathy,anxiety, chronic fatigue, severe OSA on BiPAP, vapes, mix COPD, obesity  Since last visit with me in March, he has had several ER visits and one hospital admission.  Seen in the ER May 1 for atypical chest pain.  Work-up was unrevealing.  Seen again 08/14/2019 with diarrhea and epigastric pain.  CAT scan of the abdomen revealed no acute abnormality.  He did have a fat-containing ventral abdominal hernia.  He was discharged with Bentyl and Pepcid.  He was then admitted to the hospital on 08/17/2019 from the office for ongoing epigastric pain and heme positive stool.  However his hemoglobin remained stable during hospitalization.  Seen by GI and had EGD.  This revealed esophagitis and diffuse gastritis.  PPI twice daily recommended.  Today he reports that he "started having swimmy head again."  He describes this as slight lightheadedness.  There is a pressure feeling in his head and feels like his ears are stopped up.  He has tinnitus.  No dec hearing or pain in the ears.  Symptoms have been constantly present but worse than what he had been experiencing before.  He feels better laying down.  Has not work or driven in 2.5 mths.  Saw neurologist in f/u.  Has MRI scheduled for later this mth.  States he was told by the neurologist that if the MRI is negative he may need a spinal tap. Similar episodes in the past felt to be due to anxiety.  Still taking Zoloft. -He has been off work since April.  He has FMLA paper with him today and is requesting to be off until the end of July hoping that by that time work-up would have been completed and he would be doing better. -He wants to know whether he would qualify for disability.  Had appt with GI for last mth.   Rescheduled for next wk.  Plans to post-pone until dizziness and feeling of pressure in the head has been worked up -Reports loss of appetite during the time that he was sick requiring hospitalization.  He has lost 30 pounds.  His appetite is slowly coming back.    DM:  BS 100-125 before and after meal.  He stop Metformin x 2 mths because BS good.    HTN: compliant with medications and low-salt diet.  No swelling in the legs.. Patient Active Problem List   Diagnosis Date Noted  . Abnormal MRI of the head 08/24/2019  . Acute superficial gastritis without hemorrhage 08/24/2019  . COPD mixed type ([place]) 04/04/2019  . Positive for macroalbuminuria 03/06/2019  . Vapes nicotine containing substance 03/03/2019  . Class 3 severe obesity due to excess calories without serious comorbidity with body mass index (BMI) of 45.0 to 49.9 in adult [facility]) 03/03/2019  . Hyperlipidemia associated with type 2 diabetes mellitus ([place]) 03/03/2019  . Type 2 diabetes mellitus with diabetic polyneuropathy, without long-term current use of insulin ([place]) 04/13/2018  . Mild depression ([place]) 04/01/2018  . OSA (obstructive sleep apnea) 10/26/2017  . Anxiety disorder 10/26/2017  . Peripheral neuropathy 12/12/2016  . GAD (generalized anxiety disorder) 05/11/2015  . Former smoker 05/11/2015  . Other hyperlipidemia 05/22/2014  . Vitamin D insufficiency 05/22/2014  . Numbness and tingling of foot 05/19/2014  .  Venous stasis dermatitis of both lower extremities 05/19/2014  . history of Hypertension 03/02/2014  . Morbid obesity ([place]) 03/02/2014     Current Outpatient Medications on File Prior to Visit  Medication Sig Dispense Refill  . albuterol (VENTOLIN HFA) 108 (90 Base) MCG/ACT inhaler Inhale 2 puffs into the lungs every 6 (six) hours as needed for wheezing or shortness of breath. 6.7 g 11  . amLODipine (NORVASC) 10 MG tablet Take 1 tablet (10 mg total) by mouth daily. 30 tablet 6  . atorvastatin (LIPITOR) 40 MG tablet  Take 1 tablet (40 mg total) by mouth daily. 90 tablet 3  . Blood Glucose Monitoring Suppl (TRUE METRIX METER) w/Device KIT Use as directed 1 kit 0  . losartan-hydrochlorothiazide (HYZAAR) 100-12.5 MG tablet Take 1 tablet by mouth daily. 90 tablet 3  . omeprazole (PRILOSEC OTC) 20 MG tablet Take 2 tablets (40 mg total) by mouth 2 (two) times daily. (Patient not taking: Reported on 09/06/2019) 224 tablet 0  . sertraline (ZOLOFT) 100 MG tablet Take 1.5 tablets (150 mg total) by mouth daily. 45 tablet 5  . tiotropium (SPIRIVA) 18 MCG inhalation capsule Place 1 capsule (18 mcg total) into inhaler and inhale daily. (Patient taking differently: Place 18 mcg into inhaler and inhale daily as needed (SOB/Wheezing). ) 30 capsule 6  . TRUEPLUS LANCETS 28G MISC Use as directed 100 each 2   No current facility-administered medications on file prior to visit.    Allergies  Allergen Reactions  . Lyrica [Pregabalin] Anxiety    Social History   Socioeconomic History  . Marital status: Single    Spouse name: Not on file  . Number of children: 0  . Years of education: Not on file  . Highest education level: Not on file  Occupational History  . Not on file  Tobacco Use  . Smoking status: Former Smoker    Packs/day: 1.50    Years: 26.00    Pack years: 39.00    Types: E-cigarettes, Cigarettes    Start date: 1989    Quit date: 02/16/2014    Years since quitting: 5.6  . Smokeless tobacco: Never Used  Vaping Use  . Vaping Use: Former  . Start date: 04/15/2015  . Quit date: 04/11/2019  . Substances: Nicotine  Substance and Sexual Activity  . Alcohol use: Not Currently    Comment: 03/02/2014 "I rarely drink"  . Drug use: No  . Sexual activity: Not Currently  Other Topics Concern  . Not on file  Social History Narrative   Pt lives in 1 story home with his mother and step father   9th grade education   Last occupation was with [employer] in 2009      Unemployed at this time- thinking of filing for  disability      Right handed      Social Determinants of Health   Financial Resource Strain:   . Difficulty of Paying Living Expenses:   Food Insecurity:   . Worried About Charity fundraiser in the Last Year:   . Arboriculturist in the Last Year:   Transportation Needs:   . Film/video editor (Medical):   Marland Kitchen Lack of Transportation (Non-Medical):   Physical Activity:   . Days of Exercise per Week:   . Minutes of Exercise per Session:   Stress:   . Feeling of Stress :   Social Connections:   . Frequency of Communication with Friends and Family:   . Frequency of  Social Gatherings with Friends and Family:   . Attends Religious Services:   . Active Member of Clubs or Organizations:   . Attends Archivist Meetings:   Marland Kitchen Marital Status:   Intimate Partner Violence:   . Fear of Current or Ex-Partner:   . Emotionally Abused:   Marland Kitchen Physically Abused:   . Sexually Abused:     Family History  Problem Relation Age of Onset  . Hypertension Mother   . Hyperlipidemia Mother   . Diabetes Mother   . Kidney disease Mother   . Hypertension Father   . Hyperlipidemia Father   . Diabetes Brother   . Hypertension Brother   . Hyperlipidemia Brother   . Heart disease Maternal Grandmother   . COPD Maternal Grandmother   . Diabetes Maternal Grandfather   . Heart attack Paternal Grandmother   . Heart disease Paternal Grandfather     Past Surgical History:  Procedure Laterality Date  . BIOPSY  08/18/2019   Procedure: BIOPSY;  Surgeon: Thornton Park, MD;  Location: [facility];  Service: Gastroenterology;;  . ESOPHAGOGASTRODUODENOSCOPY (EGD) WITH PROPOFOL N/A 08/18/2019   Procedure: ESOPHAGOGASTRODUODENOSCOPY (EGD) WITH PROPOFOL;  Surgeon: Thornton Park, MD;  [facility];  Service: Gastroenterology;  Laterality: N/A;  . NO PAST SURGERIES      ROS: Review of Systems Negative except as stated above  PHYSICAL EXAM: BP 125/84   Pulse (!) 102   Temp 98.2 F  (36.8 C)   Resp 16   Wt (!) 308 lb 3.2 oz (139.8 kg)   SpO2 91%   BMI 45.51 kg/m   Wt Readings from Last 3 Encounters:  09/26/19 (!) 308 lb 3.2 oz (139.8 kg)  09/06/19 (!) 309 lb 9.6 oz (140.4 kg)  08/24/19 (!) 308 lb (139.7 kg)    Physical Exam   General appearance - alert, well appearing, and in no distress Mental status - normal mood, behavior, speech, dress, motor activity, and thought processes Eyes - pupils equal and reactive, extraocular eye movements intact Ears: Both ear canal and tympanic membrane within normal limits. Mouth - mucous membranes moist, pharynx normal without lesions Neck - supple, no significant adenopathy Chest - clear to auscultation, no wheezes, rales or rhonchi, symmetric air entry Heart - normal rate, regular rhythm, normal S1, S2, no murmurs, rubs, clicks or gallops Neurological -cranial nerves are grossly intact.  Power 5/5 throughout.  Gross sensation intact.  Slight swaying of the trunk with Romberg.  Gait is stable.  He walks unassisted extremities - peripheral pulses normal, no pedal edema, no clubbing or cyanosis  CMP Latest Ref Rng & Units 08/18/2019 08/17/2019 08/15/2019  Glucose 70 - 99 mg/dL 124(H) 129(H) 184(H)  BUN 6 - 20 mg/dL 16 21(H) 23(H)  Creatinine 0.61 - 1.24 mg/dL 0.84 0.87 0.86  Sodium 135 - 145 mmol/L 138 136 136  Potassium 3.5 - 5.1 mmol/L 3.5 3.5 3.6  Chloride 98 - 111 mmol/L 100 98 98  CO2 22 - 32 mmol/L 26 25 23   Calcium 8.9 - 10.3 mg/dL 8.5(L) 9.0 9.5  Total Protein 6.5 - 8.1 g/dL - 7.8 7.9  Total Bilirubin 0.3 - 1.2 mg/dL - 0.6 0.3  Alkaline Phos 38 - 126 U/L - 56 56  AST 15 - 41 U/L - 28 34  ALT 0 - 44 U/L - 36 33   Lipid Panel     Component Value Date/Time   CHOL 200 (H) 03/04/2019 1107   TRIG 255 (H) 03/04/2019 1107   HDL  46 03/04/2019 1107   CHOLHDL 4.3 03/04/2019 1107   CHOLHDL 5.6 (H) 02/28/2016 1244   VLDL 65 (H) 02/28/2016 1244   LDLCALC 110 (H) 03/04/2019 1107    CBC    Component Value Date/Time    WBC 7.6 08/18/2019 0408   RBC 4.67 08/18/2019 0408   HGB 12.9 (L) 08/18/2019 0408   HGB 14.5 08/10/2019 1146   HCT 39.8 08/18/2019 0408   HCT 42.4 08/10/2019 1146   PLT 245 08/18/2019 0408   PLT 301 08/10/2019 1146   MCV 85.2 08/18/2019 0408   MCV 81 08/10/2019 1146   MCH 27.6 08/18/2019 0408   MCHC 32.4 08/18/2019 0408   RDW 13.3 08/18/2019 0408   RDW 13.4 08/10/2019 1146   LYMPHSABS 2.4 08/14/2019 0141   LYMPHSABS 2.0 08/10/2019 1146   MONOABS 0.8 08/14/2019 0141   EOSABS 0.2 08/14/2019 0141   EOSABS 0.1 08/10/2019 1146   BASOSABS 0.0 08/14/2019 0141   BASOSABS 0.0 08/10/2019 1146   Lab Results  Component Value Date   HGBA1C 6.8 (H) 08/17/2019    ASSESSMENT AND PLAN:  1. Type 2 diabetes mellitus with diabetic polyneuropathy, without long-term current use of insulin (HCC) At goal off Metformin.  Weight loss though unintentional likely contributing.  Encourage healthy eating habits. - POCT glucose (manual entry) - glucose blood (TRUE METRIX BLOOD GLUCOSE TEST) test strip; Use as instructed  Dispense: 100 each; Refill: 12  2. Essential hypertension At goal.  Continue current medications and low-salt diet  3. Tinnitus of both ears 4. Lightheadedness -Differential diagnosis include cerebellar abnormality vs Mnire's disease vs anxiety.  Cannot rule out possibility of overlay for secondary gain of wanting disability.  He will keep the appointment for the MRI later this month and subsequent follow-up with neurology.  I have also referred to ENT given the possibility of Mnire's.  I will complete FMLA keeping him out of work until the end of July.  He has follow-up appointment with me at the end of next month. - Ambulatory referral to ENT   Patient was given the opportunity to ask questions.  Patient verbalized understanding of the plan and was able to repeat key elements of the plan.   Orders Placed This Encounter  Procedures  . Ambulatory referral to ENT  . POCT glucose  (manual entry)     Requested Prescriptions   Signed Prescriptions Disp Refills  . glucose blood (TRUE METRIX BLOOD GLUCOSE TEST) test strip 100 each 12    Sig: Use as instructed    No follow-ups on file.  Karle Plumber, MD, FACP

## 2019-09-27 MED FILL — TRUE METRIX TEST STRIP: 100 days supply | Qty: 100 | Fill #0

## 2019-09-30 ENCOUNTER — Telehealth (INDEPENDENT_AMBULATORY_CARE_PROVIDER_SITE_OTHER): Payer: Self-pay | Admitting: Psychiatry

## 2019-09-30 ENCOUNTER — Other Ambulatory Visit: Payer: Self-pay

## 2019-09-30 DIAGNOSIS — Z0289 Encounter for other administrative examinations: Secondary | ICD-10-CM

## 2019-09-30 DIAGNOSIS — F411 Generalized anxiety disorder: Secondary | ICD-10-CM

## 2019-09-30 NOTE — Progress Notes (Signed)
BH MD/PA/NP OP Progress Note  09/30/2019 10:40 AM Juan Kline  MRN:  [id number] Interview was conducted by phone and I verified that I was speaking with the correct person using two identifiers. I discussed the limitations of evaluation and management by telemedicine and  the availability of in person appointments. Patient expressed understanding and agreed to proceed. Patient location - home; physician - home office.  Chief Complaint: "I haven't been feeling well last few months".  HPI: 49 yo single male with hx of anxiety, some situational depression and occasional spells/episodes of shaking, tunnel vision, SOB, dizziness, lightheadedness. Patient has been doing fairly well on sertraline but lately he again started to complain of dizziness, headaches. Workup done in the past by Juan Kline (brain CT and MRI as well as EEG) was normal. He has seen her again and is scheduled to have MRI in a week. He stopped working in April (at a [employer] part time). He is again consideringapplying for disability. He denies feeling particularly depressed or suicidal. He also denies feeling particularly anxious and prefers not to change the dose of sertraline. His sleep is satisfactory and he has not needed to take trazodone in months. There are no hallucinations or delusions present.Juan Kline has a hx of alcohol use disorderbutquit on his own 5 years ago.    Visit Diagnosis:    ICD-10-CM   1. GAD (generalized anxiety disorder)  F41.1     Past Psychiatric History: Please see intake H&P.  Past Medical History:  Past Medical History:  Diagnosis Date  . Chronic foot ulcer ([place]) admitted 03/02/2014   medial border first metatarsal left foot  . Diabetes ([place])   . Diabetic foot infection ([place])   . GAD (generalized anxiety disorder)   . Hyperlipidemia   . Hypertension Dx 2009  . Mild depression ([place])   . OSA (obstructive sleep apnea)    CPAP  . Osteomyelitis left first metatarsal 03/02/2014  . Sleep apnea    . Venous stasis ulcer of left lower extremity ([place])    [name] 03/02/2014    Past Surgical History:  Procedure Laterality Date  . BIOPSY  08/18/2019   Procedure: BIOPSY;  Surgeon: Thornton Park, MD;  [facility];  Service: Gastroenterology;;  . ESOPHAGOGASTRODUODENOSCOPY (EGD) WITH PROPOFOL N/A 08/18/2019   Procedure: ESOPHAGOGASTRODUODENOSCOPY (EGD) WITH PROPOFOL;  Surgeon: Thornton Park, MD;  [facility];  Service: Gastroenterology;  Laterality: N/A;  . NO PAST SURGERIES      Family Psychiatric History: None.  Family History:  Family History  Problem Relation Age of Onset  . Hypertension Mother   . Hyperlipidemia Mother   . Diabetes Mother   . Kidney disease Mother   . Hypertension Father   . Hyperlipidemia Father   . Diabetes Brother   . Hypertension Brother   . Hyperlipidemia Brother   . Heart disease Maternal Grandmother   . COPD Maternal Grandmother   . Diabetes Maternal Grandfather   . Heart attack Paternal Grandmother   . Heart disease Paternal Grandfather     Social History:  Social History   Socioeconomic History  . Marital status: Single    Spouse name: Not on file  . Number of children: 0  . Years of education: Not on file  . Highest education level: Not on file  Occupational History  . Not on file  Tobacco Use  . Smoking status: Former Smoker    Packs/day: 1.50    Years: 26.00    Pack years: 39.00  Types: E-cigarettes, Cigarettes    Start date: 59    Quit date: 02/16/2014    Years since quitting: 5.6  . Smokeless tobacco: Never Used  Vaping Use  . Vaping Use: Former  . Start date: 04/15/2015  . Quit date: 04/11/2019  . Substances: Nicotine  Substance and Sexual Activity  . Alcohol use: Not Currently    Comment: 03/02/2014 "I rarely drink"  . Drug use: No  . Sexual activity: Not Currently  Other Topics Concern  . Not on file  Social History Narrative   Pt lives in 1 story home with his mother and step father    9th grade education   Last occupation was with [employer] in 2009      Unemployed at this time- thinking of filing for disability      Right handed      Social Determinants of Health   Financial Resource Strain:   . Difficulty of Paying Living Expenses:   Food Insecurity:   . Worried About Charity fundraiser in the Last Year:   . Arboriculturist in the Last Year:   Transportation Needs:   . Film/video editor (Medical):   Marland Kitchen Lack of Transportation (Non-Medical):   Physical Activity:   . Days of Exercise per Week:   . Minutes of Exercise per Session:   Stress:   . Feeling of Stress :   Social Connections:   . Frequency of Communication with Friends and Family:   . Frequency of Social Gatherings with Friends and Family:   . Attends Religious Services:   . Active Member of Clubs or Organizations:   . Attends Archivist Meetings:   Marland Kitchen Marital Status:     Allergies:  Allergies  Allergen Reactions  . Lyrica [Pregabalin] Anxiety    Metabolic Disorder Labs: Lab Results  Component Value Date   HGBA1C 6.8 (H) 08/17/2019   MPG 148.46 08/17/2019   MPG 134 (H) 03/03/2014   No results found for: PROLACTIN Lab Results  Component Value Date   CHOL 200 (H) 03/04/2019   TRIG 255 (H) 03/04/2019   HDL 46 03/04/2019   CHOLHDL 4.3 03/04/2019   VLDL 65 (H) 02/28/2016   LDLCALC 110 (H) 03/04/2019   LDLCALC 163 (H) 12/10/2016   Lab Results  Component Value Date   TSH 4.170 08/13/2017   TSH 2.310 09/26/2016    Therapeutic Level Labs: No results found for: LITHIUM No results found for: VALPROATE No components found for:  CBMZ  Current Medications: Current Outpatient Medications  Medication Sig Dispense Refill  . albuterol (VENTOLIN HFA) 108 (90 Base) MCG/ACT inhaler Inhale 2 puffs into the lungs every 6 (six) hours as needed for wheezing or shortness of breath. 6.7 g 11  . amLODipine (NORVASC) 10 MG tablet Take 1 tablet (10 mg total) by mouth daily. 30 tablet 6   . atorvastatin (LIPITOR) 40 MG tablet Take 1 tablet (40 mg total) by mouth daily. 90 tablet 3  . Blood Glucose Monitoring Suppl (TRUE METRIX METER) w/Device KIT Use as directed 1 kit 0  . glucose blood (TRUE METRIX BLOOD GLUCOSE TEST) test strip Use as instructed 100 each 12  . losartan-hydrochlorothiazide (HYZAAR) 100-12.5 MG tablet Take 1 tablet by mouth daily. 90 tablet 3  . omeprazole (PRILOSEC OTC) 20 MG tablet Take 2 tablets (40 mg total) by mouth 2 (two) times daily. (Patient not taking: Reported on 09/06/2019) 224 tablet 0  . sertraline (ZOLOFT) 100 MG tablet Take 1.5  tablets (150 mg total) by mouth daily. 45 tablet 5  . tiotropium (SPIRIVA) 18 MCG inhalation capsule Place 1 capsule (18 mcg total) into inhaler and inhale daily. (Patient taking differently: Place 18 mcg into inhaler and inhale daily as needed (SOB/Wheezing). ) 30 capsule 6  . TRUEPLUS LANCETS 28G MISC Use as directed 100 each 2   No current facility-administered medications for this visit.      Psychiatric Specialty Exam: Review of Systems  Neurological: Positive for dizziness, light-headedness and headaches.  All other systems reviewed and are negative.   There were no vitals taken for this visit.There is no height or weight on file to calculate BMI.  General Appearance: NA  Eye Contact:  NA  Speech:  Clear and Coherent and Normal Rate  Volume:  Normal  Mood:  Denies feeling anxious or depressed.  Affect:  NA  Thought Process:  Goal Directed and Linear  Orientation:  Full (Time, Place, and Person)  Thought Content: Logical   Suicidal Thoughts:  No  Homicidal Thoughts:  No  Memory:  Immediate;   Good Recent;   Good Remote;   Good  Judgement:  Good  Insight:  Fair  Psychomotor Activity:  NA  Concentration:  Concentration: Good  Recall:  Good  Fund of Knowledge: Good  Language: Good  Akathisia:  Negative  Handed:  Right  AIMS (if indicated): not done  Assets:  Communication Skills Desire for  Improvement Housing  ADL's:  Intact  Cognition: WNL  Sleep:  Good   Screenings: GAD-7     Office Visit from 09/26/2019 in [place] Office Visit from 08/24/2019 in [place] Office Visit from 08/17/2019 in [place] Office Visit from 08/10/2019 in [place] Office Visit from 08/04/2019 in [place]  Total GAD-7 Score 2 0 0 0 0    PHQ2-9     Office Visit from 09/26/2019 in [place] Visit from 09/06/2019 in [facility] [place] Office Visit from 08/24/2019 in [place] Office Visit from 08/17/2019 in [place] Office Visit from 08/10/2019 in [place]  PHQ-2 Total Score 0 0 0 4 4  PHQ-9 Total Score 4 -- 1 8 9        Assessment and Plan: 49 yo single male with hx of anxiety, some situational depression and occasional spells/episodes of shaking, tunnel vision, SOB, dizziness, lightheadedness. Patient has been doing fairly well on sertraline but lately he again started to complain of dizziness, headaches. Workup done in the past by Juan Kline (brain CT and MRI as well as EEG) was normal. He has seen her again and is scheduled to have MRI in a week. He stopped working in April (at a [employer] part time). He is again consideringapplying for disability. He denies feeling particularly depressed or suicidal. He also denies feeling particularly anxious and prefers not to change the dose of sertraline. His sleep is satisfactory and he has not needed to take trazodone in months. There are no hallucinations or delusions present.Juan Kline has a hx of alcohol use disorderbutquit on his own 5 years ago.   Dx: Generalized anxiety disorder;Depressive disorder unspecified, in remission; Alcohol use disorder in sustained  remission; r/o somatoform disorder  Plan:Continuesertraline 150 mg daily. Next appointment in 3 months.The plan was discussed with patient who had  an opportunity to ask questions and these were all answered. I spend16mnutes inphone consultationwiththe patient.    OStephanie Acre MD 09/30/2019, 10:40 AM

## 2019-10-07 ENCOUNTER — Other Ambulatory Visit (INDEPENDENT_AMBULATORY_CARE_PROVIDER_SITE_OTHER): Payer: Self-pay

## 2019-10-07 ENCOUNTER — Ambulatory Visit (INDEPENDENT_AMBULATORY_CARE_PROVIDER_SITE_OTHER): Payer: Self-pay | Admitting: Nurse Practitioner

## 2019-10-07 ENCOUNTER — Encounter: Payer: Self-pay | Admitting: Nurse Practitioner

## 2019-10-07 VITALS — BP 110/78 | HR 91 | Ht 70.0 in | Wt 309.0 lb

## 2019-10-07 DIAGNOSIS — Z1211 Encounter for screening for malignant neoplasm of colon: Secondary | ICD-10-CM

## 2019-10-07 DIAGNOSIS — D649 Anemia, unspecified: Secondary | ICD-10-CM

## 2019-10-07 LAB — CBC
HCT: 40.5 % (ref 39.0–52.0)
Hemoglobin: 13.6 g/dL (ref 13.0–17.0)
MCHC: 33.5 g/dL (ref 30.0–36.0)
MCV: 83 fl (ref 78.0–100.0)
Platelets: 285 10*3/uL (ref 150.0–400.0)
RBC: 4.88 Mil/uL (ref 4.22–5.81)
RDW: 14.3 % (ref 11.5–15.5)
WBC: 10.6 10*3/uL — ABNORMAL HIGH (ref 4.0–10.5)

## 2019-10-07 NOTE — Patient Instructions (Signed)
If you are age 48 or older, your body mass index should be between 23-30. Your Body mass index is 44.34 kg/m. If this is out of the aforementioned range listed, please consider follow up with your Primary Care Provider.  If you are age 82 or younger, your body mass index should be between 19-25. Your Body mass index is 44.34 kg/m. If this is out of the aformentioned range listed, please consider follow up with your Primary Care Provider.   Your provider has requested that you go to the basement level for lab work before leaving today. Press "B" on the elevator. The lab is located at the first door on the left as you exit the elevator.  Please contact our office when you are ready to schedule your colonoscopy  Due to recent changes in healthcare laws, you may see the results of your imaging and laboratory studies on MyChart before your provider has had a chance to review them.  We understand that in some cases there may be results that are confusing or concerning to you. Not all laboratory results come back in the same time frame and the provider may be waiting for multiple results in order to interpret others.  Please give [name] 48 hours in order for your provider to thoroughly review all the results before contacting the office for clarification of your results.   Thank you for choosing [facility] Noralyn Pick, CRNP

## 2019-10-07 NOTE — Progress Notes (Signed)
10/07/2019 Juan Kline [id number] 10-Dec-1970   Chief Complaint: Schedule a colonoscopy, hospital follow up  History of Present Illness: Juan Kline is a 49 y.o. male with a past medical history of hypertension, hyperlipidemia, DM II, neuropathy and sleep apnea on Cpap. I initially saw the patient during his hospital admission for epigastric pain and melena 08/17/2019. His admission Hg level was 14.4. An EGD was done by Dr. Tarri Glenn on 08/18/2019 which showed esophagitis and diffuse gastritis. No evidence of H. Pylori. He was prescribed Omeprazole 40mg  po bid x 8 weeks and Caragate 1gm po qid x 2 weeks. He was advised to follow up in our office to schedule a colonoscopy as an outpatient.   Currently, he denies having any heartburn, dysphagia or stomach pain. He is passing a normal brown formed BM daily, sometimes his stool is greasy, difficulty to wipe. No black stool. No rectal bleeding.   He is currently undergoing a neurological evaluation for dizziness, balance issues and headaches.He is scheduled to have a brain MRI within the next few weeks. He wishes to proceed with his neurological evaluation prior to pursuing a colonoscopy which is appropriate.     EGD 08/18/2019 by Dr. Tarri Glenn: - LA Grade A reflux esophagitis with no bleeding. Biopsied. - Mild gastritis. Biopsied. - Erythematous duodenopathy. Biopsied. - The examination was otherwise normal.  A. DUODENUM, BIOPSY:  - Benign small bowel type mucosa.  - There is no evidence of significant villous atrophy, dysplasia, or  malignancy.   B. STOMACH, ANTRUM, BIOPSY:  - Reactive type gastropathy.  - There is no evidence of Helicobacter pylori, dysplasia, or malignancy.  - See comment.   C. STOMACH, BODY, BIOPSY:  - Essentially unremarkable gastric type mucosa.  - There is no evidence of Helicobacter pylori, dysplasia, or malignancy.   D. STOMACH, FUNDUS, BIOPSY:  - Essentially unremarkable gastric type mucosa.  - There is no evidence  of Helicobacter pylori, dysplasia, or malignancy.   E. ESOPHAGUS, DISTAL, BIOPSY:  - Benign squamous mucosa.  - There is no evidence of significant increase in eosinophils,  dysplasia, or malignancy.   F. ESOPHAGUS, PROXIMAL MID, BIOPSY:  - Benign squamous mucosa.  - There is no evidence of significant increase in eosinophils,  dysplasia, or malignancy.    Current Medications, Allergies, Past Medical History, Past Surgical History, Family History and Social History were reviewed in [employer] record.   Physical Exam: BP 110/78   Pulse 91   Ht 5\' 10"  (1.778 m)   Wt (!) 309 lb (140.2 kg)   SpO2 96%   BMI 44.34 kg/m  General: Well developed 49 year old male in no acute distress. Head: Normocephalic and atraumatic. Eyes: No scleral icterus. Conjunctiva pink . Ears: Normal auditory acuity. Lungs: Clear throughout to auscultation. Heart: Regular rate and rhythm, no murmur. Abdomen: Soft, nontender and nondistended. Abdominal hernia right of umbilicus. No hepatomegaly. Normal bowel sounds x 4 quadrants.  Rectal: Deferred.  Musculoskeletal: Symmetrical with no gross deformities. Extremities: No edema. Neurological: Alert oriented x 4. No focal deficits.  Psychological: Alert and cooperative. Normal mood and affect  Assessment and Recommendations:  20. 49 year old male admitted to the hospital 08/17/2019 with epigastric pain and melena. S/P EGD 5/6 showed esophagitis and gastritis, no source of GI bleeding was identified. No evidence of H. Pylori. No further black stools. Epigastric pain has resolved.  -Continue Omeprazole 40mg  po bid to complete 8 week course -Patient to call our office if he develops  epigastric pain or melena -Avoid  NSAIDS -CBC  2. + FOBT.  -Patient will call our office to schedule a colonoscopy after he completes his neurological evaluation.   3. Dizziness, balance issues, tinnitus and headaches -Proceed neuro evaluation as planned    4. Ventral abdominal wall hernia right of and around the umbilicus -May require future surgical intervention, defer follow up to PCP

## 2019-10-11 ENCOUNTER — Other Ambulatory Visit: Payer: Self-pay

## 2019-10-11 ENCOUNTER — Ambulatory Visit
Admission: RE | Admit: 2019-10-11 | Discharge: 2019-10-11 | Disposition: A | Discharge status: Home / Self Care | Payer: No Typology Code available for payment source | Source: Ambulatory Visit | Attending: Neurology | Admitting: Neurology

## 2019-10-11 DIAGNOSIS — H9313 Tinnitus, bilateral: Secondary | ICD-10-CM

## 2019-10-11 DIAGNOSIS — R42 Dizziness and giddiness: Secondary | ICD-10-CM

## 2019-10-11 MED ORDER — GADOBENATE DIMEGLUMINE 529 MG/ML IV SOLN
20.0000 mL | Freq: Once | INTRAVENOUS | Status: AC | PRN
  Administered 2019-10-11: 20 mL via INTRAVENOUS

## 2019-10-12 ENCOUNTER — Other Ambulatory Visit: Payer: Self-pay

## 2019-10-12 ENCOUNTER — Telehealth: Payer: Self-pay

## 2019-10-12 NOTE — Telephone Encounter (Signed)
Patient received a missed call and returning our call

## 2019-10-12 NOTE — Telephone Encounter (Signed)
Called pt and informed him that MRI showed no evidence of tumor, stroke, bleed or any inflammation that would cause his symptoms per Dr Delice Lesch. He verbalized understanding and states his symptoms are unchanged. He would like to move forward with a spinal tap.

## 2019-10-13 ENCOUNTER — Other Ambulatory Visit: Payer: Self-pay

## 2019-10-13 DIAGNOSIS — R42 Dizziness and giddiness: Secondary | ICD-10-CM

## 2019-10-13 NOTE — Telephone Encounter (Signed)
On his LP order, pls write Pls measure opening pressure to assess for intracranial HYPOtension. Pls send CSF for cell count, glucose, protein, ACE, gram stain and culture, IgG index, oligoclonal bands. Thanks

## 2019-10-18 ENCOUNTER — Other Ambulatory Visit: Payer: Self-pay | Admitting: Internal Medicine

## 2019-10-18 DIAGNOSIS — I10 Essential (primary) hypertension: Secondary | ICD-10-CM

## 2019-10-18 MED FILL — ?ATORVASTATIN 40MG TABLET: 40 | 30 days supply | Qty: 30 | Fill #7

## 2019-10-18 MED FILL — ?AMLODIPINE BESYL 10MG TABL: 10 | 30 days supply | Qty: 30 | Fill #0

## 2019-10-18 MED FILL — LOSARTAN-HCTZ 100-12.5 MG T: 100-12.5 | 30 days supply | Qty: 30 | Fill #7

## 2019-10-18 MED FILL — SERTRALINE HCL 100 MG TAB: 100 | 30 days supply | Qty: 45 | Fill #0

## 2019-10-18 NOTE — Progress Notes (Signed)
Please follow-up with the patient in one month to schedule colonoscopy. Thank you.

## 2019-10-19 ENCOUNTER — Ambulatory Visit
Admission: RE | Admit: 2019-10-19 | Discharge: 2019-10-19 | Disposition: A | Discharge status: Home / Self Care | Payer: No Typology Code available for payment source | Source: Ambulatory Visit | Attending: Neurology | Admitting: Neurology

## 2019-10-19 ENCOUNTER — Other Ambulatory Visit: Payer: Self-pay

## 2019-10-19 DIAGNOSIS — R42 Dizziness and giddiness: Secondary | ICD-10-CM

## 2019-10-19 DIAGNOSIS — R200 Anesthesia of skin: Secondary | ICD-10-CM

## 2019-10-19 NOTE — Progress Notes (Signed)
One SST tube of blood drawn from right AC space for LP labs; site unremarkable. 

## 2019-10-19 NOTE — Discharge Instructions (Signed)

## 2019-10-26 ENCOUNTER — Telehealth: Payer: Self-pay

## 2019-10-26 ENCOUNTER — Telehealth: Payer: Self-pay | Admitting: Neurology

## 2019-10-26 DIAGNOSIS — R2689 Other abnormalities of gait and mobility: Secondary | ICD-10-CM

## 2019-10-26 DIAGNOSIS — R42 Dizziness and giddiness: Secondary | ICD-10-CM

## 2019-10-26 DIAGNOSIS — R519 Headache, unspecified: Secondary | ICD-10-CM

## 2019-10-26 LAB — CSF CELL COUNT WITH DIFFERENTIAL
RBC Count, CSF: 1 cells/uL — ABNORMAL HIGH
WBC, CSF: 1 cells/uL (ref 0–5)

## 2019-10-26 LAB — GLUCOSE, CSF: Glucose, CSF: 89 mg/dL — ABNORMAL HIGH (ref 40–80)

## 2019-10-26 LAB — CNS IGG SYNTHESIS RATE, CSF+BLOOD
Albumin Serum: 4 g/dL (ref 3.5–5.2)
Albumin, CSF: 14.6 mg/dL (ref 8.0–42.0)
CNS-IgG Synthesis Rate: -2.6 mg/24 h (ref <=3.3)
IgG (Immunoglobin G), Serum: 881 mg/dL (ref 600–1640)
IgG Total CSF: 1.6 mg/dL (ref 0.8–7.7)
IgG-Index: 0.5 (ref <0.66)

## 2019-10-26 LAB — CSF CULTURE W GRAM STAIN
GRAM STAIN:: NONE SEEN
MICRO NUMBER:: 10675237
Result:: NO GROWTH
SPECIMEN QUALITY:: ADEQUATE

## 2019-10-26 LAB — ANGIOTENSIN CONVERTING ENZYME, CSF: ACE, CSF: 6 U/L (ref <=15)

## 2019-10-26 LAB — PROTEIN, CSF: Total Protein, CSF: 32 mg/dL (ref 15–45)

## 2019-10-26 LAB — OLIGOCLONAL BANDS, CSF + SERM

## 2019-10-26 NOTE — Telephone Encounter (Signed)
-----   Message from Cameron Sprang, MD sent at 10/26/2019  9:43 AM EDT ----- Pls let him know that the results of the spinal tap finally all have come back. His pressure and spinal fluid is normal. I am not finding a neurological cause for his symptoms. At this point, would recommend second opinion from an academic center such as [facility]. If he is agreeable, pls send referral to [facility], dx: dizziness, headaches, balance problems. Thanks

## 2019-10-26 NOTE — Telephone Encounter (Signed)
Spoke to pt and informed him the results of the spinal tap finally all have come back. His pressure and spinal fluid is normal. I am not finding a neurological cause for his symptoms. At this point, would recommend second opinion from an academic center such as [facility] Referral placed to [facility] [place]

## 2019-10-26 NOTE — Telephone Encounter (Signed)
Spoke with pt informed him that results of the spinal tap finally all have come back. His pressure and spinal fluid is normal. I am not finding a neurological cause for his symptoms. At this point, would recommend second opinion from an academic center such as [facility]. Pt is ok with [name] sending in referral to [facility],

## 2019-10-26 NOTE — Telephone Encounter (Signed)
Pt called back see result notes

## 2019-10-26 NOTE — Telephone Encounter (Signed)
-----   Message from Cameron Sprang, MD sent at 10/26/2019  9:43 AM EDT ----- Pls let him know that the results of the spinal tap finally all have come back. His pressure and spinal fluid is normal. I am not finding a neurological cause for his symptoms. At this point, would recommend second opinion from an academic center such as [facility]. If he is agreeable, pls send referral to [facility] Neurology, dx: dizziness, headaches, balance problems. Thanks

## 2019-10-26 NOTE — Telephone Encounter (Signed)
Patient called in wanting to find out the results of his testing he did and where to go from there.

## 2019-10-26 NOTE — Telephone Encounter (Signed)
Denyse Amass from [employer] called in and left a message there were some test results in with reference number [id number] P

## 2019-11-08 ENCOUNTER — Other Ambulatory Visit: Payer: Self-pay

## 2019-11-08 ENCOUNTER — Ambulatory Visit: Payer: Self-pay | Attending: Internal Medicine | Admitting: Internal Medicine

## 2019-11-08 DIAGNOSIS — G8929 Other chronic pain: Secondary | ICD-10-CM

## 2019-11-08 DIAGNOSIS — R519 Headache, unspecified: Secondary | ICD-10-CM

## 2019-11-08 DIAGNOSIS — H9313 Tinnitus, bilateral: Secondary | ICD-10-CM

## 2019-11-08 DIAGNOSIS — R42 Dizziness and giddiness: Secondary | ICD-10-CM

## 2019-11-08 NOTE — Progress Notes (Signed)
Virtual Visit via Telephone Note Due to current restrictions/limitations of in-office visits due to the COVID-19 pandemic, this scheduled clinical appointment was converted to a telehealth visit  I connected with Juan Kline on 11/08/19 at 9:01 a.m by telephone and verified that I am speaking with the correct person using two identifiers. I am in my office.  The patient is at home.  Only the patient and myself participated in this encounter.  I discussed the limitations, risks, security and privacy concerns of performing an evaluation and management service by telephone and the availability of in person appointments. I also discussed with the patient that there may be a patient responsible charge related to this service. The patient expressed understanding and agreed to proceed.   History of Present Illness: HTN, HL,DM type 2with neuropathy,anxiety, chronic fatigue, severe OSAon BiPAP, vapes, mix COPD, obesity, gastritis (EGD 08/2019).  Last visit with me 09/28/2019.  This is a 6 weeks follow-up.  Since last visit, pt has completed his work-up with the neurologist Dr. Delice Lesch.  He had lumbar puncture done with pressures that were normal.  Spinal fluid was sent for various studies that came back normal.  MRI of the head revealed no acute findings.  There was some chronic small vessel ischemic changes of the pons that had slightly progressed since 2019.  She referred him to an academic center being [facility].  He states he has not received a call on appointment as yet.  He is uninsured Today he reports that he is still having the symptoms which he states include headaches, ringing in ears, feeling terrible when he is up.  "I have been eating Tylenol."  Taking Tylenol 1000 mg TID.  Reports that symptoms started after his first shot of the [employer] vaccine I had referred him to ENT on last visit with me.  Did not go to ENT because they required $100 which he does not have.  He is not sure how  he would pay for a visit to [facility] either if they require a co-pay. He is wanting an extension to be off of work further.  He has been out of work since April.  States that he spoke with his supervisor and was told that the company will be using a different company for [employer].  Was told that they would get back to him but has not heard anything as yet.  Outpatient Encounter Medications as of 11/08/2019  Medication Sig Note  . albuterol (VENTOLIN HFA) 108 (90 Base) MCG/ACT inhaler Inhale 2 puffs into the lungs every 6 (six) hours as needed for wheezing or shortness of breath. (Patient not taking: Reported on 10/07/2019) 08/17/2019: Has on hand  . amLODipine (NORVASC) 10 MG tablet TAKE 1 TABLET (10 MG TOTAL) BY MOUTH DAILY.   Marland Kitchen atorvastatin (LIPITOR) 40 MG tablet Take 1 tablet (40 mg total) by mouth daily.   . Blood Glucose Monitoring Suppl (TRUE METRIX METER) w/Device KIT Use as directed   . glucose blood (TRUE METRIX BLOOD GLUCOSE TEST) test strip Use as instructed   . losartan-hydrochlorothiazide (HYZAAR) 100-12.5 MG tablet Take 1 tablet by mouth daily.   Marland Kitchen omeprazole (PRILOSEC OTC) 20 MG tablet Take 2 tablets (40 mg total) by mouth 2 (two) times daily. (Patient not taking: Reported on 09/06/2019)   . sertraline (ZOLOFT) 100 MG tablet Take 1.5 tablets (150 mg total) by mouth daily.   Marland Kitchen tiotropium (SPIRIVA) 18 MCG inhalation capsule Place 1 capsule (18 mcg total) into  inhaler and inhale daily. (Patient taking differently: Place 18 mcg into inhaler and inhale daily as needed (SOB/Wheezing). )   . TRUEPLUS LANCETS 28G MISC Use as directed    No facility-administered encounter medications on file as of 11/08/2019.      Observations/Objective: Review MRI and LP studies  Assessment and Plan: 1. Tinnitus of both ears 2. Dizziness 3. Chronic intractable headache, unspecified headache type -Patient seeking extension to be out of work for a longer period of time.  However he reports  that he is unable to afford the $100 co-pay to be able to see ENT and is not sure what he will do if [facility] neurology requires a co-pay to.  Advised patient to call and speak with our referral coordinator so that she can give instructions on how to apply for financial assistance through [facility].  In the meantime however given that his baseline studies through Dr. Delice Lesch are okay, I recommend trying to go back to work August 1 until he sees the neurologist at [facility].  He is not sure how much FMLA he has a left as he was out of work since sometime in April until now.  I told him to check with his company and find out and then he can call us back and let us know how long he wants the extension to go.  I will also get Dr. Amparo Bristol opinion on this. -I recommend cutting back on daily use of Tylenol as it can contribute to rebound headaches.    Follow Up Instructions: 2 mths   I discussed the assessment and treatment plan with the patient. The patient was provided an opportunity to ask questions and all were answered. The patient agreed with the plan and demonstrated an understanding of the instructions.   The patient was advised to call back or seek an in-person evaluation if the symptoms worsen or if the condition fails to improve as anticipated.  I provided 19 minutes of non-face-to-face time during this encounter.   Karle Plumber, MD

## 2019-11-16 MED FILL — ?ATORVASTATIN 40MG TABLET: 40 | 30 days supply | Qty: 30 | Fill #8

## 2019-11-16 MED FILL — LOSARTAN-HCTZ 100-12.5 MG T: 100-12.5 | 30 days supply | Qty: 30 | Fill #8

## 2019-11-16 MED FILL — AMLODIPINE BESYLATE 10 MG T: 10 | 30 days supply | Qty: 30 | Fill #1

## 2019-11-23 ENCOUNTER — Encounter: Payer: Self-pay | Admitting: Gastroenterology

## 2019-12-01 ENCOUNTER — Other Ambulatory Visit: Payer: Self-pay

## 2019-12-01 ENCOUNTER — Encounter: Payer: Self-pay | Admitting: Physician Assistant

## 2019-12-01 ENCOUNTER — Ambulatory Visit: Payer: No Typology Code available for payment source | Attending: Physician Assistant | Admitting: Physician Assistant

## 2019-12-01 VITALS — BP 103/72 | HR 81 | Temp 97.7°F | Wt 325.0 lb

## 2019-12-01 DIAGNOSIS — E1142 Type 2 diabetes mellitus with diabetic polyneuropathy: Secondary | ICD-10-CM

## 2019-12-01 DIAGNOSIS — D1722 Benign lipomatous neoplasm of skin and subcutaneous tissue of left arm: Secondary | ICD-10-CM

## 2019-12-01 LAB — GLUCOSE, POCT (MANUAL RESULT ENTRY): POC Glucose: 170 mg/dl — AB (ref 70–99)

## 2019-12-01 NOTE — Patient Instructions (Signed)
Lipoma  A lipoma is a noncancerous (benign) tumor that is made up of fat cells. This is a very common type of soft-tissue growth. Lipomas are usually found under the skin (subcutaneous). They may occur in any tissue of the body that contains fat. Common areas for lipomas to appear include the back, arms, shoulders, buttocks, and thighs. Lipomas grow slowly, and they are usually painless. Most lipomas do not cause problems and do not require treatment. What are the causes? The cause of this condition is not known. What increases the risk? You are more likely to develop this condition if:  You are 40-60 years old.  You have a family history of lipomas. What are the signs or symptoms? A lipoma usually appears as a small, round bump under the skin. In most cases, the lump will:  Feel soft or rubbery.  Not cause pain or other symptoms. However, if a lipoma is located in an area where it pushes on nerves, it can become painful or cause other symptoms. How is this diagnosed? A lipoma can usually be diagnosed with a physical exam. You may also have tests to confirm the diagnosis and to rule out other conditions. Tests may include:  Imaging tests, such as a CT scan or an MRI.  Removal of a tissue sample to be looked at under a microscope (biopsy). How is this treated? Treatment for this condition depends on the size of the lipoma and whether it is causing any symptoms.  For small lipomas that are not causing problems, no treatment is needed.  If a lipoma is bigger or it causes problems, surgery may be done to remove the lipoma. Lipomas can also be removed to improve appearance. Most often, the procedure is done after applying a medicine that numbs the area (local anesthetic).  Liposuction may be done to reduce the size of the lipoma before it is removed through surgery, or it may be done to remove the lipoma. Lipomas are removed with this method in order to limit incision size and scarring. A  liposuction tube is inserted through a small incision into the lipoma, and the contents of the lipoma are removed through the tube with suction. Follow these instructions at home:  Watch your lipoma for any changes.  Keep all follow-up visits as told by your health care provider. This is important. Contact a health care provider if:  Your lipoma becomes larger or hard.  Your lipoma becomes painful, red, or increasingly swollen. These could be signs of infection or a more serious condition. Get help right away if:  You develop tingling or numbness in an area near the lipoma. This could indicate that the lipoma is causing nerve damage. Summary  A lipoma is a noncancerous tumor that is made up of fat cells.  Most lipomas do not cause problems and do not require treatment.  If a lipoma is bigger or it causes problems, surgery may be done to remove the lipoma.  Contact a health care provider if your lipoma becomes larger or hard, or if it becomes painful, red, or increasingly swollen. Pain, redness, and swelling could be signs of infection or a more serious condition. This information is not intended to replace advice given to you by your health care provider. Make sure you discuss any questions you have with your health care provider. Document Revised: 11/15/2018 Document Reviewed: 11/15/2018 Elsevier Patient Education  2020 Elsevier Inc.  

## 2019-12-01 NOTE — Progress Notes (Signed)
Juan Kline, is a 49 y.o. male  XTK:[id number]  DJM:[id number]  DOB - 1970/06/26  Subjective:  Chief Complaint and HPI: Juan Kline is a 49 y.o. male here today for a lump that seems to be increasing in size.  It has been present for several years but he has noticed it has increased in size over the last few months.  Also of note though, he has lost about 30 pounds which may be making it more prominent.    Blood sugars at home running 105-140.  He is working on diet bc does not want to have to restart meds  ROS:   Constitutional:  No f/c, No night sweats, No unexplained weight loss. EENT:  No vision changes, No blurry vision, No hearing changes. No mouth, throat, or ear problems.  Respiratory: No cough, No SOB Cardiac: No CP, no palpitations GI:  No abd pain, No N/V/D. GU: No Urinary s/sx Musculoskeletal: No joint pain Neuro: No headache, no dizziness, no motor weakness.  Skin: No rash Endocrine:  No polydipsia. No polyuria.  Psych: Denies SI/HI  No problems updated.  ALLERGIES: Allergies  Allergen Reactions  . Lyrica [Pregabalin] Anxiety    PAST MEDICAL HISTORY: Past Medical History:  Diagnosis Date  . Chronic foot ulcer ([place]) admitted 03/02/2014   medial border first metatarsal left foot  . Diabetes ([place])   . Diabetic foot infection ([place])   . GAD (generalized anxiety disorder)   . Hyperlipidemia   . Hypertension Dx 2009  . Mild depression ([place])   . OSA (obstructive sleep apnea)    CPAP  . Osteomyelitis left first metatarsal 03/02/2014  . Sleep apnea   . Venous stasis ulcer of left lower extremity ([place])    [name] 03/02/2014    MEDICATIONS AT HOME: Prior to Admission medications   Medication Sig Start Date End Date Taking? Authorizing Provider  amLODipine (NORVASC) 10 MG tablet TAKE 1 TABLET (10 MG TOTAL) BY MOUTH DAILY. 10/18/19  Yes Ladell Pier, MD  atorvastatin (LIPITOR) 40 MG tablet Take 1 tablet (40 mg total) by mouth daily. 03/06/19  Yes Ladell Pier, MD  Blood Glucose Monitoring Suppl (TRUE METRIX METER) w/Device KIT Use as directed 04/13/18  Yes Ladell Pier, MD  glucose blood (TRUE METRIX BLOOD GLUCOSE TEST) test strip Use as instructed 09/26/19  Yes Ladell Pier, MD  losartan-hydrochlorothiazide (HYZAAR) 100-12.5 MG tablet Take 1 tablet by mouth daily. 08/24/19  Yes Elsie Stain, MD  sertraline (ZOLOFT) 100 MG tablet Take 1.5 tablets (150 mg total) by mouth daily. 06/30/19 12/27/19 Yes Pucilowski, Olgierd A, MD  tiotropium (SPIRIVA) 18 MCG inhalation capsule Place 1 capsule (18 mcg total) into inhaler and inhale daily. Patient taking differently: Place 18 mcg into inhaler and inhale daily as needed (SOB/Wheezing).  02/14/19  Yes Julian Hy, DO  TRUEPLUS LANCETS 28G MISC Use as directed 04/13/18  Yes Ladell Pier, MD  albuterol (VENTOLIN HFA) 108 (90 Base) MCG/ACT inhaler Inhale 2 puffs into the lungs every 6 (six) hours as needed for wheezing or shortness of breath. Patient not taking: Reported on 10/07/2019 02/14/19   Noemi Chapel P, DO  omeprazole (PRILOSEC OTC) 20 MG tablet Take 2 tablets (40 mg total) by mouth 2 (two) times daily. Patient not taking: Reported on 09/06/2019 08/18/19 10/13/19  Flora Lipps, MD     Objective:  EXAM:   Vitals:   12/01/19 1603  BP: 103/72  Pulse: 81  Temp: 97.7 F (36.5 C)  TempSrc: Temporal  SpO2: 93%  Weight: (!) 325 lb (147.4 kg)    General appearance : A&OX3. NAD. Non-toxic-appearing HEENT: Atraumatic and Normocephalic.  PERRLA. EOM intact.   Chest/Lungs:  Breathing-non-labored, Good air entry bilaterally, breath sounds normal without rales, rhonchi, or wheezing  CVS: S1 S2 regular, no murmurs, gallops, rubs  L arm just distal to [facility] on volar surface of forearm, freely mobile 4x5cm soft subcutaneous lump.  No skin changes Extremities: Bilateral Lower Ext shows no edema, both legs are warm to touch with = pulse throughout Neurology:  CN II-XII grossly intact,  Non focal.   Psych:  TP linear. J/I WNL. Normal speech. Appropriate eye contact and affect.  Skin:  No Rash  Data Review Lab Results  Component Value Date   HGBA1C 6.8 (H) 08/17/2019   HGBA1C 6.7 07/07/2019   HGBA1C 6.6 (H) 03/04/2019     Assessment & Plan   1. Type 2 diabetes mellitus with diabetic polyneuropathy, without long-term current use of insulin ([place]) I have had a lengthy discussion and provided education about insulin resistance and the intake of too much sugar/refined carbohydrates.  I have advised the patient to work at a goal of eliminating sugary drinks, candy, desserts, sweets, refined sugars, processed foods, and white carbohydrates.  The patient expresses understanding.  - Glucose (CBG)  2. 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  The patient was given clear instructions to go to ER or return to medical center if symptoms don't improve, worsen or new problems develop. The patient verbalized understanding. The patient was told to call to get lab results if they haven't heard anything in the next week.     Freeman Caldron, PA-C [facility] and [place], [place]   12/01/2019, 4:17 PMPatient ID: Juan Kline, male   DOB: Apr 24, 1970, 49 y.o.   MRN: [id number]

## 2019-12-07 MED FILL — SERTRALINE HCL 100 MG TAB: 100 | 30 days supply | Qty: 45 | Fill #1

## 2019-12-08 ENCOUNTER — Encounter: Payer: Self-pay | Admitting: Surgery

## 2019-12-08 ENCOUNTER — Other Ambulatory Visit: Payer: Self-pay

## 2019-12-08 ENCOUNTER — Ambulatory Visit (INDEPENDENT_AMBULATORY_CARE_PROVIDER_SITE_OTHER): Payer: Self-pay | Admitting: Surgery

## 2019-12-08 VITALS — BP 118/80 | HR 84 | Temp 97.9°F | Resp 12 | Ht 70.0 in | Wt 327.0 lb

## 2019-12-08 DIAGNOSIS — Z6841 Body Mass Index (BMI) 40.0 and over, adult: Secondary | ICD-10-CM

## 2019-12-08 DIAGNOSIS — E882 Lipomatosis, not elsewhere classified: Secondary | ICD-10-CM

## 2019-12-08 NOTE — Patient Instructions (Signed)
Follow up as needed, call the office if you have any questions or concerns.    Lipoma  A lipoma is a noncancerous (benign) tumor that is made up of fat cells. This is a very common type of soft-tissue growth. Lipomas are usually found under the skin (subcutaneous). They may occur in any tissue of the body that contains fat. Common areas for lipomas to appear include the back, arms, shoulders, buttocks, and thighs. Lipomas grow slowly, and they are usually painless. Most lipomas do not cause problems and do not require treatment. What are the causes? The cause of this condition is not known. What increases the risk? You are more likely to develop this condition if:  You are 77-38 years old.  You have a family history of lipomas. What are the signs or symptoms? A lipoma usually appears as a small, round bump under the skin. In most cases, the lump will:  Feel soft or rubbery.  Not cause pain or other symptoms. However, if a lipoma is located in an area where it pushes on nerves, it can become painful or cause other symptoms. How is this diagnosed? A lipoma can usually be diagnosed with a physical exam. You may also have tests to confirm the diagnosis and to rule out other conditions. Tests may include:  Imaging tests, such as a CT scan or an MRI.  Removal of a tissue sample to be looked at under a microscope (biopsy). How is this treated? Treatment for this condition depends on the size of the lipoma and whether it is causing any symptoms.  For small lipomas that are not causing problems, no treatment is needed.  If a lipoma is bigger or it causes problems, surgery may be done to remove the lipoma. Lipomas can also be removed to improve appearance. Most often, the procedure is done after applying a medicine that numbs the area (local anesthetic).  Liposuction may be done to reduce the size of the lipoma before it is removed through surgery, or it may be done to remove the lipoma.  Lipomas are removed with this method in order to limit incision size and scarring. A liposuction tube is inserted through a small incision into the lipoma, and the contents of the lipoma are removed through the tube with suction. Follow these instructions at home:  Watch your lipoma for any changes.  Keep all follow-up visits as told by your health care provider. This is important. Contact a health care provider if:  Your lipoma becomes larger or hard.  Your lipoma becomes painful, red, or increasingly swollen. These could be signs of infection or a more serious condition. Get help right away if:  You develop tingling or numbness in an area near the lipoma. This could indicate that the lipoma is causing nerve damage. Summary  A lipoma is a noncancerous tumor that is made up of fat cells.  Most lipomas do not cause problems and do not require treatment.  If a lipoma is bigger or it causes problems, surgery may be done to remove the lipoma.  Contact a health care provider if your lipoma becomes larger or hard, or if it becomes painful, red, or increasingly swollen. Pain, redness, and swelling could be signs of infection or a more serious condition. This information is not intended to replace advice given to you by your health care provider. Make sure you discuss any questions you have with your health care provider. Document Revised: 11/15/2018 Document Reviewed: 11/15/2018 Elsevier Patient Education  2020 Elsevier Inc.  

## 2019-12-08 NOTE — Progress Notes (Signed)
Patient ID: Juan Kline, male   DOB: 07-May-1970, 49 y.o.   MRN: [id number]  Chief Complaint: Left forearm lipoma  History of Present Illness Juan Kline is a 49 y.o. male with a left antecubital soft tissue mass noted several months ago.  Has noted a slight increase in size over the time.  Denies drainage, pain or tenderness.  He does not bump it against anything during his work, but does not limit his range of motion or affect any activities of daily living.  Has noted multiple similar lumps on his lower extremities over the years since being a teenager.  None of them are bothersome. Has a history of some venous stasis changes in his lower extremities and has worn compression hose in the past.  He is not wearing them today.  Past Medical History Past Medical History:  Diagnosis Date  . Chronic foot ulcer ([place]) admitted 03/02/2014   medial border first metatarsal left foot  . Diabetes ([place])   . Diabetic foot infection ([place])   . GAD (generalized anxiety disorder)   . Hyperlipidemia   . Hypertension Dx 2009  . Mild depression ([place])   . OSA (obstructive sleep apnea)    CPAP  . Osteomyelitis left first metatarsal 03/02/2014  . Sleep apnea   . Venous stasis ulcer of left lower extremity ([place])    [name] 03/02/2014      Past Surgical History:  Procedure Laterality Date  . BIOPSY  08/18/2019   Procedure: BIOPSY;  Surgeon: Thornton Park, MD;  Location: [facility];  Service: Gastroenterology;;  . ESOPHAGOGASTRODUODENOSCOPY (EGD) WITH PROPOFOL N/A 08/18/2019   Procedure: ESOPHAGOGASTRODUODENOSCOPY (EGD) WITH PROPOFOL;  Surgeon: Thornton Park, MD;  [facility];  Service: Gastroenterology;  Laterality: N/A;  . NO PAST SURGERIES      Allergies  Allergen Reactions  . Lyrica [Pregabalin] Anxiety    Current Outpatient Medications  Medication Sig Dispense Refill  . albuterol (VENTOLIN HFA) 108 (90 Base) MCG/ACT inhaler Inhale 2 puffs into the lungs every 6 (six) hours as needed  for wheezing or shortness of breath. 6.7 g 11  . amLODipine (NORVASC) 10 MG tablet TAKE 1 TABLET (10 MG TOTAL) BY MOUTH DAILY. 30 tablet 6  . atorvastatin (LIPITOR) 40 MG tablet Take 1 tablet (40 mg total) by mouth daily. 90 tablet 3  . Blood Glucose Monitoring Suppl (TRUE METRIX METER) w/Device KIT Use as directed 1 kit 0  . glucose blood (TRUE METRIX BLOOD GLUCOSE TEST) test strip Use as instructed 100 each 12  . losartan-hydrochlorothiazide (HYZAAR) 100-12.5 MG tablet Take 1 tablet by mouth daily. 90 tablet 3  . sertraline (ZOLOFT) 100 MG tablet Take 1.5 tablets (150 mg total) by mouth daily. 45 tablet 5  . tiotropium (SPIRIVA) 18 MCG inhalation capsule Place 1 capsule (18 mcg total) into inhaler and inhale daily. (Patient taking differently: Place 18 mcg into inhaler and inhale daily as needed (SOB/Wheezing). ) 30 capsule 6  . TRUEPLUS LANCETS 28G MISC Use as directed 100 each 2  . omeprazole (PRILOSEC OTC) 20 MG tablet Take 2 tablets (40 mg total) by mouth 2 (two) times daily. (Patient not taking: Reported on 09/06/2019) 224 tablet 0   No current facility-administered medications for this visit.    Family History Family History  Problem Relation Age of Onset  . Hypertension Mother   . Hyperlipidemia Mother   . Diabetes Mother   . Kidney disease Mother   . Hypertension Father   . Hyperlipidemia Father   . Diabetes  Brother   . Hypertension Brother   . Hyperlipidemia Brother   . Heart disease Maternal Grandmother   . COPD Maternal Grandmother   . Diabetes Maternal Grandfather   . Heart attack Paternal Grandmother   . Heart disease Paternal Grandfather       Social History Social History   Tobacco Use  . Smoking status: Former Smoker    Packs/day: 1.50    Years: 26.00    Pack years: 39.00    Types: E-cigarettes, Cigarettes    Start date: 1989    Quit date: 02/16/2014    Years since quitting: 5.8  . Smokeless tobacco: Never Used  Vaping Use  . Vaping Use: Former  .  Start date: 04/15/2015  . Quit date: 04/11/2019  . Substances: Nicotine  Substance Use Topics  . Alcohol use: Not Currently    Comment: 03/02/2014 "I rarely drink"  . Drug use: No        Review of Systems  Constitutional: Negative for malaise/fatigue and weight loss.  HENT: Negative.   Eyes: Negative.   Respiratory: Negative for cough, hemoptysis, sputum production, shortness of breath and wheezing.   Cardiovascular: Negative.   Gastrointestinal: Negative.   Genitourinary: Negative.   Skin: Negative for itching and rash.  Neurological: Negative.   Psychiatric/Behavioral: Negative.       Physical Exam Blood pressure 118/80, pulse 84, temperature 97.9 F (36.6 C), resp. rate 12, height 5' 10"  (1.778 m), weight (!) 327 lb (148.3 kg), SpO2 97 %. Last Weight  Most recent update: 12/08/2019 10:11 AM   Weight  148.3 kg (327 lb)              CONSTITUTIONAL: Well developed, and nourished, appropriately responsive and aware without distress.  Morbidly obese. EYES: Sclera non-icteric.   EARS, NOSE, MOUTH AND THROAT: Mask worn.    Hearing is intact to voice.  NECK: Trachea is midline, and there is no jugular venous distension.  LYMPH NODES:  Lymph nodes in the neck are not enlarged. RESPIRATORY:  Lungs are clear, and breath sounds are equal bilaterally. Normal respiratory effort without pathologic use of accessory muscles. CARDIOVASCULAR: Heart is regular in rate and rhythm. GI: The abdomen is  soft, nontender, and nondistended.  It is well rounded/obese. MUSCULOSKELETAL:  Symmetrical muscle tone appreciated in all four extremities.    SKIN: Skin turgor is normal. No pathologic skin lesions appreciated.  There is a 3 to 4 cm semicircular prominence on the medial aspect of the proximal forearm at his antecubital fossa, soft and fleshy and consistent with lipomatous tissue.  The lateral limit is not defined at all.  It seems to fade into the adjacent subcutaneous tissues laterally.  There  is no firmness, no appreciable definition or septation.  This is immediately underlying the antecubital complex of veins.  It is totally nontender, does not affect his range of motion.  It is not it on a surface where he bumps it or rubs against it inadvertently.  He denies a giving him any trouble whatsoever. NEUROLOGIC:  Motor and sensation appear grossly normal.  Cranial nerves are grossly without defect. PSYCH:  Alert and oriented to person, place and time. Affect is appropriate for situation.  Data Reviewed I have personally reviewed what is currently available of the patient's imaging, recent labs and medical records.   Labs:  CBC Latest Ref Rng & Units 10/07/2019 08/18/2019 08/17/2019  WBC 4.0 - 10.5 K/uL 10.6(H) 7.6 11.2(H)  Hemoglobin 13.0 - 17.0 g/dL 13.6 12.9(L)  14.4  Hematocrit 39 - 52 % 40.5 39.8 43.5  Platelets 150 - 400 K/uL 285.0 245 293   CMP Latest Ref Rng & Units 08/18/2019 08/17/2019 08/15/2019  Glucose 70 - 99 mg/dL 124(H) 129(H) 184(H)  BUN 6 - 20 mg/dL 16 21(H) 23(H)  Creatinine 0.61 - 1.24 mg/dL 0.84 0.87 0.86  Sodium 135 - 145 mmol/L 138 136 136  Potassium 3.5 - 5.1 mmol/L 3.5 3.5 3.6  Chloride 98 - 111 mmol/L 100 98 98  CO2 22 - 32 mmol/L 26 25 23   Calcium 8.9 - 10.3 mg/dL 8.5(L) 9.0 9.5  Total Protein 6.5 - 8.1 g/dL - 7.8 7.9  Total Bilirubin 0.3 - 1.2 mg/dL - 0.6 0.3  Alkaline Phos 38 - 126 U/L - 56 56  AST 15 - 41 U/L - 28 34  ALT 0 - 44 U/L - 36 33      Imaging:  Within last 24 hrs: No results found.  Assessment    Lipomatous tissue left forearm, asymptomatic. Patient Active Problem List   Diagnosis Date Noted  . Lipomatosis of subcutaneous tissue 12/08/2019  . Tinnitus of both ears 11/08/2019  . Abnormal MRI of the head 08/24/2019  . Acute superficial gastritis without hemorrhage 08/24/2019  . COPD mixed type ([place]) 04/04/2019  . Positive for macroalbuminuria 03/06/2019  . Vapes nicotine containing substance 03/03/2019  . Class 3 severe obesity due to  excess calories without serious comorbidity with body mass index (BMI) of 45.0 to 49.9 in adult [facility]) 03/03/2019  . Hyperlipidemia associated with type 2 diabetes mellitus ([place]) 03/03/2019  . Type 2 diabetes mellitus with diabetic polyneuropathy, without long-term current use of insulin ([place]) 04/13/2018  . Mild depression ([place]) 04/01/2018  . OSA (obstructive sleep apnea) 10/26/2017  . Anxiety disorder 10/26/2017  . Peripheral neuropathy 12/12/2016  . GAD (generalized anxiety disorder) 05/11/2015  . Former smoker 05/11/2015  . Other hyperlipidemia 05/22/2014  . Vitamin D insufficiency 05/22/2014  . Chronic intractable headache 05/19/2014  . Numbness and tingling of foot 05/19/2014  . Venous stasis dermatitis of both lower extremities 05/19/2014  . history of Hypertension 03/02/2014  . Morbid obesity ([place]) 03/02/2014    Plan    We discussed options of excision, risks and benefits of the procedure, expectations of recurrence.  Risks of venous injury, infection, scarring, recurrence. He has determined he would rather proceed with observation, now having reassurance of what it is.  He is reassured about its benignity.  We will be glad to see him back as needed.  Face-to-face time spent with the patient and accompanying care providers(if present) was 25 minutes, with more than 50% of the time spent counseling, educating, and coordinating care of the patient.      Ronny Bacon M.D., FACS 12/08/2019, 10:29 AM

## 2019-12-20 MED FILL — AMLODIPINE BESYLATE 10 MG T: 10 | 30 days supply | Qty: 30 | Fill #2

## 2019-12-20 MED FILL — ?LOSARTAN POT-HCTZ 100-12.5: 100-12.5 | 30 days supply | Qty: 30 | Fill #9

## 2019-12-20 MED FILL — ?ATORVASTATIN 40MG TABLET: 40 | 30 days supply | Qty: 30 | Fill #9

## 2019-12-21 ENCOUNTER — Other Ambulatory Visit: Payer: Self-pay

## 2019-12-21 ENCOUNTER — Telehealth (INDEPENDENT_AMBULATORY_CARE_PROVIDER_SITE_OTHER): Payer: Self-pay | Admitting: Psychiatry

## 2019-12-21 DIAGNOSIS — F411 Generalized anxiety disorder: Secondary | ICD-10-CM

## 2019-12-21 DIAGNOSIS — F32A Depression, unspecified: Secondary | ICD-10-CM

## 2019-12-21 DIAGNOSIS — F320 Major depressive disorder, single episode, mild: Secondary | ICD-10-CM

## 2019-12-21 MED ORDER — SERTRALINE HCL 100 MG PO TABS: 150.0000 mg | ORAL_TABLET | Freq: Every day | ORAL | 5 refills | Status: DC

## 2019-12-21 NOTE — Progress Notes (Signed)
[facility] MD/PA/NP OP Progress Note  12/21/2019 10:37 AM Juan Kline  MRN:  [id number] Interview was conducted by phone and I verified that I was speaking with the correct person using two identifiers. I discussed the limitations of evaluation and management by telemedicine and  the availability of in person appointments. Patient expressed understanding and agreed to proceed. Patient location - home; physician - home office.  Chief Complaint: "I am doing OK at this time".  HPI: 49 yo single male with hx of anxiety, some situational depression and occasional spells/episodes of shaking, tunnel vision, SOB, dizziness, lightheadedness. Patient has been doing fairly well on sertraline. He is frustrated that the workup done in the past by Dr. Charlotte Crumb (brain CT and MRI, EEG, spinal tap chemistries) was normal and he still has no answer as to what is causing him to have these "spells". He has been referred to [facility] for second opinion. He stopped working in April (at a [employer] part time) and is again consideringapplying for disability. He denies feeling particularly depressed or anxious as his "spells" have lately subsided. He prefers not to change the dose of sertraline. His sleep is satisfactory and he has not needed to take trazodone in months. There are no hallucinations or delusions present.Juan Kline has a hx of alcohol use disorderbutquit on his own 6 years ago.   Visit Diagnosis:    ICD-10-CM   1. GAD (generalized anxiety disorder)  F41.1   2. Mild depression (H[facility])  F32.0 sertraline (ZOLOFT) 100 MG tablet    Past Psychiatric History: Please see intake H&P.  Past Medical History:  Past Medical History:  Diagnosis Date  . Chronic foot ulcer ([place]) admitted 03/02/2014   medial border first metatarsal left foot  . Diabetes ([place])   . Diabetic foot infection ([place])   . GAD (generalized anxiety disorder)   . Hyperlipidemia   . Hypertension Dx 2009  . Mild depression ([place])   . OSA  (obstructive sleep apnea)    CPAP  . Osteomyelitis left first metatarsal 03/02/2014  . Sleep apnea   . Venous stasis ulcer of left lower extremity ([place])    Juan Kline 03/02/2014    Past Surgical History:  Procedure Laterality Date  . BIOPSY  08/18/2019   Procedure: BIOPSY;  Surgeon: Thornton Park, MD;  [facility];  Service: Gastroenterology;;  . ESOPHAGOGASTRODUODENOSCOPY (EGD) WITH PROPOFOL N/A 08/18/2019   Procedure: ESOPHAGOGASTRODUODENOSCOPY (EGD) WITH PROPOFOL;  Surgeon: Thornton Park, MD;  [facility];  Service: Gastroenterology;  Laterality: N/A;  . NO PAST SURGERIES      Family Psychiatric History: None.  Family History:  Family History  Problem Relation Age of Onset  . Hypertension Mother   . Hyperlipidemia Mother   . Diabetes Mother   . Kidney disease Mother   . Hypertension Father   . Hyperlipidemia Father   . Diabetes Brother   . Hypertension Brother   . Hyperlipidemia Brother   . Heart disease Maternal Grandmother   . COPD Maternal Grandmother   . Diabetes Maternal Grandfather   . Heart attack Paternal Grandmother   . Heart disease Paternal Grandfather     Social History:  Social History   Socioeconomic History  . Marital status: Single    Spouse name: Not on file  . Number of children: 0  . Years of education: Not on file  . Highest education level: Not on file  Occupational History  . Not on file  Tobacco Use  . Smoking status: Former Smoker  Packs/day: 1.50    Years: 26.00    Pack years: 39.00    Types: E-cigarettes, Cigarettes    Start date: 50    Quit date: 02/16/2014    Years since quitting: 5.8  . Smokeless tobacco: Never Used  Vaping Use  . Vaping Use: Former  . Start date: 04/15/2015  . Quit date: 04/11/2019  . Substances: Nicotine  Substance and Sexual Activity  . Alcohol use: Not Currently    Comment: 03/02/2014 "I rarely drink"  . Drug use: No  . Sexual activity: Not Currently  Other Topics Concern   . Not on file  Social History Narrative   Pt lives in 1 story home with his mother and step father   9th grade education   Last occupation was with [employer] in 2009      Unemployed at this time- thinking of filing for disability      Right handed      Social Determinants of Health   Financial Resource Strain:   . Difficulty of Paying Living Expenses: Not on file  Food Insecurity:   . Worried About Charity fundraiser in the Last Year: Not on file  . Ran Out of Food in the Last Year: Not on file  Transportation Needs:   . Lack of Transportation (Medical): Not on file  . Lack of Transportation (Non-Medical): Not on file  Physical Activity:   . Days of Exercise per Week: Not on file  . Minutes of Exercise per Session: Not on file  Stress:   . Feeling of Stress : Not on file  Social Connections:   . Frequency of Communication with Friends and Family: Not on file  . Frequency of Social Gatherings with Friends and Family: Not on file  . Attends Religious Services: Not on file  . Active Member of Clubs or Organizations: Not on file  . Attends Archivist Meetings: Not on file  . Marital Status: Not on file    Allergies:  Allergies  Allergen Reactions  . Lyrica [Pregabalin] Anxiety    Metabolic Disorder Labs: Lab Results  Component Value Date   HGBA1C 6.8 (H) 08/17/2019   MPG 148.46 08/17/2019   MPG 134 (H) 03/03/2014   No results found for: PROLACTIN Lab Results  Component Value Date   CHOL 200 (H) 03/04/2019   TRIG 255 (H) 03/04/2019   HDL 46 03/04/2019   CHOLHDL 4.3 03/04/2019   VLDL 65 (H) 02/28/2016   LDLCALC 110 (H) 03/04/2019   LDLCALC 163 (H) 12/10/2016   Lab Results  Component Value Date   TSH 4.170 08/13/2017   TSH 2.310 09/26/2016    Therapeutic Level Labs: No results found for: LITHIUM No results found for: VALPROATE No components found for:  CBMZ  Current Medications: Current Outpatient Medications  Medication Sig Dispense Refill   . albuterol (VENTOLIN HFA) 108 (90 Base) MCG/ACT inhaler Inhale 2 puffs into the lungs every 6 (six) hours as needed for wheezing or shortness of breath. 6.7 g 11  . amLODipine (NORVASC) 10 MG tablet TAKE 1 TABLET (10 MG TOTAL) BY MOUTH DAILY. 30 tablet 6  . atorvastatin (LIPITOR) 40 MG tablet Take 1 tablet (40 mg total) by mouth daily. 90 tablet 3  . Blood Glucose Monitoring Suppl (TRUE METRIX METER) w/Device KIT Use as directed 1 kit 0  . glucose blood (TRUE METRIX BLOOD GLUCOSE TEST) test strip Use as instructed 100 each 12  . losartan-hydrochlorothiazide (HYZAAR) 100-12.5 MG tablet Take 1 tablet  by mouth daily. 90 tablet 3  . omeprazole (PRILOSEC OTC) 20 MG tablet Take 2 tablets (40 mg total) by mouth 2 (two) times daily. (Patient not taking: Reported on 09/06/2019) 224 tablet 0  . sertraline (ZOLOFT) 100 MG tablet Take 1.5 tablets (150 mg total) by mouth daily. 45 tablet 5  . tiotropium (SPIRIVA) 18 MCG inhalation capsule Place 1 capsule (18 mcg total) into inhaler and inhale daily. (Patient taking differently: Place 18 mcg into inhaler and inhale daily as needed (SOB/Wheezing). ) 30 capsule 6  . TRUEPLUS LANCETS 28G MISC Use as directed 100 each 2   No current facility-administered medications for this visit.       Psychiatric Specialty Exam: Review of Systems  There were no vitals taken for this visit.There is no height or weight on file to calculate BMI.  General Appearance: NA  Eye Contact:  NA  Speech:  Clear and Coherent and Normal Rate  Volume:  Normal  Mood:  Euthymic  Affect:  NA  Thought Process:  Goal Directed  Orientation:  Full (Time, Place, and Person)  Thought Content: Logical   Suicidal Thoughts:  No  Homicidal Thoughts:  No  Memory:  Immediate;   Good Recent;   Good Remote;   Good  Judgement:  Good  Insight:  Fair  Psychomotor Activity:  NA  Concentration:  Concentration: Good  Recall:  Good  Fund of Knowledge: Good  Language: Good  Akathisia:  Negative   Handed:  Right  AIMS (if indicated): not done  Assets:  Communication Skills Desire for Improvement Housing Resilience  ADL's:  Intact  Cognition: WNL  Sleep:  Good   Screenings: GAD-7     Office Visit from 09/26/2019 in [place] Office Visit from 08/24/2019 in [place] Office Visit from 08/17/2019 in [place] Office Visit from 08/10/2019 in [place] Office Visit from 08/04/2019 in [place]  Total GAD-7 Score 2 0 0 0 0    PHQ2-9     Office Visit from 09/26/2019 in [place] Visit from 09/06/2019 in [facility] [place] Office Visit from 08/24/2019 in [place] Office Visit from 08/17/2019 in [place] Office Visit from 08/10/2019 in [place]  PHQ-2 Total Score 0 0 0 4 4  PHQ-9 Total Score 4 -- 1 8 9        Assessment and Plan: 49 yo single male with hx of anxiety, some situational depression and occasional spells/episodes of shaking, tunnel vision, SOB, dizziness, lightheadedness. Patient has been doing fairly well on sertraline. He is frustrated that the workup done in the past by Dr. Charlotte Crumb (brain CT and MRI, EEG, spinal tap chemistries) was normal and he still has no answer as to what is causing him to have these "spells". He has been referred to [facility] for second opinion. He stopped working in April (at a [employer] part time) and is again consideringapplying for disability. He denies feeling particularly depressed or anxious as his "spells" have lately subsided. He prefers not to change the dose of sertraline. His sleep is satisfactory and he has not needed to take trazodone in months. There are no hallucinations or delusions present.Juan Kline has a hx of alcohol use  disorderbutquit on his own 6 years ago.   Dx: Generalized anxiety  disorder;Depressive disorder unspecified, in remission; Alcohol use disorder insustainedremission; r/o somatoform disorder  Plan:Continuesertraline 150 mg daily. Next appointment in14month.The plan was discussed with patient who had an opportunity to ask questions and these were all answered. I spend175mutes inphone consultationwiththe patient.    OlStephanie AcreMD 12/21/2019, 10:37 AM

## 2020-01-04 MED FILL — TOPIRAMATE 25 MG TABS: 25 | 33 days supply | Qty: 90 | Fill #0

## 2020-01-06 ENCOUNTER — Ambulatory Visit: Payer: No Typology Code available for payment source

## 2020-01-06 ENCOUNTER — Other Ambulatory Visit: Payer: Self-pay

## 2020-01-13 MED FILL — AMLODIPINE BESYLATE 10 MG T: 10 | 30 days supply | Qty: 30 | Fill #3

## 2020-01-13 MED FILL — ?LOSARTAN POT-HCTZ 100-12.5: 100-12.5 | 30 days supply | Qty: 30 | Fill #10

## 2020-01-13 MED FILL — ?ATORVASTATIN 40MG TABLET: 40 | 30 days supply | Qty: 30 | Fill #10

## 2020-01-17 ENCOUNTER — Other Ambulatory Visit: Payer: Self-pay | Admitting: Gastroenterology

## 2020-01-17 ENCOUNTER — Other Ambulatory Visit: Payer: Self-pay

## 2020-01-17 ENCOUNTER — Ambulatory Visit (AMBULATORY_SURGERY_CENTER): Payer: Self-pay | Admitting: *Deleted

## 2020-01-17 ENCOUNTER — Ambulatory Visit: Payer: No Typology Code available for payment source | Admitting: Internal Medicine

## 2020-01-17 VITALS — Ht 70.0 in | Wt 328.0 lb

## 2020-01-17 DIAGNOSIS — Z1211 Encounter for screening for malignant neoplasm of colon: Secondary | ICD-10-CM

## 2020-01-17 DIAGNOSIS — Z01818 Encounter for other preprocedural examination: Secondary | ICD-10-CM

## 2020-01-17 MED ORDER — NA SULFATE-K SULFATE-MG SULF 17.5-3.13-1.6 GM/177ML PO SOLN: 1.0000 | Freq: Once | ORAL | 0 refills | Status: DC

## 2020-01-17 MED FILL — SUPREP BOWEL PREP KIT: 17.5-3.13-1 | 1 days supply | Qty: 344 | Fill #0

## 2020-01-17 NOTE — Progress Notes (Signed)

## 2020-01-20 MED FILL — SERTRALINE HCL 100 MG TAB: 100 | 30 days supply | Qty: 45 | Fill #2

## 2020-01-23 ENCOUNTER — Ambulatory Visit: Payer: No Typology Code available for payment source | Admitting: Family Medicine

## 2020-01-24 ENCOUNTER — Encounter: Payer: Self-pay | Admitting: Internal Medicine

## 2020-01-24 ENCOUNTER — Other Ambulatory Visit: Payer: Self-pay

## 2020-01-24 ENCOUNTER — Ambulatory Visit: Payer: Self-pay | Attending: Internal Medicine | Admitting: Internal Medicine

## 2020-01-24 ENCOUNTER — Telehealth: Payer: Self-pay | Admitting: Allergy & Immunology

## 2020-01-24 ENCOUNTER — Ambulatory Visit (HOSPITAL_BASED_OUTPATIENT_CLINIC_OR_DEPARTMENT_OTHER): Payer: No Typology Code available for payment source | Admitting: Pharmacist

## 2020-01-24 VITALS — BP 120/82 | HR 88 | Resp 16 | Wt 325.0 lb

## 2020-01-24 DIAGNOSIS — I10 Essential (primary) hypertension: Secondary | ICD-10-CM

## 2020-01-24 DIAGNOSIS — Z23 Encounter for immunization: Secondary | ICD-10-CM

## 2020-01-24 DIAGNOSIS — F419 Anxiety disorder, unspecified: Secondary | ICD-10-CM

## 2020-01-24 DIAGNOSIS — Z8669 Personal history of other diseases of the nervous system and sense organs: Secondary | ICD-10-CM

## 2020-01-24 DIAGNOSIS — E1142 Type 2 diabetes mellitus with diabetic polyneuropathy: Secondary | ICD-10-CM

## 2020-01-24 DIAGNOSIS — G4733 Obstructive sleep apnea (adult) (pediatric): Secondary | ICD-10-CM

## 2020-01-24 DIAGNOSIS — T50B95A Adverse effect of other viral vaccines, initial encounter: Secondary | ICD-10-CM

## 2020-01-24 LAB — GLUCOSE, POCT (MANUAL RESULT ENTRY): POC Glucose: 127 mg/dl — AB (ref 70–99)

## 2020-01-24 MED FILL — SUPREP BOWEL PREP KIT: 17.5-3.13-1 | 1 days supply | Qty: 354 | Fill #0

## 2020-01-24 NOTE — Telephone Encounter (Signed)
Referral sent from Dr. Karle Plumber for adverse reaction to COVID-19 vaccine. Please review to ensure if covid component testing is needed.

## 2020-01-24 NOTE — Telephone Encounter (Signed)
Called and left message for patient to call back to discuss COVID-19 testing algorithm

## 2020-01-24 NOTE — Patient Instructions (Signed)
Influenza Virus Vaccine injection (Fluarix) What is this medicine? INFLUENZA VIRUS VACCINE (in floo EN zuh VAHY ruhs vak SEEN) helps to reduce the risk of getting influenza also known as the flu. This medicine may be used for other purposes; ask your health care provider or pharmacist if you have questions. COMMON BRAND NAME(S): Fluarix, Fluzone What should I tell my health care provider before I take this medicine? They need to know if you have any of these conditions:  bleeding disorder like hemophilia  fever or infection  Guillain-Barre syndrome or other neurological problems  immune system problems  infection with the human immunodeficiency virus (HIV) or AIDS  low blood platelet counts  multiple sclerosis  an unusual or allergic reaction to influenza virus vaccine, eggs, chicken proteins, latex, gentamicin, other medicines, foods, dyes or preservatives  pregnant or trying to get pregnant  breast-feeding How should I use this medicine? This vaccine is for injection into a muscle. It is given by a health care professional. A copy of Vaccine Information Statements will be given before each vaccination. Read this sheet carefully each time. The sheet may change frequently. Talk to your pediatrician regarding the use of this medicine in children. Special care may be needed. Overdosage: If you think you have taken too much of this medicine contact a poison control center or emergency room at once. NOTE: This medicine is only for you. Do not share this medicine with others. What if I miss a dose? This does not apply. What may interact with this medicine?  chemotherapy or radiation therapy  medicines that lower your immune system like etanercept, anakinra, infliximab, and adalimumab  medicines that treat or prevent blood clots like warfarin  phenytoin  steroid medicines like prednisone or cortisone  theophylline  vaccines This list may not describe all possible  interactions. Give your health care provider a list of all the medicines, herbs, non-prescription drugs, or dietary supplements you use. Also tell them if you smoke, drink alcohol, or use illegal drugs. Some items may interact with your medicine. What should I watch for while using this medicine? Report any side effects that do not go away within 3 days to your doctor or health care professional. Call your health care provider if any unusual symptoms occur within 6 weeks of receiving this vaccine. You may still catch the flu, but the illness is not usually as bad. You cannot get the flu from the vaccine. The vaccine will not protect against colds or other illnesses that may cause fever. The vaccine is needed every year. What side effects may I notice from receiving this medicine? Side effects that you should report to your doctor or health care professional as soon as possible:  allergic reactions like skin rash, itching or hives, swelling of the face, lips, or tongue Side effects that usually do not require medical attention (report to your doctor or health care professional if they continue or are bothersome):  fever  headache  muscle aches and pains  pain, tenderness, redness, or swelling at site where injected  weak or tired This list may not describe all possible side effects. Call your doctor for medical advice about side effects. You may report side effects to FDA at 1-800-FDA-1088. Where should I keep my medicine? This vaccine is only given in a clinic, pharmacy, doctor's office, or other health care setting and will not be stored at home. NOTE: This sheet is a summary. It may not cover all possible information. If you have questions   about this medicine, talk to your doctor, pharmacist, or health care provider.  2020 Elsevier/Gold Standard (2007-10-27 09:30:40)  

## 2020-01-24 NOTE — Progress Notes (Signed)
Patient presents for vaccination against influenza per orders of Dr. Johnson. Consent given. Counseling provided. No contraindications exists. Vaccine administered without incident.  ° °Luke Van Ausdall, PharmD, CPP °Clinical Pharmacist °[facility] °[phone] ° °

## 2020-01-24 NOTE — Progress Notes (Signed)
Patient ID: Juan Kline, male    DOB: 1970-04-19  MRN: [id number]  CC: Diabetes and Hypertension   Subjective: Juan Kline is a 49 y.o. male who presents for chronic ds management His concerns today include:  HTN, HL,DM type 2with neuropathy,anxiety, chronic fatigue, severe OSAon BiPAP, vapes, mix COPD, obesity, gastritis (EGD 08/2019).    Headache/dizziness: Since last visit with me, he was seen by neurology at [facility]  Diagnosed with migraines with vestibular component. -They started him on Topamax 25 mg with increase in dosage of 25 mg/week until he gets to 100 mg.  They also recommended he increase his hydration and consistent use of CPAP. -He has not started the Topamax as yet.  States he is afraid of taking all of this medication with everything that he has going on.  He will be having a colonoscopy next week so we held off on starting the Topamax. -He wonders why his studies are coming back okay but he feels that there is something wrong.  "I am not a hypochondriac."  Wonders if there is an environmental exposure that may be causing his symptoms.  He does not note any mold in his environment at home. -Admits to though that he is feeling better compared to a few months ago.  "I am not bedridden like I was for a while." -Feels anxious and emotional about his medical issues.  He sees a psychiatrist routinely.  He is on Zoloft.  Not using BiPAP often because he feels he is getting too much air.  Wants to use the machine but uncomfortable feeling of too much air pressure when he puts the mask on.  He does not know how to adjust the pressure setting on the device.   Thinking about getting 2nd shot of Pfizer vaccine.  He held off on doing so after he became ill with diarrhea for 3 weeks after receiving the first shot.  Wants to know if there is some way we can determine whether he would have similar reaction should he get the second shot.      Obesity/diabetes:  Would like to  go to [employer] and [employer].  Willing to pay small fee if he has to " I need to get this wgh off." Has cut back on portions, sugar and salt intake.  Would like an eating plan -off Metformin since May Checking BS several times a wk.  Range 100-150. -Not as active as he would like due to his chronic medical issues.  HTN: Compliant with Norvasc and Cozaar/HCTZ.  Tries to limit salt in the foods.  Patient Active Problem List   Diagnosis Date Noted  . Lipomatosis of subcutaneous tissue 12/08/2019  . Tinnitus of both ears 11/08/2019  . Abnormal MRI of the head 08/24/2019  . Acute superficial gastritis without hemorrhage 08/24/2019  . COPD mixed type ([place]) 04/04/2019  . Positive for macroalbuminuria 03/06/2019  . Vapes nicotine containing substance 03/03/2019  . Class 3 severe obesity due to excess calories without serious comorbidity with body mass index (BMI) of 45.0 to 49.9 in adult [facility]) 03/03/2019  . Hyperlipidemia associated with type 2 diabetes mellitus ([place]) 03/03/2019  . Type 2 diabetes mellitus with diabetic polyneuropathy, without long-term current use of insulin ([place]) 04/13/2018  . Mild depression ([place]) 04/01/2018  . OSA (obstructive sleep apnea) 10/26/2017  . Anxiety disorder 10/26/2017  . Peripheral neuropathy 12/12/2016  . GAD (generalized anxiety disorder) 05/11/2015  . Former smoker 05/11/2015  . Other hyperlipidemia 05/22/2014  .  Vitamin D insufficiency 05/22/2014  . Chronic intractable headache 05/19/2014  . Numbness and tingling of foot 05/19/2014  . Venous stasis dermatitis of both lower extremities 05/19/2014  . history of Hypertension 03/02/2014  . Morbid obesity ([place]) 03/02/2014     Current Outpatient Medications on File Prior to Visit  Medication Sig Dispense Refill  . albuterol (VENTOLIN HFA) 108 (90 Base) MCG/ACT inhaler Inhale 2 puffs into the lungs every 6 (six) hours as needed for wheezing or shortness of breath. 6.7 g 11  . amLODipine (NORVASC) 10 MG  tablet TAKE 1 TABLET (10 MG TOTAL) BY MOUTH DAILY. 30 tablet 6  . atorvastatin (LIPITOR) 40 MG tablet Take 1 tablet (40 mg total) by mouth daily. 90 tablet 3  . Blood Glucose Monitoring Suppl (TRUE METRIX METER) w/Device KIT Use as directed (Patient not taking: Reported on 01/17/2020) 1 kit 0  . glucose blood (TRUE METRIX BLOOD GLUCOSE TEST) test strip Use as instructed (Patient not taking: Reported on 01/17/2020) 100 each 12  . losartan-hydrochlorothiazide (HYZAAR) 100-12.5 MG tablet Take 1 tablet by mouth daily. 90 tablet 3  . omeprazole (PRILOSEC OTC) 20 MG tablet Take 2 tablets (40 mg total) by mouth 2 (two) times daily. (Patient not taking: Reported on 09/06/2019) 224 tablet 0  . sertraline (ZOLOFT) 100 MG tablet Take 1.5 tablets (150 mg total) by mouth daily. 45 tablet 5  . tiotropium (SPIRIVA) 18 MCG inhalation capsule Place 1 capsule (18 mcg total) into inhaler and inhale daily. (Patient taking differently: Place 18 mcg into inhaler and inhale daily as needed (SOB/Wheezing). ) 30 capsule 6  . topiramate (TOPAMAX) 25 MG tablet Start 38m nightly x 1 week. Then increase to 560mnightly x 1 week. Then increase to 7578mightly x 1 week. Then increase to 100m66mghtly x week. (Patient not taking: Reported on 01/17/2020)    . TRUEPLUS LANCETS 28G MISC Use as directed (Patient not taking: Reported on 01/17/2020) 100 each 2   No current facility-administered medications on file prior to visit.    Allergies  Allergen Reactions  . Lyrica [Pregabalin] Anxiety    Social History   Socioeconomic History  . Marital status: Single    Spouse name: Not on file  . Number of children: 0  . Years of education: Not on file  . Highest education level: Not on file  Occupational History  . Not on file  Tobacco Use  . Smoking status: Former Smoker    Packs/day: 1.50    Years: 26.00    Pack years: 39.00    Types: E-cigarettes, Cigarettes    Start date: 1989    Quit date: 02/16/2014    Years since  quitting: 5.9  . Smokeless tobacco: Never Used  Vaping Use  . Vaping Use: Every day  . Start date: 04/15/2015  . Last attempt to quit: 04/11/2019  . Substances: Nicotine  Substance and Sexual Activity  . Alcohol use: Not Currently    Comment: 03/02/2014 "I rarely drink"  . Drug use: No  . Sexual activity: Not Currently  Other Topics Concern  . Not on file  Social History Narrative   Pt lives in 1 story home with his mother and step father   9th grade education   Last occupation was with [employer]2009      Unemployed at this time- thinking of filing for disability      Right handed      Social Determinants of Health   Financial Resource Strain:   .  Difficulty of Paying Living Expenses: Not on file  Food Insecurity:   . Worried About Charity fundraiser in the Last Year: Not on file  . Ran Out of Food in the Last Year: Not on file  Transportation Needs:   . Lack of Transportation (Medical): Not on file  . Lack of Transportation (Non-Medical): Not on file  Physical Activity:   . Days of Exercise per Week: Not on file  . Minutes of Exercise per Session: Not on file  Stress:   . Feeling of Stress : Not on file  Social Connections:   . Frequency of Communication with Friends and Family: Not on file  . Frequency of Social Gatherings with Friends and Family: Not on file  . Attends Religious Services: Not on file  . Active Member of Clubs or Organizations: Not on file  . Attends Archivist Meetings: Not on file  . Marital Status: Not on file  Intimate Partner Violence:   . Fear of Current or Ex-Partner: Not on file  . Emotionally Abused: Not on file  . Physically Abused: Not on file  . Sexually Abused: Not on file    Family History  Problem Relation Age of Onset  . Hypertension Mother   . Hyperlipidemia Mother   . Diabetes Mother   . Kidney disease Mother   . Hypertension Father   . Hyperlipidemia Father   . Diabetes Brother   . Hypertension Brother     . Hyperlipidemia Brother   . Heart disease Maternal Grandmother   . COPD Maternal Grandmother   . Diabetes Maternal Grandfather   . Heart attack Paternal Grandmother   . Heart disease Paternal Grandfather   . Colon cancer Neg Hx   . Colon polyps Neg Hx   . Stomach cancer Neg Hx   . Esophageal cancer Neg Hx     Past Surgical History:  Procedure Laterality Date  . BIOPSY  08/18/2019   Procedure: BIOPSY;  Surgeon: Thornton Park, MD;  Location: [facility];  Service: Gastroenterology;;  . ESOPHAGOGASTRODUODENOSCOPY (EGD) WITH PROPOFOL N/A 08/18/2019   Procedure: ESOPHAGOGASTRODUODENOSCOPY (EGD) WITH PROPOFOL;  Surgeon: Thornton Park, MD;  Location: [facility];  Service: Gastroenterology;  Laterality: N/A;  . NO PAST SURGERIES      ROS: Review of Systems Negative except as stated above  PHYSICAL EXAM: BP 120/82   Pulse 88   Resp 16   Wt (!) 325 lb (147.4 kg)   SpO2 95%   BMI 46.63 kg/m   Wt Readings from Last 3 Encounters:  01/24/20 (!) 325 lb (147.4 kg)  01/17/20 (!) 328 lb (148.8 kg)  12/08/19 (!) 327 lb (148.3 kg)    Physical Exam  General appearance - alert, well appearing, middle-age Caucasian male and in no distress Mental status - normal mood, behavior, speech, dress, motor activity, and thought processes Chest - clear to auscultation, no wheezes, rales or rhonchi, symmetric air entry Heart - normal rate, regular rhythm, normal S1, S2, no murmurs, rubs, clicks or gallops Extremities - peripheral pulses normal, no pedal edema, no clubbing or cyanosis  Results for orders placed or performed in visit on 01/24/20  POCT glucose (manual entry)  Result Value Ref Range   POC Glucose 127 (A) 70 - 99 mg/dl   A1C today: 6.5 Lab Results  Component Value Date   HGBA1C 6.8 (H) 08/17/2019    CMP Latest Ref Rng & Units 08/18/2019 08/17/2019 08/15/2019  Glucose 70 - 99 mg/dL 124(H) 129(H) 184(H)  BUN 6 - 20 mg/dL 16 21(H) 23(H)  Creatinine 0.61 - 1.24 mg/dL 0.84 0.87  0.86  Sodium 135 - 145 mmol/L 138 136 136  Potassium 3.5 - 5.1 mmol/L 3.5 3.5 3.6  Chloride 98 - 111 mmol/L 100 98 98  CO2 22 - 32 mmol/L 26 25 23   Calcium 8.9 - 10.3 mg/dL 8.5(L) 9.0 9.5  Total Protein 6.5 - 8.1 g/dL - 7.8 7.9  Total Bilirubin 0.3 - 1.2 mg/dL - 0.6 0.3  Alkaline Phos 38 - 126 U/L - 56 56  AST 15 - 41 U/L - 28 34  ALT 0 - 44 U/L - 36 33   Lipid Panel     Component Value Date/Time   CHOL 200 (H) 03/04/2019 1107   TRIG 255 (H) 03/04/2019 1107   HDL 46 03/04/2019 1107   CHOLHDL 4.3 03/04/2019 1107   CHOLHDL 5.6 (H) 02/28/2016 1244   VLDL 65 (H) 02/28/2016 1244   LDLCALC 110 (H) 03/04/2019 1107    CBC    Component Value Date/Time   WBC 10.6 (H) 10/07/2019 1229   RBC 4.88 10/07/2019 1229   HGB 13.6 10/07/2019 1229   HGB 14.5 08/10/2019 1146   HCT 40.5 10/07/2019 1229   HCT 42.4 08/10/2019 1146   PLT 285.0 10/07/2019 1229   PLT 301 08/10/2019 1146   MCV 83.0 10/07/2019 1229   MCV 81 08/10/2019 1146   MCH 27.6 08/18/2019 0408   MCHC 33.5 10/07/2019 1229   RDW 14.3 10/07/2019 1229   RDW 13.4 08/10/2019 1146   LYMPHSABS 2.4 08/14/2019 0141   LYMPHSABS 2.0 08/10/2019 1146   MONOABS 0.8 08/14/2019 0141   EOSABS 0.2 08/14/2019 0141   EOSABS 0.1 08/10/2019 1146   BASOSABS 0.0 08/14/2019 0141   BASOSABS 0.0 08/10/2019 1146    ASSESSMENT AND PLAN: 1. Type 2 diabetes mellitus with diabetic polyneuropathy, without long-term current use of insulin (HCC) Remains controlled off med.  Discussed and encourage healthy eating habits.  Referral to medical weight management. - POCT glucose (manual entry)  2. Essential hypertension Close to goal.  Continue current medications  3. Morbid obesity ([place]) See #1 above - Amb Ref to Medical Weight Management  4. Anxiety disorder, unspecified type Followed by psychiatry.  On Zoloft. Advised patient that just because studies come back negative does not mean that he is a hypochondriac.  He does have anxiety.  In regards to  his headache, he has seen 2 neurologists.  He has been diagnosed with probable migraines which can be treated.  I have encouraged him to start the Topamax once he is completed colonoscopy next week.  5. Hx of migraine headaches Advised patient to start the Topamax after his colonoscopy next week.  6. Adverse reaction to COVID-19 vaccine We will refer him to an allergist to see whether he needs testing before getting his second [place] vaccine - Ambulatory referral to Allergy  7. OSA (obstructive sleep apnea) We will refer for mask desensitization to see if he would do better with a nasal mask.  I will also have him follow-up with Dr. Joya Gaskins to see if he would recommend any further suggestions.  Patient got the BiPAP machine through charity program with our caseworker.  I do not think they do any maintenance or adjustments to the machines once they are given to the patient. - Desensitization mask fit; Future    Patient was given the opportunity to ask questions.  Patient verbalized understanding of the plan and was able to  repeat key elements of the plan.   Orders Placed This Encounter  Procedures  . Ambulatory referral to Allergy  . Amb Ref to Medical Weight Management  . POCT glucose (manual entry)  . Desensitization mask fit     Requested Prescriptions    No prescriptions requested or ordered in this encounter    Return in about 3 months (around 04/25/2020) for Give appt with Dr. Joya Gaskins in 2 weeks for OSA.  Karle Plumber, MD, FACP

## 2020-01-25 LAB — POCT GLYCOSYLATED HEMOGLOBIN (HGB A1C): HbA1c, POC (controlled diabetic range): 6.5 % (ref 0.0–7.0)

## 2020-01-25 NOTE — Addendum Note (Signed)
Addended by: Jackelyn Knife on: 01/25/2020 08:37 AM   Modules accepted: Orders

## 2020-01-25 NOTE — Telephone Encounter (Signed)
Noted. If he ends up asking a lot of questions, you can just add him as a televisit or something.   Salvatore Marvel, MD [facility]

## 2020-01-26 ENCOUNTER — Other Ambulatory Visit: Payer: Self-pay | Admitting: Internal Medicine

## 2020-01-26 ENCOUNTER — Telehealth: Payer: Self-pay | Admitting: Internal Medicine

## 2020-01-26 ENCOUNTER — Telehealth: Payer: Self-pay | Admitting: Gastroenterology

## 2020-01-26 MED ORDER — CYCLOBENZAPRINE HCL 5 MG PO TABS: 5.0000 mg | ORAL_TABLET | Freq: Every day | ORAL | 1 refills | Status: DC | PRN

## 2020-01-26 MED FILL — CYCLOBENZAPRINE 5 MG TABLET: 5 | 30 days supply | Qty: 30 | Fill #0

## 2020-01-26 NOTE — Telephone Encounter (Signed)
This needs to go to the person who originally scheduled the appointment.

## 2020-01-26 NOTE — Telephone Encounter (Signed)
Copied from [place] [id number]. Topic: General - Other >> Jan 26, 2020  9:46 AM Rainey Pines A wrote: Patient wants to know if there is anyway a muscle relaxer can be called in for him to hold him over until his upcoming appt on 10/26. Patient stated that he still has some muscle relaxers left  that he was prescribed before but is unsure if it is safe to take them. Please Arnoldo Hooker

## 2020-01-26 NOTE — Telephone Encounter (Signed)
Pt aware and will pick up script

## 2020-01-26 NOTE — Telephone Encounter (Signed)
Will forward to pcp

## 2020-01-26 NOTE — Telephone Encounter (Signed)
Spoke with pt and he will have covid test done 02-02-20 at 10:30 am  Left message at testing to set this up- included pt's name, DOB, MRN, MD, date and time

## 2020-01-26 NOTE — Telephone Encounter (Signed)
Hi Dr. Tarri Glenn, this pt had to cancel his colon that was scheduled with you next 10/19 because he will not being able to have his Covid test today due to begin experiencing leg spasms and not feeling well. He is r/s to 10/25. Thank you.

## 2020-01-26 NOTE — Telephone Encounter (Signed)
Pls call pt to r/s his Covid test. He had to r/s his procedure with Dr. Tarri Glenn to 10/25 because he is not feeling well today, he has been experiencing leg spasm. Pls call him.

## 2020-01-26 NOTE — Telephone Encounter (Signed)
Thanks for the update.  KLB 

## 2020-01-26 NOTE — Telephone Encounter (Signed)
Good afternoon, could one of you please help this pt? It looks like I routed it incorrectly. Thank you.

## 2020-01-27 NOTE — Telephone Encounter (Signed)
Within a couple of days of the shot had diarrhea for a couple of weeks, felt sickly, had a heachahce, no vomiting no fever. Never had a colonscopy. Pt had pfizer first dose only

## 2020-01-27 NOTE — Telephone Encounter (Signed)
Symptoms lasting a couple of weeks after a potentially allergic trigger does not really sound like an allergy. Therefore I do not think that testing would answer the question and is not needed. We could give it to him under supervision if he would like at one of our [place] vaccine clinics. I would recommend that he at least take 1-2 tablets of cetirizine or another antihistamine 30 minutes before his next vaccine.   Salvatore Marvel, MD [facility] [place]

## 2020-01-27 NOTE — Telephone Encounter (Signed)
Called and left a message for patient to call our office back to discuss his Covid-19 vaccine options

## 2020-01-31 ENCOUNTER — Encounter: Payer: No Typology Code available for payment source | Admitting: Gastroenterology

## 2020-02-02 ENCOUNTER — Other Ambulatory Visit: Payer: Self-pay | Admitting: Gastroenterology

## 2020-02-02 ENCOUNTER — Ambulatory Visit: Payer: Self-pay | Admitting: Neurology

## 2020-02-02 LAB — SARS CORONAVIRUS 2 (TAT 6-24 HRS): SARS Coronavirus 2: NEGATIVE

## 2020-02-06 ENCOUNTER — Telehealth: Payer: Self-pay | Admitting: Gastroenterology

## 2020-02-06 ENCOUNTER — Encounter: Payer: No Typology Code available for payment source | Admitting: Gastroenterology

## 2020-02-06 NOTE — Telephone Encounter (Signed)
Lm for pt to call us back  

## 2020-02-06 NOTE — Telephone Encounter (Signed)
Please call the patient. If he is prepped, we should still be able to proceed with his colonoscopy. The medications used for sedation are likely to help abort his migraine.

## 2020-02-06 NOTE — Telephone Encounter (Signed)
Called patient back and explained that if he was prepped we could still do the procedure and the propofol would likely ease his migraine along with there fluids that we start in admitting. Patient said his migraine started yesterday and he did not prep and had already reschedule his procedure for later in the year. Will let MD know.

## 2020-02-06 NOTE — Telephone Encounter (Signed)
Patient called to cancel the procedure for today states he has Migranes and will be starting medication for it today rescheduled

## 2020-02-07 ENCOUNTER — Ambulatory Visit
Payer: No Typology Code available for payment source | Attending: Critical Care Medicine | Admitting: Critical Care Medicine

## 2020-02-07 ENCOUNTER — Encounter: Payer: Self-pay | Admitting: Critical Care Medicine

## 2020-02-07 ENCOUNTER — Other Ambulatory Visit: Payer: Self-pay

## 2020-02-07 ENCOUNTER — Encounter: Payer: Self-pay | Admitting: Allergy & Immunology

## 2020-02-07 ENCOUNTER — Ambulatory Visit (INDEPENDENT_AMBULATORY_CARE_PROVIDER_SITE_OTHER): Payer: No Typology Code available for payment source | Admitting: Allergy & Immunology

## 2020-02-07 VITALS — BP 139/93 | HR 94 | Temp 98.2°F | Ht 70.1 in | Wt 319.4 lb

## 2020-02-07 DIAGNOSIS — G4733 Obstructive sleep apnea (adult) (pediatric): Secondary | ICD-10-CM

## 2020-02-07 DIAGNOSIS — R5383 Other fatigue: Secondary | ICD-10-CM

## 2020-02-07 DIAGNOSIS — T50Z95D Adverse effect of other vaccines and biological substances, subsequent encounter: Secondary | ICD-10-CM

## 2020-02-07 DIAGNOSIS — G43909 Migraine, unspecified, not intractable, without status migrainosus: Secondary | ICD-10-CM

## 2020-02-07 NOTE — Progress Notes (Signed)
Subjective:    Patient ID: Juan Kline, male    DOB: 09-May-1970, 49 y.o.   MRN: [id number] History of Present Illness: 49 y.o.M  HTN COPD OSA T2DM 08/04/19 This is a telemedicine video visit The patient gives a history of having a Pfizer vaccine his first dose on April 2 and he states within 2 days he began having diarrhea fatigue headaches loss of taste and smell nausea muscle aches and severe fatigue The patient has another dose of Covid vaccine due on April 23  The patient has been staying out of work at this time.  He works at [employer] and is exposed to various customers.  He has been keeping up with his fluids.  He denies chest pain.  He states his dyspnea is stable at this time.  He denies a cough.  His diarrhea though is quite excessive.   08/10/2019 The patient returns today for a face-to-face visit to evaluate the patient's abdominal pain and ongoing diarrhea  The patient's continues to have loose stools without blood in the stools.  There is nausea without vomiting.  He has abdominal pain in the epigastric area.  His Covid test did come back negative.  I saw him on a video visit yesterday but need to bring him to the office for a physical exam.  Previous history of morbid obesity and hypertension along with sleep apnea and COPD mixed type with associated history of diabetes peripheral neuropathy and hyperlipidemia  08/17/2019 Patient returns today with progressive epigastric pain nausea without vomiting episodic tarry stools but decrease in diarrhea.  The patient continues to have a sense of fogginess in his thinking but no overt headaches.  He did have chest pain when his blood pressure was in the 1 7110 range the pain is improved now pain is in the anterior sternal area with radiation down the right arm.  He remains very weak and is taking in some nourishment but overall is not really improving.  He has been to the emergency room 3 times over the past 5 days.  Troponin  studies and ECGs were unremarkable but there is concern over coronary disease with his diabetes.  I held his Metformin because of his nausea and his glucoses have been in the 160 range off Metformin.  Note CT scan of the abdomen was unremarkable and lipase has been elevated but it dropped back down again with most recent ED visit.  The patient states with the Protonix that he was given from the emergency room his tarry stools are less but still has significant epigastric pain  02/07/2020 This patient was seen in return follow-up and referred by primary care for evaluation of difficulties with the patient's CPAP machine the patient is actually on BiPAP with a setting of 19/15 and is followed by sleep medicine  He states the pressure appears to be increasing and is too much.  He is not as fatigued on the BiPAP.  He does have increased migraine headache symptoms    Past Medical History:  Diagnosis Date  . Chronic foot ulcer ([place]) admitted 03/02/2014   medial border first metatarsal left foot  . Diabetes ([place])   . Diabetic foot infection ([place])   . GAD (generalized anxiety disorder)   . Hyperlipidemia   . Hypertension Dx 2009  . Mild depression ([place])   . OSA (obstructive sleep apnea)    CPAP  . Osteomyelitis left first metatarsal 03/02/2014  . Sleep apnea    uses CPAP but  not every night  . Venous stasis ulcer of left lower extremity ([place])    [name] 03/02/2014     Family History  Problem Relation Age of Onset  . Hypertension Mother   . Hyperlipidemia Mother   . Diabetes Mother   . Kidney disease Mother   . Hypertension Father   . Hyperlipidemia Father   . Diabetes Brother   . Hypertension Brother   . Hyperlipidemia Brother   . Heart disease Maternal Grandmother   . COPD Maternal Grandmother   . Diabetes Maternal Grandfather   . Heart attack Paternal Grandmother   . Heart disease Paternal Grandfather   . Colon cancer Neg Hx   . Colon polyps Neg Hx   . Stomach cancer Neg Hx    . Esophageal cancer Neg Hx      Social History   Socioeconomic History  . Marital status: Single    Spouse name: Not on file  . Number of children: 0  . Years of education: Not on file  . Highest education level: Not on file  Occupational History  . Not on file  Tobacco Use  . Smoking status: Former Smoker    Packs/day: 1.50    Years: 26.00    Pack years: 39.00    Types: E-cigarettes, Cigarettes    Start date: 1989    Quit date: 02/16/2014    Years since quitting: 5.9  . Smokeless tobacco: Never Used  Vaping Use  . Vaping Use: Every day  . Start date: 04/15/2015  . Last attempt to quit: 04/11/2019  . Substances: Nicotine  Substance and Sexual Activity  . Alcohol use: Not Currently    Comment: 03/02/2014 "I rarely drink"  . Drug use: No  . Sexual activity: Not Currently  Other Topics Concern  . Not on file  Social History Narrative   Pt lives in 1 story home with his mother and step father   9th grade education   Last occupation was with [employer] in 2009      Unemployed at this time- thinking of filing for disability      Right handed      Social Determinants of Health   Financial Resource Strain:   . Difficulty of Paying Living Expenses: Not on file  Food Insecurity:   . Worried About Charity fundraiser in the Last Year: Not on file  . Ran Out of Food in the Last Year: Not on file  Transportation Needs:   . Lack of Transportation (Medical): Not on file  . Lack of Transportation (Non-Medical): Not on file  Physical Activity:   . Days of Exercise per Week: Not on file  . Minutes of Exercise per Session: Not on file  Stress:   . Feeling of Stress : Not on file  Social Connections:   . Frequency of Communication with Friends and Family: Not on file  . Frequency of Social Gatherings with Friends and Family: Not on file  . Attends Religious Services: Not on file  . Active Member of Clubs or Organizations: Not on file  . Attends Archivist  Meetings: Not on file  . Marital Status: Not on file  Intimate Partner Violence:   . Fear of Current or Ex-Partner: Not on file  . Emotionally Abused: Not on file  . Physically Abused: Not on file  . Sexually Abused: Not on file     Allergies  Allergen Reactions  . Lyrica [Pregabalin] Anxiety     Outpatient Medications  Prior to Visit  Medication Sig Dispense Refill  . amLODipine (NORVASC) 10 MG tablet TAKE 1 TABLET (10 MG TOTAL) BY MOUTH DAILY. 30 tablet 6  . atorvastatin (LIPITOR) 40 MG tablet Take 1 tablet (40 mg total) by mouth daily. 90 tablet 3  . Blood Glucose Monitoring Suppl (TRUE METRIX METER) w/Device KIT Use as directed 1 kit 0  . glucose blood (TRUE METRIX BLOOD GLUCOSE TEST) test strip Use as instructed 100 each 12  . losartan-hydrochlorothiazide (HYZAAR) 100-12.5 MG tablet Take 1 tablet by mouth daily. 90 tablet 3  . sertraline (ZOLOFT) 100 MG tablet Take 1.5 tablets (150 mg total) by mouth daily. 45 tablet 5  . TRUEPLUS LANCETS 28G MISC Use as directed 100 each 2  . albuterol (VENTOLIN HFA) 108 (90 Base) MCG/ACT inhaler Inhale 2 puffs into the lungs every 6 (six) hours as needed for wheezing or shortness of breath. 6.7 g 11  . cyclobenzaprine (FLEXERIL) 5 MG tablet Take 1 tablet (5 mg total) by mouth daily as needed for muscle spasms. 30 tablet 1  . omeprazole (PRILOSEC OTC) 20 MG tablet Take 2 tablets (40 mg total) by mouth 2 (two) times daily. (Patient not taking: Reported on 09/06/2019) 224 tablet 0  . tiotropium (SPIRIVA) 18 MCG inhalation capsule Place 1 capsule (18 mcg total) into inhaler and inhale daily. (Patient taking differently: Place 18 mcg into inhaler and inhale daily as needed (SOB/Wheezing). ) 30 capsule 6  . topiramate (TOPAMAX) 25 MG tablet Start 63m nightly x 1 week. Then increase to 561mnightly x 1 week. Then increase to 7567mightly x 1 week. Then increase to 100m50mghtly x week. (Patient not taking: Reported on 02/07/2020)     No  facility-administered medications prior to visit.      Review of Systems  Constitutional: Negative for diaphoresis, fatigue and fever.  HENT: Negative for congestion and sore throat.   Eyes: Negative.   Respiratory: Negative for cough and shortness of breath.   Cardiovascular: Negative for chest pain, palpitations and leg swelling.  Gastrointestinal: Negative for abdominal distention, abdominal pain, anal bleeding, blood in stool, constipation, diarrhea, nausea, rectal pain and vomiting.  Genitourinary: Negative.   Musculoskeletal: Negative for myalgias.  Skin: Negative.   Neurological: Negative for dizziness, light-headedness and headaches.  Psychiatric/Behavioral: Negative for decreased concentration, dysphoric mood and suicidal ideas.       Objective:   Physical Exam Vitals:   02/07/20 0922  BP: (!) 139/93  Pulse: 94  Temp: 98.2 F (36.8 C)  SpO2: 98%  Weight: (!) 319 lb 6.4 oz (144.9 kg)  Height: 5' 10.1" (1.781 m)    Gen: Patient is in no acute distress ENT: No lesions,  mouth clear,  oropharynx clear, no postnasal drip  Neck: No JVD, no TMG, no carotid bruits  Lungs: No use of accessory muscles, no dullness to percussion, clear without rales or rhonchi  Cardiovascular: RRR, heart sounds normal, no murmur or gallops, no peripheral edema  Abdomen: Abdomen is much improved with no evidence of rebound or guarding  Musculoskeletal: No deformities, no cyanosis or clubbing  Neuro: alert, non focal  Skin: Warm, no lesions or rashes    Assessment & Plan:  I personally [facility] as well as any outside material available during this office visit and agree with the  radiology impressions.   OSA (obstructive sleep apnea) Sleep apnea needing BiPAP 19/15 and now difficulty tolerating these pressures  I will refer  the patient back to sleep medicine for further evaluation and further adjustments of CPAP settings   Jaishaun was seen  today for follow-up.  Diagnoses and all orders for this visit:  OSA (obstructive sleep apnea) -     Ambulatory referral to Pulmonology

## 2020-02-07 NOTE — Progress Notes (Signed)
RE: Juan Kline MRN: [id number] DOB: [birth year] Date of Telemedicine Visit: 02/07/2020  Referring provider: Ladell Pier, MD Primary care provider: Ladell Pier, MD  Chief Complaint: Allergic Reaction (days after stomach issues, sick in body like a virus, nausea, not comfortable)   Telemedicine Follow Up Visit via Telephone: I connected with Juan Kline for a follow up on 02/11/20 by telephone and verified that I am speaking with the correct person using two identifiers.   I discussed the limitations, risks, security and privacy concerns of performing an evaluation and management service by telephone and the availability of in person appointments. I also discussed with the patient that there may be a patient responsible charge related to this service. The patient expressed understanding and agreed to proceed.  Patient is at home.  Provider is at the office.  Visit start time: 2:00 PM Visit end time: 2:34 PM Insurance consent/check in by: [facility] Medical consent and medical assistant/nurse: Janett Billow  History of Present Illness:  He is a 49 y.o. male presenting for an evaluation of a possible allergic reaction to the [place] vaccination. He reports that when he got it in April and then a few days later he started having bad diarrhea for a couple of weeks. On top of that, he just felt "sickly". He did not have hives. He denies any pruritis. He did have an endoscopy during this time and had acute gastritis and "something to with inflammation". He was given a "liquid to take" as well as omeprazole. He issuing this as needed. He did not have throat swelling at all with the vaccination. But he does report that it was harder to breathe and he had a lump in his throat. He never had a reaction to this in the past. He had a flu shot last week and did fine. He is open to getting the second vaccination but he wanted to talk to [name] first.   He has never had Miralax, but he did have Suprep. He did  have to cancel the colonoscopy most recently because he "felt so bad". The gut issues have resolved but he is still feeling "sickly". He saw a couple of neurologists and one was thinking migraines. He is having some problems with sensitivity to sound and light. He was prescribed Topamax but he has not started yet. He went to the PCP today as well. He has never been tested for environmental allergens, but he is wondering if this is contributing to his symptoms.     He has never had fevers at all. He denies any rashes or joint pain. He has never been diagnosed with any autoimmune disease.   He ended up losing his job because of all of this. He was working at [employer] and was just unable to keep up due to the chronic fatigue.   Otherwise, there have been no changes to his past medical history, surgical history, family history, or social history other than that listed in the chart.   Assessment and Plan:  Juan Kline is a 49 y.o. male with:  Fatigue  Vaccines and biological substances causing adverse effect  Migraine  I do not think that any of his symptoms are related to the [place] vaccination. They are certainly not IgE mediated given the time course, and testing would not be indicated because of this. I recommended getting the vaccination without hesitation and he was very open to this. Regarding his migraines, we are going to get environmental allergy testing via the blood.  We are also going to get some limited labs to evaluated his chronic fatigue.    Diagnostics: None.  Medication List:  Current Outpatient Medications  Medication Sig Dispense Refill  . amLODipine (NORVASC) 10 MG tablet TAKE 1 TABLET (10 MG TOTAL) BY MOUTH DAILY. 30 tablet 6  . atorvastatin (LIPITOR) 40 MG tablet Take 1 tablet (40 mg total) by mouth daily. 90 tablet 3  . Blood Glucose Monitoring Suppl (TRUE METRIX METER) w/Device KIT Use as directed 1 kit 0  . glucose blood (TRUE METRIX BLOOD GLUCOSE TEST) test strip  Use as instructed 100 each 12  . losartan-hydrochlorothiazide (HYZAAR) 100-12.5 MG tablet Take 1 tablet by mouth daily. 90 tablet 3  . sertraline (ZOLOFT) 100 MG tablet Take 1.5 tablets (150 mg total) by mouth daily. 45 tablet 5  . TRUEPLUS LANCETS 28G MISC Use as directed 100 each 2   No current facility-administered medications for this visit.   Allergies: Allergies  Allergen Reactions  . Lyrica [Pregabalin] Anxiety   I reviewed his past medical history, social history, family history, and environmental history and no significant changes have been reported from previous visits.  Review of Systems  Constitutional: Positive for fatigue. Negative for chills, diaphoresis and fever.  HENT: Positive for sinus pressure. Negative for congestion, ear discharge, ear pain, facial swelling, postnasal drip, rhinorrhea, sinus pain and sore throat.   Eyes: Negative for pain, discharge and itching.  Respiratory: Negative for apnea, cough, chest tightness and shortness of breath.   Cardiovascular: Negative for chest pain.  Gastrointestinal: Positive for abdominal pain and diarrhea. Negative for nausea.  Endocrine: Negative for cold intolerance and heat intolerance.  Musculoskeletal: Negative for arthralgias and myalgias.  Skin: Negative for rash.  Allergic/Immunologic: Negative for environmental allergies and food allergies.  Neurological: Positive for headaches.    Objective:  Physical exam not obtained as encounter was done via telephone.   Previous notes and tests were reviewed.  I discussed the assessment and treatment plan with the patient. The patient was provided an opportunity to ask questions and all were answered. The patient agreed with the plan and demonstrated an understanding of the instructions.   The patient was advised to call back or seek an in-person evaluation if the symptoms worsen or if the condition fails to improve as anticipated.  I provided 34 minutes of  non-face-to-face time during this encounter.  It was my pleasure to participate in Juan Kline care today. Please feel free to contact me with any questions or concerns.   Sincerely,  Valentina Shaggy, MD

## 2020-02-07 NOTE — Progress Notes (Signed)
OSA.HAVING ISSUES W/MACHINE

## 2020-02-07 NOTE — Assessment & Plan Note (Signed)
Sleep apnea needing BiPAP 19/15 and now difficulty tolerating these pressures  I will refer the patient back to sleep medicine for further evaluation and further adjustments of CPAP settings

## 2020-02-07 NOTE — Addendum Note (Signed)
Addended by: Asencion Noble E on: 02/07/2020 03:29 PM   Modules accepted: Orders

## 2020-02-07 NOTE — Patient Instructions (Signed)
Begin the Topamax as ordered by the neurologist  Keep your appointment with allergy I suspect you will be cleared to receive the Pfizer vaccine and you should proceed with that  Please take the omeprazole at least once a day to help with acid buildup in the stomach  Referral to the sleep medicine physician you saw previously will be made as you are on high and settings with your BiPAP machine that will require a sleep specialist to adjust, bring your machine with you when you go to that appointment

## 2020-02-13 LAB — IRON,TIBC AND FERRITIN PANEL
Ferritin: 275 ng/mL (ref 30–400)
Iron Saturation: 19 % (ref 15–55)
Iron: 54 ug/dL (ref 38–169)
Total Iron Binding Capacity: 282 ug/dL (ref 250–450)
UIBC: 228 ug/dL (ref 111–343)

## 2020-02-13 LAB — IGE+ALLERGENS ZONE 2(30)
Alternaria Alternata IgE: <0.1 kU/L
Amer Sycamore IgE Qn: <0.1 kU/L
Aspergillus Fumigatus IgE: <0.1 kU/L
Bahia Grass IgE: <0.1 kU/L
Bermuda Grass IgE: <0.1 kU/L
Cat Dander IgE: <0.1 kU/L
Cedar, Mountain IgE: <0.1 kU/L
Cladosporium Herbarum IgE: <0.1 kU/L
Cockroach, American IgE: 0.16 kU/L — AB
Common Silver Birch IgE: <0.1 kU/L
D Farinae IgE: <0.1 kU/L
D Pteronyssinus IgE: <0.1 kU/L
Dog Dander IgE: <0.1 kU/L
Elm, American IgE: <0.1 kU/L
Hickory, White IgE: <0.1 kU/L
IgE (Immunoglobulin E), Serum: 97 IU/mL (ref 6–495)
Johnson Grass IgE: <0.1 kU/L
Maple/Box Elder IgE: <0.1 kU/L
Mucor Racemosus IgE: <0.1 kU/L
Mugwort IgE Qn: <0.1 kU/L
Nettle IgE: <0.1 kU/L
Oak, White IgE: <0.1 kU/L
Penicillium Chrysogen IgE: <0.1 kU/L
Pigweed, Rough IgE: <0.1 kU/L
Plantain, English IgE: <0.1 kU/L
Ragweed, Short IgE: <0.1 kU/L
Sheep Sorrel IgE Qn: <0.1 kU/L
Stemphylium Herbarum IgE: <0.1 kU/L
Sweet gum IgE RAST Ql: <0.1 kU/L
Timothy Grass IgE: <0.1 kU/L
White Mulberry IgE: <0.1 kU/L

## 2020-02-13 LAB — THYROID PANEL WITH TSH
Free Thyroxine Index: 2 (ref 1.2–4.9)
T3 Uptake Ratio: 24 % (ref 24–39)
T4, Total: 8.5 ug/dL (ref 4.5–12.0)
TSH: 1.42 u[IU]/mL (ref 0.450–4.500)

## 2020-02-13 LAB — THYROID ANTIBODIES
Thyroglobulin Antibody: <1 IU/mL (ref 0.0–0.9)
Thyroperoxidase Ab SerPl-aCnc: 12 IU/mL (ref 0–34)

## 2020-02-13 LAB — VITAMIN B12: Vitamin B-12: 828 pg/mL (ref 232–1245)

## 2020-02-16 MED FILL — ?LOSARTAN POT-HCTZ 100-12.5: 100-12.5 | 30 days supply | Qty: 30 | Fill #11

## 2020-02-16 MED FILL — ?ATORVASTATIN 40MG TABLET: 40 | 30 days supply | Qty: 30 | Fill #11

## 2020-02-16 MED FILL — AMLODIPINE BESYLATE 10 MG T: 10 | 30 days supply | Qty: 30 | Fill #4

## 2020-02-21 ENCOUNTER — Other Ambulatory Visit: Payer: Self-pay

## 2020-02-21 ENCOUNTER — Ambulatory Visit (HOSPITAL_BASED_OUTPATIENT_CLINIC_OR_DEPARTMENT_OTHER): Payer: No Typology Code available for payment source | Attending: Internal Medicine | Admitting: Internal Medicine

## 2020-02-21 DIAGNOSIS — G4733 Obstructive sleep apnea (adult) (pediatric): Secondary | ICD-10-CM

## 2020-02-24 ENCOUNTER — Encounter: Payer: Self-pay | Admitting: Critical Care Medicine

## 2020-03-02 ENCOUNTER — Ambulatory Visit (INDEPENDENT_AMBULATORY_CARE_PROVIDER_SITE_OTHER): Payer: No Typology Code available for payment source

## 2020-03-02 ENCOUNTER — Other Ambulatory Visit: Payer: Self-pay

## 2020-03-02 DIAGNOSIS — Z23 Encounter for immunization: Secondary | ICD-10-CM

## 2020-03-13 MED FILL — SERTRALINE HCL 100 MG TAB: 100 | 30 days supply | Qty: 45 | Fill #3

## 2020-03-20 ENCOUNTER — Other Ambulatory Visit: Payer: Self-pay | Admitting: Internal Medicine

## 2020-03-20 ENCOUNTER — Ambulatory Visit (AMBULATORY_SURGERY_CENTER): Payer: Self-pay | Admitting: *Deleted

## 2020-03-20 ENCOUNTER — Other Ambulatory Visit: Payer: Self-pay

## 2020-03-20 VITALS — Ht 69.0 in | Wt 318.6 lb

## 2020-03-20 DIAGNOSIS — Z1211 Encounter for screening for malignant neoplasm of colon: Secondary | ICD-10-CM

## 2020-03-20 MED ORDER — SUPREP BOWEL PREP KIT 17.5-3.13-1.6 GM/177ML PO SOLN
1.0000 | Freq: Once | ORAL | 0 refills | Status: AC
Start: 2020-03-20 — End: 2020-03-20

## 2020-03-20 MED ORDER — SUPREP BOWEL PREP KIT 17.5-3.13-1.6 GM/177ML PO SOLN: 1.0000 | Freq: Once | ORAL | 0 refills | Status: DC

## 2020-03-20 MED FILL — ?OMEPRAZOLE 20 MG CPDR: 20 | 26 days supply | Qty: 104 | Fill #1

## 2020-03-20 MED FILL — AMLODIPINE BESYLATE 10 MG T: 10 | 30 days supply | Qty: 30 | Fill #5

## 2020-03-20 MED FILL — ?ATORVASTATIN 40MG TABLET: 40 | 30 days supply | Qty: 30 | Fill #0

## 2020-03-20 NOTE — Progress Notes (Signed)
Patient denies any allergies to egg or soy products. Patient denies complications with anesthesia/sedation.  Patient denies oxygen use at home and denies diet medications. Emmi instructions fsent via mychart.  Patient had both covid vaccinations, last one on 03/02/20.

## 2020-03-21 ENCOUNTER — Encounter: Payer: Self-pay | Admitting: Pulmonary Disease

## 2020-03-21 ENCOUNTER — Other Ambulatory Visit: Payer: Self-pay

## 2020-03-21 ENCOUNTER — Ambulatory Visit (INDEPENDENT_AMBULATORY_CARE_PROVIDER_SITE_OTHER): Payer: No Typology Code available for payment source | Admitting: Pulmonary Disease

## 2020-03-21 VITALS — BP 140/84 | HR 84 | Temp 97.0°F | Ht 69.0 in | Wt 314.0 lb

## 2020-03-21 DIAGNOSIS — G4733 Obstructive sleep apnea (adult) (pediatric): Secondary | ICD-10-CM

## 2020-03-21 NOTE — Patient Instructions (Signed)
Schedule patient with the sleep lab to have pressure adjusted  Currently on 19/15  We will reduce pressure to 15/11 and follow-up with a download from the machine  I will see you in about 4 to 6 weeks  Continue to work on weight loss efforts  Call with any significant concerns if the pressure change is not tolerated

## 2020-03-21 NOTE — Progress Notes (Addendum)
Juan Kline    [id number]    1970-12-10  Primary Care Physician:Johnson, Dalbert Batman, MD  Referring Physician: Elsie Stain, MD [address],  [place] [address]  Chief complaint:   Patient with very severe obstructive sleep apnea Unable to tolerate CPAP treatment  HPI:  Diagnosed in 2019 with an AHI in the 80s Was able to tolerate BiPAP for a while and lately noticed the pressure is not tolerated  Pressures currently set at 19/15 Has not used his BiPAP in the last about 3 months  Usually goes to bed between 11 and 1 AM Falls asleep almost immediately Wakes up several times during the night Awakening time is very variable Weight fluctuates about 20 pounds No family history of obstructive sleep apnea  Does have dryness of his mouth in the mornings No headaches Memory is poor  He did tolerate the BiPAP well initially when he was set up even at 19/15 but gradually got  more uncomfortable  Outpatient Encounter Medications as of 03/21/2020  Medication Sig  . amLODipine (NORVASC) 10 MG tablet TAKE 1 TABLET (10 MG TOTAL) BY MOUTH DAILY.  Marland Kitchen atorvastatin (LIPITOR) 40 MG tablet TAKE 1 TABLET (40 MG TOTAL) BY MOUTH DAILY.  Marland Kitchen Blood Glucose Monitoring Suppl (TRUE METRIX METER) w/Device KIT Use as directed  . glucose blood (TRUE METRIX BLOOD GLUCOSE TEST) test strip Use as instructed  . losartan-hydrochlorothiazide (HYZAAR) 100-12.5 MG tablet Take 1 tablet by mouth daily.  Marland Kitchen omeprazole (PRILOSEC) 20 MG capsule Take 20 mg by mouth daily.  . sertraline (ZOLOFT) 100 MG tablet Take 1.5 tablets (150 mg total) by mouth daily.  . TRUEPLUS LANCETS 28G MISC Use as directed   No facility-administered encounter medications on file as of 03/21/2020.    Allergies as of 03/21/2020 - Review Complete 03/21/2020  Allergen Reaction Noted  . Lyrica [pregabalin] Anxiety 08/12/2016    Past Medical History:  Diagnosis Date  . Chronic foot ulcer ([place]) admitted 03/02/2014    medial border first metatarsal left foot  - resolved per patient 03/20/20  . Diabetes ([place])    type 2 - no meds, diet controlled  . Diabetic foot infection ([place])   . GAD (generalized anxiety disorder)   . GERD (gastroesophageal reflux disease)   . Hx of migraines   . Hyperlipidemia   . Hypertension Dx 2009  . Mild depression ([place])   . OSA (obstructive sleep apnea)    CPAP  . Osteomyelitis left first metatarsal 03/02/2014  . Sleep apnea    not using CPAP- machine being looked at for repair 03/21/20   . Venous stasis ulcer of left lower extremity ([place])    [name] 03/02/2014 - resolved per patient 03/20/20    Past Surgical History:  Procedure Laterality Date  . BIOPSY  08/18/2019   Procedure: BIOPSY;  Surgeon: Thornton Park, MD;  Location: [facility];  Service: Gastroenterology;;  . ESOPHAGOGASTRODUODENOSCOPY (EGD) WITH PROPOFOL N/A 08/18/2019   Procedure: ESOPHAGOGASTRODUODENOSCOPY (EGD) WITH PROPOFOL;  Surgeon: Thornton Park, MD;  Location: [facility];  Service: Gastroenterology;  Laterality: N/A;  . NO PAST SURGERIES      Family History  Problem Relation Age of Onset  . Hypertension Mother   . Hyperlipidemia Mother   . Diabetes Mother   . Kidney disease Mother   . Hypertension Father   . Hyperlipidemia Father   . Diabetes Brother   . Hypertension Brother   . Hyperlipidemia Brother   . Heart  disease Maternal Grandmother   . COPD Maternal Grandmother   . Diabetes Maternal Grandfather   . Heart attack Paternal Grandmother   . Heart disease Paternal Grandfather   . Colon cancer Neg Hx   . Colon polyps Neg Hx   . Stomach cancer Neg Hx   . Esophageal cancer Neg Hx   . Ulcerative colitis Neg Hx     Social History   Socioeconomic History  . Marital status: Single    Spouse name: Not on file  . Number of children: 0  . Years of education: Not on file  . Highest education level: Not on file  Occupational History  . Not on file  Tobacco Use  . Smoking status:  Former Smoker    Packs/day: 1.50    Years: 26.00    Pack years: 39.00    Types: E-cigarettes, Cigarettes    Start date: 1989    Quit date: 02/16/2014    Years since quitting: 6.0  . Smokeless tobacco: Never Used  . Tobacco comment: occasional vapes - no nicotine  Vaping Use  . Vaping Use: Every day  . Start date: 04/15/2015  . Last attempt to quit: 04/11/2019  . Substances: Nicotine  Substance and Sexual Activity  . Alcohol use: Not Currently    Comment: 03/02/2014 "I rarely drink"  . Drug use: No  . Sexual activity: Not Currently  Other Topics Concern  . Not on file  Social History Narrative   Pt lives in 1 story home with his mother and step father   9th grade education   Last occupation was with [employer] in 2009      Unemployed at this time- thinking of filing for disability      Right handed      Social Determinants of Health   Financial Resource Strain:   . Difficulty of Paying Living Expenses: Not on file  Food Insecurity:   . Worried About Charity fundraiser in the Last Year: Not on file  . Ran Out of Food in the Last Year: Not on file  Transportation Needs:   . Lack of Transportation (Medical): Not on file  . Lack of Transportation (Non-Medical): Not on file  Physical Activity:   . Days of Exercise per Week: Not on file  . Minutes of Exercise per Session: Not on file  Stress:   . Feeling of Stress : Not on file  Social Connections:   . Frequency of Communication with Friends and Family: Not on file  . Frequency of Social Gatherings with Friends and Family: Not on file  . Attends Religious Services: Not on file  . Active Member of Clubs or Organizations: Not on file  . Attends Archivist Meetings: Not on file  . Marital Status: Not on file  Intimate Partner Violence:   . Fear of Current or Ex-Partner: Not on file  . Emotionally Abused: Not on file  . Physically Abused: Not on file  . Sexually Abused: Not on file    Review of Systems   Constitutional: Positive for fatigue.  Respiratory: Positive for apnea and shortness of breath.   Psychiatric/Behavioral: Positive for sleep disturbance.  All other systems reviewed and are negative.   Vitals:   03/21/20 0932  Pulse: 84  Temp: (!) 97 F (36.1 C)  SpO2: 98%     Physical Exam Constitutional:      Appearance: He is obese.  HENT:     Mouth/Throat:     Mouth:  Mucous membranes are moist.     Comments: Mallampati 3, crowded oropharynx Eyes:     General:        Right eye: No discharge.        Left eye: No discharge.  Cardiovascular:     Rate and Rhythm: Normal rate and regular rhythm.     Heart sounds: No murmur heard.  No friction rub.  Pulmonary:     Effort: No respiratory distress.     Breath sounds: No stridor. No wheezing or rhonchi.  Musculoskeletal:     Cervical back: No rigidity or tenderness.  Neurological:     Mental Status: He is alert.  Psychiatric:        Mood and Affect: Mood normal.     Results of the Epworth flowsheet 03/21/2020  Sitting and reading 1  Watching TV 1  Sitting, inactive in a public place (e.g. a theatre or a meeting) 1  As a passenger in a car for an hour without a break 0  Lying down to rest in the afternoon when circumstances permit 3  Sitting and talking to someone 0  Sitting quietly after a lunch without alcohol 0  In a car, while stopped for a few minutes in traffic 0  Total score 6    Data Reviewed: Sleep study reviewed showing severe obstructive sleep apnea  Titration study reviewed by myself showing he was titrated to 19/15 Did require high pressures to control number of events  Assessment:  Obstructive sleep apnea -Not being treated at present secondary to difficulty tolerating BiPAP pressures -Has not had significant weight change -No other changes in his health generally  -He definitely feels the pressure is too much and is not tolerated well -He was having some mask issues initially that have not  been addressed  Morbid obesity -Continues to work on weight loss efforts  Pathophysiology of sleep disordered breathing reviewed with the patient Treatment options reviewed  With intolerance to BiPAP pressures Need to make changes to BiPAP pressures and get him adjusted to using BiPAP on a regular basis and follow-up on compliance Plan/Recommendations:  We will have the patient seen at the sleep lab since he does not have the DME company to have his pressures adjusted to 15/11  We will follow-up with compliance data in 2 to 4 weeks  Encouraged to give [name] a call if change in pressure is not tolerated  Unfortunately if empiric pressure changes does not resolve tolerance, you may need retitration  Risks of not aggressively treating sleep disordered breathing discussed  Encouraged to continue to work on weight loss measures  Sherrilyn Rist MD  Pulmonary and Critical Care 03/21/2020, 10:05 AM  CC: Elsie Stain, MD

## 2020-03-22 ENCOUNTER — Other Ambulatory Visit: Payer: Self-pay | Admitting: Internal Medicine

## 2020-03-22 ENCOUNTER — Telehealth (INDEPENDENT_AMBULATORY_CARE_PROVIDER_SITE_OTHER): Payer: Self-pay | Admitting: Psychiatry

## 2020-03-22 ENCOUNTER — Other Ambulatory Visit: Payer: Self-pay | Admitting: Cardiology

## 2020-03-22 DIAGNOSIS — F32A Depression, unspecified: Secondary | ICD-10-CM

## 2020-03-22 DIAGNOSIS — F411 Generalized anxiety disorder: Secondary | ICD-10-CM

## 2020-03-22 DIAGNOSIS — F320 Major depressive disorder, single episode, mild: Secondary | ICD-10-CM

## 2020-03-22 MED ORDER — SERTRALINE HCL 100 MG PO TABS: 150.0000 mg | ORAL_TABLET | Freq: Every day | ORAL | 11 refills | Status: DC

## 2020-03-22 MED FILL — ?LOSARTAN POT-HCTZ 100-12.5: 100-12.5 | 30 days supply | Qty: 30 | Fill #0

## 2020-03-22 NOTE — Progress Notes (Signed)
[facility] MD/PA/NP OP Progress Note  03/22/2020 10:37 AM Juan Kline  MRN:  [id number] Interview was conducted by phone and I verified that I was speaking with the correct person using two identifiers. I discussed the limitations of evaluation and management by telemedicine and  the availability of in person appointments. Patient expressed understanding and agreed to proceed. Participants in the visit: patient (location - home); physician (location - home office).  Chief Complaint: "I'm OK".  HPI: 49yo single male with hx of anxiety, some situational depression and occasional spells/episodes of shaking, tunnel vision, SOB, dizziness, lightheadedness, headaches. Patient has been doing fairly well on sertraline. He is frustrated that the workup done in the past byDr. Acquino(brain CT and MRI, EEG, spinal tap chemistries) wasnormal and he still has no answer as to what is causing him to have these "spells". He has been also seen by Dr. [facility]undra Dubin at [facility] and started on topiramate for headaches - he feels that it has "not helped much".referred to [facility] for second opinion. He has OSA and is supposed to be on biPAP but has not been compliant. He denies feeling depressed or anxious at this time. His sleep is satisfactory and he has not needed to take trazodone in months. There are no hallucinations or delusions present.Juan Kline hasa hx of alcohol use disorderbutquit on his own41years ago.     Visit Diagnosis:    ICD-10-CM   1. GAD (generalized anxiety disorder)  F41.1   2. Mild depression (H[facility])  F32.0 sertraline (ZOLOFT) 100 MG tablet    Past Psychiatric History: Please see intake H&P.  Past Medical History:  Past Medical History:  Diagnosis Date  . Chronic foot ulcer ([place]) admitted 03/02/2014   medial border first metatarsal left foot  - resolved per patient 03/20/20  . Diabetes ([place])    type 2 - no meds, diet controlled  . Diabetic foot infection ([place])   . GAD (generalized  anxiety disorder)   . GERD (gastroesophageal reflux disease)   . Hx of migraines   . Hyperlipidemia   . Hypertension Dx 2009  . Mild depression ([place])   . OSA (obstructive sleep apnea)    CPAP  . Osteomyelitis left first metatarsal 03/02/2014  . Sleep apnea    not using CPAP- machine being looked at for repair 03/21/20   . Venous stasis ulcer of left lower extremity ([place])    [name] 03/02/2014 - resolved per patient 03/20/20    Past Surgical History:  Procedure Laterality Date  . BIOPSY  08/18/2019   Procedure: BIOPSY;  Surgeon: Thornton Park, MD;  [facility];  Service: Gastroenterology;;  . ESOPHAGOGASTRODUODENOSCOPY (EGD) WITH PROPOFOL N/A 08/18/2019   Procedure: ESOPHAGOGASTRODUODENOSCOPY (EGD) WITH PROPOFOL;  Surgeon: Thornton Park, MD;  [facility];  Service: Gastroenterology;  Laterality: N/A;  . NO PAST SURGERIES      Family Psychiatric History: None.  Family History:  Family History  Problem Relation Age of Onset  . Hypertension Mother   . Hyperlipidemia Mother   . Diabetes Mother   . Kidney disease Mother   . Hypertension Father   . Hyperlipidemia Father   . Diabetes Brother   . Hypertension Brother   . Hyperlipidemia Brother   . Heart disease Maternal Grandmother   . COPD Maternal Grandmother   . Diabetes Maternal Grandfather   . Heart attack Paternal Grandmother   . Heart disease Paternal Grandfather   . Colon cancer Neg Hx   . Colon polyps Neg Hx   .  Stomach cancer Neg Hx   . Esophageal cancer Neg Hx   . Ulcerative colitis Neg Hx     Social History:  Social History   Socioeconomic History  . Marital status: Single    Spouse name: Not on file  . Number of children: 0  . Years of education: Not on file  . Highest education level: Not on file  Occupational History  . Not on file  Tobacco Use  . Smoking status: Former Smoker    Packs/day: 1.50    Years: 26.00    Pack years: 39.00    Types: E-cigarettes, Cigarettes     Start date: 1989    Quit date: 02/16/2014    Years since quitting: 6.0  . Smokeless tobacco: Never Used  . Tobacco comment: occasional vapes - no nicotine  Vaping Use  . Vaping Use: Every day  . Start date: 04/15/2015  . Last attempt to quit: 04/11/2019  . Substances: Nicotine  Substance and Sexual Activity  . Alcohol use: Not Currently    Comment: 03/02/2014 "I rarely drink"  . Drug use: No  . Sexual activity: Not Currently  Other Topics Concern  . Not on file  Social History Narrative   Pt lives in 1 story home with his mother and step father   9th grade education   Last occupation was with [employer] in 2009      Unemployed at this time- thinking of filing for disability      Right handed      Social Determinants of Health   Financial Resource Strain: Not on file  Food Insecurity: Not on file  Transportation Needs: Not on file  Physical Activity: Not on file  Stress: Not on file  Social Connections: Not on file    Allergies:  Allergies  Allergen Reactions  . Lyrica [Pregabalin] Anxiety    Metabolic Disorder Labs: Lab Results  Component Value Date   HGBA1C 6.5 01/25/2020   MPG 148.46 08/17/2019   MPG 134 (H) 03/03/2014   No results found for: PROLACTIN Lab Results  Component Value Date   CHOL 200 (H) 03/04/2019   TRIG 255 (H) 03/04/2019   HDL 46 03/04/2019   CHOLHDL 4.3 03/04/2019   VLDL 65 (H) 02/28/2016   LDLCALC 110 (H) 03/04/2019   LDLCALC 163 (H) 12/10/2016   Lab Results  Component Value Date   TSH 1.420 02/08/2020   TSH 4.170 08/13/2017    Therapeutic Level Labs: No results found for: LITHIUM No results found for: VALPROATE No components found for:  CBMZ  Current Medications: Current Outpatient Medications  Medication Sig Dispense Refill  . amLODipine (NORVASC) 10 MG tablet TAKE 1 TABLET (10 MG TOTAL) BY MOUTH DAILY. 30 tablet 6  . atorvastatin (LIPITOR) 40 MG tablet TAKE 1 TABLET (40 MG TOTAL) BY MOUTH DAILY. 30 tablet 2  . Blood  Glucose Monitoring Suppl (TRUE METRIX METER) w/Device KIT Use as directed 1 kit 0  . glucose blood (TRUE METRIX BLOOD GLUCOSE TEST) test strip Use as instructed 100 each 12  . losartan-hydrochlorothiazide (HYZAAR) 100-12.5 MG tablet Take 1 tablet by mouth daily. 90 tablet 3  . omeprazole (PRILOSEC) 20 MG capsule Take 20 mg by mouth daily.    . sertraline (ZOLOFT) 100 MG tablet Take 1.5 tablets (150 mg total) by mouth daily. 45 tablet 11  . TRUEPLUS LANCETS 28G MISC Use as directed 100 each 2   No current facility-administered medications for this visit.       Psychiatric  Specialty Exam: Review of Systems  Neurological: Positive for dizziness and headaches.  All other systems reviewed and are negative.   There were no vitals taken for this visit.There is no height or weight on file to calculate BMI.  General Appearance: NA  Eye Contact:  NA  Speech:  Clear and Coherent and Normal Rate  Volume:  Normal  Mood:  Euthymic  Affect:  NA  Thought Process:  Goal Directed  Orientation:  Full (Time, Place, and Person)  Thought Content: Logical   Suicidal Thoughts:  No  Homicidal Thoughts:  No  Memory:  Immediate;   Fair Recent;   Fair Remote;   Fair  Judgement:  Fair  Insight:  Fair  Psychomotor Activity:  NA  Concentration:  Concentration: Good  Recall:  [place] of Knowledge: Fair  Language: Good  Akathisia:  Negative  Handed:  Right  AIMS (if indicated): not done  Assets:  Communication Skills Desire for Improvement Housing  ADL's:  Intact  Cognition: WNL  Sleep:  Good   Screenings: GAD-7   Flowsheet Row Office Visit from 01/24/2020 in [place] Office Visit from 09/26/2019 in [place] Office Visit from 08/24/2019 in [place] Office Visit from 08/17/2019 in [place] Office Visit from 08/10/2019 in [place]  Total  GAD-7 Score 2 2 0 0 0    PHQ2-9   [place] Office Visit from 02/07/2020 in [place] Office Visit from 01/24/2020 in [place] Office Visit from 09/26/2019 in [place] Visit from 09/06/2019 in [facility] [place] Office Visit from 08/24/2019 in [place]  PHQ-2 Total Score 0 2 0 0 0  PHQ-9 Total Score -- 6 4 -- 1       Assessment and Plan: 49yo single male with hx of anxiety, some situational depression and occasional spells/episodes of shaking, tunnel vision, SOB, dizziness, lightheadedness, headaches. Patient has been doing fairly well on sertraline. He is frustrated that the workup done in the past byDr. Acquino(brain CT and MRI, EEG, spinal tap chemistries) wasnormal and he still has no answer as to what is causing him to have these "spells". He has been also seen by Dr. Aundra Dubin at [facility] and started on topiramate for headaches - he feels that it has "not helped much".referred to [facility] for second opinion. He has OSA and is supposed to be on biPAP but has not been compliant. He denies feeling depressed or anxious at this time. His sleep is satisfactory and he has not needed to take trazodone in months. There are no hallucinations or delusions present.Juan Kline hasa hx of alcohol use disorderbutquit on his own41years ago.   Dx: Generalized anxiety disorder  Plan:Continuesertraline 150 mg daily.Next appointment in35month.The plan was discussed with patient who had an opportunity to ask questions and these were all answered. I spend175mutes inphone consultationwiththe patient.    OlStephanie AcreMD 03/22/2020, 10:37 AM

## 2020-03-23 ENCOUNTER — Telehealth: Payer: Self-pay | Admitting: Internal Medicine

## 2020-03-23 ENCOUNTER — Telehealth (HOSPITAL_COMMUNITY): Payer: Self-pay | Admitting: Psychiatry

## 2020-03-23 NOTE — Telephone Encounter (Signed)
Pt is requesting to get a referral from Dr. [name] for the dentist. Please advise and thank you

## 2020-03-23 NOTE — Telephone Encounter (Signed)
Why is pt needing a referral to the dentist

## 2020-03-23 NOTE — Telephone Encounter (Signed)
I return Pt call, he was inform that he need to request a referral from his PCP to be send to [facility] dentist

## 2020-03-23 NOTE — Telephone Encounter (Signed)
Patient came in to inquire about dental resources. I provided patient with dental resources and told him I thought that the [facility] would function to get him into the dentist. Patient is requesting a call back from [facility] to see how [facility] functions to help with dental coverage.

## 2020-04-02 ENCOUNTER — Other Ambulatory Visit: Payer: Self-pay

## 2020-04-02 ENCOUNTER — Encounter: Payer: Self-pay | Admitting: Gastroenterology

## 2020-04-02 ENCOUNTER — Ambulatory Visit (AMBULATORY_SURGERY_CENTER): Payer: Self-pay | Admitting: Gastroenterology

## 2020-04-02 VITALS — BP 133/92 | HR 77 | Temp 96.8°F | Resp 14 | Ht 69.0 in | Wt 328.0 lb

## 2020-04-02 DIAGNOSIS — K5730 Diverticulosis of large intestine without perforation or abscess without bleeding: Secondary | ICD-10-CM

## 2020-04-02 DIAGNOSIS — R195 Other fecal abnormalities: Secondary | ICD-10-CM

## 2020-04-02 DIAGNOSIS — Z1211 Encounter for screening for malignant neoplasm of colon: Secondary | ICD-10-CM

## 2020-04-02 DIAGNOSIS — D122 Benign neoplasm of ascending colon: Secondary | ICD-10-CM

## 2020-04-02 MED ORDER — SODIUM CHLORIDE 0.9 % IV SOLN: 500.0000 mL | Freq: Once | INTRAVENOUS | Status: DC

## 2020-04-02 NOTE — Op Note (Signed)
[place] Patient Name: Juan Kline Procedure Date: 04/02/2020 1:14 PM MRN: [id number] Endoscopist: Thornton Park MD, MD Age: 49 Referring MD:  Date of Birth: Jan 06, 1971 Gender: Male Account #: [id number] Procedure:                Colonoscopy Indications:              Heme positive stool Medicines:                Monitored Anesthesia Care Procedure:                Pre-Anesthesia Assessment:                           - Prior to the procedure, a History and Physical                            was performed, and patient medications and                            allergies were reviewed. The patient's tolerance of                            previous anesthesia was also reviewed. The risks                            and benefits of the procedure and the sedation                            options and risks were discussed with the patient.                            All questions were answered, and informed consent                            was obtained. Prior Anticoagulants: The patient has                            taken no previous anticoagulant or antiplatelet                            agents. ASA Grade Assessment: III - A patient with                            severe systemic disease. After reviewing the risks                            and benefits, the patient was deemed in                            satisfactory condition to undergo the procedure.                           After obtaining informed consent, the colonoscope  was passed under direct vision. Throughout the                            procedure, the patient's blood pressure, pulse, and                            oxygen saturations were monitored continuously. The                            Olympus CF-HQ190L ([id number]) Colonoscope was                            introduced through the anus and advanced to the 3                            cm into the ileum. The colonoscopy was  performed                            without difficulty. The patient tolerated the                            procedure well. The quality of the bowel                            preparation was good. The terminal ileum, ileocecal                            valve, appendiceal orifice, and rectum were                            photographed. Scope In: 1:23:45 PM Scope Out: 1:40:24 PM Scope Withdrawal Time: 0 hours 14 minutes 43 seconds  Total Procedure Duration: 0 hours 16 minutes 39 seconds  Findings:                 The perianal and digital rectal examinations were                            normal.                           Multiple small and large-mouthed diverticula were                            found in the sigmoid colon and descending colon.                           A 3-4 mm polyp was found in the proximal ascending                            colon. The polyp was sessile. The polyp was removed                            with a piecemeal technique using a cold snare.  Resection and retrieval were complete. Estimated                            blood loss was minimal.                           The exam was otherwise without abnormality on                            direct and retroflexion views. Complications:            No immediate complications. Estimated blood loss:                            Minimal. Estimated Blood Loss:     Estimated blood loss was minimal. Impression:               - Diverticulosis in the sigmoid colon and in the                            descending colon.                           - One 3-4 mm polyp in the proximal ascending colon,                            removed piecemeal using a cold snare. Resected and                            retrieved.                           - The examination was otherwise normal on direct                            and retroflexion views. Recommendation:           - Patient has a contact number  available for                            emergencies. The signs and symptoms of potential                            delayed complications were discussed with the                            patient. Return to normal activities tomorrow.                            Written discharge instructions were provided to the                            patient.                           - Resume previous diet.                           -  Continue present medications.                           - Await pathology results.                           - Repeat colonoscopy date to be determined after                            pending pathology results are reviewed for                            surveillance.                           - Emerging evidence supports eating a diet of                            fruits, vegetables, grains, calcium, and yogurt                            while reducing red meat and alcohol may reduce the                            risk of colon cancer.                           - Thank you for allowing me to be involved in your                            colon cancer prevention. Thornton Park MD, MD 04/02/2020 1:50:16 PM This report has been signed electronically.

## 2020-04-02 NOTE — Patient Instructions (Signed)
Information on polyps given to you today.  Await pathology results.  Resume previous diet and medications.  YOU HAD AN ENDOSCOPIC PROCEDURE TODAY AT THE [facility]:   Refer to the procedure report that was given to you for any specific questions about what was found during the examination.  If the procedure report does not answer your questions, please call your gastroenterologist to clarify.  If you requested that your care partner not be given the details of your procedure findings, then the procedure report has been included in a sealed envelope for you to review at your convenience later.  YOU SHOULD EXPECT: Some feelings of bloating in the abdomen. Passage of more gas than usual.  Walking can help get rid of the air that was put into your GI tract during the procedure and reduce the bloating. If you had a lower endoscopy (such as a colonoscopy or flexible sigmoidoscopy) you may notice spotting of blood in your stool or on the toilet paper. If you underwent a bowel prep for your procedure, you may not have a normal bowel movement for a few days.  Please Note:  You might notice some irritation and congestion in your nose or some drainage.  This is from the oxygen used during your procedure.  There is no need for concern and it should clear up in a day or so.  SYMPTOMS TO REPORT IMMEDIATELY:   Following lower endoscopy (colonoscopy or flexible sigmoidoscopy):  Excessive amounts of blood in the stool  Significant tenderness or worsening of abdominal pains  Swelling of the abdomen that is new, acute  Fever of 100F or higher   For urgent or emergent issues, a gastroenterologist can be reached at any hour by calling [phone]. Do not use MyChart messaging for urgent concerns.    DIET:  We do recommend a small meal at first, but then you may proceed to your regular diet.  Drink plenty of fluids but you should avoid alcoholic beverages for 24 hours.  ACTIVITY:  You should  plan to take it easy for the rest of today and you should NOT DRIVE or use heavy machinery until tomorrow (because of the sedation medicines used during the test).    FOLLOW UP: Our staff will call the number listed on your records 48-72 hours following your procedure to check on you and address any questions or concerns that you may have regarding the information given to you following your procedure. If we do not reach you, we will leave a message.  We will attempt to reach you two times.  During this call, we will ask if you have developed any symptoms of COVID 19. If you develop any symptoms (ie: fever, flu-like symptoms, shortness of breath, cough etc.) before then, please call [phone].  If you test positive for Covid 19 in the 2 weeks post procedure, please call and report this information to us.    If any biopsies were taken you will be contacted by phone or by letter within the next 1-3 weeks.  Please call us at [phone] if you have not heard about the biopsies in 3 weeks.    SIGNATURES/CONFIDENTIALITY: You and/or your care partner have signed paperwork which will be entered into your electronic medical record.  These signatures attest to the fact that that the information above on your After Visit Summary has been reviewed and is understood.  Full responsibility of the confidentiality of this discharge information lies with you and/or your care-partner. 

## 2020-04-02 NOTE — Progress Notes (Signed)
Report to PACU, RN, vss, BBS= Clear.  

## 2020-04-02 NOTE — Progress Notes (Signed)
Vitals-Angela Goddard  Pt's states no medical or surgical changes since previsit or office visit.  Called to room to assist during endoscopic procedure.  Patient ID and intended procedure confirmed with present staff. Received instructions for my participation in the procedure from the performing physician.

## 2020-04-05 ENCOUNTER — Telehealth: Payer: Self-pay

## 2020-04-05 NOTE — Telephone Encounter (Signed)
  Follow up Call-  Call back number 04/02/2020  Post procedure Call Back phone  # [phone]  Permission to leave phone message Yes  Some recent data might be hidden     Patient questions:  Do you have a fever, pain , or abdominal swelling? No. Pain Score  0 *  Have you tolerated food without any problems? Yes.    Have you been able to return to your normal activities? Yes.    Do you have any questions about your discharge instructions: Diet   No. Medications  No. Follow up visit  No.  Do you have questions or concerns about your Care? No.  Actions: * If pain score is 4 or above: No action needed, pain <4.  1. Have you developed a fever since your procedure? no  2.   Have you had an respiratory symptoms (SOB or cough) since your procedure? no  3.   Have you tested positive for COVID 19 since your procedure no  4.   Have you had any family members/close contacts diagnosed with the COVID 19 since your procedure?  no   If yes to any of these questions please route to Joylene John, RN and Joella Prince, RN

## 2020-04-12 ENCOUNTER — Encounter: Payer: Self-pay | Admitting: Gastroenterology

## 2020-04-17 ENCOUNTER — Other Ambulatory Visit: Payer: Self-pay | Admitting: Cardiology

## 2020-04-17 MED FILL — ?LOSARTAN POTASSIUM-HCTZ 10: 100-25 | 15 days supply | Qty: 15 | Fill #0

## 2020-04-17 MED FILL — AMLODIPINE BESYLATE 10 MG T: 10 | 30 days supply | Qty: 30 | Fill #6

## 2020-04-17 MED FILL — ?ATORVASTATIN 40MG TABLET: 40 | 30 days supply | Qty: 30 | Fill #1

## 2020-04-17 MED FILL — SERTRALINE HCL 100 MG TAB: 100 | 30 days supply | Qty: 45 | Fill #4

## 2020-04-27 ENCOUNTER — Other Ambulatory Visit: Payer: Self-pay | Admitting: Internal Medicine

## 2020-04-27 ENCOUNTER — Ambulatory Visit: Payer: Self-pay | Attending: Internal Medicine | Admitting: Internal Medicine

## 2020-04-27 ENCOUNTER — Other Ambulatory Visit: Payer: Self-pay

## 2020-04-27 DIAGNOSIS — G4733 Obstructive sleep apnea (adult) (pediatric): Secondary | ICD-10-CM

## 2020-04-27 DIAGNOSIS — E1142 Type 2 diabetes mellitus with diabetic polyneuropathy: Secondary | ICD-10-CM

## 2020-04-27 DIAGNOSIS — F419 Anxiety disorder, unspecified: Secondary | ICD-10-CM

## 2020-04-27 DIAGNOSIS — I10 Essential (primary) hypertension: Secondary | ICD-10-CM

## 2020-04-27 DIAGNOSIS — K0889 Other specified disorders of teeth and supporting structures: Secondary | ICD-10-CM

## 2020-04-27 DIAGNOSIS — G43009 Migraine without aura, not intractable, without status migrainosus: Secondary | ICD-10-CM

## 2020-04-27 MED ORDER — ATORVASTATIN CALCIUM 40 MG PO TABS: 40.0000 mg | ORAL_TABLET | Freq: Every day | ORAL | 2 refills | Status: DC

## 2020-04-27 MED ORDER — AMLODIPINE BESYLATE 10 MG PO TABS: 10.0000 mg | ORAL_TABLET | Freq: Every day | ORAL | 6 refills | Status: DC

## 2020-04-27 MED ORDER — LOSARTAN POTASSIUM-HCTZ 100-12.5 MG PO TABS: 1.0000 | ORAL_TABLET | Freq: Every day | ORAL | 6 refills | Status: DC

## 2020-04-27 MED FILL — ?LOSARTAN POT-HCTZ 100-12.5: 100-12.5 | 30 days supply | Qty: 30 | Fill #0

## 2020-04-27 NOTE — Progress Notes (Signed)
Virtual Visit via Telephone Note  I connected with Juan Kline on 04/27/20 at 12:17 p.m by telephone and verified that I am speaking with the correct person using two identifiers.  Location: Patient: home Provider: office  The patient, my CMA Ms. Juan Kline and myself participated in this encounter. I discussed the limitations, risks, security and privacy concerns of performing an evaluation and management service by telephone and the availability of in person appointments. I also discussed with the patient that there may be a patient responsible charge related to this service. The patient expressed understanding and agreed to proceed.   History of Present Illness: HTN, HL,DM type 2with neuropathy,anxiety, chronic fatigue, severe OSAon BiPAP, vapes, mix COPD, obesity, gastritis (EGD 08/2019).Last seen 01/2020.   OSA:  Saw Dr. Joya Gaskins since last visit because he was having problems tolerating BiPAP pressures.  Referred to sleep lab to try get his pressure adjusted.  Patient states that they tried him on their machine and he did well.  However when they put him on his own machine and adjusted the pressure it worked well for about 3 minutes and then the pressure started automatically rising again.  The tech at this time told him he was not sure what else to do.  Subsequently he saw Dr. Ander Slade who referred him back to the sleep lab to have his pressures adjusted to 15/11.  He stated that if empiric pressure changes does not resolve tolerance he may need repeat titration.  Patient states he has not been called as yet and has not been able to use the BiPAP.  Migraine headaches: Started the Topamax and felt worse with it so he d/c taking.  Wonders whether he is a candidate for Botox injections.  Anxiety/Depression: pt states Dr. Montel Culver is retiring in a few mths.  Was told by his office that they will not be taking the OC any more.  Had a "spell" last wk   DM:  Has not check BS recently.  Reports  compliance with eating habits.  He has been observed off metformin since May of last year.  HTN: Reports compliance with medications and low-salt diet.  He needs refill on Cozaar/HCTZ.  Had some teeth brake off.  Facial pain few wks ago.  Requests referral to dentist. Outpatient Encounter Medications as of 04/27/2020  Medication Sig  . losartan-hydrochlorothiazide (HYZAAR) 100-12.5 MG tablet TAKE 1 TABLET BY MOUTH DAILY. NEED OV.  Marland Kitchen amLODipine (NORVASC) 10 MG tablet TAKE 1 TABLET (10 MG TOTAL) BY MOUTH DAILY.  Marland Kitchen atorvastatin (LIPITOR) 40 MG tablet TAKE 1 TABLET (40 MG TOTAL) BY MOUTH DAILY.  Marland Kitchen Blood Glucose Monitoring Suppl (TRUE METRIX METER) w/Device KIT Use as directed  . glucose blood (TRUE METRIX BLOOD GLUCOSE TEST) test strip Use as instructed  . omeprazole (PRILOSEC) 20 MG capsule Take 20 mg by mouth daily.  . sertraline (ZOLOFT) 100 MG tablet Take 1.5 tablets (150 mg total) by mouth daily.  . TRUEPLUS LANCETS 28G MISC Use as directed   No facility-administered encounter medications on file as of 04/27/2020.      Observations/Objective: No direct observation done as this was a telephone encounter.  Assessment and Plan: 1. Essential hypertension - losartan-hydrochlorothiazide (HYZAAR) 100-12.5 MG tablet; Take 1 tablet by mouth daily.  Dispense: 30 tablet; Refill: 6 - amLODipine (NORVASC) 10 MG tablet; Take 1 tablet (10 mg total) by mouth daily.  Dispense: 30 tablet; Refill: 6  2. Type 2 diabetes mellitus with diabetic polyneuropathy, without long-term current use of insulin (  [place]) Patient will come to the lab to have his A1c checked.  Continue healthy eating habits - atorvastatin (LIPITOR) 40 MG tablet; Take 1 tablet (40 mg total) by mouth daily.  Dispense: 30 tablet; Refill: 2 - Hemoglobin A1c; Future - Lipid panel; Future - Microalbumin / creatinine urine ratio; Future  3. OSA (obstructive sleep apnea) We will try to get him back to the sleep center for them to set his BiPAP  to 15/11 as recommended by sleep med physician.  I will also touch base with Dr. Joya Gaskins about any further options for this patient. - Desensitization mask fit; Future  4. Pain, dental - Ambulatory referral to Dentistry  5. Migraine without aura and without status migrainosus, not intractable We will refer him back to neurology at [facility] - Ambulatory referral to Neurology  6. Anxiety - Ambulatory referral to Psychiatry   Follow Up Instructions: 4 mths   I discussed the assessment and treatment plan with the patient. The patient was provided an opportunity to ask questions and all were answered. The patient agreed with the plan and demonstrated an understanding of the instructions.   The patient was advised to call back or seek an in-person evaluation if the symptoms worsen or if the condition fails to improve as anticipated.  I provided 16 minutes of non-face-to-face time during this encounter.   Karle Plumber, MD

## 2020-05-08 ENCOUNTER — Other Ambulatory Visit: Payer: Self-pay

## 2020-05-08 ENCOUNTER — Encounter: Payer: Self-pay | Admitting: Internal Medicine

## 2020-05-08 ENCOUNTER — Ambulatory Visit (INDEPENDENT_AMBULATORY_CARE_PROVIDER_SITE_OTHER): Payer: No Typology Code available for payment source | Admitting: Internal Medicine

## 2020-05-08 ENCOUNTER — Ambulatory Visit: Payer: Self-pay | Attending: Internal Medicine

## 2020-05-08 VITALS — BP 114/60 | HR 77 | Ht 69.0 in | Wt 319.0 lb

## 2020-05-08 DIAGNOSIS — E1142 Type 2 diabetes mellitus with diabetic polyneuropathy: Secondary | ICD-10-CM

## 2020-05-08 DIAGNOSIS — R0602 Shortness of breath: Secondary | ICD-10-CM

## 2020-05-08 DIAGNOSIS — I10 Essential (primary) hypertension: Secondary | ICD-10-CM

## 2020-05-08 DIAGNOSIS — E785 Hyperlipidemia, unspecified: Secondary | ICD-10-CM

## 2020-05-08 MED FILL — ?LOSARTAN POT-HCTZ 100-12.5: 100-12.5 | 30 days supply | Qty: 30 | Fill #0

## 2020-05-08 NOTE — Patient Instructions (Signed)
Medication Instructions:  °Continue current medications ° °*If you need a refill on your cardiac medications before your next appointment, please call your pharmacy* ° ° °Lab Work: °None Ordered ° ° °Testing/Procedures: °None Ordered ° ° °Follow-Up: °At [facility], you and your health needs are our priority.  As part of our continuing mission to provide you with exceptional heart care, we have created designated Provider Care Teams.  These Care Teams include your primary Cardiologist (physician) and Advanced Practice Providers (APPs -  Physician Assistants and Nurse Practitioners) who all work together to provide you with the care you need, when you need it. ° °We recommend signing up for the patient portal called "MyChart".  Sign up information is provided on this After Visit Summary.  MyChart is used to connect with patients for Virtual Visits (Telemedicine).  Patients are able to view lab/test results, encounter notes, upcoming appointments, etc.  Non-urgent messages can be sent to your provider as well.   °To learn more about what you can do with MyChart, go to [email or web address].   ° °Your next appointment:   °1 year(s) ° °The format for your next appointment:   °In Person ° °Provider:   °You may see Kenneth C Hilty, MD or one of the following Advanced Practice Providers on your designated Care Team:   °· Hao Meng, PA-C °· Angela Duke, PA-C or  °· Krista Kroeger, PA-C ° ° ° °

## 2020-05-08 NOTE — Progress Notes (Addendum)
OFFICE NOTE  Chief Complaint:  Follow-up  Primary Care Physician: Ladell Pier, MD  HPI:  Juan Kline is a 50 y.o. male with a past medial history significant for hypertension, venous stasis ulcers and osteomyelitis of the first left metatarsal, who recently was in the emergency department with chest pain and uncontrolled hypertension. He said he knew he had hypertension for more than 10 years. He's not been on any medications. He said he had a sleep study number of years ago but was not diagnosed with OSA, but did not follow-up with the study. He was seen 2 other times in the emergency department the summer for chest pain and hypertension as well. Recently saw Kerin Ransom, PA-C, who ordered an exercise tolerance test which was negative. The patient exercised for 6 minutes on the Bruce protocol to max predicted heart rate of 86%. Normal blood pressure response and negative for ischemia. He also had an echocardiogram which demonstrated an EF of 55-60%, normal wall motion and grade 1 diastolic dysfunction with mild left atrial enlargement. He reports she's had no further chest pain symptoms since then.  03/14/2019  Juan Kline is seen today in follow-up.  Overall he feels well.  His blood pressure is now much better controlled at 126/87.  He is in fact had several visits demonstrating good blood pressure control.  He was seen recently by Roby Lofts, PA-C, who noted that he had some shortness of breath.  An echo was ordered which showed mild diastolic dysfunction and normal systolic function.  He is scheduled for some PFTs coming up next week.  I suspect weight is a big factor in his shortness of breath.  05/08/2020  Juan Kline is seen today in follow-up.  When I last saw him in 2020 he underwent PFTs which showed some moderate obstruction and diffusion defect.  He is also vaping it was felt this was related to that.  He tried a couple inhalers without much benefit.  He was also diagnosed with  severe obstructive sleep apnea.  Unfortunately he has been unable to tolerate BiPAP.  Has been working with pulmonary on that.  He saw his PCP for virtual visit this past week.  He has repeat labs which are going to be drawn today to reassess cholesterol and other factors.  He denies any exertional chest pain.  Unfortunately he has not been able to lose much weight.  He says he does not have a lot of energy to exercise and feels that it is all related to his sleep apnea.  PMHx:  Past Medical History:  Diagnosis Date  . Chronic foot ulcer ([place]) admitted 03/02/2014   medial border first metatarsal left foot  - resolved per patient 03/20/20  . Diabetes ([place])    type 2 - no meds, diet controlled  . Diabetic foot infection ([place])   . GAD (generalized anxiety disorder)   . GERD (gastroesophageal reflux disease)   . Hx of migraines   . Hyperlipidemia   . Hypertension Dx 2009  . Mild depression ([place])   . OSA (obstructive sleep apnea)    CPAP  . Osteomyelitis left first metatarsal 03/02/2014  . Sleep apnea    not using CPAP- machine being looked at for repair 03/21/20   . Venous stasis ulcer of left lower extremity ([place])    Juan Kline 03/02/2014 - resolved per patient 03/20/20    Past Surgical History:  Procedure Laterality Date  . BIOPSY  08/18/2019   Procedure: BIOPSY;  Surgeon:  Thornton Park, MD;  Location: [facility];  Service: Gastroenterology;;  . ESOPHAGOGASTRODUODENOSCOPY (EGD) WITH PROPOFOL N/A 08/18/2019   Procedure: ESOPHAGOGASTRODUODENOSCOPY (EGD) WITH PROPOFOL;  Surgeon: Thornton Park, MD;  Location: [facility];  Service: Gastroenterology;  Laterality: N/A;  . NO PAST SURGERIES      FAMHx:  Family History  Problem Relation Age of Onset  . Hypertension Mother   . Hyperlipidemia Mother   . Diabetes Mother   . Kidney disease Mother   . Hypertension Father   . Hyperlipidemia Father   . Diabetes Brother   . Hypertension Brother   . Hyperlipidemia Brother   . Heart disease  Maternal Grandmother   . COPD Maternal Grandmother   . Diabetes Maternal Grandfather   . Heart attack Paternal Grandmother   . Heart disease Paternal Grandfather   . Colon cancer Neg Hx   . Colon polyps Neg Hx   . Stomach cancer Neg Hx   . Esophageal cancer Neg Hx   . Ulcerative colitis Neg Hx     SOCHx:   reports that he quit smoking about 6 years ago. His smoking use included e-cigarettes and cigarettes. He started smoking about 33 years ago. He has a 39.00 pack-year smoking history. He has never used smokeless tobacco. He reports previous alcohol use. He reports that he does not use drugs.  ALLERGIES:  Allergies  Allergen Reactions  . Lyrica [Pregabalin] Anxiety    ROS: Pertinent items noted in HPI and remainder of comprehensive ROS otherwise negative.  HOME MEDS: Current Outpatient Medications on File Prior to Visit  Medication Sig Dispense Refill  . amLODipine (NORVASC) 10 MG tablet Take 1 tablet (10 mg total) by mouth daily. 30 tablet 6  . atorvastatin (LIPITOR) 40 MG tablet Take 1 tablet (40 mg total) by mouth daily. 30 tablet 2  . losartan-hydrochlorothiazide (HYZAAR) 100-12.5 MG tablet Take 1 tablet by mouth daily. 30 tablet 6  . omeprazole (PRILOSEC) 20 MG capsule Take 20 mg by mouth as needed.    . sertraline (ZOLOFT) 100 MG tablet Take 1.5 tablets (150 mg total) by mouth daily. 45 tablet 11   No current facility-administered medications on file prior to visit.    LABS/IMAGING: No results found for this or any previous visit (from the past 48 hour(s)). No results found.  LIPID PANEL:    Component Value Date/Time   CHOL 200 (H) 03/04/2019 1107   TRIG 255 (H) 03/04/2019 1107   HDL 46 03/04/2019 1107   CHOLHDL 4.3 03/04/2019 1107   CHOLHDL 5.6 (H) 02/28/2016 1244   VLDL 65 (H) 02/28/2016 1244   LDLCALC 110 (H) 03/04/2019 1107     WEIGHTS: Wt Readings from Last 3 Encounters:  05/08/20 (!) 319 lb (144.7 kg)  04/02/20 (!) 328 lb (148.8 kg)  03/21/20 (!)  314 lb (142.4 kg)    VITALS: BP 114/60   Pulse 77   Ht 5\' 9"  (1.753 m)   Wt (!) 319 lb (144.7 kg)   BMI 47.11 kg/m   EXAM: General appearance: alert and no distress Neck: no carotid bruit, no JVD and thyroid not enlarged, symmetric, no tenderness/mass/nodules Lungs: clear to auscultation bilaterally Heart: regular rate and rhythm Abdomen: morbidly obese Extremities: venous stasis dermatitis noted and varicose veins Pulses: 2+ and symmetric Skin: LE venous stasis changes Neurologic: Grossly normal Psych: Pleasant  EKG: Sinus rhythm with first-degree AV block at 77, pulmonary disease pattern-personally reviewed  ASSESSMENT: 1. Dyspnea with exertion and at rest -moderate obstructive airways disease (2020) by  PFT 2. Recurrent chest pain-negative exercise treadmill stress test and normal LVEF on echo (12/2016) 3. Hypertension 4. Year OSA-unable to tolerate BiPAP 5. Morbid obesity 6. Vaping  PLAN: 1.   Mr. Reamy still reports dyspnea on exertion.  He does have more symptoms of right heart dysfunction and probably pulmonary hypertension.  There is severe sleep apnea but has been intolerant of BiPAP.  He denies any anginal sounding chest pain.  Blood pressure is now well controlled.  His PCP is working to get him set up with BiPAP that is effective.  He needs to continue to cut back portions and work on weight loss.  I encouraged him to stop vaping.  Plan follow-up with me annually or sooner as necessary.  Pixie Casino, MD, [facility], [place] Director of the Advanced Lipid Disorders &  Cardiovascular Risk Reduction Clinic Diplomate of the American Board of Clinical Lipidology Attending Cardiologist  Direct Dial: [phone]  Fax: [phone]  Website:  [email or web address] Febres 05/08/2020, 10:00 AM

## 2020-05-09 ENCOUNTER — Ambulatory Visit: Payer: Self-pay | Attending: Internal Medicine | Admitting: Licensed Clinical Social Worker

## 2020-05-09 DIAGNOSIS — F411 Generalized anxiety disorder: Secondary | ICD-10-CM

## 2020-05-09 LAB — LIPID PANEL
Chol/HDL Ratio: 4.4 ratio (ref 0.0–5.0)
Cholesterol, Total: 202 mg/dL — ABNORMAL HIGH (ref 100–199)
HDL: 46 mg/dL (ref >39)
LDL Chol Calc (NIH): 106 mg/dL — ABNORMAL HIGH (ref 0–99)
Triglycerides: 294 mg/dL — ABNORMAL HIGH (ref 0–149)
VLDL Cholesterol Cal: 50 mg/dL — ABNORMAL HIGH (ref 5–40)

## 2020-05-09 LAB — MICROALBUMIN / CREATININE URINE RATIO
Creatinine, Urine: 255.2 mg/dL
Microalb/Creat Ratio: 112 mg/g creat — ABNORMAL HIGH (ref 0–29)
Microalbumin, Urine: 284.6 ug/mL

## 2020-05-09 LAB — HEMOGLOBIN A1C
Est. average glucose Bld gHb Est-mCnc: 140 mg/dL
Hgb A1c MFr Bld: 6.5 % — ABNORMAL HIGH (ref 4.8–5.6)

## 2020-05-15 ENCOUNTER — Other Ambulatory Visit: Payer: Self-pay

## 2020-05-15 ENCOUNTER — Other Ambulatory Visit: Payer: Self-pay | Admitting: Critical Care Medicine

## 2020-05-15 ENCOUNTER — Ambulatory Visit: Payer: Self-pay | Attending: Critical Care Medicine | Admitting: Critical Care Medicine

## 2020-05-15 ENCOUNTER — Encounter: Payer: Self-pay | Admitting: Critical Care Medicine

## 2020-05-15 VITALS — BP 125/87 | HR 64 | Resp 16 | Wt 319.0 lb

## 2020-05-15 DIAGNOSIS — L309 Dermatitis, unspecified: Secondary | ICD-10-CM | POA: Insufficient documentation

## 2020-05-15 DIAGNOSIS — G4733 Obstructive sleep apnea (adult) (pediatric): Secondary | ICD-10-CM

## 2020-05-15 MED ORDER — CLOTRIMAZOLE-BETAMETHASONE 1-0.05 % EX CREA: 1.0000 "application " | TOPICAL_CREAM | Freq: Two times a day (BID) | CUTANEOUS | 0 refills | Status: DC

## 2020-05-15 MED FILL — CLOTRIMAZOLE-BETAMETHASONE: 1-0.05 | 20 days supply | Qty: 45 | Fill #0

## 2020-05-15 NOTE — Progress Notes (Signed)
Sore on inside of ankle Noticed over several months  Does not itch

## 2020-05-15 NOTE — Progress Notes (Signed)
Subjective:    Patient ID: Juan Kline, male    DOB: 04-Sep-1970, 50 y.o.   MRN: [id number] History of Present Illness: 50 y.o.M  HTN COPD OSA T2DM 08/04/19 This is a telemedicine video visit The patient gives a history of having a Pfizer vaccine his first dose on April 2 and he states within 2 days he began having diarrhea fatigue headaches loss of taste and smell nausea muscle aches and severe fatigue The patient has another dose of Covid vaccine due on April 23  The patient has been staying out of work at this time.  He works at [employer] and is exposed to various customers.  He has been keeping up with his fluids.  He denies chest pain.  He states his dyspnea is stable at this time.  He denies a cough.  His diarrhea though is quite excessive.   08/10/2019 The patient returns today for a face-to-face visit to evaluate the patient's abdominal pain and ongoing diarrhea  The patient's continues to have loose stools without blood in the stools.  There is nausea without vomiting.  He has abdominal pain in the epigastric area.  His Covid test did come back negative.  I saw him on a video visit yesterday but need to bring him to the office for a physical exam.  Previous history of morbid obesity and hypertension along with sleep apnea and COPD mixed type with associated history of diabetes peripheral neuropathy and hyperlipidemia  08/17/2019 Patient returns today with progressive epigastric pain nausea without vomiting episodic tarry stools but decrease in diarrhea.  The patient continues to have a sense of fogginess in his thinking but no overt headaches.  He did have chest pain when his blood pressure was in the 1 7110 range the pain is improved now pain is in the anterior sternal area with radiation down the right arm.  He remains very weak and is taking in some nourishment but overall is not really improving.  He has been to the emergency room 3 times over the past 5 days.  Troponin  studies and ECGs were unremarkable but there is concern over coronary disease with his diabetes.  I held his Metformin because of his nausea and his glucoses have been in the 160 range off Metformin.  Note CT scan of the abdomen was unremarkable and lipase has been elevated but it dropped back down again with most recent ED visit.  The patient states with the Protonix that he was given from the emergency room his tarry stools are less but still has significant epigastric pain  02/07/2020 This patient was seen in return follow-up and referred by primary care for evaluation of difficulties with the patient's CPAP machine the patient is actually on BiPAP with a setting of 19/15 and is followed by sleep medicine  He states the pressure appears to be increasing and is too much.  He is not as fatigued on the BiPAP.  He does have increased migraine headache symptoms  05/15/2020 Primary care patient of Dr. [name] seen today as a work in for lesions on the feet.  He has a lesion on the medial aspect of his right foot and posterior aspect on the Achilles tendon area this is been going on for 2 weeks  An additional side problem he brings up is that his sleep apnea machine is still not been reset.  We sent him to a sleep medicine physician who referred him to the sleep lab and they were  unable to do anything with the machine  He still cannot use the BiPAP machine as the pressure levels are set too high   Past Medical History:  Diagnosis Date  . Chronic foot ulcer ([place]) admitted 03/02/2014   medial border first metatarsal left foot  - resolved per patient 03/20/20  . Diabetes ([place])    type 2 - no meds, diet controlled  . Diabetic foot infection ([place])   . GAD (generalized anxiety disorder)   . GERD (gastroesophageal reflux disease)   . Hx of migraines   . Hyperlipidemia   . Hypertension Dx 2009  . Mild depression ([place])   . OSA (obstructive sleep apnea)    CPAP  . Osteomyelitis left first metatarsal  03/02/2014  . Sleep apnea    not using CPAP- machine being looked at for repair 03/21/20   . Venous stasis ulcer of left lower extremity ([place])    [name] 03/02/2014 - resolved per patient 03/20/20     Family History  Problem Relation Age of Onset  . Hypertension Mother   . Hyperlipidemia Mother   . Diabetes Mother   . Kidney disease Mother   . Hypertension Father   . Hyperlipidemia Father   . Diabetes Brother   . Hypertension Brother   . Hyperlipidemia Brother   . Heart disease Maternal Grandmother   . COPD Maternal Grandmother   . Diabetes Maternal Grandfather   . Heart attack Paternal Grandmother   . Heart disease Paternal Grandfather   . Colon cancer Neg Hx   . Colon polyps Neg Hx   . Stomach cancer Neg Hx   . Esophageal cancer Neg Hx   . Ulcerative colitis Neg Hx      Social History   Socioeconomic History  . Marital status: Single    Spouse name: Not on file  . Number of children: 0  . Years of education: Not on file  . Highest education level: Not on file  Occupational History  . Not on file  Tobacco Use  . Smoking status: Former Smoker    Packs/day: 1.50    Years: 26.00    Pack years: 39.00    Types: E-cigarettes, Cigarettes    Start date: 1989    Quit date: 02/16/2014    Years since quitting: 6.2  . Smokeless tobacco: Never Used  . Tobacco comment: occasional vapes - no nicotine  Vaping Use  . Vaping Use: Every day  . Start date: 04/15/2015  . Last attempt to quit: 04/11/2019  . Substances: Nicotine  Substance and Sexual Activity  . Alcohol use: Not Currently    Comment: 03/02/2014 "I rarely drink"  . Drug use: No  . Sexual activity: Not Currently  Other Topics Concern  . Not on file  Social History Narrative   Pt lives in 1 story home with his mother and step father   9th grade education   Last occupation was with [employer] in 2009      Unemployed at this time- thinking of filing for disability      Right handed      Social Determinants of  Health   Financial Resource Strain: Not on file  Food Insecurity: Not on file  Transportation Needs: Not on file  Physical Activity: Not on file  Stress: Not on file  Social Connections: Not on file  Intimate Partner Violence: Not on file     Allergies  Allergen Reactions  . Lyrica [Pregabalin] Anxiety     Outpatient Medications Prior  to Visit  Medication Sig Dispense Refill  . amLODipine (NORVASC) 10 MG tablet Take 1 tablet (10 mg total) by mouth daily. 30 tablet 6  . atorvastatin (LIPITOR) 40 MG tablet Take 1 tablet (40 mg total) by mouth daily. 30 tablet 2  . losartan-hydrochlorothiazide (HYZAAR) 100-12.5 MG tablet Take 1 tablet by mouth daily. 30 tablet 6  . omeprazole (PRILOSEC) 20 MG capsule Take 20 mg by mouth as needed.    . sertraline (ZOLOFT) 100 MG tablet Take 1.5 tablets (150 mg total) by mouth daily. 45 tablet 11   No facility-administered medications prior to visit.      Review of Systems  Constitutional: Negative for diaphoresis, fatigue and fever.  HENT: Negative for congestion and sore throat.   Eyes: Negative.   Respiratory: Negative for cough and shortness of breath.   Cardiovascular: Negative for chest pain, palpitations and leg swelling.  Gastrointestinal: Negative for abdominal distention, abdominal pain, anal bleeding, blood in stool, constipation, diarrhea, nausea, rectal pain and vomiting.  Genitourinary: Negative.   Musculoskeletal: Negative for myalgias.  Skin: Negative.   Neurological: Negative for dizziness, light-headedness and headaches.  Psychiatric/Behavioral: Negative for decreased concentration, dysphoric mood and suicidal ideas.       Objective:   Physical Exam Vitals:   05/15/20 1529  BP: 125/87  Pulse: 64  Resp: 16  SpO2: 94%  Weight: (!) 319 lb (144.7 kg)    Gen: Patient is in no acute distress ENT: No lesions,  mouth clear,  oropharynx clear, no postnasal drip  Neck: No JVD, no TMG, no carotid bruits  Lungs: No use of  accessory muscles, no dullness to percussion, clear without rales or rhonchi  Cardiovascular: RRR, heart sounds normal, no murmur or gallops, no peripheral edema  Abdomen: Abdomen is much improved with no evidence of rebound or guarding  Musculoskeletal: No deformities, no cyanosis or clubbing  Neuro: alert, non focal  Skin: Warm, no lesions or rashes    Assessment & Plan:  I personally reviewed all images and lab data in the [facility] system as well as any outside material available during this office visit and agree with the  radiology impressions.   Dermatitis Dermatitis of the right foot medial aspect over the ankle and posterior Achilles  This appears to be inflammatory and fungal in nature  We will apply Lotrisone cream topically to the areas twice daily  If unimproved the patient will need to see dermatology  OSA (obstructive sleep apnea) I will set up an appointment for this patient to come see me in the clinic bring his BiPAP machine and I will see if I can make the adjustments myself   Diagnoses and all orders for this visit:  Dermatitis  OSA (obstructive sleep apnea)  Other orders -     clotrimazole-betamethasone (LOTRISONE) cream; Apply 1 application topically 2 (two) times daily.

## 2020-05-15 NOTE — BH Specialist Note (Signed)
Integrated Behavioral Health via Telemedicine Visit  05/09/20 Riaz Onorato [id number]  Number of [place] visits: 1 Session Start time: 11:00 AM  Session End time: 11:20 AM Total time: 20  Referring Provider: Dr. [name] Patient/Family location: Home [facility] Provider location: Office All persons participating in visit: LCSW and Patient Types of Service: Individual psychotherapy  I connected with Seymour Bars by Telephone  (Video is Caregility application) and verified that I am speaking with the correct person using two identifiers.Discussed confidentiality: Yes   I discussed the limitations of telemedicine and the availability of in person appointments.  Discussed there is a possibility of technology failure and discussed alternative modes of communication if that failure occurs.  I discussed that engaging in this telemedicine visit, they consent to the provision of behavioral healthcare and the services will be billed under their insurance.  Patient and/or legal guardian expressed understanding and consented to Telemedicine visit: Yes   Presenting Concerns: Patient and/or family reports the following symptoms/concerns: Pt reports difficulty managing depression and anxiety symptoms, stating they are "up and down" Pt reports increase in anxiety triggered by broken BiPap machine which pt is concerned will negatively impact health. He is also interested in applying for disability Duration of problem: Ongoing; Severity of problem: moderate  Patient and/or Family's Strengths/Protective Factors: Social connections, Social and Emotional competence, Concrete supports in place (healthy food, safe environments, etc.) and Sense of purpose  Goals Addressed: Patient will: 1.  Reduce symptoms of: anxiety and depression Pt agreed to continue compliance with medication management 2.  Demonstrate ability to: Increase healthy adjustment to current life circumstances and Increase  adequate support systems for patient/family Pt is open to establishing behavioral health services through [facility], once current provider retires.   Progress towards Goals: Ongoing  Interventions: Interventions utilized:  Solution-Focused Strategies Standardized Assessments completed: Not Needed  Patient Response: Pt was engaged during session and was successful in establishing goals  Assessment: Patient currently experiencing anxiety and depression symptoms triggered by psychosocial stressors. Pt is concerned about inability to utilize BiPap machine, in addition, to financial strain.    Patient may benefit from continued medication management and utilizing healthy coping skills. Pt will follow up with Dr. Joya Gaskins regarding BiPap machine at upcoming appointment on 05/15/20. He will discuss transferring behavioral health care with current psychiatrist to ensure management of symptoms. LCSW agreed to mail out resources on how to apply for disability.   Plan: 1. Follow up with behavioral health clinician on : Contact LCSW with any additional behavioral health and/or resource needs 2. Behavioral recommendations: Utilize strategies discussed and resources provided 3. Referral(s): [place] (In Clinic) and [employer] Resources:  Finances  I discussed the assessment and treatment plan with the patient and/or parent/guardian. They were provided an opportunity to ask questions and all were answered. They agreed with the plan and demonstrated an understanding of the instructions.   They were advised to call back or seek an in-person evaluation if the symptoms worsen or if the condition fails to improve as anticipated.  Rebekah Chesterfield, LCSW  05/15/20 4:26 PM

## 2020-05-15 NOTE — Assessment & Plan Note (Signed)
Dermatitis of the right foot medial aspect over the ankle and posterior Achilles  This appears to be inflammatory and fungal in nature  We will apply Lotrisone cream topically to the areas twice daily  If unimproved the patient will need to see dermatology

## 2020-05-15 NOTE — Assessment & Plan Note (Signed)
I will set up an appointment for this patient to come see me in the clinic bring his BiPAP machine and I will see if I can make the adjustments myself

## 2020-05-15 NOTE — Patient Instructions (Signed)
Trial Lotrisone cream to the right foot affected areas twice daily  If unimproved we will need to refer you to dermatology  Please schedule appointment to bring your BiPAP machine in for me to look at to see if I can get the settings changed for you

## 2020-05-21 MED FILL — ?ATORVASTATIN 40MG TABLET: 40 | 30 days supply | Qty: 30 | Fill #2

## 2020-05-21 MED FILL — AMLODIPINE BESYLATE 10 MG T: 10 | 30 days supply | Qty: 30 | Fill #0

## 2020-05-21 MED FILL — SERTRALINE HCL 100 MG TAB: 100 | 30 days supply | Qty: 45 | Fill #5

## 2020-05-24 ENCOUNTER — Other Ambulatory Visit: Payer: Self-pay

## 2020-05-24 ENCOUNTER — Other Ambulatory Visit: Payer: Self-pay | Admitting: Pulmonary Disease

## 2020-05-24 ENCOUNTER — Telehealth: Payer: Self-pay | Admitting: Pulmonary Disease

## 2020-05-24 ENCOUNTER — Ambulatory Visit
Payer: No Typology Code available for payment source | Attending: Critical Care Medicine | Admitting: Critical Care Medicine

## 2020-05-24 DIAGNOSIS — G4733 Obstructive sleep apnea (adult) (pediatric): Secondary | ICD-10-CM

## 2020-05-24 NOTE — Telephone Encounter (Signed)
We spoke with [facility] to see which DME company would be able to assist this patient in getting his BIPAP settings changed. She advised [employer] would help the patient. DME Order has been placed. Nothing further needed at this time.

## 2020-05-24 NOTE — Progress Notes (Signed)
Subjective:    Patient ID: Juan Kline, male    DOB: 04-Sep-1970, 50 y.o.   MRN: [id number] History of Present Illness: 50 y.o.M  HTN COPD OSA T2DM 08/04/19 This is a telemedicine video visit The patient gives a history of having a Pfizer vaccine his first dose on April 2 and he states within 2 days he began having diarrhea fatigue headaches loss of taste and smell nausea muscle aches and severe fatigue The patient has another dose of Covid vaccine due on April 23  The patient has been staying out of work at this time.  He works at [employer] and is exposed to various customers.  He has been keeping up with his fluids.  He denies chest pain.  He states his dyspnea is stable at this time.  He denies a cough.  His diarrhea though is quite excessive.   08/10/2019 The patient returns today for a face-to-face visit to evaluate the patient's abdominal pain and ongoing diarrhea  The patient's continues to have loose stools without blood in the stools.  There is nausea without vomiting.  He has abdominal pain in the epigastric area.  His Covid test did come back negative.  I saw him on a video visit yesterday but need to bring him to the office for a physical exam.  Previous history of morbid obesity and hypertension along with sleep apnea and COPD mixed type with associated history of diabetes peripheral neuropathy and hyperlipidemia  08/17/2019 Patient returns today with progressive epigastric pain nausea without vomiting episodic tarry stools but decrease in diarrhea.  The patient continues to have a sense of fogginess in his thinking but no overt headaches.  He did have chest pain when his blood pressure was in the 1 7110 range the pain is improved now pain is in the anterior sternal area with radiation down the right arm.  He remains very weak and is taking in some nourishment but overall is not really improving.  He has been to the emergency room 3 times over the past 5 days.  Troponin  studies and ECGs were unremarkable but there is concern over coronary disease with his diabetes.  I held his Metformin because of his nausea and his glucoses have been in the 160 range off Metformin.  Note CT scan of the abdomen was unremarkable and lipase has been elevated but it dropped back down again with most recent ED visit.  The patient states with the Protonix that he was given from the emergency room his tarry stools are less but still has significant epigastric pain  02/07/2020 This patient was seen in return follow-up and referred by primary care for evaluation of difficulties with the patient's CPAP machine the patient is actually on BiPAP with a setting of 19/15 and is followed by sleep medicine  He states the pressure appears to be increasing and is too much.  He is not as fatigued on the BiPAP.  He does have increased migraine headache symptoms  05/15/2020 Primary care patient of Dr. [name] seen today as a work in for lesions on the feet.  He has a lesion on the medial aspect of his right foot and posterior aspect on the Achilles tendon area this is been going on for 2 weeks  An additional side problem he brings up is that his sleep apnea machine is still not been reset.  We sent him to a sleep medicine physician who referred him to the sleep lab and they were  unable to do anything with the machine  He still cannot use the BiPAP machine as the pressure levels are set too high  05/24/2020 Patient was seen today to evaluate his CPAP machine he has a dream [place] CPAP machine the settings of 19/15 full facemask this machine is an adaptive servo bilevel.device and can only have pressure changes using a computer login which I do not have the equipment here to do this change  He states the pressure is too high when I turn the machine on it immediately ramps up to 15 at a base with an intake of 19 cm water pressure    Past Medical History:  Diagnosis Date  . Chronic foot ulcer ([place])  admitted 03/02/2014   medial border first metatarsal left foot  - resolved per patient 03/20/20  . Diabetes ([place])    type 2 - no meds, diet controlled  . Diabetic foot infection ([place])   . GAD (generalized anxiety disorder)   . GERD (gastroesophageal reflux disease)   . Hx of migraines   . Hyperlipidemia   . Hypertension Dx 2009  . Mild depression ([place])   . OSA (obstructive sleep apnea)    CPAP  . Osteomyelitis left first metatarsal 03/02/2014  . Sleep apnea    not using CPAP- machine being looked at for repair 03/21/20   . Venous stasis ulcer of left lower extremity ([place])    [name] 03/02/2014 - resolved per patient 03/20/20     Family History  Problem Relation Age of Onset  . Hypertension Mother   . Hyperlipidemia Mother   . Diabetes Mother   . Kidney disease Mother   . Hypertension Father   . Hyperlipidemia Father   . Diabetes Brother   . Hypertension Brother   . Hyperlipidemia Brother   . Heart disease Maternal Grandmother   . COPD Maternal Grandmother   . Diabetes Maternal Grandfather   . Heart attack Paternal Grandmother   . Heart disease Paternal Grandfather   . Colon cancer Neg Hx   . Colon polyps Neg Hx   . Stomach cancer Neg Hx   . Esophageal cancer Neg Hx   . Ulcerative colitis Neg Hx      Social History   Socioeconomic History  . Marital status: Single    Spouse name: Not on file  . Number of children: 0  . Years of education: Not on file  . Highest education level: Not on file  Occupational History  . Not on file  Tobacco Use  . Smoking status: Former Smoker    Packs/day: 1.50    Years: 26.00    Pack years: 39.00    Types: E-cigarettes, Cigarettes    Start date: 1989    Quit date: 02/16/2014    Years since quitting: 6.2  . Smokeless tobacco: Never Used  . Tobacco comment: occasional vapes - no nicotine  Vaping Use  . Vaping Use: Every day  . Start date: 04/15/2015  . Last attempt to quit: 04/11/2019  . Substances: Nicotine  Substance and  Sexual Activity  . Alcohol use: Not Currently    Comment: 03/02/2014 "I rarely drink"  . Drug use: No  . Sexual activity: Not Currently  Other Topics Concern  . Not on file  Social History Narrative   Pt lives in 1 story home with his mother and step father   9th grade education   Last occupation was with [employer] in 2009      Unemployed at this time-  thinking of filing for disability      Right handed      Social Determinants of Health   Financial Resource Strain: Not on file  Food Insecurity: Not on file  Transportation Needs: Not on file  Physical Activity: Not on file  Stress: Not on file  Social Connections: Not on file  Intimate Partner Violence: Not on file     Allergies  Allergen Reactions  . Lyrica [Pregabalin] Anxiety     Outpatient Medications Prior to Visit  Medication Sig Dispense Refill  . amLODipine (NORVASC) 10 MG tablet Take 1 tablet (10 mg total) by mouth daily. 30 tablet 6  . atorvastatin (LIPITOR) 40 MG tablet Take 1 tablet (40 mg total) by mouth daily. 30 tablet 2  . clotrimazole-betamethasone (LOTRISONE) cream Apply 1 application topically 2 (two) times daily. 45 g 0  . losartan-hydrochlorothiazide (HYZAAR) 100-12.5 MG tablet Take 1 tablet by mouth daily. 30 tablet 6  . omeprazole (PRILOSEC) 20 MG capsule Take 20 mg by mouth as needed.    . sertraline (ZOLOFT) 100 MG tablet Take 1.5 tablets (150 mg total) by mouth daily. 45 tablet 11   No facility-administered medications prior to visit.      Review of Systems  Constitutional: Negative for diaphoresis, fatigue and fever.  HENT: Negative for congestion and sore throat.   Eyes: Negative.   Respiratory: Negative for cough and shortness of breath.   Cardiovascular: Negative for chest pain, palpitations and leg swelling.  Gastrointestinal: Negative for abdominal distention, abdominal pain, anal bleeding, blood in stool, constipation, diarrhea, nausea, rectal pain and vomiting.  Genitourinary:  Negative.   Musculoskeletal: Negative for myalgias.  Skin: Negative.   Neurological: Negative for dizziness, light-headedness and headaches.  Psychiatric/Behavioral: Negative for decreased concentration, dysphoric mood and suicidal ideas.       Objective:   Physical Exam There were no vitals filed for this visit.  No exam this is a visit to check his CPAP machine    Assessment & Plan:  I personally reviewed all images and lab data in the [facility] system as well as any outside material available during this office visit and agree with the  radiology impressions.   OSA (obstructive sleep apnea) I am going to see if we can get one of the local DME companies or maybe even the sleep lab to make adjustments to his CPAP machine since he obtained it from the American sleep apnea [employer] CPAP assistance program which is no longer in operations as it shutdown during the pandemic.  He needs to be changed to a BiPAP setting of 14/10 with a slow ramp up his mask fits and the rest of his equipment is in good order he is only had this equipment for 2 years   Eragon was seen today for cpap teaching.  Diagnoses and all orders for this visit:  OSA (obstructive sleep apnea)

## 2020-05-24 NOTE — Telephone Encounter (Signed)
We spoke with [facility] to see which DME company would be able to assist this patient in getting his BIPAP settings changed. She advised [employer] would help the patient. DME Order has been placed. Nothing further needed at this time.

## 2020-05-24 NOTE — Assessment & Plan Note (Signed)
I am going to see if we can get one of the local DME companies or maybe even the sleep lab to make adjustments to his CPAP machine since he obtained it from the American sleep apnea [employer] CPAP assistance program which is no longer in operations as it shutdown during the pandemic.  He needs to be changed to a BiPAP setting of 14/10 with a slow ramp up his mask fits and the rest of his equipment is in good order he is only had this equipment for 2 years

## 2020-05-24 NOTE — Telephone Encounter (Signed)
DME referral to [employer]  Change BiPAP settings from 19/15 to 14/10  BiPAP supplies should be ordered   I will gladly follow-up with him 3 to 4 weeks following initiation of new BiPAP settings

## 2020-05-31 ENCOUNTER — Telehealth: Payer: Self-pay

## 2020-05-31 NOTE — Telephone Encounter (Signed)
Per [facility], they are not able to change BiPAP settings.   This CM contacted [facility] about assisting with the setting change.  She reported that they received an order from [facility] for pressure change and supplies so they are working on it. The patient is uninsured

## 2020-06-04 ENCOUNTER — Telehealth: Payer: Self-pay | Admitting: Internal Medicine

## 2020-06-04 MED FILL — ?LOSARTAN POT-HCTZ 100-12.5: 100-12.5 | 30 days supply | Qty: 30 | Fill #1

## 2020-06-04 NOTE — Telephone Encounter (Signed)
Call placed to patient # [phone]  to inform him that this CM has no updates on status of BiPAP setting change. Message left with call back requested to this CM

## 2020-06-04 NOTE — Telephone Encounter (Signed)
Patient presented in clinic and wanted to know if there was any update on his CPAP machine. Patient stated he had not heard anything. Please follow up.

## 2020-06-06 ENCOUNTER — Telehealth: Payer: Self-pay | Admitting: Internal Medicine

## 2020-06-06 ENCOUNTER — Telehealth: Payer: Self-pay

## 2020-06-06 NOTE — Telephone Encounter (Signed)
Called to get the insurance ID for the patient and the effective date.  Please call to discuss at [phone]

## 2020-06-06 NOTE — Telephone Encounter (Signed)
I return Pt call, LVM to inform that this is not an insurance, for this reason you don't have an ID# is a program that cone offer to the Pt that has not insurance

## 2020-06-06 NOTE — Telephone Encounter (Signed)
Call received from patient.  Explained to him that this CM does not have any more information about changing the settings on his BiPAP. [employer] has been notified but there is no guarantee that they will be able to perform the setting change because they did not provide the machine.

## 2020-06-07 ENCOUNTER — Telehealth: Payer: Self-pay

## 2020-06-07 NOTE — Telephone Encounter (Signed)
Message received from [facility] noting that they have scheduled the patient to bring his BiPAP machine to their office for a setting change.

## 2020-06-18 MED FILL — ATORVASTATIN CALCIUM 40 MG: 40 | 30 days supply | Qty: 30 | Fill #0

## 2020-06-18 MED FILL — AMLODIPINE BESYLATE 10 MG T: 10 | 30 days supply | Qty: 30 | Fill #1

## 2020-07-03 MED FILL — SERTRALINE HCL 100 MG TAB: 100 | 30 days supply | Qty: 45 | Fill #0

## 2020-07-03 MED FILL — ?LOSARTAN POT-HCTZ 100-12.5: 100-12.5 | 30 days supply | Qty: 30 | Fill #2

## 2020-07-10 ENCOUNTER — Ambulatory Visit: Payer: Self-pay | Attending: Internal Medicine

## 2020-07-10 ENCOUNTER — Other Ambulatory Visit: Payer: Self-pay

## 2020-07-14 ENCOUNTER — Other Ambulatory Visit: Payer: Self-pay

## 2020-07-17 ENCOUNTER — Other Ambulatory Visit: Payer: Self-pay

## 2020-07-17 MED FILL — Amlodipine Besylate Tab 10 MG (Base Equivalent): ORAL | 30 days supply | Qty: 30 | Fill #0 | Status: AC

## 2020-07-18 ENCOUNTER — Other Ambulatory Visit: Payer: Self-pay

## 2020-07-18 ENCOUNTER — Telehealth: Payer: Self-pay | Admitting: Internal Medicine

## 2020-07-18 MED ORDER — TOPIRAMATE 25 MG PO TABS
25.0000 mg | ORAL_TABLET | Freq: Every day | ORAL | 1 refills | Status: DC
  Filled 2020-07-18: qty 30, 30d supply, fill #0

## 2020-07-18 MED FILL — Atorvastatin Calcium Tab 40 MG (Base Equivalent): ORAL | 30 days supply | Qty: 30 | Fill #0 | Status: AC

## 2020-07-18 NOTE — Telephone Encounter (Signed)
Called pt made aware RX sent to pharmacy

## 2020-07-18 NOTE — Telephone Encounter (Signed)
Patient was referred to neurology by PCP and specialist prescribed topamax,, patient was unable to start medication and would like to start medication now but specialist is no longer at the practice and pharmacy advise patient to contact PCP and request prescription. Please advise patient directly   [facility] [place] Phone:  [phone]  Fax:  [phone]

## 2020-07-19 ENCOUNTER — Other Ambulatory Visit: Payer: Self-pay

## 2020-07-23 ENCOUNTER — Encounter: Payer: Self-pay | Admitting: Internal Medicine

## 2020-07-29 MED FILL — Losartan Potassium & Hydrochlorothiazide Tab 100-12.5 MG: ORAL | 30 days supply | Qty: 30 | Fill #0 | Status: AC

## 2020-07-30 ENCOUNTER — Other Ambulatory Visit: Payer: Self-pay

## 2020-07-30 ENCOUNTER — Ambulatory Visit: Payer: No Typology Code available for payment source | Attending: Internal Medicine

## 2020-08-01 ENCOUNTER — Encounter: Payer: Self-pay | Admitting: Internal Medicine

## 2020-08-01 ENCOUNTER — Other Ambulatory Visit: Payer: Self-pay | Admitting: Internal Medicine

## 2020-08-01 MED ORDER — TOPIRAMATE 50 MG PO TABS
50.0000 mg | ORAL_TABLET | Freq: Every day | ORAL | 0 refills | Status: DC
Start: 2020-08-01 — End: 2020-08-27
  Filled 2020-08-01: qty 30, 30d supply, fill #0

## 2020-08-02 ENCOUNTER — Other Ambulatory Visit: Payer: Self-pay

## 2020-08-06 ENCOUNTER — Other Ambulatory Visit: Payer: Self-pay

## 2020-08-12 MED FILL — Amlodipine Besylate Tab 10 MG (Base Equivalent): ORAL | 30 days supply | Qty: 30 | Fill #1 | Status: AC

## 2020-08-12 MED FILL — Atorvastatin Calcium Tab 40 MG (Base Equivalent): ORAL | 30 days supply | Qty: 30 | Fill #1 | Status: AC

## 2020-08-13 ENCOUNTER — Other Ambulatory Visit: Payer: Self-pay

## 2020-08-13 MED FILL — Sertraline HCl Tab 100 MG: ORAL | 30 days supply | Qty: 45 | Fill #0 | Status: AC

## 2020-08-26 MED FILL — Losartan Potassium & Hydrochlorothiazide Tab 100-12.5 MG: ORAL | 30 days supply | Qty: 30 | Fill #1 | Status: AC

## 2020-08-27 ENCOUNTER — Other Ambulatory Visit: Payer: Self-pay

## 2020-08-27 ENCOUNTER — Encounter: Payer: Self-pay | Admitting: Internal Medicine

## 2020-08-27 ENCOUNTER — Ambulatory Visit: Payer: Self-pay | Attending: Internal Medicine | Admitting: Internal Medicine

## 2020-08-27 VITALS — BP 117/81 | HR 95 | Resp 16 | Wt 309.4 lb

## 2020-08-27 DIAGNOSIS — F321 Major depressive disorder, single episode, moderate: Secondary | ICD-10-CM

## 2020-08-27 DIAGNOSIS — E1169 Type 2 diabetes mellitus with other specified complication: Secondary | ICD-10-CM

## 2020-08-27 DIAGNOSIS — I152 Hypertension secondary to endocrine disorders: Secondary | ICD-10-CM

## 2020-08-27 DIAGNOSIS — E785 Hyperlipidemia, unspecified: Secondary | ICD-10-CM

## 2020-08-27 DIAGNOSIS — E1159 Type 2 diabetes mellitus with other circulatory complications: Secondary | ICD-10-CM

## 2020-08-27 DIAGNOSIS — Z1159 Encounter for screening for other viral diseases: Secondary | ICD-10-CM

## 2020-08-27 DIAGNOSIS — F411 Generalized anxiety disorder: Secondary | ICD-10-CM

## 2020-08-27 DIAGNOSIS — E1142 Type 2 diabetes mellitus with diabetic polyneuropathy: Secondary | ICD-10-CM

## 2020-08-27 DIAGNOSIS — R809 Proteinuria, unspecified: Secondary | ICD-10-CM

## 2020-08-27 DIAGNOSIS — G43009 Migraine without aura, not intractable, without status migrainosus: Secondary | ICD-10-CM

## 2020-08-27 DIAGNOSIS — G4733 Obstructive sleep apnea (adult) (pediatric): Secondary | ICD-10-CM

## 2020-08-27 DIAGNOSIS — E1129 Type 2 diabetes mellitus with other diabetic kidney complication: Secondary | ICD-10-CM

## 2020-08-27 LAB — GLUCOSE, POCT (MANUAL RESULT ENTRY): POC Glucose: 195 mg/dL — AB (ref 70–99)

## 2020-08-27 MED ORDER — TOPIRAMATE 25 MG PO TABS
ORAL_TABLET | ORAL | 5 refills | Status: DC
  Filled 2020-08-27: qty 90, 30d supply, fill #0
  Filled 2020-09-29: qty 90, 30d supply, fill #1
  Filled 2020-10-27: qty 90, 30d supply, fill #2
  Filled 2020-12-02: qty 90, 30d supply, fill #3

## 2020-08-27 NOTE — Progress Notes (Signed)
Patient ID: Juan Kline, male    DOB: 1970/12/06  MRN: [id number]  CC: Diabetes and Hypertension   Subjective: Juan Kline is a 50 y.o. male who presents for chronic ds management His concerns today include:  HTN, HL,DM type 2with neuropathy,anxiety, chronic fatigue, severe OSAon BiPAP, vapes, mix COPD, obesity, gastritis (EGD 08/2019).  OSA:  Was able to get BiPAP fixed.  Using every night now. Pressure reduced to 14/10 by Dr. Ander Slade and he is tolerating better.  DM/obesity: Results for orders placed or performed in visit on 08/27/20  POCT glucose (manual entry)  Result Value Ref Range   POC Glucose 195 (A) 70 - 99 mg/dl  Not checking BS consistently Loss 10 lbs since 04/2020 Reports doing good with eating habits.  No sugary drinks and watches portion sizes.  Eats wheat bread.No sweets.  Last A1c done in January of this year was 6.5.  Last LDL was 106.  Patient reports that he had been taking the atorvastatin consistently. Urine on last visit showed microalbuminuria that had improved from the year before.  Level was 112.   HTN: compliant with meds and salt restriction.  Medications are amlodipine and Hyzaar. Has a device but does not check BP unless he feels BP may be elev  Migraine: reports HA have improved with the Topamax.  Feels effect wears off before his once daily dose at nights.  He thinks he may benefit from a higher dose.  He inquires about doing twice a day dosing versus once a day.  Depression/Anxiety: Referred to behavioral health on last visit.  He is not sure whether he heard from them or not.  Still on Zoloft 150 mg daily.  On PHQ-9 he indicated suicidal thoughts.  However he tells me that he has no plans of hurting himself.   Feels unable to do what he wants to do due to ongoing health issues like the headaches and dizziness though somewhat better since starting Topamax.  Not able to exercise due to head and dizziness -cannot stand lights or sounds and still not  able to do anything for so long. Trying to get disability again and working with a company.  He wanted to let me know that they will probably be contacting me.  Patient Active Problem List   Diagnosis Date Noted  . Dermatitis 05/15/2020  . Hx of migraine headaches 01/24/2020  . Lipomatosis of subcutaneous tissue 12/08/2019  . Tinnitus of both ears 11/08/2019  . Abnormal MRI of the head 08/24/2019  . Acute superficial gastritis without hemorrhage 08/24/2019  . COPD mixed type ([place]) 04/04/2019  . Positive for macroalbuminuria 03/06/2019  . Vapes nicotine containing substance 03/03/2019  . Class 3 severe obesity due to excess calories without serious comorbidity with body mass index (BMI) of 45.0 to 49.9 in adult [facility]) 03/03/2019  . Hyperlipidemia associated with type 2 diabetes mellitus ([place]) 03/03/2019  . Type 2 diabetes mellitus with diabetic polyneuropathy, without long-term current use of insulin ([place]) 04/13/2018  . Mild depression ([place]) 04/01/2018  . OSA (obstructive sleep apnea) 10/26/2017  . Anxiety disorder 10/26/2017  . Peripheral neuropathy 12/12/2016  . GAD (generalized anxiety disorder) 05/11/2015  . Former smoker 05/11/2015  . Other hyperlipidemia 05/22/2014  . Vitamin D insufficiency 05/22/2014  . Chronic intractable headache 05/19/2014  . Numbness and tingling of foot 05/19/2014  . Venous stasis dermatitis of both lower extremities 05/19/2014  . history of Hypertension 03/02/2014  . Morbid obesity ([place]) 03/02/2014     Current  Outpatient Medications on File Prior to Visit  Medication Sig Dispense Refill  . amLODipine (NORVASC) 10 MG tablet TAKE 1 TABLET (10 MG TOTAL) BY MOUTH DAILY. 30 tablet 6  . atorvastatin (LIPITOR) 40 MG tablet TAKE 1 TABLET (40 MG TOTAL) BY MOUTH DAILY. 30 tablet 2  . clotrimazole-betamethasone (LOTRISONE) cream APPLY 1 APPLICATION TOPICALLY 2 (TWO) TIMES DAILY. 45 g 0  . losartan-hydrochlorothiazide (HYZAAR) 100-12.5 MG tablet TAKE 1 TABLET BY  MOUTH DAILY. 30 tablet 6  . omeprazole (PRILOSEC) 20 MG capsule Take 20 mg by mouth as needed.    . sertraline (ZOLOFT) 100 MG tablet TAKE 1.5 TABLETS (150 MG TOTAL) BY MOUTH DAILY. 45 tablet 11   No current facility-administered medications on file prior to visit.    Allergies  Allergen Reactions  . Lyrica [Pregabalin] Anxiety    Social History   Socioeconomic History  . Marital status: Single    Spouse name: Not on file  . Number of children: 0  . Years of education: Not on file  . Highest education level: Not on file  Occupational History  . Not on file  Tobacco Use  . Smoking status: Former Smoker    Packs/day: 1.50    Years: 26.00    Pack years: 39.00    Types: E-cigarettes, Cigarettes    Start date: 1989    Quit date: 02/16/2014    Years since quitting: 6.5  . Smokeless tobacco: Never Used  . Tobacco comment: occasional vapes - no nicotine  Vaping Use  . Vaping Use: Every day  . Start date: 04/15/2015  . Last attempt to quit: 04/11/2019  . Substances: Nicotine  Substance and Sexual Activity  . Alcohol use: Not Currently    Comment: 03/02/2014 "I rarely drink"  . Drug use: No  . Sexual activity: Not Currently  Other Topics Concern  . Not on file  Social History Narrative   Pt lives in 1 story home with his mother and step father   9th grade education   Last occupation was with [employer] in 2009      Unemployed at this time- thinking of filing for disability      Right handed      Social Determinants of Health   Financial Resource Strain: Not on file  Food Insecurity: Not on file  Transportation Needs: Not on file  Physical Activity: Not on file  Stress: Not on file  Social Connections: Not on file  Intimate Partner Violence: Not on file    Family History  Problem Relation Age of Onset  . Hypertension Mother   . Hyperlipidemia Mother   . Diabetes Mother   . Kidney disease Mother   . Hypertension Father   . Hyperlipidemia Father   . Diabetes  Brother   . Hypertension Brother   . Hyperlipidemia Brother   . Heart disease Maternal Grandmother   . COPD Maternal Grandmother   . Diabetes Maternal Grandfather   . Heart attack Paternal Grandmother   . Heart disease Paternal Grandfather   . Colon cancer Neg Hx   . Colon polyps Neg Hx   . Stomach cancer Neg Hx   . Esophageal cancer Neg Hx   . Ulcerative colitis Neg Hx     Past Surgical History:  Procedure Laterality Date  . BIOPSY  08/18/2019   Procedure: BIOPSY;  Surgeon: Thornton Park, MD;  Location: [facility];  Service: Gastroenterology;;  . ESOPHAGOGASTRODUODENOSCOPY (EGD) WITH PROPOFOL N/A 08/18/2019   Procedure: ESOPHAGOGASTRODUODENOSCOPY (EGD) WITH PROPOFOL;  Surgeon: Thornton Park, MD;  Location: [facility];  Service: Gastroenterology;  Laterality: N/A;  . NO PAST SURGERIES      ROS: Review of Systems Negative except as stated above  PHYSICAL EXAM: BP 117/81   Pulse 95   Resp 16   Wt (!) 309 lb 6.4 oz (140.3 kg)   SpO2 96%   BMI 45.69 kg/m   Wt Readings from Last 3 Encounters:  08/27/20 (!) 309 lb 6.4 oz (140.3 kg)  05/15/20 (!) 319 lb (144.7 kg)  05/08/20 (!) 319 lb (144.7 kg)    Physical Exam General appearance - alert, well appearing, and in no distress Mental status - normal mood, behavior, speech, dress, motor activity, and thought processes Neck - supple, no significant adenopathy Chest - clear to auscultation, no wheezes, rales or rhonchi, symmetric air entry Heart - normal rate, regular rhythm, normal S1, S2, no murmurs, rubs, clicks or gallops Extremities - peripheral pulses normal, no pedal edema, no clubbing or cyanosis.  He has varicose veins in both lower extremities. Diabetic Foot Exam - Simple   Simple Foot Form Visual Inspection See comments: Yes Sensation Testing See comments: Yes Pulse Check Posterior Tibialis and Dorsalis pulse intact bilaterally: Yes Comments He is flat-footed.  He has decreased sensation in both feet.       Depression screen [facility] 2/9 08/27/2020 05/24/2020 02/07/2020  Decreased Interest 3 2 0  Down, Depressed, Hopeless 3 1 0  PHQ - 2 Score 6 3 0  Altered sleeping 0 2 -  Tired, decreased energy 3 1 -  Change in appetite 2 0 -  Feeling bad or failure about yourself  1 0 -  Trouble concentrating 1 0 -  Moving slowly or fidgety/restless 0 0 -  Suicidal thoughts 1 0 -  PHQ-9 Score 14 6 -  Difficult doing work/chores - - -  Some recent data might be hidden   GAD 7 : Generalized Anxiety Score 08/27/2020 01/24/2020 09/26/2019 08/24/2019  Nervous, Anxious, on Edge 0 0 0 0  Control/stop worrying 2 1 2  0  Worry too much - different things 2 1 0 0  Trouble relaxing 2 0 0 0  Restless 0 0 0 0  Easily annoyed or irritable 0 0 0 0  Afraid - awful might happen 0 0 0 0  Total GAD 7 Score 6 2 2  0  Anxiety Difficulty - - - -     CMP Latest Ref Rng & Units 08/18/2019 08/17/2019 08/15/2019  Glucose 70 - 99 mg/dL 124(H) 129(H) 184(H)  BUN 6 - 20 mg/dL 16 21(H) 23(H)  Creatinine 0.61 - 1.24 mg/dL 0.84 0.87 0.86  Sodium 135 - 145 mmol/L 138 136 136  Potassium 3.5 - 5.1 mmol/L 3.5 3.5 3.6  Chloride 98 - 111 mmol/L 100 98 98  CO2 22 - 32 mmol/L 26 25 23   Calcium 8.9 - 10.3 mg/dL 8.5(L) 9.0 9.5  Total Protein 6.5 - 8.1 g/dL - 7.8 7.9  Total Bilirubin 0.3 - 1.2 mg/dL - 0.6 0.3  Alkaline Phos 38 - 126 U/L - 56 56  AST 15 - 41 U/L - 28 34  ALT 0 - 44 U/L - 36 33   Lipid Panel     Component Value Date/Time   CHOL 202 (H) 05/08/2020 1106   TRIG 294 (H) 05/08/2020 1106   HDL 46 05/08/2020 1106   CHOLHDL 4.4 05/08/2020 1106   CHOLHDL 5.6 (H) 02/28/2016 1244   VLDL 65 (H) 02/28/2016 1244   [place]  106 (H) 05/08/2020 1106    CBC    Component Value Date/Time   WBC 10.6 (H) 10/07/2019 1229   RBC 4.88 10/07/2019 1229   HGB 13.6 10/07/2019 1229   HGB 14.5 08/10/2019 1146   HCT 40.5 10/07/2019 1229   HCT 42.4 08/10/2019 1146   PLT 285.0 10/07/2019 1229   PLT 301 08/10/2019 1146   MCV 83.0 10/07/2019  1229   MCV 81 08/10/2019 1146   MCH 27.6 08/18/2019 0408   MCHC 33.5 10/07/2019 1229   RDW 14.3 10/07/2019 1229   RDW 13.4 08/10/2019 1146   LYMPHSABS 2.4 08/14/2019 0141   LYMPHSABS 2.0 08/10/2019 1146   MONOABS 0.8 08/14/2019 0141   EOSABS 0.2 08/14/2019 0141   EOSABS 0.1 08/10/2019 1146   BASOSABS 0.0 08/14/2019 0141   BASOSABS 0.0 08/10/2019 1146    ASSESSMENT AND PLAN: 1. Type 2 diabetes mellitus with diabetic polyneuropathy, without long-term current use of insulin ([place]) He has been diet controlled.  However not checking blood sugars regularly to no level of control at this time.  We will check A1c today.  Encouraged him to continue healthy eating habits and to move as much as he can.  Commended him on weight loss.  Being on Topamax probably played a role. - POCT glucose (manual entry) - Hemoglobin A1c  2. Microalbuminuria due to type 2 diabetes mellitus ([place]) Due to diabetes.  It has decreased compared to when previously checked.  Continue Cozaar/HCTZ.  Stressed importance of good diabetes and blood pressure control.  3. Hypertension associated with type 2 diabetes mellitus ([place]) At goal.  Continue current medications and low-salt diet  4. Migraine without aura and without status migrainosus, not intractable Slight improvement.  Recommend change Topamax to 25 mg in the morning and 50 mg at bedtime. - topiramate (TOPAMAX) 25 MG tablet; 1 tab PO Q a.m and 2 tabs Q p.m  Dispense: 90 tablet; Refill: 5  5. OSA treated with BiPAP Continue compliance with BiPAP.  6. GAD (generalized anxiety disorder) 7. Major depressive disorder, single episode, moderate ([place]) -I have resubmitted referral to [facility] behavioral health.  Referral sent to social worker to assist with getting appointment.  8. Hyperlipidemia associated with type 2 diabetes mellitus (HCC) Continue atorvastatin 40 mg.  We will check lipid profile today - Lipid panel  9.  Hep C screening Patient agreeable  to screening.  Advised patient that if he has applied for disability he can request his records to go to whichever company is working with him to try to get it.  Patient was given the opportunity to ask questions.  Patient verbalized understanding of the plan and was able to repeat key elements of the plan.   Orders Placed This Encounter  Procedures  . Hemoglobin A1c  . Lipid panel  . Hepatitis C Antibody  . Ambulatory referral to Psychiatry  . POCT glucose (manual entry)     Requested Prescriptions   Signed Prescriptions Disp Refills  . topiramate (TOPAMAX) 25 MG tablet 90 tablet 5    Sig: 1 tab PO Q a.m and 2 tabs Q p.m    Return in about 4 months (around 12/28/2020).  Karle Plumber, MD, FACP

## 2020-08-27 NOTE — Patient Instructions (Signed)
I have referred you to behavioral health.  They will call you with an appointment. Increase Topamax to 25 mg in the morning and 50 mg in the evenings.

## 2020-08-28 ENCOUNTER — Other Ambulatory Visit: Payer: Self-pay | Admitting: Internal Medicine

## 2020-08-28 DIAGNOSIS — E1142 Type 2 diabetes mellitus with diabetic polyneuropathy: Secondary | ICD-10-CM

## 2020-08-28 LAB — HEMOGLOBIN A1C
Est. average glucose Bld gHb Est-mCnc: 143 mg/dL
Hgb A1c MFr Bld: 6.6 % — ABNORMAL HIGH (ref 4.8–5.6)

## 2020-08-28 LAB — LIPID PANEL
Chol/HDL Ratio: 4 ratio (ref 0.0–5.0)
Cholesterol, Total: 183 mg/dL (ref 100–199)
HDL: 46 mg/dL (ref >39)
LDL Chol Calc (NIH): 93 mg/dL (ref 0–99)
Triglycerides: 265 mg/dL — ABNORMAL HIGH (ref 0–149)
VLDL Cholesterol Cal: 44 mg/dL — ABNORMAL HIGH (ref 5–40)

## 2020-08-28 LAB — HEPATITIS C ANTIBODY: Hep C Virus Ab: <0.1 s/co ratio (ref 0.0–0.9)

## 2020-08-28 MED ORDER — ATORVASTATIN CALCIUM 40 MG PO TABS
60.0000 mg | ORAL_TABLET | Freq: Every day | ORAL | 5 refills | Status: DC
  Filled 2020-08-28 – 2020-08-29 (×2): qty 45, 30d supply, fill #0
  Filled 2020-10-07: qty 45, 30d supply, fill #1
  Filled 2020-11-10: qty 45, 30d supply, fill #2
  Filled 2020-12-10: qty 45, 30d supply, fill #3
  Filled 2021-01-11 (×2): qty 45, 30d supply, fill #4
  Filled 2021-02-07: qty 45, 30d supply, fill #5

## 2020-08-29 ENCOUNTER — Other Ambulatory Visit: Payer: Self-pay

## 2020-09-06 ENCOUNTER — Encounter: Payer: Self-pay | Admitting: Internal Medicine

## 2020-09-10 MED FILL — Amlodipine Besylate Tab 10 MG (Base Equivalent): ORAL | 30 days supply | Qty: 30 | Fill #2 | Status: AC

## 2020-09-11 ENCOUNTER — Other Ambulatory Visit: Payer: Self-pay

## 2020-09-11 DIAGNOSIS — A539 Syphilis, unspecified: Secondary | ICD-10-CM

## 2020-09-11 NOTE — Progress Notes (Signed)
Entered order in error

## 2020-09-11 NOTE — Addendum Note (Signed)
Addended by: Eugenia Mcalpine on: 09/11/2020 04:20 PM   Modules accepted: Orders

## 2020-09-12 ENCOUNTER — Other Ambulatory Visit: Payer: Self-pay

## 2020-09-29 MED FILL — Losartan Potassium & Hydrochlorothiazide Tab 100-12.5 MG: ORAL | 30 days supply | Qty: 30 | Fill #2 | Status: AC

## 2020-10-01 ENCOUNTER — Ambulatory Visit: Payer: Self-pay | Admitting: *Deleted

## 2020-10-01 ENCOUNTER — Other Ambulatory Visit: Payer: Self-pay

## 2020-10-01 ENCOUNTER — Other Ambulatory Visit: Payer: Self-pay | Admitting: Internal Medicine

## 2020-10-01 MED ORDER — ALBUTEROL SULFATE HFA 108 (90 BASE) MCG/ACT IN AERS
2.0000 | INHALATION_SPRAY | Freq: Four times a day (QID) | RESPIRATORY_TRACT | 2 refills | Status: DC | PRN
  Filled 2020-10-01: qty 8, fill #0

## 2020-10-01 MED ORDER — ALBUTEROL SULFATE HFA 108 (90 BASE) MCG/ACT IN AERS
2.0000 | INHALATION_SPRAY | Freq: Four times a day (QID) | RESPIRATORY_TRACT | 2 refills | Status: DC | PRN
  Filled 2020-10-01: qty 8.5, 25d supply, fill #0

## 2020-10-01 NOTE — Addendum Note (Signed)
Addended by: Karle Plumber B on: 10/01/2020 04:29 PM   Modules accepted: Orders

## 2020-10-01 NOTE — Telephone Encounter (Signed)
Cough and scratchy throat since Saturday. This morning coughed up pink-tinged sputum once, no clots/no streaks. Intermittent sinus congestion and runny nose. Denies fever/dizziness/muscle aches/wheeze. Negative at home Covid results on 1st day of symptoms, Saturday. Chest soreness with coughing Instructed to start mucinex with increased daily water intake.Keep home temperature cool Warm fluids for ST Retake Covid test Has abuterol inhaler that has expired.  Requesting new inhaler if possible. Advised to monitor his breathing-go to ED if needed.  Routing to clinic for consideration.    Reason for Disposition  Coughing up blood (all other triage questions negative)  Answer Assessment - Initial Assessment Questions 1. ONSET: "When did the cough begin?"      2 days 2. SEVERITY: "How bad is the cough today?" "Did the blood appear after a coughing spell?"      Hard cough 3. SPUTUM: "Describe the color of your sputum" (none, dry cough; clear, white, yellow, green)      4. HEMOPTYSIS: "How much blood?" (flecks, streaks, tablespoons, etc.)     Very small amount in phlegm 5. DIFFICULTY BREATHING: "Are you having difficulty breathing?" If Yes, ask: "How bad is it?" (e.g., mild, moderate, severe)    - MILD: No SOB at rest, mild SOB with walking, speaks normally in sentences, can lie down, no retractions, pulse < 100.    - MODERATE: SOB at rest, SOB with minimal exertion and prefers to sit, cannot lie down flat, speaks in phrases, mild retractions, audible wheezing, pulse 100-120.    - SEVERE: Very SOB at rest, speaks in single words, struggling to breathe, sitting hunched forward, retractions, pulse > 120      none 6. FEVER: "Do you have a fever?" If Yes, ask: "What is your temperature, how was it measured, and when did it start?"     no 7. CARDIAC HISTORY: "Do you have any history of heart disease?" (e.g., heart attack, congestive heart failure)      no 8. LUNG HISTORY: "Do you have any  history of lung disease?"  (e.g., pulmonary embolus, asthma, emphysema)     no 9. PE RISK FACTORS: "Do you have a history of blood clots?" (or: recent major surgery, recent prolonged travel, bedridden)     no 10. OTHER SYMPTOMS: "Do you have any other symptoms?" (e.g., runny nose, wheezing, chest pain)       Runny nose and congestion 11. PREGNANCY: "Is there any chance you are pregnant?" "When was your last menstrual period?"       na 12. TRAVEL: "Have you traveled out of the country in the last month?" (e.g., travel history, exposures)       no  Protocols used: Coughing Up Blood-A-AH

## 2020-10-02 ENCOUNTER — Other Ambulatory Visit: Payer: Self-pay

## 2020-10-08 ENCOUNTER — Other Ambulatory Visit: Payer: Self-pay

## 2020-10-09 ENCOUNTER — Other Ambulatory Visit: Payer: Self-pay

## 2020-10-14 MED FILL — Sertraline HCl Tab 100 MG: ORAL | 30 days supply | Qty: 45 | Fill #1 | Status: AC

## 2020-10-14 MED FILL — Amlodipine Besylate Tab 10 MG (Base Equivalent): ORAL | 30 days supply | Qty: 30 | Fill #3 | Status: AC

## 2020-10-16 ENCOUNTER — Other Ambulatory Visit: Payer: Self-pay

## 2020-10-27 MED FILL — Losartan Potassium & Hydrochlorothiazide Tab 100-12.5 MG: ORAL | 30 days supply | Qty: 30 | Fill #3 | Status: AC

## 2020-10-29 ENCOUNTER — Other Ambulatory Visit: Payer: Self-pay

## 2020-11-01 ENCOUNTER — Ambulatory Visit: Payer: No Typology Code available for payment source | Admitting: Physician Assistant

## 2020-11-10 MED FILL — Amlodipine Besylate Tab 10 MG (Base Equivalent): ORAL | 30 days supply | Qty: 30 | Fill #4 | Status: AC

## 2020-11-12 ENCOUNTER — Other Ambulatory Visit: Payer: Self-pay

## 2020-11-17 ENCOUNTER — Encounter: Payer: Self-pay | Admitting: Internal Medicine

## 2020-11-17 DIAGNOSIS — G43009 Migraine without aura, not intractable, without status migrainosus: Secondary | ICD-10-CM

## 2020-11-19 ENCOUNTER — Encounter: Payer: Self-pay | Admitting: Internal Medicine

## 2020-11-25 ENCOUNTER — Other Ambulatory Visit: Payer: Self-pay | Admitting: Internal Medicine

## 2020-11-25 DIAGNOSIS — I10 Essential (primary) hypertension: Secondary | ICD-10-CM

## 2020-11-25 MED FILL — Sertraline HCl Tab 100 MG: ORAL | 30 days supply | Qty: 45 | Fill #2 | Status: AC

## 2020-11-25 NOTE — Telephone Encounter (Signed)
Requested medication (s) are due for refill today: yes  Requested medication (s) are on the active medication list: yes  Last refill:  04/27/20 #30 6 RF  Future visit scheduled: yes  Notes to clinic:  overdue labs   Requested Prescriptions  Pending Prescriptions Disp Refills   losartan-hydrochlorothiazide (HYZAAR) 100-12.5 MG tablet 30 tablet 6    Sig: TAKE 1 TABLET BY MOUTH DAILY.     Cardiovascular: ARB + Diuretic Combos Failed - 11/25/2020 12:19 AM      Failed - K in normal range and within 180 days    Potassium  Date Value Ref Range Status  08/18/2019 3.5 3.5 - 5.1 mmol/L Final          Failed - Na in normal range and within 180 days    Sodium  Date Value Ref Range Status  08/18/2019 138 135 - 145 mmol/L Final  08/10/2019 137 134 - 144 mmol/L Final          Failed - Cr in normal range and within 180 days    Creat  Date Value Ref Range Status  02/28/2016 0.70 0.60 - 1.35 mg/dL Final   Creatinine, Ser  Date Value Ref Range Status  08/18/2019 0.84 0.61 - 1.24 mg/dL Final   Creatinine, POC  Date Value Ref Range Status  12/10/2016 300 mg/dL Final          Failed - Ca in normal range and within 180 days    Calcium  Date Value Ref Range Status  08/18/2019 8.5 (L) 8.9 - 10.3 mg/dL Final   Calcium, Ion  Date Value Ref Range Status  11/16/2017 1.12 (L) 1.15 - 1.40 mmol/L Final          Passed - Patient is not pregnant      Passed - Last BP in normal range    BP Readings from Last 1 Encounters:  08/27/20 117/81          Passed - Valid encounter within last 6 months    Recent Outpatient Visits           3 months ago Type 2 diabetes mellitus with diabetic polyneuropathy, without long-term current use of insulin ([place])   [place] Karle Plumber B, MD   6 months ago OSA (obstructive sleep apnea)   [place] Elsie Stain, MD   6 months ago Dermatitis   [place] Elsie Stain, MD   7 months ago Type 2 diabetes mellitus with diabetic polyneuropathy, without long-term current use of insulin [facility])   [place], MD   9 months ago OSA (obstructive sleep apnea)   [place], MD       Future Appointments             In 1 month [name] Dalbert Batman, MD [place]

## 2020-11-26 ENCOUNTER — Other Ambulatory Visit: Payer: Self-pay

## 2020-11-26 MED ORDER — LOSARTAN POTASSIUM-HCTZ 100-12.5 MG PO TABS
1.0000 | ORAL_TABLET | Freq: Every day | ORAL | 6 refills | Status: DC
  Filled 2020-11-26: qty 30, 30d supply, fill #0
  Filled 2020-12-28: qty 30, 30d supply, fill #1

## 2020-11-27 ENCOUNTER — Other Ambulatory Visit: Payer: Self-pay

## 2020-12-03 ENCOUNTER — Other Ambulatory Visit: Payer: Self-pay

## 2020-12-04 ENCOUNTER — Other Ambulatory Visit: Payer: Self-pay

## 2020-12-10 ENCOUNTER — Other Ambulatory Visit: Payer: Self-pay

## 2020-12-11 ENCOUNTER — Other Ambulatory Visit: Payer: Self-pay | Admitting: Internal Medicine

## 2020-12-11 DIAGNOSIS — I10 Essential (primary) hypertension: Secondary | ICD-10-CM

## 2020-12-12 ENCOUNTER — Other Ambulatory Visit: Payer: Self-pay

## 2020-12-12 MED ORDER — AMLODIPINE BESYLATE 10 MG PO TABS
ORAL_TABLET | Freq: Every day | ORAL | 2 refills | Status: DC
  Filled 2020-12-12: qty 30, 30d supply, fill #0

## 2020-12-12 NOTE — Telephone Encounter (Signed)
Requested Prescriptions  Pending Prescriptions Disp Refills  . amLODipine (NORVASC) 10 MG tablet 30 tablet 2    Sig: TAKE 1 TABLET (10 MG TOTAL) BY MOUTH DAILY.     Cardiovascular:  Calcium Channel Blockers Passed - 12/11/2020  5:26 PM      Passed - Last BP in normal range    BP Readings from Last 1 Encounters:  08/27/20 117/81         Passed - Valid encounter within last 6 months    Recent Outpatient Visits          3 months ago Type 2 diabetes mellitus with diabetic polyneuropathy, without long-term current use of insulin ([place])   [place] Karle Plumber B, MD   6 months ago OSA (obstructive sleep apnea)   [place] Elsie Stain, MD   7 months ago Dermatitis   [place] Elsie Stain, MD   7 months ago Type 2 diabetes mellitus with diabetic polyneuropathy, without long-term current use of insulin [facility])   [place], MD   10 months ago OSA (obstructive sleep apnea)   [place], MD      Future Appointments            In 2 weeks Ladell Pier, MD [place]

## 2020-12-28 ENCOUNTER — Other Ambulatory Visit: Payer: Self-pay

## 2020-12-28 ENCOUNTER — Encounter: Payer: Self-pay | Admitting: Internal Medicine

## 2020-12-28 ENCOUNTER — Ambulatory Visit: Payer: Self-pay | Attending: Internal Medicine | Admitting: Internal Medicine

## 2020-12-28 VITALS — BP 112/80 | HR 119 | Resp 16 | Wt 311.2 lb

## 2020-12-28 DIAGNOSIS — E1159 Type 2 diabetes mellitus with other circulatory complications: Secondary | ICD-10-CM

## 2020-12-28 DIAGNOSIS — R12 Heartburn: Secondary | ICD-10-CM | POA: Insufficient documentation

## 2020-12-28 DIAGNOSIS — I10 Essential (primary) hypertension: Secondary | ICD-10-CM | POA: Insufficient documentation

## 2020-12-28 DIAGNOSIS — I152 Hypertension secondary to endocrine disorders: Secondary | ICD-10-CM

## 2020-12-28 DIAGNOSIS — J449 Chronic obstructive pulmonary disease, unspecified: Secondary | ICD-10-CM | POA: Insufficient documentation

## 2020-12-28 DIAGNOSIS — F411 Generalized anxiety disorder: Secondary | ICD-10-CM

## 2020-12-28 DIAGNOSIS — R079 Chest pain, unspecified: Secondary | ICD-10-CM

## 2020-12-28 DIAGNOSIS — Z79899 Other long term (current) drug therapy: Secondary | ICD-10-CM | POA: Insufficient documentation

## 2020-12-28 DIAGNOSIS — Z87891 Personal history of nicotine dependence: Secondary | ICD-10-CM | POA: Insufficient documentation

## 2020-12-28 DIAGNOSIS — E1142 Type 2 diabetes mellitus with diabetic polyneuropathy: Secondary | ICD-10-CM

## 2020-12-28 DIAGNOSIS — Z833 Family history of diabetes mellitus: Secondary | ICD-10-CM | POA: Insufficient documentation

## 2020-12-28 DIAGNOSIS — R002 Palpitations: Secondary | ICD-10-CM

## 2020-12-28 DIAGNOSIS — G4733 Obstructive sleep apnea (adult) (pediatric): Secondary | ICD-10-CM | POA: Insufficient documentation

## 2020-12-28 DIAGNOSIS — G43009 Migraine without aura, not intractable, without status migrainosus: Secondary | ICD-10-CM

## 2020-12-28 LAB — GLUCOSE, POCT (MANUAL RESULT ENTRY): POC Glucose: 227 mg/dl — AB (ref 70–99)

## 2020-12-28 MED ORDER — TOPIRAMATE 50 MG PO TABS
50.0000 mg | ORAL_TABLET | Freq: Two times a day (BID) | ORAL | 6 refills | Status: DC
  Filled 2020-12-28: qty 60, 30d supply, fill #0

## 2020-12-28 MED ORDER — BISOPROLOL FUMARATE 5 MG PO TABS
5.0000 mg | ORAL_TABLET | Freq: Every day | ORAL | 5 refills | Status: DC
  Filled 2020-12-28: qty 30, 30d supply, fill #0

## 2020-12-28 MED FILL — Sertraline HCl Tab 100 MG: ORAL | 30 days supply | Qty: 45 | Fill #3 | Status: AC

## 2020-12-28 NOTE — Progress Notes (Signed)
Patient ID: Juan Kline, male    DOB: 1970-11-02  MRN: [id number]  CC: Follow-up and Diabetes   Subjective: Juan Kline is a 50 y.o. male who presents for chronic ds management His concerns today include:  HTN, HL, DM type 2 with neuropathy, anxiety, chronic fatigue, severe OSA on BiPAP, vapes, mix COPD, obesity, gastritis (EGD 08/2019).    Patient with several concerns today.  Migraines:  had sent me Mychart messages regarding ongoing issues with headache.  I had referred him back to [facility] neurology.  However patient states he has not received any calls as yet regarding an appointment there. Topamax helps "but I'm still not right."   Dep/Anx: Referred to behavioral health on last visit.  Has not heard anything from [facility] as prescribe -Reports some increased anxiety/panic attacks.  He has been having episodes where he can be sitting down and he starts getting palpitations.  This has been happening several times a day over the past few weeks.  He also reports some intermittent chest pains usually at rest.  Some lasts only a few seconds.  Others can last several minutes like an aching in the chest.  He is not been active as much as he would like because of ongoing issues with headache.  Gets heartburn intermittently soon after taking Norvasc.  Using Omeprazole PRN. He wonders whether Norvasc is causing some of his other issues including headaches.  States that he has researched it online and is found out the people having some of his same symptoms and overall just not feeling right when they take Norvasc.  HTN: not checking BP lately.  He tries to limit salt in the foods.  Reports compliance with Hyzaar and amlodipine.  DM: Not checking BS in a while.  He has been diet controlled.  A1c on last visit was less than 7. Doing bood with eating habits. Taking Lipitor.  Would like to have his cholesterol rechecked to see if his LDL is now at goal.    He has a form with him  today for which he is trying to apply for disability.  Wants to know whether I can fill it out. Patient Active Problem List   Diagnosis Date Noted   Dermatitis 05/15/2020   Hx of migraine headaches 01/24/2020   Lipomatosis of subcutaneous tissue 12/08/2019   Tinnitus of both ears 11/08/2019   Abnormal MRI of the head 08/24/2019   Acute superficial gastritis without hemorrhage 08/24/2019   COPD mixed type ([place]) 04/04/2019   Positive for macroalbuminuria 03/06/2019   Vapes nicotine containing substance 03/03/2019   Class 3 severe obesity due to excess calories without serious comorbidity with body mass index (BMI) of 45.0 to 49.9 in adult [facility]) 03/03/2019   Hyperlipidemia associated with type 2 diabetes mellitus ([place]) 03/03/2019   Type 2 diabetes mellitus with diabetic polyneuropathy, without long-term current use of insulin ([place]) 04/13/2018   Mild depression ([place]) 04/01/2018   OSA (obstructive sleep apnea) 10/26/2017   Anxiety disorder 10/26/2017   Peripheral neuropathy 12/12/2016   GAD (generalized anxiety disorder) 05/11/2015   Former smoker 05/11/2015   Other hyperlipidemia 05/22/2014   Vitamin D insufficiency 05/22/2014   Chronic intractable headache 05/19/2014   Numbness and tingling of foot 05/19/2014   Venous stasis dermatitis of both lower extremities 05/19/2014   history of Hypertension 03/02/2014   Morbid obesity ([place]) 03/02/2014     Current Outpatient Medications on File Prior to Visit  Medication Sig Dispense Refill  atorvastatin (LIPITOR) 40 MG tablet Take 1.5 tablets (60 mg total) by mouth daily. 45 tablet 5   clotrimazole-betamethasone (LOTRISONE) cream APPLY 1 APPLICATION TOPICALLY 2 (TWO) TIMES DAILY. 45 g 0   losartan-hydrochlorothiazide (HYZAAR) 100-12.5 MG tablet TAKE 1 TABLET BY MOUTH DAILY. 30 tablet 6   sertraline (ZOLOFT) 100 MG tablet TAKE 1.5 TABLETS (150 MG TOTAL) BY MOUTH DAILY. 45 tablet 11   albuterol (VENTOLIN HFA) 108 (90 Base) MCG/ACT inhaler  Inhale 2 puffs into the lungs every 6 (six) hours as needed for wheezing or shortness of breath. 8.5 g 2   omeprazole (PRILOSEC) 20 MG capsule Take 20 mg by mouth as needed. (Patient not taking: Reported on 12/28/2020)     No current facility-administered medications on file prior to visit.    Allergies  Allergen Reactions   Lyrica [Pregabalin] Anxiety    Social History   Socioeconomic History   Marital status: Single    Spouse name: Not on file   Number of children: 0   Years of education: Not on file   Highest education level: Not on file  Occupational History   Not on file  Tobacco Use   Smoking status: Former    Packs/day: 1.50    Years: 26.00    Pack years: 39.00    Types: E-cigarettes, Cigarettes    Start date: 1989    Quit date: 02/16/2014    Years since quitting: 6.8   Smokeless tobacco: Never   Tobacco comments:    occasional vapes - no nicotine  Vaping Use   Vaping Use: Every day   Start date: 04/15/2015   Last attempt to quit: 04/11/2019   Substances: Nicotine  Substance and Sexual Activity   Alcohol use: Not Currently    Comment: 03/02/2014 "I rarely drink"   Drug use: No   Sexual activity: Not Currently  Other Topics Concern   Not on file  Social History Narrative   Pt lives in 1 story home with his mother and step father   9th grade education   Last occupation was with [employer] in 2009      Unemployed at this time- thinking of filing for disability      Right handed      Social Determinants of Health   Financial Resource Strain: Not on file  Food Insecurity: Not on file  Transportation Needs: Not on file  Physical Activity: Not on file  Stress: Not on file  Social Connections: Not on file  Intimate Partner Violence: Not on file    Family History  Problem Relation Age of Onset   Hypertension Mother    Hyperlipidemia Mother    Diabetes Mother    Kidney disease Mother    Hypertension Father    Hyperlipidemia Father    Diabetes Brother     Hypertension Brother    Hyperlipidemia Brother    Heart disease Maternal Grandmother    COPD Maternal Grandmother    Diabetes Maternal Grandfather    Heart attack Paternal Grandmother    Heart disease Paternal Grandfather    Colon cancer Neg Hx    Colon polyps Neg Hx    Stomach cancer Neg Hx    Esophageal cancer Neg Hx    Ulcerative colitis Neg Hx     Past Surgical History:  Procedure Laterality Date   BIOPSY  08/18/2019   Procedure: BIOPSY;  Surgeon: Thornton Park, MD;  Location: [facility];  Service: Gastroenterology;;   ESOPHAGOGASTRODUODENOSCOPY (EGD) WITH PROPOFOL N/A 08/18/2019  Procedure: ESOPHAGOGASTRODUODENOSCOPY (EGD) WITH PROPOFOL;  Surgeon: Thornton Park, MD;  Location: [facility];  Service: Gastroenterology;  Laterality: N/A;   NO PAST SURGERIES      ROS: Review of Systems Negative except as stated above  PHYSICAL EXAM: BP 112/80   Pulse (!) 119   Resp 16   Wt (!) 311 lb 3.2 oz (141.2 kg)   SpO2 95%   BMI 45.96 kg/m   Wt Readings from Last 3 Encounters:  12/28/20 (!) 311 lb 3.2 oz (141.2 kg)  08/27/20 (!) 309 lb 6.4 oz (140.3 kg)  05/15/20 (!) 319 lb (144.7 kg)    Physical Exam General appearance - alert, well appearing, obese middle-age Caucasian male and in no distress Mental status - normal mood, behavior, speech, dress, motor activity, and thought processes Neck - supple, no significant adenopathy Chest - clear to auscultation, no wheezes, rales or rhonchi, symmetric air entry Heart - normal rate, regular rhythm, normal S1, S2, no murmurs, rubs, clicks or gallops Extremities - peripheral pulses normal, no pedal edema, no clubbing or cyanosis  CMP Latest Ref Rng & Units 08/18/2019 08/17/2019 08/15/2019  Glucose 70 - 99 mg/dL 124(H) 129(H) 184(H)  BUN 6 - 20 mg/dL 16 21(H) 23(H)  Creatinine 0.61 - 1.24 mg/dL 0.84 0.87 0.86  Sodium 135 - 145 mmol/L 138 136 136  Potassium 3.5 - 5.1 mmol/L 3.5 3.5 3.6  Chloride 98 - 111 mmol/L 100 98 98  CO2  22 - 32 mmol/L '26 25 23  '$ Calcium 8.9 - 10.3 mg/dL 8.5(L) 9.0 9.5  Total Protein 6.5 - 8.1 g/dL - 7.8 7.9  Total Bilirubin 0.3 - 1.2 mg/dL - 0.6 0.3  Alkaline Phos 38 - 126 U/L - 56 56  AST 15 - 41 U/L - 28 34  ALT 0 - 44 U/L - 36 33   Lipid Panel     Component Value Date/Time   CHOL 183 08/27/2020 1436   TRIG 265 (H) 08/27/2020 1436   HDL 46 08/27/2020 1436   CHOLHDL 4.0 08/27/2020 1436   CHOLHDL 5.6 (H) 02/28/2016 1244   VLDL 65 (H) 02/28/2016 1244   LDLCALC 93 08/27/2020 1436    CBC    Component Value Date/Time   WBC 10.6 (H) 10/07/2019 1229   RBC 4.88 10/07/2019 1229   HGB 13.6 10/07/2019 1229   HGB 14.5 08/10/2019 1146   HCT 40.5 10/07/2019 1229   HCT 42.4 08/10/2019 1146   PLT 285.0 10/07/2019 1229   PLT 301 08/10/2019 1146   MCV 83.0 10/07/2019 1229   MCV 81 08/10/2019 1146   MCH 27.6 08/18/2019 0408   MCHC 33.5 10/07/2019 1229   RDW 14.3 10/07/2019 1229   RDW 13.4 08/10/2019 1146   LYMPHSABS 2.4 08/14/2019 0141   LYMPHSABS 2.0 08/10/2019 1146   MONOABS 0.8 08/14/2019 0141   EOSABS 0.2 08/14/2019 0141   EOSABS 0.1 08/10/2019 1146   BASOSABS 0.0 08/14/2019 0141   BASOSABS 0.0 08/10/2019 1146   EKG: Sinus rhythm with rate of 84, first-degree AV block.  Poor R wave progression in the septal leads that appears new.  ASSESSMENT AND PLAN: 1. Migraine without aura and without status migrainosus, not intractable Message sent to our referral coordinator inquiring about his appointment with neurology at [facility].  In the meantime, I recommend increasing Topamax to 50 mg twice a day. - topiramate (TOPAMAX) 50 MG tablet; Take 1 tablet (50 mg total) by mouth 2 (two) times daily. take one tablet by mouth every morning.  then take two tablets by mouth in the evening.  Dispense: 60 tablet; Refill: 6  2. GAD (generalized anxiety disorder) Message sent to our LCSW requesting that she assist with getting him an appointment with [facility] behavioral  health.  3. Palpitations May be related to anxiety.  However we do need to rule out other things.  I will check his thyroid level and chemistry.  Refer to cardiology as he may need to wear a Holter monitor - TSH+T4F+T3Free - EKG [id number] - Basic Metabolic Panel - Ambulatory referral to Cardiology  4. Chest pain in adult Sounds atypical.  However he does have risk factors. - Ambulatory referral to Cardiology  5. Hypertension associated with type 2 diabetes mellitus ([place]) Patient concerned that amlodipine may be causing him to feel bad and causing some issues with heartburn.  We agreed to give a trial of stopping the amlodipine and trying him with a different blood pressure medication. - bisoprolol (ZEBETA) 5 MG tablet; Take 1 tablet (5 mg total) by mouth daily.  Dispense: 30 tablet; Refill: 5  6. Type 2 diabetes mellitus with diabetic polyneuropathy, without long-term current use of insulin ([place]) Encouraged him to continue trying to eat healthy. - POCT glucose (manual entry) - Hemoglobin A1c - Lipid panel   Advised patient that I do not fill out disability forms. I do recommend that once he applies he request his records to be sent to them.   Patient was given the opportunity to ask questions.  Patient verbalized understanding of the plan and was able to repeat key elements of the plan.   Orders Placed This Encounter  Procedures   Hemoglobin A1c   Lipid panel   [id number]   Basic Metabolic Panel   Ambulatory referral to Cardiology   POCT glucose (manual entry)   EKG 12-Lead     Requested Prescriptions   Signed Prescriptions Disp Refills   topiramate (TOPAMAX) 50 MG tablet 60 tablet 6    Sig: Take 1 tablet (50 mg total) by mouth 2 (two) times daily. take one tablet by mouth every morning. then take two tablets by mouth in the evening.   bisoprolol (ZEBETA) 5 MG tablet 30 tablet 5    Sig: Take 1 tablet (5 mg total) by mouth daily.    Return in about 4 months (around  04/29/2021).  Karle Plumber, MD, FACP

## 2020-12-28 NOTE — Patient Instructions (Signed)
Stop Amlodipine. Increase Topamax to 50 mg twice a day. I have referred you to cardiology

## 2020-12-29 ENCOUNTER — Encounter: Payer: Self-pay | Admitting: Internal Medicine

## 2020-12-29 DIAGNOSIS — G4733 Obstructive sleep apnea (adult) (pediatric): Secondary | ICD-10-CM

## 2020-12-30 ENCOUNTER — Encounter: Payer: Self-pay | Admitting: Internal Medicine

## 2020-12-31 ENCOUNTER — Other Ambulatory Visit: Payer: Self-pay

## 2021-01-02 ENCOUNTER — Encounter: Payer: Self-pay | Admitting: Internal Medicine

## 2021-01-03 ENCOUNTER — Other Ambulatory Visit: Payer: Self-pay

## 2021-01-03 ENCOUNTER — Ambulatory Visit: Payer: No Typology Code available for payment source | Admitting: Cardiovascular Disease

## 2021-01-03 ENCOUNTER — Observation Stay (HOSPITAL_COMMUNITY)
Admission: EM | Admit: 2021-01-03 | Discharge: 2021-01-05 | Disposition: A | Discharge status: Home / Self Care | Payer: No Typology Code available for payment source | Attending: Internal Medicine | Admitting: Internal Medicine

## 2021-01-03 ENCOUNTER — Encounter (HOSPITAL_COMMUNITY): Payer: Self-pay | Admitting: Emergency Medicine

## 2021-01-03 ENCOUNTER — Emergency Department (HOSPITAL_COMMUNITY): Payer: No Typology Code available for payment source

## 2021-01-03 DIAGNOSIS — R0789 Other chest pain: Principal | ICD-10-CM | POA: Insufficient documentation

## 2021-01-03 DIAGNOSIS — J449 Chronic obstructive pulmonary disease, unspecified: Secondary | ICD-10-CM | POA: Diagnosis present

## 2021-01-03 DIAGNOSIS — E119 Type 2 diabetes mellitus without complications: Secondary | ICD-10-CM | POA: Insufficient documentation

## 2021-01-03 DIAGNOSIS — Z87891 Personal history of nicotine dependence: Secondary | ICD-10-CM | POA: Insufficient documentation

## 2021-01-03 DIAGNOSIS — E1142 Type 2 diabetes mellitus with diabetic polyneuropathy: Secondary | ICD-10-CM | POA: Diagnosis present

## 2021-01-03 DIAGNOSIS — G4733 Obstructive sleep apnea (adult) (pediatric): Secondary | ICD-10-CM | POA: Diagnosis present

## 2021-01-03 DIAGNOSIS — E1169 Type 2 diabetes mellitus with other specified complication: Secondary | ICD-10-CM | POA: Diagnosis present

## 2021-01-03 DIAGNOSIS — R079 Chest pain, unspecified: Secondary | ICD-10-CM | POA: Diagnosis present

## 2021-01-03 DIAGNOSIS — Z79899 Other long term (current) drug therapy: Secondary | ICD-10-CM | POA: Insufficient documentation

## 2021-01-03 DIAGNOSIS — I10 Essential (primary) hypertension: Secondary | ICD-10-CM | POA: Diagnosis present

## 2021-01-03 DIAGNOSIS — E785 Hyperlipidemia, unspecified: Secondary | ICD-10-CM | POA: Diagnosis present

## 2021-01-03 HISTORY — DX: Atherosclerosis of aorta: I70.0

## 2021-01-03 HISTORY — DX: Atrioventricular block, first degree: I44.0

## 2021-01-03 LAB — CBC
HCT: 44.2 % (ref 39.0–52.0)
Hemoglobin: 14.8 g/dL (ref 13.0–17.0)
MCH: 28.6 pg (ref 26.0–34.0)
MCHC: 33.5 g/dL (ref 30.0–36.0)
MCV: 85.5 fL (ref 80.0–100.0)
Platelets: 297 10*3/uL (ref 150–400)
RBC: 5.17 MIL/uL (ref 4.22–5.81)
RDW: 13.2 % (ref 11.5–15.5)
WBC: 9.7 10*3/uL (ref 4.0–10.5)
nRBC: 0 % (ref 0.0–0.2)

## 2021-01-03 LAB — BASIC METABOLIC PANEL
Anion gap: 11 (ref 5–15)
BUN: 28 mg/dL — ABNORMAL HIGH (ref 6–20)
CO2: 23 mmol/L (ref 22–32)
Calcium: 9.4 mg/dL (ref 8.9–10.3)
Chloride: 102 mmol/L (ref 98–111)
Creatinine, Ser: 0.92 mg/dL (ref 0.61–1.24)
GFR, Estimated: >60 mL/min (ref >60)
Glucose, Bld: 157 mg/dL — ABNORMAL HIGH (ref 70–99)
Potassium: 3.5 mmol/L (ref 3.5–5.1)
Sodium: 136 mmol/L (ref 135–145)

## 2021-01-03 LAB — TROPONIN I (HIGH SENSITIVITY)
Troponin I (High Sensitivity): 6 ng/L (ref <18)
Troponin I (High Sensitivity): 8 ng/L (ref <18)

## 2021-01-03 LAB — CBG MONITORING, ED: Glucose-Capillary: 121 mg/dL — ABNORMAL HIGH (ref 70–99)

## 2021-01-03 MED ORDER — ACETAMINOPHEN 650 MG RE SUPP: 650.0000 mg | Freq: Four times a day (QID) | RECTAL | Status: DC | PRN

## 2021-01-03 MED ORDER — ATORVASTATIN CALCIUM 40 MG PO TABS
60.0000 mg | ORAL_TABLET | Freq: Every day | ORAL | Status: DC
  Administered 2021-01-04 (×2): 60 mg via ORAL
  Filled 2021-01-03: qty 2
  Filled 2021-01-03: qty 1
  Filled 2021-01-03: qty 2

## 2021-01-03 MED ORDER — AMLODIPINE BESYLATE 10 MG PO TABS
10.0000 mg | ORAL_TABLET | Freq: Every day | ORAL | 1 refills | Status: DC
  Filled 2021-01-03: qty 30, 30d supply, fill #0
  Filled 2021-02-14: qty 30, 30d supply, fill #1
  Filled 2021-03-15: qty 30, 30d supply, fill #2
  Filled 2021-04-19 – 2021-04-25 (×2): qty 30, 30d supply, fill #0
  Filled 2021-05-15 – 2021-05-17 (×2): qty 30, 30d supply, fill #1
  Filled 2021-06-18 – 2021-06-19 (×3): qty 30, 30d supply, fill #2

## 2021-01-03 MED ORDER — ALBUTEROL SULFATE HFA 108 (90 BASE) MCG/ACT IN AERS
2.0000 | INHALATION_SPRAY | Freq: Four times a day (QID) | RESPIRATORY_TRACT | Status: DC | PRN
  Filled 2021-01-03: qty 6.7

## 2021-01-03 MED ORDER — ASPIRIN EC 81 MG PO TBEC
81.0000 mg | DELAYED_RELEASE_TABLET | Freq: Every day | ORAL | Status: DC
  Administered 2021-01-03 – 2021-01-05 (×3): 81 mg via ORAL
  Filled 2021-01-03 (×3): qty 1

## 2021-01-03 MED ORDER — SERTRALINE HCL 100 MG PO TABS
100.0000 mg | ORAL_TABLET | Freq: Every day | ORAL | Status: DC
  Administered 2021-01-04 – 2021-01-05 (×2): 100 mg via ORAL
  Filled 2021-01-03 (×2): qty 1

## 2021-01-03 MED ORDER — INSULIN ASPART 100 UNIT/ML IJ SOLN
0.0000 [IU] | Freq: Three times a day (TID) | INTRAMUSCULAR | Status: DC
  Administered 2021-01-04 – 2021-01-05 (×2): 1 [IU] via SUBCUTANEOUS

## 2021-01-03 MED ORDER — ACETAMINOPHEN 325 MG PO TABS: 650.0000 mg | ORAL_TABLET | Freq: Four times a day (QID) | ORAL | Status: DC | PRN

## 2021-01-03 MED ORDER — LOSARTAN POTASSIUM 50 MG PO TABS
100.0000 mg | ORAL_TABLET | Freq: Every day | ORAL | Status: DC
  Administered 2021-01-04 – 2021-01-05 (×2): 100 mg via ORAL
  Filled 2021-01-03 (×2): qty 2

## 2021-01-03 MED ORDER — ENOXAPARIN SODIUM 40 MG/0.4ML IJ SOSY
40.0000 mg | PREFILLED_SYRINGE | INTRAMUSCULAR | Status: DC
  Administered 2021-01-03 – 2021-01-04 (×2): 40 mg via SUBCUTANEOUS
  Filled 2021-01-03 (×2): qty 0.4

## 2021-01-03 MED ORDER — AMLODIPINE BESYLATE 10 MG PO TABS
10.0000 mg | ORAL_TABLET | Freq: Every day | ORAL | Status: DC
  Administered 2021-01-04 – 2021-01-05 (×2): 10 mg via ORAL
  Filled 2021-01-03: qty 1
  Filled 2021-01-03: qty 2

## 2021-01-03 MED ORDER — TOPIRAMATE 25 MG PO TABS
50.0000 mg | ORAL_TABLET | Freq: Two times a day (BID) | ORAL | Status: DC
  Administered 2021-01-04 – 2021-01-05 (×3): 50 mg via ORAL
  Filled 2021-01-03 (×3): qty 2

## 2021-01-03 NOTE — H&P (Signed)
History and Physical    Juan Kline MBE:[id number] DOB: 09/09/1970 DOA: 01/03/2021  PCP: Ladell Pier, MD  Patient coming from: Home.  Chief Complaint: Chest pain.  HPI: Juan Kline is a 50 y.o. male with history of COPD, sleep apnea, hypertension presents to the ER with complaints of chest pain.  Patient states he has been having chest pain off and on for the last 3 weeks.  Pain is retrosternal pressure-like nonradiating present even at rest.  Denies any associated shortness of breath palpitation diaphoresis abdominal pain nausea vomiting.  He had gone to his PCP and is planned to refer to cardiology but since patient's pain worsened he came to the ER.  ED Course: In the ER patient chest x-ray EKG and cardiac markers were unremarkable.  Patient at the time of my exam was chest pain-free.  Given the risk factors patient admitted for further work-up of chest pain.  COVID test negative.  Review of Systems: As per HPI, rest all negative.   Past Medical History:  Diagnosis Date   Chronic foot ulcer ([place]) admitted 03/02/2014   medial border first metatarsal left foot  - resolved per patient 03/20/20   Diabetes ([place])    type 2 - no meds, diet controlled   Diabetic foot infection ([place])    GAD (generalized anxiety disorder)    GERD (gastroesophageal reflux disease)    Hx of migraines    Hyperlipidemia    Hypertension Dx 2009   Mild depression ([facility])    OSA (obstructive sleep apnea)    CPAP   Osteomyelitis left first metatarsal 03/02/2014   Sleep apnea    not using CPAP- machine being looked at for repair 03/21/20    Venous stasis ulcer of left lower extremity ([place])    [name] 03/02/2014 - resolved per patient 03/20/20    Past Surgical History:  Procedure Laterality Date   BIOPSY  08/18/2019   Procedure: BIOPSY;  Surgeon: Thornton Park, MD;  [facility];  Service: Gastroenterology;;   ESOPHAGOGASTRODUODENOSCOPY (EGD) WITH PROPOFOL N/A 08/18/2019   Procedure:  ESOPHAGOGASTRODUODENOSCOPY (EGD) WITH PROPOFOL;  Surgeon: Thornton Park, MD;  Location[facility];  Service: Gastroenterology;  Laterality: N/A;   NO PAST SURGERIES       reports that he quit smoking about 6 years ago. His smoking use included e-cigarettes and cigarettes. He started smoking about 33 years ago. He has a 39.00 pack-year smoking history. He has never used smokeless tobacco. He reports that he does not currently use alcohol. He reports that he does not use drugs.  Allergies  Allergen Reactions   Lyrica [Pregabalin] Anxiety    Family History  Problem Relation Age of Onset   Hypertension Mother    Hyperlipidemia Mother    Diabetes Mother    Kidney disease Mother    Hypertension Father    Hyperlipidemia Father    Diabetes Brother    Hypertension Brother    Hyperlipidemia Brother    Heart disease Maternal Grandmother    COPD Maternal Grandmother    Diabetes Maternal Grandfather    Heart attack Paternal Grandmother    Heart disease Paternal Grandfather    Colon cancer Neg Hx    Colon polyps Neg Hx    Stomach cancer Neg Hx    Esophageal cancer Neg Hx    Ulcerative colitis Neg Hx     Prior to Admission medications   Medication Sig Start Date End Date Taking? Authorizing Provider  albuterol (VENTOLIN HFA) 108 (90 Base) MCG/ACT  inhaler Inhale 2 puffs into the lungs every 6 (six) hours as needed for wheezing or shortness of breath. 10/01/20   Ladell Pier, MD  amLODipine (NORVASC) 10 MG tablet Take 1 tablet (10 mg total) by mouth daily. 01/03/21   Ladell Pier, MD  atorvastatin (LIPITOR) 40 MG tablet Take 1.5 tablets (60 mg total) by mouth daily. 08/28/20   Ladell Pier, MD  clotrimazole-betamethasone (LOTRISONE) cream APPLY 1 APPLICATION TOPICALLY 2 (TWO) TIMES DAILY. 05/15/20 05/15/21  Elsie Stain, MD  losartan-hydrochlorothiazide (HYZAAR) 100-12.5 MG tablet TAKE 1 TABLET BY MOUTH DAILY. 11/26/20 11/26/21  Ladell Pier, MD  omeprazole  (PRILOSEC) 20 MG capsule Take 20 mg by mouth as needed. Patient not taking: Reported on 12/28/2020    [provider]  sertraline (ZOLOFT) 100 MG tablet TAKE 1.5 TABLETS (150 MG TOTAL) BY MOUTH DAILY. 03/22/20 03/22/21  Pucilowski, Marchia Bond, MD  topiramate (TOPAMAX) 50 MG tablet Take 1 tablet (50 mg total) by mouth 2 (two) times daily. take one tablet by mouth every morning. then take two tablets by mouth in the evening. 12/28/20   Ladell Pier, MD    Physical Exam: Constitutional: Moderately built and nourished. Vitals:   01/03/21 1945 01/03/21 2000 01/03/21 2024 01/03/21 2030  BP: 125/73 92/62 112/67 105/73  Pulse: 68 64 67 66  Resp:   18 15  Temp:      TempSrc:      SpO2: 97% 97% 97% 96%   Eyes: Anicteric no pallor. ENMT: No discharge from the ears eyes nose and mouth. Neck: No mass felt.  No neck rigidity. Respiratory: No rhonchi or crepitations. Cardiovascular: S1-S2 heard. Abdomen: Soft nontender bowel sounds present. Musculoskeletal: No edema. Skin: No rash. Neurologic: Alert awake oriented to time place and person.  Moves all extremities. Psychiatric: Appears normal.  Normal affect.   Labs on Admission: I have personally reviewed following labs and imaging studies  CBC: Recent Labs  Lab 01/03/21 1332  WBC 9.7  HGB 14.8  HCT 44.2  MCV 85.5  PLT 588   Basic Metabolic Panel: Recent Labs  Lab 01/03/21 1332  NA 136  K 3.5  CL 102  CO2 23  GLUCOSE 157*  BUN 28*  CREATININE 0.92  CALCIUM 9.4   GFR: CrCl cannot be calculated (Unknown ideal weight.). Liver Function Tests: No results for input(s): AST, ALT, ALKPHOS, BILITOT, PROT, ALBUMIN in the last 168 hours. No results for input(s): LIPASE, AMYLASE in the last 168 hours. No results for input(s): AMMONIA in the last 168 hours. Coagulation Profile: No results for input(s): INR, PROTIME in the last 168 hours. Cardiac Enzymes: No results for input(s): CKTOTAL, CKMB, CKMBINDEX, TROPONINI in the  last 168 hours. BNP (last 3 results) No results for input(s): PROBNP in the last 8760 hours. HbA1C: No results for input(s): HGBA1C in the last 72 hours. CBG: No results for input(s): GLUCAP in the last 168 hours. Lipid Profile: No results for input(s): CHOL, HDL, LDLCALC, TRIG, CHOLHDL, LDLDIRECT in the last 72 hours. Thyroid Function Tests: No results for input(s): TSH, T4TOTAL, FREET4, T3FREE, THYROIDAB in the last 72 hours. Anemia Panel: No results for input(s): VITAMINB12, FOLATE, FERRITIN, TIBC, IRON, RETICCTPCT in the last 72 hours. Urine analysis:    Component Value Date/Time   COLORURINE YELLOW 08/15/2019 [place] 08/15/2019 1755   LABSPEC 1.025 08/15/2019 1755   PHURINE 6.0 08/15/2019 1755   GLUCOSEU NEGATIVE 08/15/2019 1755   [place] (A) 08/15/2019 1755  BILIRUBINUR NEGATIVE 08/15/2019 1755   BILIRUBINUR negative 05/26/2019 1116   Short 08/15/2019 1755   PROTEINUR 100 (A) 08/15/2019 1755   UROBILINOGEN 0.2 05/26/2019 1116   NITRITE NEGATIVE 08/15/2019 1755   LEUKOCYTESUR NEGATIVE 08/15/2019 1755   Sepsis Labs: @LABRCNTIP (procalcitonin:4,lacticidven:4) )No results found for this or any previous visit (from the past 240 hour(s)).   Radiological Exams on Admission: DG Chest 2 View  Result Date: 01/03/2021 CLINICAL DATA:  Chest pain EXAM: CHEST - 2 VIEW COMPARISON:  08/13/2019 FINDINGS: The heart size and mediastinal contours are within normal limits. Atherosclerotic calcification of the aortic knob. Focal eventration of the anterior right hemidiaphragm, unchanged. Minimal bibasilar atelectasis. Lungs are otherwise clear. No pleural effusion or pneumothorax. The visualized skeletal structures are unremarkable. IMPRESSION: Minimal bibasilar atelectasis. Otherwise no acute cardiopulmonary findings. Electronically Signed   By: Davina Poke D.O.   On: 01/03/2021 14:32    EKG: Independently reviewed.  Normal sinus rhythm first-degree AV  block.  Assessment/Plan Principal Problem:   Chest pain Active Problems:   history of Hypertension   OSA (obstructive sleep apnea)   Type 2 diabetes mellitus with diabetic polyneuropathy, without long-term current use of insulin ([facility])   Hyperlipidemia associated with type 2 diabetes mellitus ([facility])   COPD mixed type ([facility])    Chest pain -presently chest pain-free.  Has had cardiac stress test in 2018 which was unremarkable.  We will keep patient aspirin consult cardiology. Hypertension we will continue home medications including amlodipine losartan and hydrochlorothiazide. Hyperlipidemia on statins. COPD not actively wheezing continue with as needed inhaler. Diabetes mellitus presently on diet.  Will check CBGs with sliding scale coverage. Sleep apnea noncompliant with CPAP.   DVT prophylaxis: Lovenox. Code Status: Full code. Family Communication: Discussed with patient. Disposition Plan: Home. Consults called: Will need to consult cardiology. Admission status: Observation.   Rise Patience MD [facility] Pager [phone].  If 7PM-7AM, please contact night-coverage [email or web address] Password [facility]  01/03/2021, 8:47 PM

## 2021-01-03 NOTE — ED Provider Notes (Signed)
I have personally seen and examined the patient. I have reviewed the documentation on PMH/FH/Soc Hx. I have discussed the plan of care with the resident and patient.  I have reviewed and agree with the resident's documentation. Please see associated encounter note.  Briefly, the patient is a 50 y.o. male here with chest pain.  Exertional chest pain or shortness of breath over the last 2 weeks.  History of high cholesterol, diabetes, hypertension.  EKG shows sinus rhythm with first-degree block.  Troponin negative x2.  No significant anemia, electrolyte abnormality, kidney injury otherwise.  No PE risk factors.  Chest x-ray negative for infection.  Heart score 6.  Given exertional component of his symptoms and multiple cardiac risk factors will admit for further ACS rule out.  Did have unremarkable stress test about 4 years ago.  Will admit to medicine.  This chart was dictated using voice recognition software.  Despite best efforts to proofread,  errors can occur which can change the documentation meaning.    EKG Interpretation  Date/Time:  Thursday January 03 2021 13:12:04 EDT Ventricular Rate:  79 PR Interval:  270 QRS Duration: 104 QT Interval:  382 QTC Calculation: 438 R Axis:   -51 Text Interpretation: Sinus rhythm with 1st degree A-V block with Premature atrial complexes with Abberant conduction Low voltage QRS Left anterior fascicular block Abnormal ECG Confirmed by Lennice Sites ([id number]) on 01/03/2021 7:14:47 PM          Lennice Sites, DO 01/03/21 2034

## 2021-01-03 NOTE — ED Provider Notes (Signed)
Emergency Medicine Provider Triage Evaluation Note  Truman Aceituno , a 50 y.o. male  was evaluated in triage.  Pt complains of chest pain.  Patient states that same has been going on for several weeks.  Has been associated with "feeling weird" patient is unable to describe feeling further.  Endorses some shortness of breath.  Patient states that chest pain is located in the center of his chest and does not radiate.  Review of Systems  Positive: Chest pain Negative: Nausea, vomiting, diarrhea, fevers  Physical Exam  BP 106/73 (BP Location: Right Arm)   Pulse 79   Temp 98.9 F (37.2 C) (Oral)   Resp 16   SpO2 96%  Gen:   Awake, no distress   Resp:  Normal effort  MSK:   Moves extremities without difficulty    Medical Decision Making  Medically screening exam initiated at 1:26 PM.  Appropriate orders placed.  Thurman Sarver was informed that the remainder of the evaluation will be completed by another provider, this initial triage assessment does not replace that evaluation, and the importance of remaining in the ED until their evaluation is complete.     Bud Face, PA-C 01/03/21 1328    Lorelle Gibbs, DO 01/04/21 1531

## 2021-01-03 NOTE — ED Provider Notes (Addendum)
[facility] EMERGENCY DEPARTMENT Provider Note   CSN: [id number] Arrival date & time: 01/03/21  1315     History Chief Complaint  Patient presents with   Chest Pain    Juan Kline is a 50 y.o. male with PMHx HTN, HLD, T2DM, OSA, COPD, obesity who presents for evaluation of chest pain.  Patient reports a several month history of exertional dyspnea, which has been complicated over the past several weeks with exertional chest pain and palpitations.  Juan Kline notes that Juan Kline has had multiple episodes of exertional chest pain over the past 2 weeks.  Juan Kline states that the chest pain is located in the center of his chest, nonradiating, described as a heaviness, and reliably provoked with exertion.  Juan Kline was evaluated by his PCP for this complaint recently, who advised him to present to cardiology today; however, Juan Kline was unable to attend due to severity of symptoms earlier today.  Juan Kline subsequently presented to our emergency department for further evaluation.  HPI: A 50 year old patient with a history of treated diabetes, hypertension, hypercholesterolemia and obesity presents for evaluation of chest pain. Initial onset of pain was less than one hour ago. The patient's chest pain is described as heaviness/pressure/tightness and is worse with exertion. The patient's chest pain is middle- or left-sided, is not well-localized, is not sharp and does radiate to the arms/jaw/neck. The patient does not complain of nausea and denies diaphoresis. The patient has a family history of coronary artery disease in a first-degree relative with onset less than age 95. The patient has no history of stroke, has no history of peripheral artery disease and has not smoked in the past 90 days.   Past Medical History:  Diagnosis Date   Chronic foot ulcer ([place]) admitted 03/02/2014   medial border first metatarsal left foot  - resolved per patient 03/20/20   Diabetes ([place])    type 2 - no meds, diet controlled   Diabetic  foot infection ([place])    GAD (generalized anxiety disorder)    GERD (gastroesophageal reflux disease)    Hx of migraines    Hyperlipidemia    Hypertension Dx 2009   Mild depression ([facility])    OSA (obstructive sleep apnea)    CPAP   Osteomyelitis left first metatarsal 03/02/2014   Sleep apnea    not using CPAP- machine being looked at for repair 03/21/20    Venous stasis ulcer of left lower extremity ([place])    [name] 03/02/2014 - resolved per patient 03/20/20    Patient Active Problem List   Diagnosis Date Noted   Chest pain 01/03/2021   Dermatitis 05/15/2020   Hx of migraine headaches 01/24/2020   Lipomatosis of subcutaneous tissue 12/08/2019   Tinnitus of both ears 11/08/2019   Abnormal MRI of the head 08/24/2019   Acute superficial gastritis without hemorrhage 08/24/2019   COPD mixed type ([place]) 04/04/2019   Positive for macroalbuminuria 03/06/2019   Vapes nicotine containing substance 03/03/2019   Class 3 severe obesity due to excess calories without serious comorbidity with body mass index (BMI) of 45.0 to 49.9 in adult [facility]) 03/03/2019   Hyperlipidemia associated with type 2 diabetes mellitus ([place]) 03/03/2019   Type 2 diabetes mellitus with diabetic polyneuropathy, without long-term current use of insulin ([place]) 04/13/2018   Mild depression ([place]) 04/01/2018   OSA (obstructive sleep apnea) 10/26/2017   Anxiety disorder 10/26/2017   Peripheral neuropathy 12/12/2016   GAD (generalized anxiety disorder) 05/11/2015   Former smoker 05/11/2015   Other hyperlipidemia  05/22/2014   Vitamin D insufficiency 05/22/2014   Chronic intractable headache 05/19/2014   Numbness and tingling of foot 05/19/2014   Venous stasis dermatitis of both lower extremities 05/19/2014   history of Hypertension 03/02/2014   Morbid obesity ([place]) 03/02/2014    Past Surgical History:  Procedure Laterality Date   BIOPSY  08/18/2019   Procedure: BIOPSY;  Surgeon: Thornton Park, MD;  Location: [facility];   Service: Gastroenterology;;   ESOPHAGOGASTRODUODENOSCOPY (EGD) WITH PROPOFOL N/A 08/18/2019   Procedure: ESOPHAGOGASTRODUODENOSCOPY (EGD) WITH PROPOFOL;  Surgeon: Thornton Park, MD;  Location: [facility];  Service: Gastroenterology;  Laterality: N/A;   NO PAST SURGERIES         Family History  Problem Relation Age of Onset   Hypertension Mother    Hyperlipidemia Mother    Diabetes Mother    Kidney disease Mother    Hypertension Father    Hyperlipidemia Father    Diabetes Brother    Hypertension Brother    Hyperlipidemia Brother    Heart disease Maternal Grandmother    COPD Maternal Grandmother    Diabetes Maternal Grandfather    Heart attack Paternal Grandmother    Heart disease Paternal Grandfather    Colon cancer Neg Hx    Colon polyps Neg Hx    Stomach cancer Neg Hx    Esophageal cancer Neg Hx    Ulcerative colitis Neg Hx     Social History   Tobacco Use   Smoking status: Former    Packs/day: 1.50    Years: 26.00    Pack years: 39.00    Types: E-cigarettes, Cigarettes    Start date: 1989    Quit date: 02/16/2014    Years since quitting: 6.8   Smokeless tobacco: Never   Tobacco comments:    occasional vapes - no nicotine  Vaping Use   Vaping Use: Every day   Start date: 04/15/2015   Last attempt to quit: 04/11/2019   Substances: Nicotine  Substance Use Topics   Alcohol use: Not Currently    Comment: 03/02/2014 "I rarely drink"   Drug use: No    Home Medications Prior to Admission medications   Medication Sig Start Date End Date Taking? Authorizing Provider  albuterol (VENTOLIN HFA) 108 (90 Base) MCG/ACT inhaler Inhale 2 puffs into the lungs every 6 (six) hours as needed for wheezing or shortness of breath. 10/01/20   Ladell Pier, MD  amLODipine (NORVASC) 10 MG tablet Take 1 tablet (10 mg total) by mouth daily. 01/03/21   Ladell Pier, MD  atorvastatin (LIPITOR) 40 MG tablet Take 1.5 tablets (60 mg total) by mouth daily. 08/28/20   Ladell Pier, MD  clotrimazole-betamethasone (LOTRISONE) cream APPLY 1 APPLICATION TOPICALLY 2 (TWO) TIMES DAILY. 05/15/20 05/15/21  Elsie Stain, MD  losartan-hydrochlorothiazide (HYZAAR) 100-12.5 MG tablet TAKE 1 TABLET BY MOUTH DAILY. 11/26/20 11/26/21  Ladell Pier, MD  omeprazole (PRILOSEC) 20 MG capsule Take 20 mg by mouth as needed. Patient not taking: Reported on 12/28/2020    [provider]  sertraline (ZOLOFT) 100 MG tablet TAKE 1.5 TABLETS (150 MG TOTAL) BY MOUTH DAILY. 03/22/20 03/22/21  Pucilowski, Marchia Bond, MD  topiramate (TOPAMAX) 50 MG tablet Take 1 tablet (50 mg total) by mouth 2 (two) times daily. take one tablet by mouth every morning. then take two tablets by mouth in the evening. 12/28/20   Ladell Pier, MD    Allergies    Lyrica [pregabalin]  Review of Systems  Review of Systems  Constitutional:  Negative for chills and fever.  HENT:  Negative for ear pain and sore throat.   Eyes:  Negative for pain and visual disturbance.  Respiratory:  Negative for cough and shortness of breath.   Cardiovascular:  Positive for chest pain and palpitations.  Gastrointestinal:  Negative for abdominal pain and vomiting.  Genitourinary:  Negative for dysuria and hematuria.  Musculoskeletal:  Negative for arthralgias and back pain.  Skin:  Negative for color change and rash.  Neurological:  Negative for seizures and syncope.  All other systems reviewed and are negative.  Physical Exam Updated Vital Signs BP 112/67 (BP Location: Right Arm)   Pulse 67   Temp 98.9 F (37.2 C) (Oral)   Resp 18   SpO2 97%   Physical Exam Vitals and nursing note reviewed.  Constitutional:      Appearance: Juan Kline is well-developed.  HENT:     Head: Normocephalic and atraumatic.  Eyes:     Conjunctiva/sclera: Conjunctivae normal.  Cardiovascular:     Rate and Rhythm: Normal rate and regular rhythm.     Heart sounds: No murmur heard. Pulmonary:     Effort: Pulmonary effort is  normal. No respiratory distress.     Breath sounds: Normal breath sounds.     Comments: Lung clear to auscultation. No chest wall tenderness.  Abdominal:     Palpations: Abdomen is soft.     Tenderness: There is no abdominal tenderness.  Musculoskeletal:     Cervical back: Neck supple.  Skin:    General: Skin is warm and dry.     Capillary Refill: Capillary refill takes less than 2 seconds.  Neurological:     General: No focal deficit present.     Mental Status: Juan Kline is alert.    ED Results / Procedures / Treatments   Labs (all labs ordered are listed, but only abnormal results are displayed) Labs Reviewed  BASIC METABOLIC PANEL - Abnormal; Notable for the following components:      Result Value   Glucose, Bld 157 (*)    BUN 28 (*)    All other components within normal limits  CBC  TROPONIN I (HIGH SENSITIVITY)  TROPONIN I (HIGH SENSITIVITY)    EKG EKG Interpretation  Date/Time:  Thursday January 03 2021 13:12:04 EDT Ventricular Rate:  79 PR Interval:  270 QRS Duration: 104 QT Interval:  382 QTC Calculation: 438 R Axis:   -51 Text Interpretation: Sinus rhythm with 1st degree A-V block with Premature atrial complexes with Abberant conduction Low voltage QRS Left anterior fascicular block Abnormal ECG Confirmed by Lennice Sites ([id number]) on 01/03/2021 7:14:47 PM  Radiology DG Chest 2 View  Result Date: 01/03/2021 CLINICAL DATA:  Chest pain EXAM: CHEST - 2 VIEW COMPARISON:  08/13/2019 FINDINGS: The heart size and mediastinal contours are within normal limits. Atherosclerotic calcification of the aortic knob. Focal eventration of the anterior right hemidiaphragm, unchanged. Minimal bibasilar atelectasis. Lungs are otherwise clear. No pleural effusion or pneumothorax. The visualized skeletal structures are unremarkable. IMPRESSION: Minimal bibasilar atelectasis. Otherwise no acute cardiopulmonary findings. Electronically Signed   By: Davina Poke D.O.   On: 01/03/2021 14:32     Procedures Procedures   Medications Ordered in ED Medications - No data to display  ED Course  I have reviewed the triage vital signs and the nursing notes.  Pertinent labs & imaging results that were available during my care of the patient were reviewed by me and considered in my medical  decision making (see chart for details).    MDM Rules/Calculators/A&P HEAR Score: 5                         50 y.o. male with past medical history as above who presents for evaluation of chest pain. Afebrile and hemodynamically stable.  Exam as detailed above.  Initial differential includes (but is not limited to): ACS, PNA, PE, Aortic Dissection, Pancreatitis, Arrhythmia, Pneumothorax, Endo/Myo/Pericarditis, Shingles, Emergent complications of an Ulcer, Esophageal pathology.  The CXR assists in ruling out pneumonia, Pneumothorax, and Esophageal Tears.  No focal lung findings suggestive of pneumonia or pneumothorax. The patient does not appear to have a Pulmonary Embolism based no apparent asymmetric upper extremity or lower extremity edema/swelling suggestive of DVT and negative PERC rule.  Patient denies any fevers, positional change in chest pain with leaning forward or lying down and has no suggestive EKG changes, making both pericarditis and myocarditis less likely. There does not appear to be an Aortic Dissection either, based on physical exam, historically pain not abrupt in onset, tearing or ripping, pulses symmetric, and absence of acute focal neurologic deficit.  Musculoskeletal strain and costochondritis are also of consideration; however, pain is not reproducible with palpation.   The patient's risk factors for ACS were reviewed as well as the EKG. As reviewed above, EKG without evidence of ischemia or arrhythmia.  HEAR score is 6.  Serial troponins are negative but I have concern for unstable angina.  Patient will require hospital admission for further evaluation for ACS rule out and likely  stress testing.  Patient was discussed with hospitalist, who admit patient for further management.   Final Clinical Impression(s) / ED Diagnoses Final diagnoses:  Chest pain, unspecified type    Rx / DC Orders ED Discharge Orders     None        Violet Baldy, MD 01/03/21 2039    Lennice Sites, DO 01/03/21 Elliott, [place], DO 01/09/21 1497    Violet Baldy, MD 01/09/21 Edgar, [place], DO 01/10/21 2350

## 2021-01-03 NOTE — ED Triage Notes (Signed)
Patient BIB [facility] from home, compliant of CP x2 weeks and generally not feeling well, pt had appt with cardiology this afternoon but felt like he could not make it.

## 2021-01-04 ENCOUNTER — Inpatient Hospital Stay (HOSPITAL_BASED_OUTPATIENT_CLINIC_OR_DEPARTMENT_OTHER): Payer: No Typology Code available for payment source

## 2021-01-04 ENCOUNTER — Encounter (HOSPITAL_COMMUNITY): Payer: Self-pay | Admitting: Internal Medicine

## 2021-01-04 DIAGNOSIS — J449 Chronic obstructive pulmonary disease, unspecified: Secondary | ICD-10-CM

## 2021-01-04 DIAGNOSIS — R079 Chest pain, unspecified: Secondary | ICD-10-CM

## 2021-01-04 DIAGNOSIS — R0600 Dyspnea, unspecified: Secondary | ICD-10-CM

## 2021-01-04 DIAGNOSIS — G4733 Obstructive sleep apnea (adult) (pediatric): Secondary | ICD-10-CM

## 2021-01-04 DIAGNOSIS — E1142 Type 2 diabetes mellitus with diabetic polyneuropathy: Secondary | ICD-10-CM

## 2021-01-04 LAB — ECHOCARDIOGRAM COMPLETE
AR max vel: 1.72 cm2
AV Area VTI: 1.61 cm2
AV Area mean vel: 1.63 cm2
AV Mean grad: 4 mmHg
AV Peak grad: 7.8 mmHg
Ao pk vel: 1.4 m/s
S' Lateral: 2.8 cm

## 2021-01-04 LAB — CBC
HCT: 41.2 % (ref 39.0–52.0)
Hemoglobin: 13.8 g/dL (ref 13.0–17.0)
MCH: 29 pg (ref 26.0–34.0)
MCHC: 33.5 g/dL (ref 30.0–36.0)
MCV: 86.6 fL (ref 80.0–100.0)
Platelets: 267 10*3/uL (ref 150–400)
RBC: 4.76 MIL/uL (ref 4.22–5.81)
RDW: 13.2 % (ref 11.5–15.5)
WBC: 10.7 10*3/uL — ABNORMAL HIGH (ref 4.0–10.5)
nRBC: 0 % (ref 0.0–0.2)

## 2021-01-04 LAB — MAGNESIUM: Magnesium: 1.6 mg/dL — ABNORMAL LOW (ref 1.7–2.4)

## 2021-01-04 LAB — TSH: TSH: 0.944 u[IU]/mL (ref 0.350–4.500)

## 2021-01-04 LAB — COMPREHENSIVE METABOLIC PANEL
ALT: 22 U/L (ref 0–44)
AST: 21 U/L (ref 15–41)
Albumin: 3.5 g/dL (ref 3.5–5.0)
Alkaline Phosphatase: 54 U/L (ref 38–126)
Anion gap: 10 (ref 5–15)
BUN: 33 mg/dL — ABNORMAL HIGH (ref 6–20)
CO2: 25 mmol/L (ref 22–32)
Calcium: 9.3 mg/dL (ref 8.9–10.3)
Chloride: 103 mmol/L (ref 98–111)
Creatinine, Ser: 1.01 mg/dL (ref 0.61–1.24)
GFR, Estimated: >60 mL/min (ref >60)
Glucose, Bld: 124 mg/dL — ABNORMAL HIGH (ref 70–99)
Potassium: 3.4 mmol/L — ABNORMAL LOW (ref 3.5–5.1)
Sodium: 138 mmol/L (ref 135–145)
Total Bilirubin: 0.6 mg/dL (ref 0.3–1.2)
Total Protein: 7 g/dL (ref 6.5–8.1)

## 2021-01-04 LAB — GLUCOSE, CAPILLARY
Glucose-Capillary: 118 mg/dL — ABNORMAL HIGH (ref 70–99)
Glucose-Capillary: 132 mg/dL — ABNORMAL HIGH (ref 70–99)

## 2021-01-04 LAB — HIV ANTIBODY (ROUTINE TESTING W REFLEX): HIV Screen 4th Generation wRfx: NONREACTIVE

## 2021-01-04 LAB — BRAIN NATRIURETIC PEPTIDE: B Natriuretic Peptide: 14.8 pg/mL (ref 0.0–100.0)

## 2021-01-04 LAB — CBG MONITORING, ED: Glucose-Capillary: 138 mg/dL — ABNORMAL HIGH (ref 70–99)

## 2021-01-04 MED ORDER — PANTOPRAZOLE SODIUM 40 MG PO TBEC
40.0000 mg | DELAYED_RELEASE_TABLET | Freq: Every day | ORAL | Status: DC
  Administered 2021-01-04 – 2021-01-05 (×2): 40 mg via ORAL
  Filled 2021-01-04 (×2): qty 1

## 2021-01-04 MED ORDER — PERFLUTREN LIPID MICROSPHERE
1.0000 mL | INTRAVENOUS | Status: AC | PRN
  Administered 2021-01-04: 2 mL via INTRAVENOUS
  Filled 2021-01-04: qty 10

## 2021-01-04 MED ORDER — POTASSIUM CHLORIDE CRYS ER 20 MEQ PO TBCR
40.0000 meq | EXTENDED_RELEASE_TABLET | Freq: Once | ORAL | Status: AC
  Administered 2021-01-04: 40 meq via ORAL
  Filled 2021-01-04: qty 2

## 2021-01-04 MED ORDER — HYDROCHLOROTHIAZIDE 12.5 MG PO CAPS
12.5000 mg | ORAL_CAPSULE | Freq: Every day | ORAL | Status: DC
  Administered 2021-01-04: 12.5 mg via ORAL
  Filled 2021-01-04: qty 1

## 2021-01-04 NOTE — Progress Notes (Signed)
PROGRESS NOTE    Juan Kline  JSH:[id number] DOB: [birth year] DOA: 01/03/2021 PCP: Juan Pier, MD   Chief Complaint  Patient presents with   Chest Pain    Brief admission Narrative:  As per H&P written by Dr. Hal Kline on 01/03/2021 Juan Kline is a 50 y.o. male with history of COPD, sleep apnea, hypertension presents to the ER with complaints of chest pain.  Patient states he has been having chest pain off and on for the last 3 weeks.  Pain is retrosternal pressure-like nonradiating present even at rest.  Denies any associated shortness of breath palpitation diaphoresis abdominal pain nausea vomiting.  He had gone to his PCP and is planned to refer to cardiology but since patient's pain worsened he came to the ER.   ED Course: In the ER patient chest x-ray EKG and cardiac markers were unremarkable.  Patient at the time of my exam was chest pain-free.  Given the risk factors patient admitted for further work-up of chest pain.  COVID test negative.  Assessment & Plan: 1-Chest pain -Atypical, with some typical concerns and a heart score of 4. -Checking 2D echo -Continue PPI and statins -Telemetry monitoring -Follow cardiology recommendations.  2-history of Hypertension -Continue Norvasc and losartan -Holding HCTZ currently. -Advised to follow low-sodium diet and maintain adequate hydration.  3-morbid obesity -Body mass index is 45.25 kg/m.  -Low calorie diet, portion control and increase physical activity discussed with patient.  4-obstructive sleep apnea -Noncompliant with CPAP -Currently requiring oxygen supplementation when he will fall asleep to maintain saturation -Outpatient follow-up to further try different mask that we will accommodate him for better compliance. -Weight loss recommended  5-gastroesophageal reflux disease -Continue PPI  6-Type 2 diabetes mellitus with diabetic polyneuropathy, without long-term current use of insulin (HCC) -Continue sliding  scale insulin -Follow CBGs and adjust hypoglycemic regimen as required.  DVT prophylaxis: Lovenox Code Status: Full code Family Communication: No family at bedside Disposition:   Status is: Observation  The patient remains OBS appropriate and will d/c before 2 midnights.  Dispo: The patient is from: Home              Anticipated d/c is to: Home              Patient currently is not medically stable to d/c.   Difficult to place patient No       Consultants:  Cardiology service  Procedures:  2D echo: Pending See below for x-ray reports.  Antimicrobials:  None   Subjective: Afebrile, no chest pain, no nausea, no vomiting.  Currently chest pain-free.  Still reporting intermittent fluttering.  Objective: Vitals:   01/04/21 1215 01/04/21 1248 01/04/21 1300 01/04/21 1504  BP: 125/81  115/80 108/79  Pulse: 78  81 84  Resp: (!) 21  19 20   Temp:  98.7 F (37.1 C)  98.3 F (36.8 C)  TempSrc:  Oral  Oral  SpO2: 100%  98% 95%  Weight:    (!) 139 kg  Height:    5\' 9"  (1.753 m)   No intake or output data in the 24 hours ending 01/04/21 1613 Filed Weights   01/04/21 1504  Weight: (!) 139 kg    Examination:  General exam: Afebrile, currently chest pain-free; denies nausea, vomiting or abdominal pain.  Patient found to be hypoxic requiring 2 L nasal cannula supplementation. Respiratory system: Good saturation on room air. No crackles, no wheezing, no using accessory muscles Cardiovascular system: S1 & S2 heard, RRR.  Unable  to assess JVD with body habitus.  No rubs, no gallops.  Trace edema bilaterally. Gastrointestinal system: Abdomen is obese, nondistended, soft and nontender. No organomegaly or masses felt. Normal bowel sounds heard. Central nervous system: Alert and oriented. No focal neurological deficits. Extremities: No cyanosis or clubbing. Skin: No petechiae. Psychiatry: Judgement and insight appear normal. Mood & affect appropriate.     Data Reviewed: I  have personally reviewed following labs and imaging studies  CBC: Recent Labs  Lab 01/03/21 1332 01/04/21 0330  WBC 9.7 10.7*  HGB 14.8 13.8  HCT 44.2 41.2  MCV 85.5 86.6  PLT 297 850    Basic Metabolic Panel: Recent Labs  Lab 01/03/21 1332 01/04/21 0330 01/04/21 1425  NA 136 138  --   K 3.5 3.4*  --   CL 102 103  --   CO2 23 25  --   GLUCOSE 157* 124*  --   BUN 28* 33*  --   CREATININE 0.92 1.01  --   CALCIUM 9.4 9.3  --   MG  --   --  1.6*    GFR: Estimated Creatinine Clearance: 122.6 mL/min (by C-G formula based on SCr of 1.01 mg/dL).  Liver Function Tests: Recent Labs  Lab 01/04/21 0330  AST 21  ALT 22  ALKPHOS 54  BILITOT 0.6  PROT 7.0  ALBUMIN 3.5    CBG: Recent Labs  Lab 01/03/21 2153 01/04/21 1218  GLUCAP 121* 138*   Radiology Studies: DG Chest 2 View  Result Date: 01/03/2021 CLINICAL DATA:  Chest pain EXAM: CHEST - 2 VIEW COMPARISON:  08/13/2019 FINDINGS: The heart size and mediastinal contours are within normal limits. Atherosclerotic calcification of the aortic knob. Focal eventration of the anterior right hemidiaphragm, unchanged. Minimal bibasilar atelectasis. Lungs are otherwise clear. No pleural effusion or pneumothorax. The visualized skeletal structures are unremarkable. IMPRESSION: Minimal bibasilar atelectasis. Otherwise no acute cardiopulmonary findings. Electronically Signed   By: Juan Kline D.O.   On: 01/03/2021 14:32     Scheduled Meds:  amLODipine  10 mg Oral Daily   aspirin EC  81 mg Oral Daily   atorvastatin  60 mg Oral Daily   enoxaparin (LOVENOX) injection  40 mg Subcutaneous Q24H   insulin aspart  0-9 Units Subcutaneous TID WC   losartan  100 mg Oral Daily   pantoprazole  40 mg Oral Daily   potassium chloride  40 mEq Oral Once   sertraline  100 mg Oral Daily   topiramate  50 mg Oral BID   Continuous Infusions:   LOS: 0 days    Time spent: 30 minutes    Juan Dubois, MD [facility]   To  contact the attending provider between 7A-7P or the covering provider during after hours 7P-7A, please log into the web site [email or web address] and access using universal [place] password for that web site. If you do not have the password, please call the hospital operator.  01/04/2021, 4:13 PM

## 2021-01-04 NOTE — Consult Note (Addendum)
Cardiology Consultation:   Patient ID: Benton Tooker MRN: [id number]; DOB: 02-Jun-1970  Admit date: 01/03/2021 Date of Consult: 01/04/2021  PCP:  Ladell Pier, MD   [facility] Providers Cardiologist:  Pixie Casino, MD        Patient Profile:   Jacub Waiters is a 50 y.o. male with a hx of HTN, DM, venous stasis ulcers, GAD, migraines, GERD, HLD, mild depression, OSA nonadherent with BiPAP, COPD, obesity, aortic atherosclerosis by prior abdominal CT, former tobacco use (26 yrs), former daily ETOH abuse (now abstinent) who is being seen 01/04/2021 for the evaluation of chest pain at the request of Dr. Hal Hope.  History of Present Illness:   Mr. Kuehl previously followed with our office for dyspnea on exertion and atypical chest pain. Echo and exercise treadmill test in 2018 were unrevealing. Subsequent echo 01/2019 showed EF 60-65%, mildly increased LVH, pseudonormalization of diastolic filling, RV normal, no significant valvular disease. He has a family history of CAD in extended family members on his dad's side. He used to smoke but quit 7 yrs ago. He used to drink daily but no longer drinks. He has severe OSA but is intolerant of BiPAP - when he puts it on he feels like he can't breathe.  A few weeks ago he began to notice worsening of his chronic SOB with easy fatigability with exertion. He also feels randomly SOB sitting at times. Over the last week he has noticed an occasional pin prick of chest pain lasting a second in a specific area of his left upper chest wall occurring at random. No exertional angina. He has also noticed his heart racing sometimes all day long. He indicates he currently feels this actively happening (telemetry presently shows NSR with HR 90bpm). Telemetry otherwise shows NSR with rare PVC. He has not had any cough, recent illness, edema, syncope, weight gain or weight loss. Due to generally feeling unwell, he came to the ED for evaluation. Here, labs show  hsTroponin neg x 2, WBC 9.7-10.7, K 3.4, BUN slightly elevated to 33. His BP has been on the softer side which he states is unusual for him. CXR with minimal bibasilar atelectasis, otherwise no acute cardiopulmonary disease. He is noted to desaturate in the 70s-80s when sleeping, but otherwise has normal oxygenation while awake. He has not had any prolonged periods of bedrest, recent travel or surgery.  Past Medical History:  Diagnosis Date   Aortic atherosclerosis ([place])    Chronic foot ulcer ([place]) admitted 03/02/2014   medial border first metatarsal left foot  - resolved per patient 03/20/20   Diabetes ([place])    type 2 - no meds, diet controlled   Diabetic foot infection ([place])    First degree AV block    GAD (generalized anxiety disorder)    GERD (gastroesophageal reflux disease)    Hx of migraines    Hyperlipidemia    Hypertension Dx 2009   Mild depression ([place])    Osteomyelitis left first metatarsal 03/02/2014   Sleep apnea    not using CPAP- machine being looked at for repair 03/21/20    Venous stasis ulcer of left lower extremity ([place])    [name] 03/02/2014 - resolved per patient 03/20/20    Past Surgical History:  Procedure Laterality Date   BIOPSY  08/18/2019   Procedure: BIOPSY;  Surgeon: Thornton Park, MD;  Location: [facility];  Service: Gastroenterology;;   ESOPHAGOGASTRODUODENOSCOPY (EGD) WITH PROPOFOL N/A 08/18/2019   Procedure: ESOPHAGOGASTRODUODENOSCOPY (EGD) WITH PROPOFOL;  Surgeon: Thornton Park,  MD;  Location: [facility];  Service: Gastroenterology;  Laterality: N/A;     Home Medications:  Prior to Admission medications   Medication Sig Start Date End Date Taking? Authorizing Provider  albuterol (VENTOLIN HFA) 108 (90 Base) MCG/ACT inhaler Inhale 2 puffs into the lungs every 6 (six) hours as needed for wheezing or shortness of breath. 10/01/20  Yes Ladell Pier, MD  amLODipine (NORVASC) 10 MG tablet Take 1 tablet (10 mg total) by mouth daily. 01/03/21   Yes Ladell Pier, MD  atorvastatin (LIPITOR) 40 MG tablet Take 1.5 tablets (60 mg total) by mouth daily. 08/28/20  Yes Ladell Pier, MD  clotrimazole-betamethasone (LOTRISONE) cream APPLY 1 APPLICATION TOPICALLY 2 (TWO) TIMES DAILY. Patient taking differently: Apply 1 application topically 2 (two) times daily as needed (rash). 05/15/20 05/15/21 Yes Elsie Stain, MD  losartan-hydrochlorothiazide (HYZAAR) 100-12.5 MG tablet TAKE 1 TABLET BY MOUTH DAILY. 11/26/20 11/26/21 Yes Ladell Pier, MD  omeprazole (PRILOSEC) 20 MG capsule Take 20 mg by mouth daily as needed (indigestion).   Yes [provider]  sertraline (ZOLOFT) 100 MG tablet TAKE 1.5 TABLETS (150 MG TOTAL) BY MOUTH DAILY. Patient taking differently: Take 150 mg by mouth daily. 03/22/20 03/22/21 Yes Pucilowski, Olgierd A, MD  topiramate (TOPAMAX) 50 MG tablet Take 1 tablet (50 mg total) by mouth 2 (two) times daily. take one tablet by mouth every morning. then take two tablets by mouth in the evening. Patient taking differently: Take 100 mg by mouth 2 (two) times daily. 12/28/20  Yes Ladell Pier, MD    Inpatient Medications: Scheduled Meds:  amLODipine  10 mg Oral Daily   aspirin EC  81 mg Oral Daily   atorvastatin  60 mg Oral Daily   enoxaparin (LOVENOX) injection  40 mg Subcutaneous Q24H   hydrochlorothiazide  12.5 mg Oral Daily   insulin aspart  0-9 Units Subcutaneous TID WC   losartan  100 mg Oral Daily   pantoprazole  40 mg Oral Daily   potassium chloride  40 mEq Oral Once   sertraline  100 mg Oral Daily   topiramate  50 mg Oral BID   Continuous Infusions:  PRN Meds: acetaminophen **OR** acetaminophen, albuterol  Allergies:    Allergies  Allergen Reactions   Lyrica [Pregabalin] Anxiety    Social History:   Social History   Socioeconomic History   Marital status: Single    Spouse name: Not on file   Number of children: 0   Years of education: Not on file   Highest education level:  Not on file  Occupational History   Not on file  Tobacco Use   Smoking status: Former    Packs/day: 1.50    Years: 26.00    Pack years: 39.00    Types: E-cigarettes, Cigarettes    Start date: 1989    Quit date: 02/16/2014    Years since quitting: 6.8   Smokeless tobacco: Never   Tobacco comments:    occasional vapes - no nicotine  Vaping Use   Vaping Use: Every day   Start date: 04/15/2015   Last attempt to quit: 04/11/2019   Substances: Nicotine  Substance and Sexual Activity   Alcohol use: Not Currently    Comment: 03/02/2014 "I rarely drink"   Drug use: No   Sexual activity: Not Currently  Other Topics Concern   Not on file  Social History Narrative   Pt lives in 1 story home with his mother and step father  9th grade education   Last occupation was with [employer] in 2009      Unemployed at this time- thinking of filing for disability      Right handed      Social Determinants of Health   Financial Resource Strain: Not on file  Food Insecurity: Not on file  Transportation Needs: Not on file  Physical Activity: Not on file  Stress: Not on file  Social Connections: Not on file  Intimate Partner Violence: Not on file    Family History:    Family History  Problem Relation Age of Onset   Hypertension Mother    Hyperlipidemia Mother    Diabetes Mother    Kidney disease Mother    Hypertension Father    Hyperlipidemia Father    Diabetes Brother    Hypertension Brother    Hyperlipidemia Brother    Heart disease Maternal Grandmother    COPD Maternal Grandmother    Diabetes Maternal Grandfather    Heart attack Paternal Grandmother    Heart disease Paternal Grandfather    Colon cancer Neg Hx    Colon polyps Neg Hx    Stomach cancer Neg Hx    Esophageal cancer Neg Hx    Ulcerative colitis Neg Hx      ROS:  Please see the history of present illness.  All other ROS reviewed and negative.     Physical Exam/Data:   Vitals:   01/04/21 1100 01/04/21 1215  01/04/21 1248 01/04/21 1300  BP: (!) 122/91 125/81  115/80  Pulse: 81 78  81  Resp: (!) 29 (!) 21  19  Temp:   98.7 F (37.1 C)   TempSrc:   Oral   SpO2: 98% 100%  98%   No intake or output data in the 24 hours ending 01/04/21 1403 Last 3 Weights 12/28/2020 08/27/2020 05/15/2020  Weight (lbs) 311 lb 3.2 oz 309 lb 6.4 oz 319 lb  Weight (kg) 141.159 kg 140.343 kg 144.697 kg     There is no height or weight on file to calculate BMI.  General: Well developed, well nourished obese WM in no acute distress. Head: Normocephalic, atraumatic, sclera non-icteric, no xanthomas, nares are without discharge. Neck: Negative for carotid bruits. JVP not elevated. Lungs: Clear bilaterally to auscultation without wheezes, rales, or rhonchi. Breathing is unlabored. Heart: RRR S1 S2 without murmurs, rubs, or gallops.  Abdomen: Soft, non-tender, non-distended with normoactive bowel sounds. No rebound/guarding. Extremities: No clubbing or cyanosis. No edema. Distal pedal pulses are 2+ and equal bilaterally. Neuro: Alert and oriented X 3. Moves all extremities spontaneously. Psych:  Responds to questions appropriately with a normal affect.  EKG:  The EKG was personally reviewed and demonstrates:   NSR 79bpm 1st degree AVB with one PVC, left anterior fascicular block, low voltage QRS  Telemetry:  Telemetry was personally reviewed and demonstrates:  NSR occasional PVC  Relevant CV Studies: 2D Echo 01/2019  1. Left ventricular ejection fraction, by visual estimation, is 60 to  65%. The left ventricle has normal function. Normal left ventricular size.  There is mildly increased left ventricular hypertrophy. Normal wall  motion.   2. Left ventricular diastolic Doppler parameters are consistent with  pseudonormalization pattern of LV diastolic filling.   3. Global right ventricle has normal systolic function.The right  ventricular size is normal. No increase in right ventricular wall  thickness.   4. Left  atrial size was normal.   5. Right atrial size was normal.   6. Moderate  mitral annular calcification.   7. The mitral valve is normal in structure. No evidence of mitral valve  regurgitation. No evidence of mitral stenosis.   8. The tricuspid valve is normal in structure. Tricuspid valve  regurgitation was not visualized by color flow Doppler.   9. The aortic valve is tricuspid Aortic valve regurgitation was not  visualized by color flow Doppler. Structurally normal aortic valve, with  no evidence of sclerosis or stenosis.  10. The inferior vena cava is normal in size with greater than 50%  respiratory variability, suggesting right atrial pressure of 3 mmHg.  11. TR signal is inadequate for assessing pulmonary artery systolic  pressure.   Laboratory Data:  High Sensitivity Troponin:   Recent Labs  Lab 01/03/21 1332 01/03/21 1800  TROPONINIHS 6 8     Chemistry Recent Labs  Lab 01/03/21 1332 01/04/21 0330  NA 136 138  K 3.5 3.4*  CL 102 103  CO2 23 25  GLUCOSE 157* 124*  BUN 28* 33*  CREATININE 0.92 1.01  CALCIUM 9.4 9.3  GFRNONAA >60 >60  ANIONGAP 11 10    Recent Labs  Lab 01/04/21 0330  PROT 7.0  ALBUMIN 3.5  AST 21  ALT 22  ALKPHOS 54  BILITOT 0.6   Lipids No results for input(s): CHOL, TRIG, HDL, LABVLDL, LDLCALC, CHOLHDL in the last 168 hours.  Hematology Recent Labs  Lab 01/03/21 1332 01/04/21 0330  WBC 9.7 10.7*  RBC 5.17 4.76  HGB 14.8 13.8  HCT 44.2 41.2  MCV 85.5 86.6  MCH 28.6 29.0  MCHC 33.5 33.5  RDW 13.2 13.2  PLT 297 267   Thyroid No results for input(s): TSH, FREET4 in the last 168 hours.  BNPNo results for input(s): BNP, PROBNP in the last 168 hours.  DDimer No results for input(s): DDIMER in the last 168 hours.   Radiology/Studies:  DG Chest 2 View  Result Date: 01/03/2021 CLINICAL DATA:  Chest pain EXAM: CHEST - 2 VIEW COMPARISON:  08/13/2019 FINDINGS: The heart size and mediastinal contours are within normal limits.  Atherosclerotic calcification of the aortic knob. Focal eventration of the anterior right hemidiaphragm, unchanged. Minimal bibasilar atelectasis. Lungs are otherwise clear. No pleural effusion or pneumothorax. The visualized skeletal structures are unremarkable. IMPRESSION: Minimal bibasilar atelectasis. Otherwise no acute cardiopulmonary findings. Electronically Signed   By: Davina Poke D.O.   On: 01/03/2021 14:32     Assessment and Plan:   1. Worsening DOE, atypical chest pain - troponins neg x2, arguing against ACS, and chest pain does not sound anginal in quality - no pleuritic CP and no signs of VTE on exam - dyspnea on exertion warrants further workup - from cardiac standpoint we will start with echocardiogram to assess LV function and also right heart pressures/presence of pulmonary HTN given untreated OSA - we will use this information to guide next steps - check BNP with labs, otherwise does not appear overtly volume overloaded  2. Essential HTN, now with hypotension - stop HCTZ and follow, may need to consider switching amlodipine to a beta blocker to address subjective palpitations  3. Palpitations - not clearly related to arrhythmia as he feels this while actively in NSR - ? Contribution of dehydration - BUN is up and BP has been soft, so will hold HCTZ - check TSH  4. Hypokalemia - will replete with 68meq KCl now and hold HCTZ  5. Aortic atherosclerosis noted on prior CT - check lipids in AM and consider statin titration to  goal LDL <70   6. DM - last A1C 6.6 in 08/2020 - per primary team  7. OSA - consider OP consultation to discuss alternative therapies / bariatric management  Risk Assessment/Risk Scores:     HEAR Score (for undifferentiated chest pain):  HEAR Score: 6    For questions or updates, please contact [place] Please consult [email or web address] for contact info under    Signed, Charlie Pitter, PA-C  01/04/2021 2:03 PM

## 2021-01-04 NOTE — ED Notes (Signed)
Pt O2 sats waxing and waning while sleeping, dropping to upper 70's. Pt reports he is supposed to wear a CPAP at home, but doesn't. 2L O2 via [place] placed on pt.

## 2021-01-05 DIAGNOSIS — I10 Essential (primary) hypertension: Secondary | ICD-10-CM

## 2021-01-05 DIAGNOSIS — R072 Precordial pain: Secondary | ICD-10-CM

## 2021-01-05 LAB — BASIC METABOLIC PANEL
Anion gap: 8 (ref 5–15)
BUN: 27 mg/dL — ABNORMAL HIGH (ref 6–20)
CO2: 25 mmol/L (ref 22–32)
Calcium: 9 mg/dL (ref 8.9–10.3)
Chloride: 102 mmol/L (ref 98–111)
Creatinine, Ser: 1.01 mg/dL (ref 0.61–1.24)
GFR, Estimated: >60 mL/min (ref >60)
Glucose, Bld: 117 mg/dL — ABNORMAL HIGH (ref 70–99)
Potassium: 3.4 mmol/L — ABNORMAL LOW (ref 3.5–5.1)
Sodium: 135 mmol/L (ref 135–145)

## 2021-01-05 LAB — CBC
HCT: 41.1 % (ref 39.0–52.0)
Hemoglobin: 13.7 g/dL (ref 13.0–17.0)
MCH: 28.5 pg (ref 26.0–34.0)
MCHC: 33.3 g/dL (ref 30.0–36.0)
MCV: 85.4 fL (ref 80.0–100.0)
Platelets: 239 10*3/uL (ref 150–400)
RBC: 4.81 MIL/uL (ref 4.22–5.81)
RDW: 13.1 % (ref 11.5–15.5)
WBC: 8.1 10*3/uL (ref 4.0–10.5)
nRBC: 0 % (ref 0.0–0.2)

## 2021-01-05 LAB — LIPID PANEL
Cholesterol: 151 mg/dL (ref 0–200)
HDL: 31 mg/dL — ABNORMAL LOW (ref >40)
LDL Cholesterol: 60 mg/dL (ref 0–99)
Total CHOL/HDL Ratio: 4.9 RATIO
Triglycerides: 300 mg/dL — ABNORMAL HIGH (ref <150)
VLDL: 60 mg/dL — ABNORMAL HIGH (ref 0–40)

## 2021-01-05 LAB — GLUCOSE, CAPILLARY: Glucose-Capillary: 130 mg/dL — ABNORMAL HIGH (ref 70–99)

## 2021-01-05 LAB — MAGNESIUM: Magnesium: 1.7 mg/dL (ref 1.7–2.4)

## 2021-01-05 MED ORDER — ASPIRIN 81 MG PO TBEC
81.0000 mg | DELAYED_RELEASE_TABLET | Freq: Every day | ORAL | 11 refills | Status: AC
  Filled 2021-01-05: qty 30, 30d supply, fill #0

## 2021-01-05 NOTE — Discharge Summary (Signed)
Physician Discharge Summary  Lawernce Earll OAC:[id number] DOB: 07/06/1970 DOA: 01/03/2021  PCP: Ladell Pier, MD  Admit date: 01/03/2021 Discharge date: 01/05/2021  Admitted From: Home Disposition: Home  Recommendations for Outpatient Follow-up:  Follow up with PCP in 1-2 weeks Cardiology will schedule follow-up  Home Health: Not applicable Equipment/Devices: Not applicable  Discharge Condition: Stable CODE STATUS: Full code Diet recommendation: Low-salt diet, low-carb diet  Discharge summary:  50 year old gentleman with history of COPD, obstructive sleep apnea not compliant to CPAP and hypertension presented with chest discomfort and occasional palpitation.  Admitted to the hospital with cardiology consultation.  Chest pain.  Atypical.  Acute coronary syndrome ruled out.  EKG and troponins, follow-up testing nonischemic.  2D echocardiogram essentially normal.  Seen by cardiology.  Recommended no further ischemia work-up.  Continue PPI that he uses for GERD.  Aspirin was added for cardioprotection.  Intermittent chest fluttering.  Found to have intermittent PVCs and telemetry.  Symptomatic only when exertion.  Probably related to untreated sleep apnea.  He will start using CPAP.  Hypertension: Blood pressure stable on Norvasc and losartan.  Resume all home medications.  Sleep apnea not using CPAP: Inconvenient mask so he is not using it.  Recommended to discuss with his providers, DME supplies for different kind of supplies that he can tolerate.  Stable to discharge.  Cardiology cleared.   Discharge Diagnoses:  Principal Problem:   Chest pain Active Problems:   history of Hypertension   OSA (obstructive sleep apnea)   Type 2 diabetes mellitus with diabetic polyneuropathy, without long-term current use of insulin (HCC)   Hyperlipidemia associated with type 2 diabetes mellitus ([place])   COPD mixed type [facility])    Discharge Instructions  Discharge Instructions     Call MD  for:  difficulty breathing, headache or visual disturbances   Complete by: As directed    Diet - low sodium heart healthy   Complete by: As directed    Discharge instructions   Complete by: As directed    Try to wear your CPAP as much possible  or talk to supplier for different options   Increase activity slowly   Complete by: As directed       Allergies as of 01/05/2021       Reactions   Lyrica [pregabalin] Anxiety        Medication List     TAKE these medications    albuterol 108 (90 Base) MCG/ACT inhaler Commonly known as: VENTOLIN HFA Inhale 2 puffs into the lungs every 6 (six) hours as needed for wheezing or shortness of breath.   amLODipine 10 MG tablet Commonly known as: NORVASC Take 1 tablet (10 mg total) by mouth daily.   aspirin 81 MG EC tablet Take 1 tablet (81 mg total) by mouth daily. Swallow whole.   atorvastatin 40 MG tablet Commonly known as: LIPITOR Take 1.5 tablets (60 mg total) by mouth daily.   clotrimazole-betamethasone cream Commonly known as: LOTRISONE APPLY 1 APPLICATION TOPICALLY 2 (TWO) TIMES DAILY. What changed:  how much to take when to take this reasons to take this   losartan-hydrochlorothiazide 100-12.5 MG tablet Commonly known as: HYZAAR TAKE 1 TABLET BY MOUTH DAILY.   omeprazole 20 MG capsule Commonly known as: PRILOSEC Take 20 mg by mouth daily as needed (indigestion).   sertraline 100 MG tablet Commonly known as: ZOLOFT TAKE 1.5 TABLETS (150 MG TOTAL) BY MOUTH DAILY. What changed:  how much to take how to take this when to take this  topiramate 50 MG tablet Commonly known as: Topamax Take 1 tablet (50 mg total) by mouth 2 (two) times daily. take one tablet by mouth every morning. then take two tablets by mouth in the evening. What changed:  how much to take additional instructions        Follow-up Information     Ladell Pier, MD Follow up in 2 week(s).   Specialty: Internal Medicine Contact  information: [place] [address] [phone]         Pixie Casino, MD .   Specialty: Cardiology Contact information: [place] [address] [phone]                Allergies  Allergen Reactions   Lyrica [Pregabalin] Anxiety    Consultations: Cardiology   Procedures/Studies: DG Chest 2 View  Result Date: 01/03/2021 CLINICAL DATA:  Chest pain EXAM: CHEST - 2 VIEW COMPARISON:  08/13/2019 FINDINGS: The heart size and mediastinal contours are within normal limits. Atherosclerotic calcification of the aortic knob. Focal eventration of the anterior right hemidiaphragm, unchanged. Minimal bibasilar atelectasis. Lungs are otherwise clear. No pleural effusion or pneumothorax. The visualized skeletal structures are unremarkable. IMPRESSION: Minimal bibasilar atelectasis. Otherwise no acute cardiopulmonary findings. Electronically Signed   By: Davina Poke D.O.   On: 01/03/2021 14:32   ECHOCARDIOGRAM COMPLETE  Result Date: 01/04/2021    ECHOCARDIOGRAM REPORT   Patient Name:   Juan Kline Date of Exam: 01/04/2021 Medical Rec #:  [id number]  Height:       69.0 in Accession #:    [id number] Weight:       311.2 lb Date of Birth:  Feb 20, 1971  BSA:          2.493 m Patient Age:    50 years   BP:           108/79 mmHg Patient Gender: M          HR:           85 bpm. Exam Location:  Inpatient Procedure: 2D Echo, Cardiac Doppler, Color Doppler and Intracardiac            Opacification Agent Indications:    Chest Pain  History:        Patient has prior history of Echocardiogram examinations, most                 recent 02/04/2019. OSA on CPAP, Signs/Symptoms:Shortness of                 Breath and Chest Pain; Risk Factors:Morbid obesity.  Sonographer:    Merrie Roof RDCS Referring Phys: [place]  1. Left ventricular ejection fraction, by estimation, is 60 to 65%. The left ventricle has normal function. The left ventricle  has no regional wall motion abnormalities. There is mild concentric left ventricular hypertrophy. Indeterminate diastolic filling due to E-A fusion.  2. Right ventricular systolic function is low normal. The right ventricular size is mildly enlarged. Tricuspid regurgitation signal is inadequate for assessing PA pressure.  3. Left atrial size was mildly dilated.  4. Right atrial size was mildly dilated.  5. The mitral valve is grossly normal. No evidence of mitral valve regurgitation. No evidence of mitral stenosis. Moderate mitral annular calcification.  6. The aortic valve was not well visualized. Aortic valve regurgitation is not visualized. No aortic stenosis is present.  7. The inferior vena cava is normal in size with greater than 50% respiratory  variability, suggesting right atrial pressure of 3 mmHg. Comparison(s): Prior images reviewed side by side. Conclusion(s)/Recommendation(s): Otherwise normal echocardiogram, with minor abnormalities described in the report. FINDINGS  Left Ventricle: Left ventricular ejection fraction, by estimation, is 60 to 65%. The left ventricle has normal function. The left ventricle has no regional wall motion abnormalities. Definity contrast agent was given IV to delineate the left ventricular  endocardial borders. The left ventricular internal cavity size was normal in size. There is mild concentric left ventricular hypertrophy. Indeterminate diastolic filling due to E-A fusion. Right Ventricle: The right ventricular size is mildly enlarged. Right vetricular wall thickness was not well visualized. Right ventricular systolic function is low normal. Tricuspid regurgitation signal is inadequate for assessing PA pressure. Left Atrium: Left atrial size was mildly dilated. Right Atrium: Right atrial size was mildly dilated. Pericardium: Trivial pericardial effusion is present. Presence of pericardial fat pad. Mitral Valve: The mitral valve is grossly normal. Moderate mitral annular  calcification. No evidence of mitral valve regurgitation. No evidence of mitral valve stenosis. Tricuspid Valve: The tricuspid valve is grossly normal. Tricuspid valve regurgitation is not demonstrated. No evidence of tricuspid stenosis. Aortic Valve: The aortic valve was not well visualized. Aortic valve regurgitation is not visualized. No aortic stenosis is present. Aortic valve mean gradient measures 4.0 mmHg. Aortic valve peak gradient measures 7.8 mmHg. Aortic valve area, by VTI measures 1.61 cm. Pulmonic Valve: The pulmonic valve was not well visualized. Pulmonic valve regurgitation is not visualized. No evidence of pulmonic stenosis. Aorta: The aortic root and ascending aorta are structurally normal, with no evidence of dilitation. Venous: The inferior vena cava is normal in size with greater than 50% respiratory variability, suggesting right atrial pressure of 3 mmHg. IAS/Shunts: The atrial septum is grossly normal.  LEFT VENTRICLE PLAX 2D LVIDd:         4.50 cm LVIDs:         2.80 cm LV PW:         1.30 cm LV IVS:        1.30 cm LVOT diam:     2.00 cm LV SV:         36 LV SV Index:   15 LVOT Area:     3.14 cm  RIGHT VENTRICLE RV Basal diam:  4.10 cm RV Mid diam:    4.00 cm LEFT ATRIUM           Index       RIGHT ATRIUM           Index LA diam:      4.60 cm 1.85 cm/m  RA Area:     25.00 cm LA Vol (A4C): 63.8 ml 25.59 ml/m RA Volume:   81.00 ml  32.49 ml/m  AORTIC VALVE AV Area (Vmax):    1.72 cm AV Area (Vmean):   1.63 cm AV Area (VTI):     1.61 cm AV Vmax:           140.00 cm/s AV Vmean:          94.000 cm/s AV VTI:            0.227 m AV Peak Grad:      7.8 mmHg AV Mean Grad:      4.0 mmHg LVOT Vmax:         76.80 cm/s LVOT Vmean:        48.700 cm/s LVOT VTI:          0.116 m LVOT/AV VTI ratio: 0.51  AORTA Ao Root diam: 3.00 cm Ao Asc diam:  2.80 cm  SHUNTS Systemic VTI:  0.12 m Systemic Diam: 2.00 cm Buford Dresser MD Electronically signed by Buford Dresser MD Signature Date/Time:  01/04/2021/5:51:20 PM    Final    (Echo, Carotid, EGD, Colonoscopy, ERCP)    Subjective: Patient seen and examined in the morning rounds.  He was without any symptoms.  No chest pain.  Denies any chest discomfort since admission.  He tells me he had 1 episode of palpitation last night that corresponds with some PVCs on the monitor.  Cardiology determined these are benign PVCs and no need to treat.   Discharge Exam: Vitals:   01/05/21 0006 01/05/21 0427  BP: 104/77 107/62  Pulse: 74 65  Resp: 16 16  Temp: 97.8 F (36.6 C) (!) 97.4 F (36.3 C)  SpO2: 94% 100%   Vitals:   01/04/21 1504 01/04/21 1944 01/05/21 0006 01/05/21 0427  BP: 108/79 111/76 104/77 107/62  Pulse: 84 90 74 65  Resp: 20 16 16 16   Temp: 98.3 F (36.8 C) 98.4 F (36.9 C) 97.8 F (36.6 C) (!) 97.4 F (36.3 C)  TempSrc: Oral Oral Oral Oral  SpO2: 95% 96% 94% 100%  Weight: (!) 139 kg     Height: 5\' 9"  (1.753 m)       General: Pt is alert, awake, not in acute distress Cardiovascular: RRR, S1/S2 +, no rubs, no gallops Respiratory: CTA bilaterally, no wheezing, no rhonchi Abdominal: Soft, NT, ND, bowel sounds + Extremities: no edema, no cyanosis    The results of significant diagnostics from this hospitalization (including imaging, microbiology, ancillary and laboratory) are listed below for reference.     Microbiology: No results found for this or any previous visit (from the past 240 hour(s)).   Labs: BNP (last 3 results) Recent Labs    01/04/21 1425  BNP 53.9   Basic Metabolic Panel: Recent Labs  Lab 01/03/21 1332 01/04/21 0330 01/04/21 1425 01/05/21 0459  NA 136 138  --  135  K 3.5 3.4*  --  3.4*  CL 102 103  --  102  CO2 23 25  --  25  GLUCOSE 157* 124*  --  117*  BUN 28* 33*  --  27*  CREATININE 0.92 1.01  --  1.01  CALCIUM 9.4 9.3  --  9.0  MG  --   --  1.6* 1.7   Liver Function Tests: Recent Labs  Lab 01/04/21 0330  AST 21  ALT 22  ALKPHOS 54  BILITOT 0.6  PROT 7.0   ALBUMIN 3.5   No results for input(s): LIPASE, AMYLASE in the last 168 hours. No results for input(s): AMMONIA in the last 168 hours. CBC: Recent Labs  Lab 01/03/21 1332 01/04/21 0330 01/05/21 0459  WBC 9.7 10.7* 8.1  HGB 14.8 13.8 13.7  HCT 44.2 41.2 41.1  MCV 85.5 86.6 85.4  PLT 297 267 239   Cardiac Enzymes: No results for input(s): CKTOTAL, CKMB, CKMBINDEX, TROPONINI in the last 168 hours. BNP: Invalid input(s): POCBNP CBG: Recent Labs  Lab 01/03/21 2153 01/04/21 1218 01/04/21 1638 01/04/21 2142 01/05/21 0605  GLUCAP 121* 138* 118* 132* 130*   D-Dimer No results for input(s): DDIMER in the last 72 hours. Hgb A1c No results for input(s): HGBA1C in the last 72 hours. Lipid Profile Recent Labs    01/05/21 0459  CHOL 151  HDL 31*  LDLCALC 60  TRIG 300*  CHOLHDL 4.9   Thyroid function  studies Recent Labs    01/04/21 1425  TSH 0.944   Anemia work up No results for input(s): VITAMINB12, FOLATE, FERRITIN, TIBC, IRON, RETICCTPCT in the last 72 hours. Urinalysis    Component Value Date/Time   COLORURINE YELLOW 08/15/2019 [place] 08/15/2019 1755   LABSPEC 1.025 08/15/2019 1755   PHURINE 6.0 08/15/2019 1755   GLUCOSEU NEGATIVE 08/15/2019 1755   HGBUR TRACE (A) 08/15/2019 1755   [place] 08/15/2019 1755   BILIRUBINUR negative 05/26/2019 1116   [place] 08/15/2019 1755   PROTEINUR 100 (A) 08/15/2019 1755   UROBILINOGEN 0.2 05/26/2019 1116   NITRITE NEGATIVE 08/15/2019 1755   LEUKOCYTESUR NEGATIVE 08/15/2019 1755   Sepsis Labs Invalid input(s): PROCALCITONIN,  WBC,  LACTICIDVEN Microbiology No results found for this or any previous visit (from the past 240 hour(s)).   Time coordinating discharge:  28 minutes  SIGNED:   Barb Merino, MD  [facility] 01/05/2021, 4:56 PM

## 2021-01-05 NOTE — Progress Notes (Signed)
Patient ID: Juan Kline, male   DOB: Dec 09, 1970, 50 y.o.   MRN: [id number]   Primary Cardiologist :  [place]  Subjective:   " Hanging in there"  no chest pain   Objective:  Vitals:   01/04/21 1504 01/04/21 1944 01/05/21 0006 01/05/21 0427  BP: 108/79 111/76 104/77 107/62  Pulse: 84 90 74 65  Resp: 20 16 16 16   Temp: 98.3 F (36.8 C) 98.4 F (36.9 C) 97.8 F (36.6 C) (!) 97.4 F (36.3 C)  TempSrc: Oral Oral Oral Oral  SpO2: 95% 96% 94% 100%  Weight: (!) 139 kg     Height: 5\' 9"  (1.753 m)       Intake/Output from previous day:  Intake/Output Summary (Last 24 hours) at 01/05/2021 5681 Last data filed at 01/04/2021 1707 Gross per 24 hour  Intake 240 ml  Output --  Net 240 ml    Physical Exam: Affect appropriate Overweight white male  HEENT: normal Neck supple with no adenopathy JVP normal no bruits no thyromegaly Lungs clear with no wheezing and good diaphragmatic motion Heart:  S1/S2 no murmur, no rub, gallop or click PMI normal Abdomen: benighn, BS positve, no tenderness, no AAA no bruit.  No HSM or HJR Distal pulses intact with no bruits No edema Neuro non-focal Skin warm and dry No muscular weakness   Lab Results: Basic Metabolic Panel: Recent Labs    01/04/21 0330 01/04/21 1425 01/05/21 0459  NA 138  --  135  K 3.4*  --  3.4*  CL 103  --  102  CO2 25  --  25  GLUCOSE 124*  --  117*  BUN 33*  --  27*  CREATININE 1.01  --  1.01  CALCIUM 9.3  --  9.0  MG  --  1.6* 1.7   Liver Function Tests: Recent Labs    01/04/21 0330  AST 21  ALT 22  ALKPHOS 54  BILITOT 0.6  PROT 7.0  ALBUMIN 3.5   No results for input(s): LIPASE, AMYLASE in the last 72 hours. CBC: Recent Labs    01/04/21 0330 01/05/21 0459  WBC 10.7* 8.1  HGB 13.8 13.7  HCT 41.2 41.1  MCV 86.6 85.4  PLT 267 239    Fasting Lipid Panel: Recent Labs    01/05/21 0459  CHOL 151  HDL 31*  LDLCALC 60  TRIG 300*  CHOLHDL 4.9   Thyroid Function Tests: Recent Labs     01/04/21 1425  TSH 0.944     Imaging: DG Chest 2 View  Result Date: 01/03/2021 CLINICAL DATA:  Chest pain EXAM: CHEST - 2 VIEW COMPARISON:  08/13/2019 FINDINGS: The heart size and mediastinal contours are within normal limits. Atherosclerotic calcification of the aortic knob. Focal eventration of the anterior right hemidiaphragm, unchanged. Minimal bibasilar atelectasis. Lungs are otherwise clear. No pleural effusion or pneumothorax. The visualized skeletal structures are unremarkable. IMPRESSION: Minimal bibasilar atelectasis. Otherwise no acute cardiopulmonary findings. Electronically Signed   By: Davina Poke D.O.   On: 01/03/2021 14:32   ECHOCARDIOGRAM COMPLETE  Result Date: 01/04/2021    ECHOCARDIOGRAM REPORT   Patient Name:   Juan Kline Date of Exam: 01/04/2021 Medical Rec #:  [id number]  Height:       69.0 in Accession #:    [id number] Weight:       311.2 lb Date of Birth:  [birth year]  BSA:          2.493 m Patient Age:    83 years  BP:           108/79 mmHg Patient Gender: M          HR:           85 bpm. Exam Location:  Inpatient Procedure: 2D Echo, Cardiac Doppler, Color Doppler and Intracardiac            Opacification Agent Indications:    Chest Pain  History:        Patient has prior history of Echocardiogram examinations, most                 recent 02/04/2019. OSA on CPAP, Signs/Symptoms:Shortness of                 Breath and Chest Pain; Risk Factors:Morbid obesity.  Sonographer:    Merrie Roof RDCS Referring Phys: [place]  1. Left ventricular ejection fraction, by estimation, is 60 to 65%. The left ventricle has normal function. The left ventricle has no regional wall motion abnormalities. There is mild concentric left ventricular hypertrophy. Indeterminate diastolic filling due to E-A fusion.  2. Right ventricular systolic function is low normal. The right ventricular size is mildly enlarged. Tricuspid regurgitation signal is inadequate for assessing PA  pressure.  3. Left atrial size was mildly dilated.  4. Right atrial size was mildly dilated.  5. The mitral valve is grossly normal. No evidence of mitral valve regurgitation. No evidence of mitral stenosis. Moderate mitral annular calcification.  6. The aortic valve was not well visualized. Aortic valve regurgitation is not visualized. No aortic stenosis is present.  7. The inferior vena cava is normal in size with greater than 50% respiratory variability, suggesting right atrial pressure of 3 mmHg. Comparison(s): Prior images reviewed side by side. Conclusion(s)/Recommendation(s): Otherwise normal echocardiogram, with minor abnormalities described in the report. FINDINGS  Left Ventricle: Left ventricular ejection fraction, by estimation, is 60 to 65%. The left ventricle has normal function. The left ventricle has no regional wall motion abnormalities. Definity contrast agent was given IV to delineate the left ventricular  endocardial borders. The left ventricular internal cavity size was normal in size. There is mild concentric left ventricular hypertrophy. Indeterminate diastolic filling due to E-A fusion. Right Ventricle: The right ventricular size is mildly enlarged. Right vetricular wall thickness was not well visualized. Right ventricular systolic function is low normal. Tricuspid regurgitation signal is inadequate for assessing PA pressure. Left Atrium: Left atrial size was mildly dilated. Right Atrium: Right atrial size was mildly dilated. Pericardium: Trivial pericardial effusion is present. Presence of pericardial fat pad. Mitral Valve: The mitral valve is grossly normal. Moderate mitral annular calcification. No evidence of mitral valve regurgitation. No evidence of mitral valve stenosis. Tricuspid Valve: The tricuspid valve is grossly normal. Tricuspid valve regurgitation is not demonstrated. No evidence of tricuspid stenosis. Aortic Valve: The aortic valve was not well visualized. Aortic valve  regurgitation is not visualized. No aortic stenosis is present. Aortic valve mean gradient measures 4.0 mmHg. Aortic valve peak gradient measures 7.8 mmHg. Aortic valve area, by VTI measures 1.61 cm. Pulmonic Valve: The pulmonic valve was not well visualized. Pulmonic valve regurgitation is not visualized. No evidence of pulmonic stenosis. Aorta: The aortic root and ascending aorta are structurally normal, with no evidence of dilitation. Venous: The inferior vena cava is normal in size with greater than 50% respiratory variability, suggesting right atrial pressure of 3 mmHg. IAS/Shunts: The atrial septum is grossly normal.  LEFT VENTRICLE PLAX 2D LVIDd:  4.50 cm LVIDs:         2.80 cm LV PW:         1.30 cm LV IVS:        1.30 cm LVOT diam:     2.00 cm LV SV:         36 LV SV Index:   15 LVOT Area:     3.14 cm  RIGHT VENTRICLE RV Basal diam:  4.10 cm RV Mid diam:    4.00 cm LEFT ATRIUM           Index       RIGHT ATRIUM           Index LA diam:      4.60 cm 1.85 cm/m  RA Area:     25.00 cm LA Vol (A4C): 63.8 ml 25.59 ml/m RA Volume:   81.00 ml  32.49 ml/m  AORTIC VALVE AV Area (Vmax):    1.72 cm AV Area (Vmean):   1.63 cm AV Area (VTI):     1.61 cm AV Vmax:           140.00 cm/s AV Vmean:          94.000 cm/s AV VTI:            0.227 m AV Peak Grad:      7.8 mmHg AV Mean Grad:      4.0 mmHg LVOT Vmax:         76.80 cm/s LVOT Vmean:        48.700 cm/s LVOT VTI:          0.116 m LVOT/AV VTI ratio: 0.51  AORTA Ao Root diam: 3.00 cm Ao Asc diam:  2.80 cm  SHUNTS Systemic VTI:  0.12 m Systemic Diam: 2.00 cm Buford Dresser MD Electronically signed by Buford Dresser MD Signature Date/Time: 01/04/2021/5:51:20 PM    Final     Cardiac Studies:  ECG: SR aberrant PAC no acute ST changes    Telemetry:  NSR   Echo: 01/05/21 Normal EF 60-65% RV low normal no significant valve dx  Medications:    amLODipine  10 mg Oral Daily   aspirin EC  81 mg Oral Daily   atorvastatin  60 mg Oral Daily    enoxaparin (LOVENOX) injection  40 mg Subcutaneous Q24H   insulin aspart  0-9 Units Subcutaneous TID WC   losartan  100 mg Oral Daily   pantoprazole  40 mg Oral Daily   sertraline  100 mg Oral Daily   topiramate  50 mg Oral BID      Assessment/Plan:   Chest Pain:  atypical R/O negative troponin no acute ECG changes TTE with normal EF no RWMAls See Dr Debara Pickett note no need for inpatient stress testing HTN:  diuretic d/c stable  Palpitations:  minimal PAC noted can consider outpatient monitor  DM:  Discussed low carb diet.  Target hemoglobin A1c is 6.5 or less.  Continue current medications. OSA:  not compliant CPA consider outpatient ENT evaluation Inspire device   Ok to d/c home from cardiology perspective Will arrange outpatient f/u Dr Drucie Ip [facility] 01/05/2021, 9:27 AM

## 2021-01-06 ENCOUNTER — Other Ambulatory Visit: Payer: Self-pay | Admitting: Internal Medicine

## 2021-01-06 MED ORDER — LOSARTAN POTASSIUM 100 MG PO TABS
100.0000 mg | ORAL_TABLET | Freq: Every day | ORAL | 1 refills | Status: DC
  Filled 2021-01-06: qty 30, 30d supply, fill #0
  Filled 2021-01-31: qty 30, 30d supply, fill #1
  Filled 2021-03-01: qty 30, 30d supply, fill #2
  Filled 2021-03-30: qty 30, 30d supply, fill #3
  Filled 2021-05-01 (×2): qty 30, 30d supply, fill #0
  Filled 2021-06-04 (×2): qty 30, 30d supply, fill #1

## 2021-01-07 ENCOUNTER — Telehealth: Payer: Self-pay

## 2021-01-07 ENCOUNTER — Other Ambulatory Visit: Payer: Self-pay

## 2021-01-07 ENCOUNTER — Telehealth: Payer: Self-pay | Admitting: Internal Medicine

## 2021-01-07 NOTE — Telephone Encounter (Signed)
TOC scheduled for 01/14/21 at 11:15am with Fabian Sharp per Dr. Johnsie Cancel

## 2021-01-07 NOTE — Telephone Encounter (Signed)
Transition Care Management Unsuccessful Follow-up Telephone Call  Date of discharge and from where:  01/05/2021, [facility]   Attempts:  1st Attempt  Reason for unsuccessful TCM follow-up call:  Left voice message on # [phone]

## 2021-01-07 NOTE — Telephone Encounter (Signed)
Transition Care Management Follow-up Telephone Call   Date of discharge and from where:[facility] on 01/05/2021 How have you been since you were released from the hospital? Feeling good right now Any questions or concerns? No questions/concerns reported.  Items Reviewed: Did the pt receive and understand the discharge instructions provided? have the instructions and have no questions.  Medications obtained and verified? He said that the have the medication list  and the hospital staff reviewed them in detail prior to discharge. He said that he has all of the medications and they have no questions.  Any new allergies since your discharge? None reported  Do you have support at home? Yes, did not specify whom, stated people Other (ie: DME, Home Health, etc)     NA   Functional Questionnaire: (I = Independent and D = Dependent) ADL's:  Independent.        Follow up appointments reviewed:   PCP Hospital f/u appt confirmed? Dr [name] on 01/21/2021@ 1050.  [facility] f/u appt confirmed? Cardiology scheduled at this time  Are transportation arrangements needed? have transportation   If their condition worsens, is the pt aware to call  their PCP or go to the ED? Yes.Made pt aware if condition worsen or start experiencing rapid weight gain, chest pain, diff breathing, SOB, high fevers, or bleading to refer imediately to ED for further evaluation.  Was the patient provided with contact information for the PCP's office or ED? He has the phone number  Was the pt encouraged to call back with questions or concerns?yes

## 2021-01-07 NOTE — Telephone Encounter (Signed)
Patient contacted regarding discharge from [facility] on 01/05/21.  Patient understands to follow up with provider Doreene Adas PA on 01/14/21 at 11:15am at [facility]. Patient understands discharge instructions? YES Patient understands medications and regiment? YES Patient understands to bring all medications to this visit? YES

## 2021-01-07 NOTE — Telephone Encounter (Signed)
Patient called in for Juan Kline can be reached anytime at  Ph# [phone]

## 2021-01-08 ENCOUNTER — Telehealth: Payer: Self-pay | Admitting: Clinical

## 2021-01-08 ENCOUNTER — Other Ambulatory Visit: Payer: Self-pay

## 2021-01-08 NOTE — Telephone Encounter (Signed)
I spoke with this pt per PCP request and provided pt with [facility] information and walk-in hours.

## 2021-01-09 ENCOUNTER — Telehealth: Payer: Self-pay | Admitting: Internal Medicine

## 2021-01-09 NOTE — Telephone Encounter (Signed)
Copied from [place] [id number]. Topic: General - Other >> Jan 07, 2021 12:32 PM Yvette Rack wrote: Reason for CRM: Pt returned call to [facility]. Pt requests call back.

## 2021-01-09 NOTE — Progress Notes (Signed)
Cardiology Office Note:    Date:  01/14/2021   ID:  Juan Kline, DOB [birth year], MRN [id number]  PCP:  Juan Pier, MD  Cardiologist:  Juan Casino, MD   Referring MD: Juan Pier, MD   Chief Complaint  Patient presents with   Hospitalization Follow-up    palpitations    History of Present Illness:    Juan Kline is a 49 y.o. male with a hx of HTN, HLD, OSA, COPD partial compliance with BiPAP, DM2, peripheral neuropathy, obesity, venous stasis ulcers, aortic atherosclerosis by CT, former alcohol abuse now abstinent, and former smoker. He has a history of nonischemic ETT in 2018. Echocardiogram in Oct 2020 with LVEF 60-65%, mildly increased LVH, grade 2 DD, and no significant valvular disease.   He was recently hospitalized 12/2020 with DOE and fatigue along with atypical CP. He ruled out with negative troponins. EKG was nonischemic. Echo with normal LVEF and no RWMA. He also reported heart racing. Telemetry was unrevealing aside from rare PVCs.   Pt received a TOC call.   He presents today for hospital follow up appt. He is maintained on 100 mg losartan, 10 mg amlodipine, 81 mg ASA, and 40 mg lipitor. ASA was added for cardioprotection. Overall, he feels unwell and has had increased fluttering in his chest. When this happens, he also has lightheadedness and had chest pressure with breathlessness. Fluttering was intermittent throughout the rest of the day yesterday.  Prior to his admission, he was not using his CPAP. Since discharge, he has been using his CPAP and felt a little better.     Past Medical History:  Diagnosis Date   Aortic atherosclerosis ([place])    Chronic foot ulcer ([place]) admitted 03/02/2014   medial border first metatarsal left foot  - resolved per patient 03/20/20   Diabetes ([place])    type 2 - no meds, diet controlled   Diabetic foot infection ([place])    First degree AV block    GAD (generalized anxiety disorder)    GERD (gastroesophageal reflux disease)     Hx of migraines    Hyperlipidemia    Hypertension Dx 2009   Mild depression    Osteomyelitis left first metatarsal 03/02/2014   Sleep apnea    not using CPAP- machine being looked at for repair 03/21/20    Venous stasis ulcer of left lower extremity ([place])    Juan Kline 03/02/2014 - resolved per patient 03/20/20    Past Surgical History:  Procedure Laterality Date   BIOPSY  08/18/2019   Procedure: BIOPSY;  Surgeon: Thornton Park, MD;  Location: [facility];  Service: Gastroenterology;;   ESOPHAGOGASTRODUODENOSCOPY (EGD) WITH PROPOFOL N/A 08/18/2019   Procedure: ESOPHAGOGASTRODUODENOSCOPY (EGD) WITH PROPOFOL;  Surgeon: Thornton Park, MD;  Location: [facility];  Service: Gastroenterology;  Laterality: N/A;    Current Medications: Current Meds  Medication Sig   amLODipine (NORVASC) 10 MG tablet Take 1 tablet (10 mg total) by mouth daily.   aspirin 81 MG EC tablet Take 1 tablet (81 mg total) by mouth daily. Swallow whole.   atorvastatin (LIPITOR) 40 MG tablet Take 1.5 tablets (60 mg total) by mouth daily.   clotrimazole-betamethasone (LOTRISONE) cream APPLY 1 APPLICATION TOPICALLY 2 (TWO) TIMES DAILY. (Patient taking differently: Apply 1 application topically 2 (two) times daily as needed (rash).)   losartan (COZAAR) 100 MG tablet Take 1 tablet (100 mg total) by mouth daily.   Magnesium Oxide 400 MG CAPS Take 1 capsule (400 mg total) by mouth daily.  omeprazole (PRILOSEC) 20 MG capsule Take 20 mg by mouth daily as needed (indigestion).   sertraline (ZOLOFT) 100 MG tablet TAKE 1.5 TABLETS (150 MG TOTAL) BY MOUTH DAILY.     Allergies:   Lyrica [pregabalin]   Social History   Socioeconomic History   Marital status: Single    Spouse name: Not on file   Number of children: 0   Years of education: Not on file   Highest education level: Not on file  Occupational History   Not on file  Tobacco Use   Smoking status: Former    Packs/day: 1.50    Years: 26.00    Pack years:  39.00    Types: E-cigarettes, Cigarettes    Start date: 1989    Quit date: 02/16/2014    Years since quitting: 6.9   Smokeless tobacco: Never   Tobacco comments:    occasional vapes - no nicotine  Vaping Use   Vaping Use: Every day   Start date: 04/15/2015   Last attempt to quit: 04/11/2019   Substances: Nicotine  Substance and Sexual Activity   Alcohol use: Not Currently    Comment: 03/02/2014 "I rarely drink"   Drug use: No   Sexual activity: Not Currently  Other Topics Concern   Not on file  Social History Narrative   Pt lives in 1 story home with his mother and step father   9th grade education   Last occupation was with [employer] in 2009      Unemployed at this time- thinking of filing for disability      Right handed      Social Determinants of Health   Financial Resource Strain: Not on file  Food Insecurity: Not on file  Transportation Needs: Not on file  Physical Activity: Not on file  Stress: Not on file  Social Connections: Not on file     Family History: The patient's family history includes COPD in his maternal grandmother; Diabetes in his brother, maternal grandfather, and mother; Heart attack in his paternal grandmother; Heart disease in his maternal grandmother and paternal grandfather; Hyperlipidemia in his brother, father, and mother; Hypertension in his brother, father, and mother; Kidney disease in his mother. There is no history of Colon cancer, Colon polyps, Stomach cancer, Esophageal cancer, or Ulcerative colitis.  ROS:   Please see the history of present illness.     All other systems reviewed and are negative.  EKGs/Labs/Other Studies Reviewed:    The following studies were reviewed today:  Echo 01/04/21: 1. Left ventricular ejection fraction, by estimation, is 60 to 65%. The  left ventricle has normal function. The left ventricle has no regional  wall motion abnormalities. There is mild concentric left ventricular  hypertrophy. Indeterminate  diastolic  filling due to E-A fusion.   2. Right ventricular systolic function is low normal. The right  ventricular size is mildly enlarged. Tricuspid regurgitation signal is  inadequate for assessing PA pressure.   3. Left atrial size was mildly dilated.   4. Right atrial size was mildly dilated.   5. The mitral valve is grossly normal. No evidence of mitral valve  regurgitation. No evidence of mitral stenosis. Moderate mitral annular  calcification.   6. The aortic valve was not well visualized. Aortic valve regurgitation  is not visualized. No aortic stenosis is present.   7. The inferior vena cava is normal in size with greater than 50%  respiratory variability, suggesting right atrial pressure of 3 mmHg.  EKG:  EKG  is  ordered today.  The ekg ordered today demonstrates sinus rhythm with HR 81, first degree heart block, incomplete bundle branch block, left axis deviation, PVC  Recent Labs: 01/04/2021: ALT 22; B Natriuretic Peptide 14.8; TSH 0.944 01/05/2021: BUN 27; Creatinine, Ser 1.01; Hemoglobin 13.7; Magnesium 1.7; Platelets 239; Potassium 3.4; Sodium 135  Recent Lipid Panel    Component Value Date/Time   CHOL 151 01/05/2021 0459   CHOL 183 08/27/2020 1436   TRIG 300 (H) 01/05/2021 0459   HDL 31 (L) 01/05/2021 0459   HDL 46 08/27/2020 1436   CHOLHDL 4.9 01/05/2021 0459   VLDL 60 (H) 01/05/2021 0459   LDLCALC 60 01/05/2021 0459   LDLCALC 93 08/27/2020 1436    Physical Exam:    VS:  BP 126/82   Pulse 96   Ht 5\' 9"  (1.753 m)   Wt (!) 313 lb 3.2 oz (142.1 kg)   SpO2 95%   BMI 46.25 kg/m     Wt Readings from Last 3 Encounters:  01/14/21 (!) 313 lb 3.2 oz (142.1 kg)  01/04/21 (!) 306 lb 7 oz (139 kg)  12/28/20 (!) 311 lb 3.2 oz (141.2 kg)     GEN: obese male in NAD HEENT: Normal NECK: No JVD; No carotid bruits LYMPHATICS: No lymphadenopathy CARDIAC: RRR, no murmurs, rubs, gallops RESPIRATORY:  Clear to auscultation without rales, wheezing or rhonchi  ABDOMEN:  Soft, non-tender, non-distended MUSCULOSKELETAL:  No edema; No deformity  SKIN: Warm and dry NEUROLOGIC:  Alert and oriented x 3 PSYCHIATRIC:  Normal affect   ASSESSMENT:    1. Palpitations   2. Atypical chest pain   3. Essential hypertension   4. Hyperlipidemia, unspecified hyperlipidemia type   5. Class 3 severe obesity due to excess calories without serious comorbidity with body mass index (BMI) of 45.0 to 49.9 in adult ([place])   6. OSA (obstructive sleep apnea)   7. COPD mixed type ([place])   8. Type 2 diabetes mellitus with diabetic polyneuropathy, without long-term current use of insulin (HCC)    PLAN:    In order of problems listed above:  Palpitations - Plan: EKG 12-Lead, LONG TERM MONITOR (3-14 DAYS)  Atypical chest pain - Plan: EKG 12-Lead  Essential hypertension - Plan: EKG 12-Lead  Hyperlipidemia, unspecified hyperlipidemia type  Class 3 severe obesity due to excess calories without serious comorbidity with body mass index (BMI) of 45.0 to 49.9 in adult (HCC)  OSA (obstructive sleep apnea)  COPD mixed type (HCC)  Type 2 diabetes mellitus with diabetic polyneuropathy, without long-term current use of insulin (HCC)  Palpitations - likely complicated by untreated OSA - EKG today with sinus rhythm - will place a 14 day zio patch - TSH WNL during hospitalization - Mg was 1.7 - will also add 400 mg [place] daily  Atypical chest pain - ruled out during recent hospitalization  Hypertension - maintained on losartan and amlodipine - BP well-controlled  Hyperlipidemia 01/05/2021: Cholesterol 151; HDL 31; LDL Cholesterol 60; Triglycerides 300; VLDL 60 Continue 40 mg lipitor.   DM2 A1c 6.6%  COPD OSA not compliant on BiPAP - will start using BiPAP machine  - followed by pulmonology   Follow up with me in 5-6 weeks with monitor results.   Medication Adjustments/Labs and Tests Ordered: Current medicines are reviewed at length with the patient today.  Concerns  regarding medicines are outlined above.  Orders Placed This Encounter  Procedures   LONG TERM MONITOR (3-14 DAYS)   EKG 12-Lead  Meds ordered this encounter  Medications   Magnesium Oxide 400 MG CAPS    Sig: Take 1 capsule (400 mg total) by mouth daily.    Refill:  0     Signed, Ledora Bottcher, [place]  01/14/2021 12:09 PM    [facility]

## 2021-01-09 NOTE — Telephone Encounter (Signed)
Call returned to patient.  This message was received today but patient called in 2 days ago,  He spoke to Bonnita Nasuti, RN since then and did not have any questions/concerns at this time

## 2021-01-11 ENCOUNTER — Telehealth: Payer: Self-pay | Admitting: Internal Medicine

## 2021-01-11 ENCOUNTER — Other Ambulatory Visit: Payer: Self-pay

## 2021-01-11 NOTE — Telephone Encounter (Signed)
-----   Message from Ruffin Pyo, [place] sent at 01/08/2021  6:06 PM EDT ----- I spoke with this pt and provided information on [place] ----- Message ----- From: Ladell Pier, MD Sent: 12/28/2020   1:11 PM EDT To: Ruffin Pyo, LCSW  I referred this patient to [facility] behavioral health.  However he has not received a call or an appointment as yet.  Can you please facilitate getting him an appointment for depression/anxiety medication management?  He would also benefit from some CBT.

## 2021-01-14 ENCOUNTER — Encounter: Payer: Self-pay | Admitting: Physician Assistant

## 2021-01-14 ENCOUNTER — Ambulatory Visit (INDEPENDENT_AMBULATORY_CARE_PROVIDER_SITE_OTHER): Payer: No Typology Code available for payment source

## 2021-01-14 ENCOUNTER — Ambulatory Visit (INDEPENDENT_AMBULATORY_CARE_PROVIDER_SITE_OTHER): Payer: No Typology Code available for payment source | Admitting: Physician Assistant

## 2021-01-14 ENCOUNTER — Other Ambulatory Visit: Payer: Self-pay

## 2021-01-14 VITALS — BP 126/82 | HR 96 | Ht 69.0 in | Wt 313.2 lb

## 2021-01-14 DIAGNOSIS — R002 Palpitations: Secondary | ICD-10-CM

## 2021-01-14 DIAGNOSIS — G4733 Obstructive sleep apnea (adult) (pediatric): Secondary | ICD-10-CM

## 2021-01-14 DIAGNOSIS — I10 Essential (primary) hypertension: Secondary | ICD-10-CM

## 2021-01-14 DIAGNOSIS — E785 Hyperlipidemia, unspecified: Secondary | ICD-10-CM

## 2021-01-14 DIAGNOSIS — E1142 Type 2 diabetes mellitus with diabetic polyneuropathy: Secondary | ICD-10-CM

## 2021-01-14 DIAGNOSIS — J449 Chronic obstructive pulmonary disease, unspecified: Secondary | ICD-10-CM

## 2021-01-14 DIAGNOSIS — R0789 Other chest pain: Secondary | ICD-10-CM

## 2021-01-14 DIAGNOSIS — Z6841 Body Mass Index (BMI) 40.0 and over, adult: Secondary | ICD-10-CM

## 2021-01-14 MED ORDER — MAGNESIUM OXIDE 400 MG PO CAPS: 400.0000 mg | ORAL_CAPSULE | Freq: Every day | ORAL | 0 refills | Status: DC

## 2021-01-14 MED ORDER — MAGNESIUM OXIDE 400 MG PO CAPS
400.0000 mg | ORAL_CAPSULE | Freq: Every day | ORAL | 3 refills | Status: DC
  Filled 2021-01-14: qty 90, 90d supply, fill #0

## 2021-01-14 NOTE — Progress Notes (Unsigned)
Patient enrolled for [employer] to mail a 14 day ZIO XT monitor to his home.  Dr. Debara Pickett to read.

## 2021-01-14 NOTE — Patient Instructions (Addendum)
Medication Instructions:  You may take 400 mg Magnesium Oxide (Over the Counter) daily  *If you need a refill on your cardiac medications before your next appointment, please call your pharmacy*  Lab Work: NONE ordered at this time of appointment   If you have labs (blood work) drawn today and your tests are completely normal, you will receive your results only by: [place] (if you have MyChart) OR A paper copy in the mail If you have any lab test that is abnormal or we need to change your treatment, we will call you to review the results.  Testing/Procedures:  Bryn Gulling- Long Term Monitor Instructions  Your physician has requested you wear a ZIO patch monitor for 14 days.  This is a single patch monitor. [employer] supplies one patch monitor per enrollment. Additional stickers are not available. Please do not apply patch if you will be having a Nuclear Stress Test,  Echocardiogram, Cardiac CT, MRI, or Chest Xray during the period you would be wearing the  monitor. The patch cannot be worn during these tests. You cannot remove and re-apply the  ZIO XT patch monitor.  Your ZIO patch monitor will be mailed 3 day USPS to your address on file. It may take 3-5 days  to receive your monitor after you have been enrolled.  Once you have received your monitor, please review the enclosed instructions. Your monitor  has already been registered assigning a specific monitor serial # to you.  Billing and Patient Assistance Program Information  We have supplied [employer] with any of your insurance information on file for billing purposes. [employer] offers a sliding scale Patient Assistance Program for patients that do not have  insurance, or whose insurance does not completely cover the cost of the ZIO monitor.  You must apply for the Patient Assistance Program to qualify for this discounted rate.  To apply, please call [employer] at [phone], select option 4, select option 2, ask to apply for   Patient Assistance Program. Theodore Demark will ask your household income, and how many people  are in your household. They will quote your out-of-pocket cost based on that information.  [employer] will also be able to set up a 39-month, interest-free payment plan if needed.  Applying the monitor   Shave hair from upper left chest.  Hold abrader disc by orange tab. Rub abrader in 40 strokes over the upper left chest as  indicated in your monitor instructions.  Clean area with 4 enclosed alcohol pads. Let dry.  Apply patch as indicated in monitor instructions. Patch will be placed under collarbone on left  side of chest with arrow pointing upward.  Rub patch adhesive wings for 2 minutes. Remove white label marked "1". Remove the white  label marked "2". Rub patch adhesive wings for 2 additional minutes.  While looking in a mirror, press and release button in center of patch. A small green light will  flash 3-4 times. This will be your only indicator that the monitor has been turned on.  Do not shower for the first 24 hours. You may shower after the first 24 hours.  Press the button if you feel a symptom. You will hear a small click. Record Date, Time and  Symptom in the Patient Logbook.  When you are ready to remove the patch, follow instructions on the last 2 pages of Patient  Logbook. Stick patch monitor onto the last page of Patient Logbook.  Place Patient Logbook in the blue and white box.  Use locking tab on box and tape box closed  securely. The blue and white box has prepaid postage on it. Please place it in the mailbox as  soon as possible. Your physician should have your test results approximately 7 days after the  monitor has been mailed back to [facility].  Call [place] at [phone] if you have questions regarding  your ZIO XT patch monitor. Call them immediately if you see an orange light blinking on your  monitor.  If your monitor falls off in less than 4  days, contact our Monitor department at [phone].  If your monitor becomes loose or falls off after 4 days call [employer] at [phone] for  suggestions on securing your monitor  Follow-Up: At [facility], you and your health needs are our priority.  As part of our continuing mission to provide you with exceptional heart care, we have created designated Provider Care Teams.  These Care Teams include your primary Cardiologist (physician) and Advanced Practice Providers (APPs -  Physician Assistants and Nurse Practitioners) who all work together to provide you with the care you need, when you need it.  Your next appointment:   5-6 week(s)  The format for your next appointment:   In Person  Provider:   Fabian Sharp, PA-C  Other Instructions

## 2021-01-17 DIAGNOSIS — R002 Palpitations: Secondary | ICD-10-CM

## 2021-01-21 ENCOUNTER — Ambulatory Visit (HOSPITAL_BASED_OUTPATIENT_CLINIC_OR_DEPARTMENT_OTHER): Payer: No Typology Code available for payment source | Attending: Internal Medicine | Admitting: Internal Medicine

## 2021-01-21 ENCOUNTER — Ambulatory Visit: Payer: No Typology Code available for payment source | Admitting: Internal Medicine

## 2021-01-21 ENCOUNTER — Encounter: Payer: Self-pay | Admitting: Internal Medicine

## 2021-01-21 DIAGNOSIS — G4733 Obstructive sleep apnea (adult) (pediatric): Secondary | ICD-10-CM

## 2021-01-21 NOTE — Telephone Encounter (Signed)
Called and spoke with patient regarding his MyChart message. Patient states that he has had some palpitations with fluttering on and off with occasional chest pains. Patient states that the pains are quick and do not last very long and come and go. Patient denies any chest pain at present. Patient reports that he feels the pain associated with the palpitations, and patient is currently wearing a heart monitor to assess. Patient also states that he has had some slight lightheadedness and shortness of breath with exertion and when these palpitations occur. Patient states that he may not be well hydrated, advised patient to maintain adequate hydration and avoid caffeine. Patient would like to know what he should do regarding these issues. Made patient aware of ED precautions should new or worsening symptoms develop. Patient verbalized understanding.   Will forward to Providers for review and advice.

## 2021-01-23 NOTE — Telephone Encounter (Signed)
Message sent to patient in another encounter

## 2021-01-24 ENCOUNTER — Telehealth: Payer: Self-pay

## 2021-01-24 ENCOUNTER — Other Ambulatory Visit: Payer: Self-pay

## 2021-01-24 NOTE — Telephone Encounter (Signed)
Message received from Dr [name] requesting this CM contact patient about his BiPAP machine. She noted that he had received his machine through [employer] with assistance from [facility] and he would like the settings changed back to his original settings because he is having difficulty tolerating the current pressures.  Per Epic, the patient received his BiPAP machine through the [employer] - CPAP assistance program on 02/04/2018. it appears that [facility], even though they did not provide the machine,  changed the settings in 05/2020 after receiving an order from the pulmonologist.   We need to ask him if he has contacted [employer] or [facility]  about changing the settings and if they are able to do that, what is the cost?  He is uninsured at present time. If they are not able to change settings, we can check with Dr Joya Gaskins to see if he can change the settings. The patient has also contacted [facility] about this issue.  Call placed to patient, message left with call back requested to this CM or [facility].

## 2021-01-25 NOTE — Telephone Encounter (Signed)
Pt is calling back to speak to Opal Sidles or Eyvonne Mechanic regarding this message CB- [phone]

## 2021-01-26 ENCOUNTER — Encounter: Payer: Self-pay | Admitting: Internal Medicine

## 2021-01-26 DIAGNOSIS — H9313 Tinnitus, bilateral: Secondary | ICD-10-CM

## 2021-01-28 ENCOUNTER — Telehealth: Payer: Self-pay

## 2021-01-28 NOTE — Telephone Encounter (Signed)
Call placed to [facility], spoke with Chastity and confirmed that [employer] changed the BiPAP settings 05/31/2020. She said that the settings could be changed again with an order.  She left a message for CPAP team inquiring what the cost would be for the setting change as patient is uninsured. Call back requested to this CM.  Call placed to patient and informed him of above information. Explained to him that this CM will call him with an update when [place] calls this CM back

## 2021-01-29 ENCOUNTER — Other Ambulatory Visit: Payer: Self-pay

## 2021-01-29 ENCOUNTER — Ambulatory Visit (INDEPENDENT_AMBULATORY_CARE_PROVIDER_SITE_OTHER): Payer: No Typology Code available for payment source | Admitting: Internal Medicine

## 2021-01-29 ENCOUNTER — Encounter: Payer: Self-pay | Admitting: Internal Medicine

## 2021-01-29 VITALS — BP 124/81 | HR 88 | Ht 69.0 in | Wt 309.6 lb

## 2021-01-29 DIAGNOSIS — R0789 Other chest pain: Secondary | ICD-10-CM

## 2021-01-29 DIAGNOSIS — R002 Palpitations: Secondary | ICD-10-CM

## 2021-01-29 DIAGNOSIS — G4733 Obstructive sleep apnea (adult) (pediatric): Secondary | ICD-10-CM

## 2021-01-29 NOTE — Progress Notes (Signed)
OFFICE NOTE  Chief Complaint:  Follow-up  Primary Care Physician: Juan Pier, MD  HPI:  Juan Kline is a 50 y.o. male with a past medial history significant for hypertension, venous stasis ulcers and osteomyelitis of the first left metatarsal, who recently was in the emergency department with chest pain and uncontrolled hypertension. He said he knew he had hypertension for more than 10 years. He's not been on any medications. He said he had a sleep study number of years ago but was not diagnosed with OSA, but did not follow-up with the study. He was seen 2 other times in the emergency department the summer for chest pain and hypertension as well. Recently saw Juan Ransom, PA-C, who ordered an exercise tolerance test which was negative. The patient exercised for 6 minutes on the Bruce protocol to max predicted heart rate of 86%. Normal blood pressure response and negative for ischemia. He also had an echocardiogram which demonstrated an EF of 55-60%, normal wall motion and grade 1 diastolic dysfunction with mild left atrial enlargement. He reports she's had no further chest pain symptoms since then.  03/14/2019  Juan Kline is seen today in follow-up.  Overall he feels well.  His blood pressure is now much better controlled at 126/87.  He is in fact had several visits demonstrating good blood pressure control.  He was seen recently by Juan Lofts, PA-C, who noted that he had some shortness of breath.  An echo was ordered which showed mild diastolic dysfunction and normal systolic function.  He is scheduled for some PFTs coming up next week.  I suspect weight is a big factor in his shortness of breath.  05/08/2020  Juan Kline is seen today in follow-up.  When I last saw him in 2020 he underwent PFTs which showed some moderate obstruction and diffusion defect.  He is also vaping it was felt this was related to that.  He tried a couple inhalers without much benefit.  He was also diagnosed with  severe obstructive sleep apnea.  Unfortunately he has been unable to tolerate BiPAP.  Has been working with pulmonary on that.  He saw his PCP for virtual visit this past week.  He has repeat labs which are going to be drawn today to reassess cholesterol and other factors.  He denies any exertional chest pain.  Unfortunately he has not been able to lose much weight.  He says he does not have a lot of energy to exercise and feels that it is all related to his sleep apnea.  01/29/2021  Juan Kline returns today for follow-up.  I saw him in the hospital recently in September.  He ruled out for chest pain which is thought to be atypical.  Repeat echo was reassuring with no regional wall motion abnormalities and normal LVEF.  He been having some palpitations.  He was noted to have some PACs.  A monitor was placed and he is nearing the end of a 2week stent on the monitor.  Hopefully he does not have any atrial fibrillation which is my concern because he does have significant sleep apnea.  He has been on BiPAP since his hospitalization and recently made some adjustments on his settings on his own as he was not able to get a hold of the sleep doctor who apparently had recently decreased the settings.  PMHx:  Past Medical History:  Diagnosis Date   Aortic atherosclerosis (Juan Kline)    Chronic foot ulcer ([place]) admitted 03/02/2014  medial border first metatarsal left foot  - resolved per patient 03/20/20   Diabetes ([place])    type 2 - no meds, diet controlled   Diabetic foot infection ([place])    First degree AV block    GAD (generalized anxiety disorder)    GERD (gastroesophageal reflux disease)    Hx of migraines    Hyperlipidemia    Hypertension Dx 2009   Mild depression    Osteomyelitis left first metatarsal 03/02/2014   Sleep apnea    not using CPAP- machine being looked at for repair 03/21/20    Venous stasis ulcer of left lower extremity ([place])    Juan Kline 03/02/2014 - resolved per patient 03/20/20    Past  Surgical History:  Procedure Laterality Date   BIOPSY  08/18/2019   Procedure: BIOPSY;  Surgeon: Juan Park, MD;  Location: [facility];  Service: Gastroenterology;;   ESOPHAGOGASTRODUODENOSCOPY (EGD) WITH PROPOFOL N/A 08/18/2019   Procedure: ESOPHAGOGASTRODUODENOSCOPY (EGD) WITH PROPOFOL;  Surgeon: Juan Park, MD;  Location: [facility];  Service: Gastroenterology;  Laterality: N/A;    FAMHx:  Family History  Problem Relation Age of Onset   Hypertension Mother    Hyperlipidemia Mother    Diabetes Mother    Kidney disease Mother    Hypertension Father    Hyperlipidemia Father    Diabetes Brother    Hypertension Brother    Hyperlipidemia Brother    Heart disease Maternal Grandmother    COPD Maternal Grandmother    Diabetes Maternal Grandfather    Heart attack Paternal Grandmother    Heart disease Paternal Grandfather    Colon cancer Neg Hx    Colon polyps Neg Hx    Stomach cancer Neg Hx    Esophageal cancer Neg Hx    Ulcerative colitis Neg Hx     SOCHx:   reports that he quit smoking about 6 years ago. His smoking use included e-cigarettes and cigarettes. He started smoking about 33 years ago. He has a 39.00 pack-year smoking history. He has never used smokeless tobacco. He reports that he does not currently use alcohol. He reports that he does not use drugs.  ALLERGIES:  Allergies  Allergen Reactions   Lyrica [Pregabalin] Anxiety    ROS: Pertinent items noted in HPI and remainder of comprehensive ROS otherwise negative.  HOME MEDS: Current Outpatient Medications on File Prior to Visit  Medication Sig Dispense Refill   amLODipine (NORVASC) 10 MG tablet Take 1 tablet (10 mg total) by mouth daily. 90 tablet 1   aspirin 81 MG EC tablet Take 1 tablet (81 mg total) by mouth daily. Swallow whole. 30 tablet 11   atorvastatin (LIPITOR) 40 MG tablet Take 1.5 tablets (60 mg total) by mouth daily. 45 tablet 5   clotrimazole-betamethasone (LOTRISONE) cream APPLY 1  APPLICATION TOPICALLY 2 (TWO) TIMES DAILY. (Patient taking differently: Apply 1 application topically 2 (two) times daily as needed (rash).) 45 g 0   losartan (COZAAR) 100 MG tablet Take 1 tablet (100 mg total) by mouth daily. 90 tablet 1   Magnesium Oxide 400 MG CAPS Take 1 capsule (400 mg total) by mouth daily. 90 capsule 3   omeprazole (PRILOSEC) 20 MG capsule Take 20 mg by mouth daily as needed (indigestion).     sertraline (ZOLOFT) 100 MG tablet TAKE 1.5 TABLETS (150 MG TOTAL) BY MOUTH DAILY. 45 tablet 11   No current facility-administered medications on file prior to visit.    LABS/IMAGING: No results found for this or any previous visit (  from the past 48 hour(s)). No results found.  LIPID PANEL:    Component Value Date/Time   CHOL 151 01/05/2021 0459   CHOL 183 08/27/2020 1436   TRIG 300 (H) 01/05/2021 0459   HDL 31 (L) 01/05/2021 0459   HDL 46 08/27/2020 1436   CHOLHDL 4.9 01/05/2021 0459   VLDL 60 (H) 01/05/2021 0459   LDLCALC 60 01/05/2021 0459   LDLCALC 93 08/27/2020 1436     WEIGHTS: Wt Readings from Last 3 Encounters:  01/29/21 (!) 309 lb 9.6 oz (140.4 kg)  01/14/21 (!) 313 lb 3.2 oz (142.1 kg)  01/04/21 (!) 306 lb 7 oz (139 kg)    VITALS: BP 124/81   Pulse 88   Ht 5\' 9"  (1.753 m)   Wt (!) 309 lb 9.6 oz (140.4 kg)   SpO2 98%   BMI 45.72 kg/m   EXAM: General appearance: alert and no distress Neck: no carotid bruit, no JVD and thyroid not enlarged, symmetric, no tenderness/mass/nodules Lungs: clear to auscultation bilaterally Heart: regular rate and rhythm Abdomen: morbidly obese Extremities: venous stasis dermatitis noted and varicose veins Pulses: 2+ and symmetric Skin: LE venous stasis changes Neurologic: Grossly normal Psych: Pleasant  EKG: Deferred  ASSESSMENT: Palpitations Dyspnea with exertion and at rest -moderate obstructive airways disease (2020) by PFT Recurrent chest pain-negative exercise treadmill stress test and normal LVEF on  echo (12/2016) Hypertension Year OSA-unable to tolerate BiPAP Morbid obesity Vaping  PLAN: 1.   Juan Kline continues to have palpitations which have been present for about a month.  He was noted to have some PACs in the hospital.  His magnesium was low and its been repleted.  He is wearing a monitor currently.  I interested to see whether he has any atrial fibrillation because he has multiple risk factors for that including severe apnea.  He has follow-up scheduled on November 2 to review that monitor.  Pixie Casino, MD, [facility], [facility] Director of the Advanced Lipid Disorders &  Cardiovascular Risk Reduction Clinic Diplomate of the American Board of Clinical Lipidology Attending Cardiologist  Direct Dial: [phone]  Fax: [phone]  Website:  [email or web address] 01/29/2021, 3:23 PM

## 2021-01-29 NOTE — Patient Instructions (Signed)
Medication Instructions:  Your physician recommends that you continue on your current medications as directed. Please refer to the Current Medication list given to you today.  *If you need a refill on your cardiac medications before your next appointment, please call your pharmacy*  Testing/Procedures: Continue monitor  Follow-Up: At [facility], you and your health needs are our priority.  As part of our continuing mission to provide you with exceptional heart care, we have created designated Provider Care Teams.  These Care Teams include your primary Cardiologist (physician) and Advanced Practice Providers (APPs -  Physician Assistants and Nurse Practitioners) who all work together to provide you with the care you need, when you need it.  We recommend signing up for the patient portal called "MyChart".  Sign up information is provided on this After Visit Summary.  MyChart is used to connect with patients for Virtual Visits (Telemedicine).  Patients are able to view lab/test results, encounter notes, upcoming appointments, etc.  Non-urgent messages can be sent to your provider as well.   To learn more about what you can do with MyChart, go to [email or web address].    Your next appointment:   As planned with Roby Lofts PA

## 2021-01-30 ENCOUNTER — Ambulatory Visit: Payer: No Typology Code available for payment source | Attending: Internal Medicine

## 2021-01-30 ENCOUNTER — Other Ambulatory Visit: Payer: Self-pay

## 2021-01-30 NOTE — Telephone Encounter (Signed)
SECURE message sent to [facility] [place] inquiring if [place] would be able to change the BiPAP settings and if so, is there a cost?

## 2021-01-31 ENCOUNTER — Encounter: Payer: Self-pay | Admitting: Internal Medicine

## 2021-01-31 ENCOUNTER — Other Ambulatory Visit: Payer: Self-pay

## 2021-01-31 DIAGNOSIS — G4733 Obstructive sleep apnea (adult) (pediatric): Secondary | ICD-10-CM

## 2021-02-01 ENCOUNTER — Telehealth: Payer: Self-pay | Admitting: Internal Medicine

## 2021-02-01 NOTE — Telephone Encounter (Signed)
-----   Message from Ena Dawley sent at 02/01/2021 10:31 AM EDT ----- Regarding: RE: Neurology referral Gm Dr [name]  I contacted [facility] they received the referral on  9/21/ 2022  since patient is uninsured they contacted him and told him to he need to apply with [facility] and contact them when he is approved  they give the information to him  .   ----- Message ----- From: Ladell Pier, MD Sent: 01/28/2021   8:08 AM EDT To: Ena Dawley Subject: Neurology referral                             Any progress in getting him an appt with [facility] neurology for follow up on headaches?

## 2021-02-05 ENCOUNTER — Other Ambulatory Visit: Payer: Self-pay

## 2021-02-07 NOTE — Telephone Encounter (Signed)
Call placed to [facility] # [phone] to check on status of order for BiPAP setting change.  Spoke to [place] who confirmed that the order has been received and she will call the patient to have him come into their retail store for a setting change and there will be no charge.   Call placed to patient and he confirmed that he was just contacted by [employer] and instructed to take his machine to [facility] for setting change. He said he will call the company to see if he needs an appointment or can just walk in.  He then inquired about his ENT referral.  He said that he was contacted by [facility] in [facility] and told that he needs to pay $100 in order to be seen.  He would like to know if there is a [facility] affiliated ENT that he could see because he has [employer].  Explained to him that this CM will need to check with [facility] Specialist.

## 2021-02-08 ENCOUNTER — Other Ambulatory Visit: Payer: Self-pay

## 2021-02-08 ENCOUNTER — Encounter: Payer: Self-pay | Admitting: Internal Medicine

## 2021-02-08 ENCOUNTER — Telehealth: Payer: Self-pay | Admitting: Internal Medicine

## 2021-02-08 NOTE — Telephone Encounter (Signed)
Message from my referral coordinator noted.  I have sent patient a MyChart message letting him know that there is no ENT in the network for the orange card at [facility] discount.

## 2021-02-08 NOTE — Telephone Encounter (Signed)
Copied from [place] [phone]. Topic: General - Call Back - No Documentation >> Feb 06, 2021 11:33 AM Erick Blinks wrote: Reason for CRM: Pt wants to speak to Juan Kline, has questions about some upcoming appts outside of [facility].  Best contact: [phone]

## 2021-02-11 NOTE — Telephone Encounter (Signed)
Pt called back asking to speak with Clifton James, stated he had a questions regarding getting a doctor closer to where he lives, please advise.

## 2021-02-11 NOTE — Telephone Encounter (Signed)
Call received from patient. He has [employer]  and [employer].  He said he has contacted [facility] in [place]  and they accept CFA.  He would like to transfer his care to that practice because it is a lot closer to his home but is not sure how this impacts his CFA and OC for renewals and referrals.  Informed him that Wynonia Hazard would contact him to address his questions.    He also noted that he has an appointment for BiPAP setting change on 02/13/2021.

## 2021-02-12 ENCOUNTER — Other Ambulatory Visit: Payer: Self-pay

## 2021-02-12 MED FILL — Sertraline HCl Tab 100 MG: ORAL | 30 days supply | Qty: 45 | Fill #4 | Status: AC

## 2021-02-12 NOTE — Telephone Encounter (Signed)
I return Pt call, he was inform, that yes he can change PCP close to him, since he is going to used the [place] program he will has to paid the co-paid for the visit every time, and when is time to renew his program he need to send the information to [facility] and [facility] financial by mail, That I won't be able to help him since he dropping out clinic

## 2021-02-13 ENCOUNTER — Encounter: Payer: Self-pay | Admitting: Medical

## 2021-02-13 ENCOUNTER — Other Ambulatory Visit: Payer: Self-pay

## 2021-02-13 ENCOUNTER — Ambulatory Visit (INDEPENDENT_AMBULATORY_CARE_PROVIDER_SITE_OTHER): Payer: No Typology Code available for payment source | Admitting: Medical

## 2021-02-13 VITALS — BP 124/76 | HR 86 | Ht 69.0 in | Wt 312.2 lb

## 2021-02-13 DIAGNOSIS — I10 Essential (primary) hypertension: Secondary | ICD-10-CM

## 2021-02-13 DIAGNOSIS — G4733 Obstructive sleep apnea (adult) (pediatric): Secondary | ICD-10-CM

## 2021-02-13 DIAGNOSIS — R002 Palpitations: Secondary | ICD-10-CM

## 2021-02-13 DIAGNOSIS — R0789 Other chest pain: Secondary | ICD-10-CM

## 2021-02-13 DIAGNOSIS — E785 Hyperlipidemia, unspecified: Secondary | ICD-10-CM

## 2021-02-13 MED ORDER — FENOFIBRATE 120 MG PO TABS
120.0000 mg | ORAL_TABLET | Freq: Every day | ORAL | 1 refills | Status: DC
  Filled 2021-02-13: qty 30, 30d supply, fill #0
  Filled 2021-03-15: qty 30, 30d supply, fill #1
  Filled 2021-04-12: qty 30, 30d supply, fill #2
  Filled 2021-05-15 – 2021-05-17 (×2): qty 30, 30d supply, fill #0
  Filled 2021-06-18 – 2021-06-19 (×2): qty 30, 30d supply, fill #1
  Filled 2021-07-16 – 2021-07-19 (×4): qty 30, 30d supply, fill #2

## 2021-02-13 MED ORDER — METOPROLOL SUCCINATE ER 25 MG PO TB24
12.5000 mg | ORAL_TABLET | Freq: Every day | ORAL | 1 refills | Status: DC
  Filled 2021-02-13: qty 15, 30d supply, fill #0

## 2021-02-13 NOTE — Progress Notes (Signed)
Cardiology Office Note   Date:  02/13/2021   ID:  Juan Kline, DOB 1971/02/08, MRN [id number]  PCP:  Ladell Pier, MD  Cardiologist:  Pixie Casino, MD EP: None  Chief Complaint  Patient presents with   Follow-up    Palpitations      History of Present Illness: Juan Kline is a 50 y.o. male PMH of HTN, HLD, DM type II, COPD, OSA on BiPAP, former alcohol abuse now abstinent, and former tobacco abuse, who presents for follow-up of recent cardiac monitor.  He was admitted to the hospital 12/2020 with complaints of DOE and fatigue as well as atypical chest pain.  He had negative troponins and EKG was nonischemic. He underwent an echocardiogram 12/2020 showed EF 60-65%, indeterminate LV diastolic function, mild concentric LVH, no RWMA, mild biatrial enlargement, and no significant valvular dysfunction.  He had complaints of palpitations the cardiac monitor was unrevealing.  At his post hospital follow-up 01/14/2021 with Doreene Adas, PA-C he reported ongoing issues with palpitations with associated lightheadedness, chest pressure, and breathlessness.  He was recommended to undergo a 2-week monitor to evaluate for arrhythmias.  He was seen in the interim by Dr. Debara Pickett 01/29/2021 with ongoing complaints palpitations.  No medication changes occurred and he was recommended to complete his 2-week monitor.  Cardiac monitor showed sinus rhythm with first-degree AV block, PVCs with burden of 3.5% with episodes of bigeminy and trigeminy noted though no evidence of A. fib, NSVT, or high-grade AV block.Marland Kitchen  He presents today for follow-up of his cardiac monitor.  He reports ongoing sensation of palpitations which generally occur daily oftentimes worse when laying down.  He recalls having symptoms while wearing his monitor but did not have any "really bad episodes".  He has continued to have occasional atypical chest pain, sharp in nature lasting for seconds.  He has not had any presyncope/syncope.  No  complaints of shortness of breath, lower extremity edema, orthopnea, PND.  He reports compliance with his BiPAP with plans for titration later today.  We reviewed his cardiac monitor in detail and discussed management of his PVCs.  He wishes to trial medication to help alleviate symptoms.     Past Medical History:  Diagnosis Date   Aortic atherosclerosis ([place])    Chronic foot ulcer ([place]) admitted 03/02/2014   medial border first metatarsal left foot  - resolved per patient 03/20/20   Diabetes ([place])    type 2 - no meds, diet controlled   Diabetic foot infection ([place])    First degree AV block    GAD (generalized anxiety disorder)    GERD (gastroesophageal reflux disease)    Hx of migraines    Hyperlipidemia    Hypertension Dx 2009   Mild depression    Osteomyelitis left first metatarsal 03/02/2014   Sleep apnea    not using CPAP- machine being looked at for repair 03/21/20    Venous stasis ulcer of left lower extremity ([place])    [name] 03/02/2014 - resolved per patient 03/20/20    Past Surgical History:  Procedure Laterality Date   BIOPSY  08/18/2019   Procedure: BIOPSY;  Surgeon: Thornton Park, MD;  [facility];  Service: Gastroenterology;;   ESOPHAGOGASTRODUODENOSCOPY (EGD) WITH PROPOFOL N/A 08/18/2019   Procedure: ESOPHAGOGASTRODUODENOSCOPY (EGD) WITH PROPOFOL;  Surgeon: Thornton Park, MD;  [facility];  Service: Gastroenterology;  Laterality: N/A;     Current Outpatient Medications  Medication Sig Dispense Refill   amLODipine (NORVASC) 10 MG tablet Take  1 tablet (10 mg total) by mouth daily. 90 tablet 1   aspirin 81 MG EC tablet Take 1 tablet (81 mg total) by mouth daily. Swallow whole. 30 tablet 11   atorvastatin (LIPITOR) 40 MG tablet Take 1.5 tablets (60 mg total) by mouth daily. 45 tablet 5   clotrimazole-betamethasone (LOTRISONE) cream APPLY 1 APPLICATION TOPICALLY 2 (TWO) TIMES DAILY. (Patient taking differently: Apply 1 application topically  2 (two) times daily as needed (rash).) 45 g 0   losartan (COZAAR) 100 MG tablet Take 1 tablet (100 mg total) by mouth daily. 90 tablet 1   Magnesium Oxide 400 MG CAPS Take 1 capsule (400 mg total) by mouth daily. 90 capsule 3   omeprazole (PRILOSEC) 20 MG capsule Take 20 mg by mouth daily as needed (indigestion).     sertraline (ZOLOFT) 100 MG tablet TAKE 1.5 TABLETS (150 MG TOTAL) BY MOUTH DAILY. 45 tablet 11   No current facility-administered medications for this visit.    Allergies:   Lyrica [pregabalin]    Social History:  The patient  reports that he quit smoking about 6 years ago. His smoking use included e-cigarettes and cigarettes. He started smoking about 33 years ago. He has a 39.00 pack-year smoking history. He has never used smokeless tobacco. He reports that he does not currently use alcohol. He reports that he does not use drugs.   Family History:  The patient's family history includes COPD in his maternal grandmother; Diabetes in his brother, maternal grandfather, and mother; Heart attack in his paternal grandmother; Heart disease in his maternal grandmother and paternal grandfather; Hyperlipidemia in his brother, father, and mother; Hypertension in his brother, father, and mother; Kidney disease in his mother.    ROS:  Please see the history of present illness.   Otherwise, review of systems are positive for none.   All other systems are reviewed and negative.    PHYSICAL EXAM: VS:  BP 124/76   Pulse 86   Ht 5\' 9"  (1.753 m)   Wt (!) 312 lb 3.2 oz (141.6 kg)   SpO2 96%   BMI 46.10 kg/m  , BMI Body mass index is 46.1 kg/m. GEN: Well nourished, well developed, in no acute distress HEENT: Sclera anicteric Neck: no JVD, carotid bruits, or masses Cardiac: RRR; no murmurs, rubs, or gallops, no edema  Respiratory:  clear to auscultation bilaterally, normal work of breathing GI: soft, obese, nontender, nondistended, + BS MS: no deformity or atrophy Skin: warm and dry, no  rash Neuro:  Strength and sensation are intact Psych: euthymic mood, full affect   EKG:  EKG is ordered today. The ekg ordered today demonstrates sinus rhythm with first-degree AV block, rate 86 bpm, borderline nonspecific IVCD, no STE/D   Recent Labs: 01/04/2021: ALT 22; B Natriuretic Peptide 14.8; TSH 0.944 01/05/2021: BUN 27; Creatinine, Ser 1.01; Hemoglobin 13.7; Magnesium 1.7; Platelets 239; Potassium 3.4; Sodium 135    Lipid Panel    Component Value Date/Time   CHOL 151 01/05/2021 0459   CHOL 183 08/27/2020 1436   TRIG 300 (H) 01/05/2021 0459   HDL 31 (L) 01/05/2021 0459   HDL 46 08/27/2020 1436   CHOLHDL 4.9 01/05/2021 0459   VLDL 60 (H) 01/05/2021 0459   LDLCALC 60 01/05/2021 0459   LDLCALC 93 08/27/2020 1436      Wt Readings from Last 3 Encounters:  02/13/21 (!) 312 lb 3.2 oz (141.6 kg)  01/29/21 (!) 309 lb 9.6 oz (140.4 kg)  01/14/21 (!) 313 lb  3.2 oz (142.1 kg)      Other studies Reviewed: Additional studies/ records that were reviewed today include:   Echo 01/04/21: 1. Left ventricular ejection fraction, by estimation, is 60 to 65%. The  left ventricle has normal function. The left ventricle has no regional  wall motion abnormalities. There is mild concentric left ventricular  hypertrophy. Indeterminate diastolic  filling due to E-A fusion.   2. Right ventricular systolic function is low normal. The right  ventricular size is mildly enlarged. Tricuspid regurgitation signal is  inadequate for assessing PA pressure.   3. Left atrial size was mildly dilated.   4. Right atrial size was mildly dilated.   5. The mitral valve is grossly normal. No evidence of mitral valve  regurgitation. No evidence of mitral stenosis. Moderate mitral annular  calcification.   6. The aortic valve was not well visualized. Aortic valve regurgitation  is not visualized. No aortic stenosis is present.   7. The inferior vena cava is normal in size with greater than 50%   respiratory variability, suggesting right atrial pressure of 3 mmHg.    ASSESSMENT AND PLAN:  1.  Palpitations: Noted to have occasional PVCs cardiac monitor with burden of 3.5%.  He does have a first-degree AV block but there was no evidence of high-grade AV block on the monitor - We will trial low-dose beta-blocker -start metoprolol succinate 12.5 mg daily. - Will have him return for EKG in 2 weeks for close monitoring of first-degree AV block and borderline IVCD.  2.  Atypical chest pain: Had a negative stress test in 2018.  Most recent echo 12/2020 showed normal EF and no RWMA.  No change in symptoms. -Oh continue monitoring  3. HTN: BP 124/84 today - Continue amlodipine and losartan   4. HLD: LDL 60, triglycerides 300 12/2020 - Continue atorvastatin - Will add fenofibrate 120mg  daily  - Will repeat FLP in 2 months   5. DM type 2: A1C 6.6 08/2020 - Continue metformin   6. OSA: he has been compliant with his BiPAP  - Continued to encourage compliance  7. Anxiety: likely contributing to #1/2.  Overall seems stable. - Continue sertraline   8.  Morbid obesity: BMI 46 today.  We discussed strategies for improved nutrition and weight loss.  Suggested seeing the healthy weight and wellness center though patient reported issues with cost coverage in the past - Encouraged ongoing dietary/lifestyle modifications to promote weight loss   Current medicines are reviewed at length with the patient today.  The patient does not have concerns regarding medicines.  The following changes have been made: As above  Labs/ tests ordered today include:  No orders of the defined types were placed in this encounter.    Disposition:   FU with Dr. Debara Pickett or Doreene Adas, PA-C in 2 months  Signed, Abigail Butts, PA-C  02/13/2021 11:42 AM

## 2021-02-13 NOTE — Patient Instructions (Addendum)
Medication Instructions:  START  Metoprolol Succinate (Toprol-XL) 12.5 mg daily START Fenofibrate 120 mg daily  *If you need a refill on your cardiac medications before your next appointment, please call your pharmacy*  Lab Work: Your physician recommends that you return for lab work TODAY:  BMET Magnesium  Your physician recommends that you return for lab work in 2 MONTHS:  Fasting Lipid Panel-DO NOT EAT OR DRINK PAST MIDNIGHT. OKAY TO HAVE WATER.  If you have labs (blood work) drawn today and your tests are completely normal, you will receive your results only by: [place] (if you have MyChart) OR A paper copy in the mail If you have any lab test that is abnormal or we need to change your treatment, we will call you to review the results.  Testing/Procedures: NONE ordered at this time of appointment   Follow-Up: At [facility], you and your health needs are our priority.  As part of our continuing mission to provide you with exceptional heart care, we have created designated Provider Care Teams.  These Care Teams include your primary Cardiologist (physician) and Advanced Practice Providers (APPs -  Physician Assistants and Nurse Practitioners) who all work together to provide you with the care you need, when you need it.  Your next appointment:   2 week(s) for EKG 2 month(s)  The format for your next appointment:   In Person In Person  Provider:   Nurse Visit Dr. Debara Pickett or Fabian Sharp, PA-C  Other Instructions

## 2021-02-14 ENCOUNTER — Other Ambulatory Visit: Payer: Self-pay

## 2021-02-14 LAB — BASIC METABOLIC PANEL
BUN/Creatinine Ratio: 23 — ABNORMAL HIGH (ref 9–20)
BUN: 19 mg/dL (ref 6–24)
CO2: 24 mmol/L (ref 20–29)
Calcium: 9.6 mg/dL (ref 8.7–10.2)
Chloride: 100 mmol/L (ref 96–106)
Creatinine, Ser: 0.84 mg/dL (ref 0.76–1.27)
Glucose: 144 mg/dL — ABNORMAL HIGH (ref 70–99)
Potassium: 4.4 mmol/L (ref 3.5–5.2)
Sodium: 138 mmol/L (ref 134–144)
eGFR: 107 mL/min/{1.73_m2} (ref >59)

## 2021-02-14 LAB — MAGNESIUM: Magnesium: 1.8 mg/dL (ref 1.6–2.3)

## 2021-02-27 ENCOUNTER — Other Ambulatory Visit: Payer: Self-pay

## 2021-02-27 ENCOUNTER — Ambulatory Visit (INDEPENDENT_AMBULATORY_CARE_PROVIDER_SITE_OTHER): Payer: No Typology Code available for payment source | Admitting: Internal Medicine

## 2021-02-27 VITALS — BP 102/68 | HR 67

## 2021-02-27 DIAGNOSIS — R0602 Shortness of breath: Secondary | ICD-10-CM

## 2021-02-27 DIAGNOSIS — R002 Palpitations: Secondary | ICD-10-CM

## 2021-02-27 DIAGNOSIS — I493 Ventricular premature depolarization: Secondary | ICD-10-CM

## 2021-02-27 NOTE — Patient Instructions (Signed)
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 EP scheduler will call back with appointment.

## 2021-03-04 ENCOUNTER — Other Ambulatory Visit: Payer: Self-pay

## 2021-03-08 ENCOUNTER — Other Ambulatory Visit: Payer: Self-pay | Admitting: Internal Medicine

## 2021-03-08 DIAGNOSIS — E1142 Type 2 diabetes mellitus with diabetic polyneuropathy: Secondary | ICD-10-CM

## 2021-03-08 MED FILL — Sertraline HCl Tab 100 MG: ORAL | 30 days supply | Qty: 45 | Fill #5 | Status: AC

## 2021-03-09 MED ORDER — ATORVASTATIN CALCIUM 40 MG PO TABS
60.0000 mg | ORAL_TABLET | Freq: Every day | ORAL | 5 refills | Status: DC
  Filled 2021-03-09: qty 45, 30d supply, fill #0
  Filled 2021-04-05: qty 45, 30d supply, fill #1
  Filled 2021-05-09 – 2021-06-12 (×3): qty 45, 30d supply, fill #0
  Filled 2021-06-12 – 2021-07-09 (×4): qty 45, 30d supply, fill #1
  Filled 2021-08-06: qty 45, 30d supply, fill #2

## 2021-03-09 NOTE — Telephone Encounter (Signed)
Requested Prescriptions  Pending Prescriptions Disp Refills  . atorvastatin (LIPITOR) 40 MG tablet 45 tablet 5    Sig: Take 1.5 tablets (60 mg total) by mouth daily.     Cardiovascular:  Antilipid - Statins Failed - 03/08/2021  2:52 PM      Failed - HDL in normal range and within 360 days    HDL  Date Value Ref Range Status  01/05/2021 31 (L) >40 mg/dL Final  08/27/2020 46 >39 mg/dL Final         Failed - Triglycerides in normal range and within 360 days    Triglycerides  Date Value Ref Range Status  01/05/2021 300 (H) <150 mg/dL Final         Passed - Total Cholesterol in normal range and within 360 days    Cholesterol, Total  Date Value Ref Range Status  08/27/2020 183 100 - 199 mg/dL Final   Cholesterol  Date Value Ref Range Status  01/05/2021 151 0 - 200 mg/dL Final         Passed - LDL in normal range and within 360 days    LDL Chol Calc (NIH)  Date Value Ref Range Status  08/27/2020 93 0 - 99 mg/dL Final   LDL Cholesterol  Date Value Ref Range Status  01/05/2021 60 0 - 99 mg/dL Final    Comment:           Total Cholesterol/HDL:CHD Risk Coronary Heart Disease Risk Table                     Men   Women  1/2 Average Risk   3.4   3.3  Average Risk       5.0   4.4  2 X Average Risk   9.6   7.1  3 X Average Risk  23.4   11.0        Use the calculated Patient Ratio above and the CHD Risk Table to determine the patient's CHD Risk.        ATP III CLASSIFICATION (LDL):  <100     mg/dL   Optimal  100-129  mg/dL   Near or Above                    Optimal  130-159  mg/dL   Borderline  160-189  mg/dL   High  >190     mg/dL   Very High Performed at [facility] [address] [place], [place] [address]          Passed - Patient is not pregnant      Passed - Valid encounter within last 12 months    Recent Outpatient Visits          2 months ago Migraine without aura and without status migrainosus, not intractable   Posey [place], Neoma Laming B, MD   6 months ago Type 2 diabetes mellitus with diabetic polyneuropathy, without long-term current use of insulin ([place])   M[place], MD   9 months ago OSA (obstructive sleep apnea)   [place] Elsie Stain, MD   9 months ago [place], MD   10 months ago Type 2 diabetes mellitus with diabetic polyneuropathy, without long-term current use of insulin [facility])   Felida, Deborah B,  MD      Future Appointments            In 2 weeks Carlota Raspberry, Ranell Patrick, MD [facility] [place], [place]   In [place], Tami Lin, [place] [address] [place], [place]   In 1 month [name], Dalbert Batman, MD [place]

## 2021-03-11 ENCOUNTER — Other Ambulatory Visit: Payer: Self-pay

## 2021-03-18 ENCOUNTER — Other Ambulatory Visit: Payer: Self-pay

## 2021-03-20 ENCOUNTER — Encounter: Payer: Self-pay | Admitting: Internal Medicine

## 2021-03-21 ENCOUNTER — Encounter: Payer: Self-pay | Admitting: Internal Medicine

## 2021-03-21 ENCOUNTER — Ambulatory Visit: Payer: No Typology Code available for payment source | Admitting: Internal Medicine

## 2021-03-21 ENCOUNTER — Other Ambulatory Visit: Payer: Self-pay

## 2021-03-21 DIAGNOSIS — R002 Palpitations: Secondary | ICD-10-CM

## 2021-03-21 MED ORDER — FLECAINIDE ACETATE 50 MG PO TABS
50.0000 mg | ORAL_TABLET | Freq: Two times a day (BID) | ORAL | 11 refills | Status: DC
  Filled 2021-03-21 – 2021-06-17 (×4): qty 60, 30d supply, fill #0
  Filled 2021-08-13 (×2): qty 60, 30d supply, fill #1
  Filled 2021-09-16 – 2021-09-17 (×2): qty 60, 30d supply, fill #2
  Filled 2021-10-23: qty 60, 30d supply, fill #3

## 2021-03-21 NOTE — Patient Instructions (Addendum)
Medication Instructions:  Your physician has recommended you make the following change in your medication:    START taking flecainide 50 mg-  Take one tablet by mouth twice a day.  Labwork: None ordered.  Testing/Procedures: None ordered.  Follow-Up: Your physician wants you to follow-up-April 05, 2021 at 11:00 am at the [facility] office for a nurse visit/EKG.  Any Other Special Instructions Will Be Listed Below (If Applicable).  If you need a refill on your cardiac medications before your next appointment, please call your pharmacy.   Flecainide Tablets What is this medication? FLECAINIDE (FLEK a nide) prevents and treats a fast or irregular heartbeat (arrhythmia). It is often used to treat a type of arrhythmia known as AFib (atrial fibrillation). It works by slowing down overactive electric signals in the heart, which stabilizes your heart rhythm. It belongs to a group of medications called antiarrhythmics. This medicine may be used for other purposes; ask your health care provider or pharmacist if you have questions. COMMON BRAND NAME(S): Tambocor What should I tell my care team before I take this medication? They need to know if you have any of these conditions: Abnormal levels of potassium in the blood Heart disease including heart rhythm and heart rate problems Kidney or liver disease Recent heart attack An unusual or allergic reaction to flecainide, local anesthetics, other medications, foods, dyes, or preservatives Pregnant or trying to get pregnant Breast-feeding How should I use this medication? Take this medication by mouth with a glass of water. Follow the directions on the prescription label. You can take this medication with or without food. Take your doses at regular intervals. Do not take your medication more often than directed. Do not stop taking this medication suddenly. This may cause serious, heart-related side effects. If your care team wants you to stop the  medication, the dose may be slowly lowered over time to avoid any side effects. Talk to your care team regarding the use of this medication in children. While this medication may be prescribed for children as young as 1 year of age for selected conditions, precautions do apply. Overdosage: If you think you have taken too much of this medicine contact a poison control center or emergency room at once. NOTE: This medicine is only for you. Do not share this medicine with others. What if I miss a dose? If you miss a dose, take it as soon as you can. If it is almost time for your next dose, take only that dose. Do not take double or extra doses. What may interact with this medication? Do not take this medication with any of the following: Amoxapine Arsenic trioxide Certain antibiotics like clarithromycin, erythromycin, gatifloxacin, gemifloxacin, levofloxacin, moxifloxacin, sparfloxacin, or troleandomycin Certain antidepressants called tricyclic antidepressants like amitriptyline, imipramine, or nortriptyline Certain medications to control heart rhythm like disopyramide, encainide, moricizine, procainamide, propafenone, and quinidine Cisapride Delavirdine Droperidol Haloperidol [place] Imatinib Levomethadyl Maprotiline Medications for malaria like chloroquine and halofantrine Pentamidine Phenothiazines like chlorpromazine, mesoridazine, prochlorperazine, thioridazine Pimozide Quinine Ranolazine Ritonavir Sertindole This medication may also interact with the following: Cimetidine Dofetilide Medications for angina or high blood pressure Medications to control heart rhythm like amiodarone and digoxin Ziprasidone This list may not describe all possible interactions. Give your health care provider a list of all the medicines, herbs, non-prescription drugs, or dietary supplements you use. Also tell them if you smoke, drink alcohol, or use illegal drugs. Some items may interact with your  medicine. What should I watch for while using this  medication? Visit your care team for regular checks on your progress. Because your condition and the use of this medication carries some risk, it is a good idea to carry an identification card, necklace or bracelet with details of your condition, medications, and care team. Check your blood pressure and pulse rate regularly. Ask your care team what your blood pressure and pulse rate should be, and when you should contact them. Your care team also may schedule regular blood tests and electrocardiograms to check your progress. You may get drowsy or dizzy. Do not drive, use machinery, or do anything that needs mental alertness until you know how this medication affects you. Do not stand or sit up quickly, especially if you are an older patient. This reduces the risk of dizzy or fainting spells. Alcohol can make you more dizzy, increase flushing and rapid heartbeats. Avoid alcoholic drinks. What side effects may I notice from receiving this medication? Side effects that you should report to your care team as soon as possible: Allergic reactions--skin rash, itching, hives, swelling of the face, lips, tongue, or throat Heart failure--shortness of breath, swelling of the ankles, feet, or hands, sudden weight gain, unusual weakness or fatigue Heart rhythm changes--fast or irregular heartbeat, dizziness, feeling faint or lightheaded, chest pain, trouble breathing Liver injury--right upper belly pain, loss of appetite, nausea, light-colored stool, dark yellow or brown urine, yellowing skin or eyes, unusual weakness or fatigue Side effects that usually do not require medical attention (report to your care team if they continue or are bothersome): Blurry vision Constipation Dizziness Fatigue Headache Nausea Tremors or shaking This list may not describe all possible side effects. Call your doctor for medical advice about side effects. You may report side  effects to FDA at 1-800-FDA-1088. Where should I keep my medication? Keep out of the reach of children and pets. Store at room temperature between 15 and 30 degrees C (59 and 86 degrees F). Protect from light. Keep container tightly closed. Throw away any unused medication after the expiration date. NOTE: This sheet is a summary. It may not cover all possible information. If you have questions about this medicine, talk to your doctor, pharmacist, or health care provider.  2022 Elsevier/Gold Standard (2020-05-25 00:00:00)

## 2021-03-21 NOTE — Progress Notes (Signed)
HPI Mr. Dobler is referred today by Dr. Debara Pickett for evaluation of PVC's. He is an obese middle age man with HTN, dyslipidemia, who has note an irregular heart beat. He wore a cardiac monitor which demonstrated less than 4% PVC's. He has not had syncope. He has preserved LV function by echo. He is being evaluated for sleep apnea but has had trouble tolerating the bipap. He notes that the PVC's come and go.  Allergies  Allergen Reactions   Lyrica [Pregabalin] Anxiety     Current Outpatient Medications  Medication Sig Dispense Refill   amLODipine (NORVASC) 10 MG tablet Take 1 tablet (10 mg total) by mouth daily. 90 tablet 1   aspirin 81 MG EC tablet Take 1 tablet (81 mg total) by mouth daily. Swallow whole. 30 tablet 11   atorvastatin (LIPITOR) 40 MG tablet Take 1.5 tablets (60 mg total) by mouth daily. 45 tablet 5   clotrimazole-betamethasone (LOTRISONE) cream APPLY 1 APPLICATION TOPICALLY 2 (TWO) TIMES DAILY. (Patient taking differently: Apply 1 application topically 2 (two) times daily as needed (rash).) 45 g 0   Fenofibrate 120 MG TABS Take 1 tablet (120 mg total) by mouth daily. 90 tablet 1   flecainide (TAMBOCOR) 50 MG tablet Take 1 tablet (50 mg total) by mouth 2 (two) times daily. 60 tablet 11   losartan (COZAAR) 100 MG tablet Take 1 tablet (100 mg total) by mouth daily. 90 tablet 1   Magnesium 400 MG CAPS Take 600 mg by mouth daily.     omeprazole (PRILOSEC) 20 MG capsule Take 20 mg by mouth daily as needed (indigestion).     sertraline (ZOLOFT) 100 MG tablet TAKE 1.5 TABLETS (150 MG TOTAL) BY MOUTH DAILY. 45 tablet 11   No current facility-administered medications for this visit.     Past Medical History:  Diagnosis Date   Aortic atherosclerosis ([place])    Chronic foot ulcer ([place]) admitted 03/02/2014   medial border first metatarsal left foot  - resolved per patient 03/20/20   Diabetes ([place])    type 2 - no meds, diet controlled   Diabetic foot infection ([place])    First  degree AV block    GAD (generalized anxiety disorder)    GERD (gastroesophageal reflux disease)    Hx of migraines    Hyperlipidemia    Hypertension Dx 2009   Mild depression    Osteomyelitis left first metatarsal 03/02/2014   Sleep apnea    not using CPAP- machine being looked at for repair 03/21/20    Venous stasis ulcer of left lower extremity ([place])    [name] 03/02/2014 - resolved per patient 03/20/20    ROS:   All systems reviewed and negative except as noted in the HPI.   Past Surgical History:  Procedure Laterality Date   BIOPSY  08/18/2019   Procedure: BIOPSY;  Surgeon: Thornton Park, MD;  Location: [facility];  Service: Gastroenterology;;   ESOPHAGOGASTRODUODENOSCOPY (EGD) WITH PROPOFOL N/A 08/18/2019   Procedure: ESOPHAGOGASTRODUODENOSCOPY (EGD) WITH PROPOFOL;  Surgeon: Thornton Park, MD;  Location: [facility];  Service: Gastroenterology;  Laterality: N/A;     Family History  Problem Relation Age of Onset   Hypertension Mother    Hyperlipidemia Mother    Diabetes Mother    Kidney disease Mother    Hypertension Father    Hyperlipidemia Father    Diabetes Brother    Hypertension Brother    Hyperlipidemia Brother    Heart disease Maternal Grandmother    COPD  Maternal Grandmother    Diabetes Maternal Grandfather    Heart attack Paternal Grandmother    Heart disease Paternal Grandfather    Colon cancer Neg Hx    Colon polyps Neg Hx    Stomach cancer Neg Hx    Esophageal cancer Neg Hx    Ulcerative colitis Neg Hx      Social History   Socioeconomic History   Marital status: Single    Spouse name: Not on file   Number of children: 0   Years of education: Not on file   Highest education level: Not on file  Occupational History   Not on file  Tobacco Use   Smoking status: Former    Packs/day: 1.50    Years: 26.00    Pack years: 39.00    Types: E-cigarettes, Cigarettes    Start date: 1989    Quit date: 02/16/2014    Years since quitting:  7.0   Smokeless tobacco: Never   Tobacco comments:    occasional vapes - no nicotine  Vaping Use   Vaping Use: Every day   Start date: 04/15/2015   Last attempt to quit: 04/11/2019   Substances: Nicotine  Substance and Sexual Activity   Alcohol use: Not Currently    Comment: 03/02/2014 "I rarely drink"   Drug use: No   Sexual activity: Not Currently  Other Topics Concern   Not on file  Social History Narrative   Pt lives in 1 story home with his mother and step father   9th grade education   Last occupation was with [employer] in 2009      Unemployed at this time- thinking of filing for disability      Right handed      Social Determinants of Health   Financial Resource Strain: Not on file  Food Insecurity: Not on file  Transportation Needs: Not on file  Physical Activity: Not on file  Stress: Not on file  Social Connections: Not on file  Intimate Partner Violence: Not on file     BP 124/72   Pulse 77   Ht 5\' 9"  (1.753 m)   Wt (!) 316 lb (143.3 kg)   BMI 46.67 kg/m   Physical Exam:  obese appearing middle aged man, NAD HEENT: Unremarkable Neck:  No JVD, no thyromegally Lymphatics:  No adenopathy Back:  No CVA tenderness Lungs:  Clear with no wheezes HEART:  Regular rate rhythm, no murmurs, no rubs, no clicks Abd:  soft, positive bowel sounds, no organomegally, no rebound, no guarding Ext:  2 plus pulses, no edema, no cyanosis, no clubbing Skin:  No rashes no nodules Neuro:  CN II through XII intact, motor grossly intact  EKG - NSR with a single PVC with a RBBB right superior axis.  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 I have offered him watchful waiting or a trial of low dose flecainide. He would like to try to flecainide and will uptitrate as tolerated/required. Obesity - he is encouraged to lose weight Sleep apnea - this is not the cause of the PVC's but will need to be  treated. Discussed the importance of bipap.  Carleene Overlie Natalye Kott,MD

## 2021-03-24 ENCOUNTER — Encounter: Payer: Self-pay | Admitting: Internal Medicine

## 2021-03-28 ENCOUNTER — Encounter: Payer: Self-pay | Admitting: Family Medicine

## 2021-03-28 ENCOUNTER — Other Ambulatory Visit: Payer: Self-pay

## 2021-03-28 ENCOUNTER — Ambulatory Visit (INDEPENDENT_AMBULATORY_CARE_PROVIDER_SITE_OTHER): Payer: No Typology Code available for payment source | Admitting: Family Medicine

## 2021-03-28 VITALS — BP 130/72 | HR 77 | Temp 98.2°F | Resp 17 | Ht 70.0 in | Wt 314.8 lb

## 2021-03-28 DIAGNOSIS — F32A Depression, unspecified: Secondary | ICD-10-CM

## 2021-03-28 DIAGNOSIS — G609 Hereditary and idiopathic neuropathy, unspecified: Secondary | ICD-10-CM

## 2021-03-28 DIAGNOSIS — G4733 Obstructive sleep apnea (adult) (pediatric): Secondary | ICD-10-CM

## 2021-03-28 DIAGNOSIS — E1142 Type 2 diabetes mellitus with diabetic polyneuropathy: Secondary | ICD-10-CM

## 2021-03-28 DIAGNOSIS — E7849 Other hyperlipidemia: Secondary | ICD-10-CM

## 2021-03-28 DIAGNOSIS — R002 Palpitations: Secondary | ICD-10-CM

## 2021-03-28 DIAGNOSIS — F411 Generalized anxiety disorder: Secondary | ICD-10-CM

## 2021-03-28 DIAGNOSIS — Z23 Encounter for immunization: Secondary | ICD-10-CM

## 2021-03-28 LAB — COMPREHENSIVE METABOLIC PANEL
ALT: 23 U/L (ref 0–53)
AST: 19 U/L (ref 0–37)
Albumin: 4.2 g/dL (ref 3.5–5.2)
Alkaline Phosphatase: 42 U/L (ref 39–117)
BUN: 29 mg/dL — ABNORMAL HIGH (ref 6–23)
CO2: 27 mEq/L (ref 19–32)
Calcium: 9.8 mg/dL (ref 8.4–10.5)
Chloride: 101 mEq/L (ref 96–112)
Creatinine, Ser: 1.01 mg/dL (ref 0.40–1.50)
GFR: 87.33 mL/min (ref >60.00)
Glucose, Bld: 120 mg/dL — ABNORMAL HIGH (ref 70–99)
Potassium: 4.4 mEq/L (ref 3.5–5.1)
Sodium: 138 mEq/L (ref 135–145)
Total Bilirubin: 0.5 mg/dL (ref 0.2–1.2)
Total Protein: 7.5 g/dL (ref 6.0–8.3)

## 2021-03-28 LAB — LIPID PANEL
Cholesterol: 168 mg/dL (ref 0–200)
HDL: 43.8 mg/dL (ref >39.00)
LDL Cholesterol: 89 mg/dL (ref 0–99)
NonHDL: 124.68
Total CHOL/HDL Ratio: 4
Triglycerides: 179 mg/dL — ABNORMAL HIGH (ref 0.0–149.0)
VLDL: 35.8 mg/dL (ref 0.0–40.0)

## 2021-03-28 LAB — HEMOGLOBIN A1C: Hgb A1c MFr Bld: 6.4 % (ref 4.6–6.5)

## 2021-03-28 MED ORDER — SERTRALINE HCL 100 MG PO TABS
ORAL_TABLET | ORAL | 11 refills | Status: DC
  Filled 2021-03-28 – 2021-05-09 (×2): qty 45, fill #0
  Filled 2021-05-10: qty 45, 30d supply, fill #0
  Filled 2021-06-16: qty 45, 30d supply, fill #1
  Filled 2021-06-17: qty 135, 90d supply, fill #0
  Filled 2021-06-17: qty 45, 30d supply, fill #1
  Filled 2021-09-16: qty 135, 90d supply, fill #1
  Filled 2021-09-17: qty 45, 30d supply, fill #1
  Filled 2021-12-29 – 2021-12-30 (×2): qty 45, 30d supply, fill #2
  Filled 2022-01-29: qty 45, 30d supply, fill #3

## 2021-03-28 NOTE — Patient Instructions (Addendum)
I would discuss flecainide need and palpitations with your cardiologist and electrophysiologist. Walking is likely ok for exercise - ask cardiology about type of exercise. Watch portion sizes for diet and follow up in 1 month to discuss labs and med options.   If worsening anxiety would consider higher dose zoloft. See info below and let me know if you would like to meet with a therapist.   I do recommend new covid vaccine booster.   Nice meeting you today.  Recheck in 1 month.  Let me know if there are questions sooner.  Managing Anxiety, Adult After being diagnosed with anxiety, you may be relieved to know why you have felt or behaved a certain way. You may also feel overwhelmed about the treatment ahead and what it will mean for your life. With care and support, you can manage this condition. How to manage lifestyle changes Managing stress and anxiety Stress is your body's reaction to life changes and events, both good and bad. Most stress will last just a few hours, but stress can be ongoing and can lead to more than just stress. Although stress can play a major role in anxiety, it is not the same as anxiety. Stress is usually caused by something external, such as a deadline, test, or competition. Stress normally passes after the triggering event has ended.  Anxiety is caused by something internal, such as imagining a terrible outcome or worrying that something will go wrong that will devastate you. Anxiety often does not go away even after the triggering event is over, and it can become long-term (chronic) worry. It is important to understand the differences between stress and anxiety and to manage your stress effectively so that it does not lead to an anxious response. Talk with your health care provider or a counselor to learn more about reducing anxiety and stress. He or she may suggest tension reduction techniques, such as: Music therapy. Spend time creating or listening to music that you  enjoy and that inspires you. Mindfulness-based meditation. Practice being aware of your normal breaths while not trying to control your breathing. It can be done while sitting or walking. Centering prayer. This involves focusing on a word, phrase, or sacred image that means something to you and brings you peace. Deep breathing. To do this, expand your stomach and inhale slowly through your nose. Hold your breath for 3-5 seconds. Then exhale slowly, letting your stomach muscles relax. Self-talk. Learn to notice and identify thought patterns that lead to anxiety reactions and change those patterns to thoughts that feel peaceful. Muscle relaxation. Taking time to tense muscles and then relax them. Choose a tension reduction technique that fits your lifestyle and personality. These techniques take time and practice. Set aside 5-15 minutes a day to do them. Therapists can offer counseling and training in these techniques. The training to help with anxiety may be covered by some insurance plans. Other things you can do to manage stress and anxiety include: Keeping a stress diary. This can help you learn what triggers your reaction and then learn ways to manage your response. Thinking about how you react to certain situations. You may not be able to control everything, but you can control your response. Making time for activities that help you relax and not feeling guilty about spending your time in this way. Doing visual imagery. This involves imagining or creating mental pictures to help you relax. Practicing yoga. Through yoga poses, you can lower tension and promote relaxation.  Medicines  Medicines can help ease symptoms. Medicines for anxiety include: Antidepressant medicines. These are usually prescribed for long-term daily control. Anti-anxiety medicines. These may be added in severe cases, especially when panic attacks occur. Medicines will be prescribed by a health care provider. When used  together, medicines, psychotherapy, and tension reduction techniques may be the most effective treatment. Relationships Relationships can play a big part in helping you recover. Try to spend more time connecting with trusted friends and family members. Consider going to couples counseling if you have a partner, taking family education classes, or going to family therapy. Therapy can help you and others better understand your condition. How to recognize changes in your anxiety Everyone responds differently to treatment for anxiety. Recovery from anxiety happens when symptoms decrease and stop interfering with your daily activities at home or work. This may mean that you will start to: Have better concentration and focus. Worry will interfere less in your daily thinking. Sleep better. Be less irritable. Have more energy. Have improved memory. It is also important to recognize when your condition is getting worse. Contact your health care provider if your symptoms interfere with home or work and you feel like your condition is not improving. Follow these instructions at home: Activity Exercise. Adults should do the following: Exercise for at least 150 minutes each week. The exercise should increase your heart rate and make you sweat (moderate-intensity exercise). Strengthening exercises at least twice a week. Get the right amount and quality of sleep. Most adults need 7-9 hours of sleep each night. Lifestyle  Eat a healthy diet that includes plenty of vegetables, fruits, whole grains, low-fat dairy products, and lean protein. Do not eat a lot of foods that are high in fats, added sugars, or salt (sodium). Make choices that simplify your life. Do not use any products that contain nicotine or tobacco. These products include cigarettes, chewing tobacco, and vaping devices, such as e-cigarettes. If you need help quitting, ask your health care provider. Avoid caffeine, alcohol, and certain  over-the-counter cold medicines. These may make you feel worse. Ask your pharmacist which medicines to avoid. General instructions Take over-the-counter and prescription medicines only as told by your health care provider. Keep all follow-up visits. This is important. Where to find support You can get help and support from these sources: Self-help groups. Online and [employer]. A trusted spiritual leader. Couples counseling. Family education classes. Family therapy. Where to find more information You may find that joining a support group helps you deal with your anxiety. The following sources can help you locate counselors or support groups near you: [place]: www.mentalhealth[place].net Anxiety and Depression Association of [place] (ADAA): https://www.clark.net/ National Alliance on Mental Illness (NAMI): www.nami.org Contact a health care provider if: You have a hard time staying focused or finishing daily tasks. You spend many hours a day feeling worried about everyday life. You become exhausted by worry. You start to have headaches or frequently feel tense. You develop chronic nausea or diarrhea. Get help right away if: You have a racing heart and shortness of breath. You have thoughts of hurting yourself or others. If you ever feel like you may hurt yourself or others, or have thoughts about taking your own life, get help right away. Go to your nearest emergency department or: Call your local emergency services (911 in the [place] Call a suicide crisis helpline, such as the [place] at 430 015 5253 or 988 in the [place]. This is open 24 hours a day  in the [place] Text the Crisis Text Line at 248 236 8629 (in the [place].). Summary Taking steps to learn and use tension reduction techniques can help calm you and help prevent triggering an anxiety reaction. When used together, medicines, psychotherapy, and tension reduction techniques may be the most  effective treatment. Family, friends, and partners can play a big part in supporting you. This information is not intended to replace advice given to you by your health care provider. Make sure you discuss any questions you have with your health care provider. Document Revised: 10/24/2020 Document Reviewed: 07/22/2020 Elsevier Patient Education  [employer].

## 2021-03-28 NOTE — Progress Notes (Signed)
Subjective:  Patient ID: Juan Kline, male    DOB: 07-27-1970  Age: 50 y.o. MRN: [id number]  CC:  Chief Complaint  Patient presents with   New Patient (Initial Visit)    Pt here to establish closer to home. Has been seeing Cards for PVCs Dr Juan Kline     HPI Juan Kline presents for   New patient to establish care. Prior PCP Dr. Wynetta Kline. Looking for change and closer practice.  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  Nonsmoker, no alcohol. Lives in [place], grew up in [place].   Cardiac History of PVCs, hypertension, dyslipidemia. Cardiologist Dr. Debara Kline, electrophysiologist Dr. Lovena Le.  Most recent visit December 8.  Cardiac monitor with less than 4% PVCs.  Preserved LV function by echo.  Difficulty tolerating BiPAP for sleep apnea.  Did not tolerate or have significant improvement with low-dose beta-blocker.  Started on low-dose flecainide at 50 mg twice daily  at his December 8 visit. Took flecainide - feels like more palpitations on meds? Doing better off meds. No recent significant palpitations.   Amlodipine 10 mg daily, losartan 100 mg daily for hypertension. BP Readings from Last 3 Encounters:  03/28/21 130/72  03/21/21 124/72  02/27/21 102/68     Hyperlipidemia: Lipitor 60 mg daily, fenofibrate 120 mg daily (started recently  for trig's, diarrhea initially improved). No new side effects.  Decaf tea few hrs ago. No food today.  Lab Results  Component Value Date   CHOL 151 01/05/2021   HDL 31 (L) 01/05/2021   LDLCALC 60 01/05/2021   TRIG 300 (H) 01/05/2021   CHOLHDL 4.9 01/05/2021   Lab Results  Component Value Date   ALT 22 01/04/2021   AST 21 01/04/2021   ALKPHOS 54 01/04/2021   BILITOT 0.6 01/04/2021   Generalized anxiety disorder Treated with Zoloft 150 mg daily. Doing ok. Some anxiety with medical issues.  No counselor currently. Prior psychiatrist - not now. Would like to stay on same dose.  GAD 7 : Generalized Anxiety Score 12/28/2020 08/27/2020 01/24/2020 09/26/2019   Nervous, Anxious, on Edge 3 0 0 0  Control/stop worrying 3 2 1 2   Worry too much - different things 3 2 1  0  Trouble relaxing 3 2 0 0  Restless 0 0 0 0  Easily annoyed or irritable 1 0 0 0  Afraid - awful might happen 2 0 0 0  Total GAD 7 Score 15 6 2 2   Anxiety Difficulty - - - -   Depression screen [facility] 2/9 03/28/2021 12/28/2020 08/27/2020 05/24/2020 02/07/2020  Decreased Interest 1 2 3 2  0  Down, Depressed, Hopeless 1 3 3 1  0  PHQ - 2 Score 2 5 6 3  0  Altered sleeping 1 2 0 2 -  Tired, decreased energy 3 3 3 1  -  Change in appetite 2 1 2  0 -  Feeling bad or failure about yourself  0 0 1 0 -  Trouble concentrating 0 2 1 0 -  Moving slowly or fidgety/restless 0 0 0 0 -  Suicidal thoughts 0 1 1 0 -  PHQ-9 Score 8 14 14 6  -  Some recent data might be hidden    Obstructive sleep apnea Treated with BiPAP. Now using nightly. New mask attachment helps with seal.   Diabetes: Associated with obesity, hyperlipidemia, microalbuminuria. hx peripheral neuropathy. Did not tolerate lyrica, gabapentin - doing ok off meds. Just numb, no pain typically.  Microalbumin: ratio elevated 112 in 1/22. On ARB and statin.  Optho, foot exam,  pneumovax:  Optho - in past few months. [facility] on [employer]. Unsure if diabetic screen.  Flu vaccine today.  Covid booster: none. Initial series only. Tolerated initial series. Possible disease last year.  Home reading postprandial 143 last nt.  Considering weight loss medication. No soda, sweet tea.  Fast food - rare.  Cooks at home.  No exercise at this time.   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   Positive for macroalbuminuria 03/06/2019   Vapes nicotine containing substance 03/03/2019   Class 3 severe obesity due to excess calories without serious comorbidity with body mass index (BMI) of 45.0 to 49.9 in adult [facility]) 03/03/2019   Hyperlipidemia associated with type 2 diabetes mellitus ([place]) 03/03/2019   Type 2 diabetes mellitus with diabetic polyneuropathy, without long-term current use of insulin ([place]) 04/13/2018   Mild depression 04/01/2018   OSA (obstructive sleep apnea) 10/26/2017   Anxiety disorder 10/26/2017   Peripheral neuropathy 12/12/2016   GAD (generalized anxiety disorder) 05/11/2015   Former smoker 05/11/2015   Other hyperlipidemia 05/22/2014   Vitamin D insufficiency 05/22/2014   Chronic intractable headache 05/19/2014   Numbness and tingling of foot 05/19/2014   Venous stasis dermatitis of both lower extremities 05/19/2014   history of Hypertension 03/02/2014   Morbid obesity ([place]) 03/02/2014   Past Medical History:  Diagnosis Date   Aortic atherosclerosis ([place])    Chronic foot ulcer ([place]) admitted 03/02/2014   medial border first metatarsal left foot  - resolved per patient 03/20/20   Diabetes ([place])    type 2 - no meds, diet controlled   Diabetic foot infection ([place])    First degree AV block    GAD (generalized anxiety disorder)    GERD (gastroesophageal reflux disease)    Hx of migraines    Hyperlipidemia    Hypertension Dx 2009   Mild depression    Osteomyelitis left first metatarsal 03/02/2014   Sleep apnea    not using CPAP- machine being looked at for repair 03/21/20    Venous stasis ulcer of left lower extremity ([place])    [name] 03/02/2014 - resolved per patient 03/20/20   Past Surgical History:  Procedure  Laterality Date   BIOPSY  08/18/2019   Procedure: BIOPSY;  Surgeon: Thornton Park, MD;  Location: [facility];  Service: Gastroenterology;;   ESOPHAGOGASTRODUODENOSCOPY (EGD) WITH PROPOFOL N/A 08/18/2019   Procedure: ESOPHAGOGASTRODUODENOSCOPY (EGD) WITH PROPOFOL;  Surgeon: Thornton Park, MD;  Location: [facility];  Service: Gastroenterology;  Laterality: N/A;   Allergies  Allergen Reactions   Lyrica [Pregabalin] Anxiety   Prior to Admission medications   Medication Sig Start Date End Date Taking? Authorizing Provider  amLODipine (NORVASC) 10 MG tablet Take 1 tablet (10 mg total) by mouth daily. 01/03/21  Yes Ladell Pier, MD  aspirin 81 MG EC tablet Take 1 tablet (81 mg total) by mouth  daily. Swallow whole. 01/05/21  Yes Barb Merino, MD  atorvastatin (LIPITOR) 40 MG tablet Take 1.5 tablets (60 mg total) by mouth daily. 03/09/21  Yes Ladell Pier, MD  clotrimazole-betamethasone (LOTRISONE) cream APPLY 1 APPLICATION TOPICALLY 2 (TWO) TIMES DAILY. Patient taking differently: Apply 1 application topically 2 (two) times daily as needed (rash). 05/15/20 05/15/21 Yes Elsie Stain, MD  Fenofibrate 120 MG TABS Take 1 tablet (120 mg total) by mouth daily. 02/13/21  Yes Kroeger, Daleen Snook M., PA-C  losartan (COZAAR) 100 MG tablet Take 1 tablet (100 mg total) by mouth daily. 01/06/21  Yes Ladell Pier, MD  Magnesium 400 MG CAPS Take 600 mg by mouth daily.   Yes [provider]  omeprazole (PRILOSEC) 20 MG capsule Take 20 mg by mouth daily as needed (indigestion).   Yes [provider]  sertraline (ZOLOFT) 100 MG tablet TAKE 1.5 TABLETS (150 MG TOTAL) BY MOUTH DAILY. 03/22/20 04/13/21 Yes Pucilowski, Olgierd A, MD  flecainide (TAMBOCOR) 50 MG tablet Take 1 tablet (50 mg total) by mouth 2 (two) times daily. Patient not taking: Reported on 03/28/2021 03/21/21   Evans Lance, MD   Social History   Socioeconomic History   Marital status: Single    Spouse  name: Not on file   Number of children: 0   Years of education: Not on file   Highest education level: Not on file  Occupational History   Not on file  Tobacco Use   Smoking status: Former    Packs/day: 1.50    Years: 26.00    Pack years: 39.00    Types: E-cigarettes, Cigarettes    Start date: 1989    Quit date: 02/16/2014    Years since quitting: 7.1   Smokeless tobacco: Never   Tobacco comments:    occasional vapes - no nicotine  Vaping Use   Vaping Use: Every day   Start date: 04/15/2015   Last attempt to quit: 04/11/2019   Substances: Nicotine  Substance and Sexual Activity   Alcohol use: Not Currently    Comment: None 03/28/21, 03/02/2014 "I rarely drink"   Drug use: No   Sexual activity: Not Currently  Other Topics Concern   Not on file  Social History Narrative   Pt lives in 1 story home with his mother and step father   9th grade education   Last occupation was with [employer] in 2009      Unemployed at this time- thinking of filing for disability      Right handed      Social Determinants of Health   Financial Resource Strain: Not on file  Food Insecurity: Not on file  Transportation Needs: Not on file  Physical Activity: Not on file  Stress: Not on file  Social Connections: Not on file  Intimate Partner Violence: Not on file    Review of Systems  Constitutional:  Negative for fatigue and unexpected weight change.  Eyes:  Negative for visual disturbance.  Respiratory:  Negative for cough, chest tightness and shortness of breath.   Cardiovascular:  Negative for chest pain, palpitations (less recently) and leg swelling.  Gastrointestinal:  Negative for abdominal pain and blood in stool.  Neurological:  Negative for dizziness, light-headedness and headaches.    Objective:   Vitals:   03/28/21 0911  BP: 130/72  Pulse: 77  Resp: 17  Temp: 98.2 F (36.8 C)  TempSrc: Temporal  SpO2: 97%  Weight: (!) 314 lb 12.8 oz (142.8 kg)  Height:  5\' 10"  (1.778  m)     Physical Exam Vitals reviewed.  Constitutional:      Appearance: He is well-developed. He is obese.  HENT:     Head: Normocephalic and atraumatic.  Neck:     Vascular: No carotid bruit or JVD.  Cardiovascular:     Rate and Rhythm: Normal rate and regular rhythm.     Heart sounds: Normal heart sounds. No murmur heard. Pulmonary:     Effort: Pulmonary effort is normal.     Breath sounds: Normal breath sounds. No rales.  Musculoskeletal:     Right lower leg: No edema.     Left lower leg: No edema.  Skin:    General: Skin is warm and dry.  Neurological:     Mental Status: He is alert and oriented to person, place, and time.  Psychiatric:        Mood and Affect: Mood normal.        Behavior: Behavior normal.        Thought Content: Thought content normal.       Assessment & Plan:  Eber Ferrufino is a 50 y.o. male . Type 2 diabetes mellitus with diabetic polyneuropathy, without long-term current use of insulin ([place]) - Plan: Hemoglobin A1c  -Check updated labs.  Could consider GLP-1 based on A1c to help with diabetic control as well as with weight.  Also could consider meeting with weight management.  For now did recommend increased activity/walking, other exercise to be determined by his cardiologist.  Continue to watch diet, portions.  Need for influenza vaccination - Plan: Flu Vaccine QUAD 6+ mos PF IM (Fluarix Quad PF)  Mild depression - Plan: sertraline (ZOLOFT) 100 MG tablet GAD (generalized anxiety disorder)  -Reports overall stable symptoms.  Describes option of counseling.  We will continue same dose Zoloft, but consider follow-up with psychiatry if adjustments needed.  Handout given on anxiety management.  Recheck next month to review labs.  Idiopathic peripheral neuropathy  -Stable, no current meds.  OSA (obstructive sleep apnea)  -Now on BiPAP Consistently.  Continue same.  Other hyperlipidemia - Plan: Lipid panel, Comprehensive metabolic panel  -Continue  same regimen, check updated labs.  Palpitations  -Improved, and off flecainide at this time.  Continue follow-up with electrophysiology/cardiology including to discuss medication if symptoms increase.  Meds ordered this encounter  Medications   sertraline (ZOLOFT) 100 MG tablet    Sig: TAKE 1.5 TABLETS (150 MG TOTAL) BY MOUTH DAILY.    Dispense:  45 tablet    Refill:  11   Patient Instructions  I would discuss flecainide need and palpitations with your cardiologist and electrophysiologist. Walking is likely ok for exercise - ask cardiology about type of exercise. Watch portion sizes for diet and follow up in 1 month to discuss labs and med options.   If worsening anxiety would consider higher dose zoloft. See info below and let me know if you would like to meet with a therapist.   I do recommend new covid vaccine booster.   Nice meeting you today.  Recheck in 1 month.  Let me know if there are questions sooner.  Managing Anxiety, Adult After being diagnosed with anxiety, you may be relieved to know why you have felt or behaved a certain way. You may also feel overwhelmed about the treatment ahead and what it will mean for your life. With care and support, you can manage this condition. How to manage lifestyle changes Managing stress and anxiety Stress  is your body's reaction to life changes and events, both good and bad. Most stress will last just a few hours, but stress can be ongoing and can lead to more than just stress. Although stress can play a major role in anxiety, it is not the same as anxiety. Stress is usually caused by something external, such as a deadline, test, or competition. Stress normally passes after the triggering event has ended.  Anxiety is caused by something internal, such as imagining a terrible outcome or worrying that something will go wrong that will devastate you. Anxiety often does not go away even after the triggering event is over, and it can become  long-term (chronic) worry. It is important to understand the differences between stress and anxiety and to manage your stress effectively so that it does not lead to an anxious response. Talk with your health care provider or a counselor to learn more about reducing anxiety and stress. He or she may suggest tension reduction techniques, such as: Music therapy. Spend time creating or listening to music that you enjoy and that inspires you. Mindfulness-based meditation. Practice being aware of your normal breaths while not trying to control your breathing. It can be done while sitting or walking. Centering prayer. This involves focusing on a word, phrase, or sacred image that means something to you and brings you peace. Deep breathing. To do this, expand your stomach and inhale slowly through your nose. Hold your breath for 3-5 seconds. Then exhale slowly, letting your stomach muscles relax. Self-talk. Learn to notice and identify thought patterns that lead to anxiety reactions and change those patterns to thoughts that feel peaceful. Muscle relaxation. Taking time to tense muscles and then relax them. Choose a tension reduction technique that fits your lifestyle and personality. These techniques take time and practice. Set aside 5-15 minutes a day to do them. Therapists can offer counseling and training in these techniques. The training to help with anxiety may be covered by some insurance plans. Other things you can do to manage stress and anxiety include: Keeping a stress diary. This can help you learn what triggers your reaction and then learn ways to manage your response. Thinking about how you react to certain situations. You may not be able to control everything, but you can control your response. Making time for activities that help you relax and not feeling guilty about spending your time in this way. Doing visual imagery. This involves imagining or creating mental pictures to help you  relax. Practicing yoga. Through yoga poses, you can lower tension and promote relaxation.  Medicines Medicines can help ease symptoms. Medicines for anxiety include: Antidepressant medicines. These are usually prescribed for long-term daily control. Anti-anxiety medicines. These may be added in severe cases, especially when panic attacks occur. Medicines will be prescribed by a health care provider. When used together, medicines, psychotherapy, and tension reduction techniques may be the most effective treatment. Relationships Relationships can play a big part in helping you recover. Try to spend more time connecting with trusted friends and family members. Consider going to couples counseling if you have a partner, taking family education classes, or going to family therapy. Therapy can help you and others better understand your condition. How to recognize changes in your anxiety Everyone responds differently to treatment for anxiety. Recovery from anxiety happens when symptoms decrease and stop interfering with your daily activities at home or work. This may mean that you will start to: Have better concentration and focus. Worry will  interfere less in your daily thinking. Sleep better. Be less irritable. Have more energy. Have improved memory. It is also important to recognize when your condition is getting worse. Contact your health care provider if your symptoms interfere with home or work and you feel like your condition is not improving. Follow these instructions at home: Activity Exercise. Adults should do the following: Exercise for at least 150 minutes each week. The exercise should increase your heart rate and make you sweat (moderate-intensity exercise). Strengthening exercises at least twice a week. Get the right amount and quality of sleep. Most adults need 7-9 hours of sleep each night. Lifestyle  Eat a healthy diet that includes plenty of vegetables, fruits, whole grains,  low-fat dairy products, and lean protein. Do not eat a lot of foods that are high in fats, added sugars, or salt (sodium). Make choices that simplify your life. Do not use any products that contain nicotine or tobacco. These products include cigarettes, chewing tobacco, and vaping devices, such as e-cigarettes. If you need help quitting, ask your health care provider. Avoid caffeine, alcohol, and certain over-the-counter cold medicines. These may make you feel worse. Ask your pharmacist which medicines to avoid. General instructions Take over-the-counter and prescription medicines only as told by your health care provider. Keep all follow-up visits. This is important. Where to find support You can get help and support from these sources: Self-help groups. Online and [employer]. A trusted spiritual leader. Couples counseling. Family education classes. Family therapy. Where to find more information You may find that joining a support group helps you deal with your anxiety. The following sources can help you locate counselors or support groups near you: [place]: www.mentalhealthamerica.net Anxiety and Depression Association of [place] (ADAA): https://www.clark.net/ National Alliance on Mental Illness (NAMI): www.nami.org Contact a health care provider if: You have a hard time staying focused or finishing daily tasks. You spend many hours a day feeling worried about everyday life. You become exhausted by worry. You start to have headaches or frequently feel tense. You develop chronic nausea or diarrhea. Get help right away if: You have a racing heart and shortness of breath. You have thoughts of hurting yourself or others. If you ever feel like you may hurt yourself or others, or have thoughts about taking your own life, get help right away. Go to your nearest emergency department or: Call your local emergency services (911 in the [place] Call a suicide crisis helpline,  such as the [place] at 828-657-4442 or 988 in the [place]. This is open 24 hours a day in the [place] Text the Crisis Text Line at 312-256-1292 (in the [place].). Summary Taking steps to learn and use tension reduction techniques can help calm you and help prevent triggering an anxiety reaction. When used together, medicines, psychotherapy, and tension reduction techniques may be the most effective treatment. Family, friends, and partners can play a big part in supporting you. This information is not intended to replace advice given to you by your health care provider. Make sure you discuss any questions you have with your health care provider. Document Revised: 10/24/2020 Document Reviewed: 07/22/2020 Elsevier Patient Education  2022 [place],   Merri Ray, MD [place], [facility] 03/28/21 10:24 AM

## 2021-04-01 ENCOUNTER — Other Ambulatory Visit: Payer: Self-pay

## 2021-04-05 ENCOUNTER — Ambulatory Visit: Payer: No Typology Code available for payment source

## 2021-04-05 ENCOUNTER — Other Ambulatory Visit: Payer: Self-pay

## 2021-04-10 ENCOUNTER — Encounter: Payer: Self-pay | Admitting: Family Medicine

## 2021-04-11 NOTE — Telephone Encounter (Signed)
I do not see that we discussed these symptoms at his last visit.  Please schedule appointment with any provider as it appears my schedule is full today.  Thanks.

## 2021-04-15 ENCOUNTER — Other Ambulatory Visit: Payer: Self-pay

## 2021-04-15 NOTE — Progress Notes (Signed)
Cardiology Office Note:    Date:  04/16/2021   ID:  Juan Kline, DOB 1971-03-12, MRN [id number]  PCP:  Wendie Agreste, MD   [facility] Prov[facility] Cardiologist:  Pixie Casino, MD { Referring MD: Ladell Pier, MD   Chief Complaint  Patient presents with   Follow-up    PVCs    History of Present Illness:    Juan Kline is a 51 y.o. male with a hx of HTN, HLD, OSA, COPD partial compliance with BiPAP, DM2, peripheral neuropathy, obesity, venous stasis ulcers, aortic atherosclerosis by CT, former alcohol abuse now abstinent, and former smoker. He has a history of nonischemic ETT in 2018. Echocardiogram in Oct 2020 with LVEF 60-65%, mildly increased LVH, grade 2 DD, and no significant valvular disease. He was hospitalized 12/2020 with DOE and fatigue along with atypical CP. He ruled out with negative troponins. EKG was nonischemic. Echo with normal LVEF and no RWMA. He also reported heart racing. Telemetry was unrevealing aside from rare PVCs. He was seen for post-hospital follow up and by our office on two additional occasions for palpitations. Heart monitor revealed 4% PVCs. He failed a trial of low dose BB - HR reduced to 67 with no improvement in PVCs. He has not had syncope. He was seen by EP 03/21/21 and started on low dose flecainide.   He presents for follow up and EKG after flecainide initiation. He reports some improvement with flecainide when at rest, but is still having significant symptoms and dyspnea with any exertion. He states he is unable to work and is gaining weight. For example, he took the trash to the curb today and was extremely dyspneic with heart racing and palpitations.   EKG today showed PR interval increased from 306 ms to 372 ms. I was able to review case with Dr. Lovena Le. Flecainide could be contributing to his increased PR interval. Since he is not having relief on flecainide, I will stop this. He states he had a reduction in PVCs before starting the  flecainide, so is unsure if he is getting much benefit. He does not drink caffeine and is compliant on CPAP but unclear if he is getting max therapeutic effects from CPAP due to mask not fitting properly. He reports significant daytime fatigue and sleeps most of the day. He has gained weight due to sedentary lifestyle and inability to exercise.    Past Medical History:  Diagnosis Date   Aortic atherosclerosis ([place])    Chronic foot ulcer ([place]) admitted 03/02/2014   medial border first metatarsal left foot  - resolved per patient 03/20/20   Diabetes ([place])    type 2 - no meds, diet controlled   Diabetic foot infection ([place])    First degree AV block    GAD (generalized anxiety disorder)    GERD (gastroesophageal reflux disease)    Hx of migraines    Hyperlipidemia    Hypertension Dx 2009   Mild depression    Osteomyelitis left first metatarsal 03/02/2014   Sleep apnea    not using CPAP- machine being looked at for repair 03/21/20    Venous stasis ulcer of left lower extremity ([place])    [name] 03/02/2014 - resolved per patient 03/20/20    Past Surgical History:  Procedure Laterality Date   BIOPSY  08/18/2019   Procedure: BIOPSY;  Surgeon: Thornton Park, MD;  Location: [facility];  Service: Gastroenterology;;   ESOPHAGOGASTRODUODENOSCOPY (EGD) WITH PROPOFOL N/A 08/18/2019   Procedure: ESOPHAGOGASTRODUODENOSCOPY (EGD) WITH PROPOFOL;  Surgeon: Thornton Park, MD;  Location: [facility];  Service: Gastroenterology;  Laterality: N/A;    Current Medications: Current Meds  Medication Sig   amLODipine (NORVASC) 10 MG tablet Take 1 tablet (10 mg total) by mouth daily.   aspirin 81 MG EC tablet Take 1 tablet (81 mg total) by mouth daily. Swallow whole.   atorvastatin (LIPITOR) 40 MG tablet Take 1.5 tablets (60 mg total) by mouth daily.   clotrimazole-betamethasone (LOTRISONE) cream APPLY 1 APPLICATION TOPICALLY 2 (TWO) TIMES DAILY. (Patient taking differently: Apply 1 application  topically 2 (two) times daily as needed (rash).)   Fenofibrate 120 MG TABS Take 1 tablet (120 mg total) by mouth daily.   flecainide (TAMBOCOR) 50 MG tablet Take 1 tablet (50 mg total) by mouth 2 (two) times daily.   losartan (COZAAR) 100 MG tablet Take 1 tablet (100 mg total) by mouth daily.   Magnesium 400 MG CAPS Take 600 mg by mouth daily.   omeprazole (PRILOSEC) 20 MG capsule Take 20 mg by mouth daily as needed (indigestion).   sertraline (ZOLOFT) 100 MG tablet TAKE 1.5 TABLETS (150 MG TOTAL) BY MOUTH DAILY.     Allergies:   Lyrica [pregabalin]   Social History   Socioeconomic History   Marital status: Single    Spouse name: Not on file   Number of children: 0   Years of education: Not on file   Highest education level: Not on file  Occupational History   Not on file  Tobacco Use   Smoking status: Former    Packs/day: 1.50    Years: 26.00    Pack years: 39.00    Types: E-cigarettes, Cigarettes    Start date: 1989    Quit date: 02/16/2014    Years since quitting: 7.1   Smokeless tobacco: Never   Tobacco comments:    occasional vapes - no nicotine  Vaping Use   Vaping Use: Every day   Start date: 04/15/2015   Last attempt to quit: 04/11/2019   Substances: Nicotine  Substance and Sexual Activity   Alcohol use: Not Currently    Comment: None 03/28/21, 03/02/2014 "I rarely drink"   Drug use: No   Sexual activity: Not Currently  Other Topics Concern   Not on file  Social History Narrative   Pt lives in 1 story home with his mother and step father   9th grade education   Last occupation was with [employer] in 2009      Unemployed at this time- thinking of filing for disability      Right handed      Social Determinants of Health   Financial Resource Strain: Not on file  Food Insecurity: Not on file  Transportation Needs: Not on file  Physical Activity: Not on file  Stress: Not on file  Social Connections: Not on file     Family History: The patient's afamily  history includes COPD in his maternal grandmother; Diabetes in his brother, maternal grandfather, and mother; Heart attack in his paternal grandmother; Heart disease in his maternal grandmother and paternal grandfather; Hyperlipidemia in his brother, father, and mother; Hypertension in his brother, father, and mother; Kidney disease in his mother. There is no history of Colon cancer, Colon polyps, Stomach cancer, Esophageal cancer, or Ulcerative colitis.  ROS:   Please see the history of present illness.     All other systems reviewed and are negative.  EKGs/Labs/Other Studies Reviewed:    The following studies were reviewed today:  Heart monitor  02/06/21: Monitor shows sinus rhythm with 1st degree AVB. PVC's were noted at a frequency of about 3.5% during the monitoring period. Ventricular bigeminy and trigeminy were noted.  No afib, NSVT or high degree heart block was noted.  EKG:  EKG is  ordered today.  The ekg ordered today demonstrates sinus rhythm with first degree heart bloc, PR interval 372 ms, left axis deviation  Recent Labs: 01/04/2021: B Natriuretic Peptide 14.8; TSH 0.944 01/05/2021: Hemoglobin 13.7; Platelets 239 02/13/2021: Magnesium 1.8 03/28/2021: ALT 23; BUN 29; Creatinine, Ser 1.01; Potassium 4.4; Sodium 138  Recent Lipid Panel    Component Value Date/Time   CHOL 168 03/28/2021 1031   CHOL 183 08/27/2020 1436   TRIG 179.0 (H) 03/28/2021 1031   HDL 43.80 03/28/2021 1031   HDL 46 08/27/2020 1436   CHOLHDL 4 03/28/2021 1031   VLDL 35.8 03/28/2021 1031   LDLCALC 89 03/28/2021 1031   LDLCALC 93 08/27/2020 1436     Risk Assessment/Calculations:           Physical Exam:    VS:  BP 130/78    Pulse (!) 104    Ht 5\' 9"  (1.753 m)    Wt (!) 323 lb (146.5 kg)    SpO2 98%    BMI 47.70 kg/m     Wt Readings from Last 3 Encounters:  04/16/21 (!) 323 lb (146.5 kg)  03/28/21 (!) 314 lb 12.8 oz (142.8 kg)  03/21/21 (!) 316 lb (143.3 kg)     GEN:  obese male in  NAD HEENT: Normal NECK: No JVD; No carotid bruits LYMPHATICS: No lymphadenopathy CARDIAC: RRR, no murmurs, rubs, gallops RESPIRATORY:  Clear to auscultation without rales, wheezing or rhonchi  ABDOMEN: Soft, non-tender, non-distended MUSCULOSKELETAL:  No edema; No deformity  SKIN: Warm and dry NEUROLOGIC:  Alert and oriented x 3 PSYCHIATRIC:  Normal affect   ASSESSMENT:    1. PVC's (premature ventricular contractions)   2. Primary hypertension   3. Atypical chest pain   4. Hyperlipidemia, unspecified hyperlipidemia type   5. OSA (obstructive sleep apnea)   6. Morbid obesity ([place])   7. Type 2 diabetes mellitus with diabetic polyneuropathy, without long-term current use of insulin (HCC)    PLAN:    In order of problems listed above:  Symptomatic PVCs - not relieved by low dose BB with reduction in HR - started on 50 mg flecainide BID with little relief but increase in PR interval - case discussed with Dr. Lovena Le who agrees with stopping flecainide if no benefit - he is not having enough PVC burden for an ablation This is a difficult situation and his options are limited. I think it will be important to lose weight and to make sure CPAP is working correctly. He will stop flecainide and monitor symptoms. If he has a significant increase in symptoms, he will restart flecainide and monitor.    Hypertension - maintained on losartan and amlodipine - BP controlled   Atypical chest pain - negative stress test in 2018 - most recent echo with preserved EF and no RWMA   Hyperlipidemia 01/05/2021: Cholesterol 151; HDL 31; LDL Cholesterol 60; Triglycerides 300; VLDL 60 03/28/2021: Cholesterol 168; HDL 43.80; LDL Cholesterol 89; Triglycerides 179.0; VLDL 35.8 Continue 40 mg lipitor.    DM2 A1c 6.6% - continue metformin   OSA compliant on BiPAP - managed by PCP, per patient - unclear if his CPAP is working well since he sleeps most of the day   Obesity - I have  encouraged  calorie restriction since he can't exercise or walk - I think weight loss will help symptoms   Follow up with Dr. Lovena Le in 2 months and Dr. Debara Pickett in 6 months. He will discuss CPAP settings with PCP in 2 weeks.     Medication Adjustments/Labs and Tests Ordered: Current medicines are reviewed at length with the patient today.  Concerns regarding medicines are outlined above.  Orders Placed This Encounter  Procedures   Basic metabolic panel   EKG 76-HYWV   No orders of the defined types were placed in this encounter.   Patient Instructions  Medication Instructions:  No changes *If you need a refill on your cardiac medications before your next appointment, please call your pharmacy*   Lab Work: BMET If you have labs (blood work) drawn today and your tests are completely normal, you will receive your results only by: [place] (if you have MyChart) OR A paper copy in the mail If you have any lab test that is abnormal or we need to change your treatment, we will call you to review the results.   Testing/Procedures: No Testing   Follow-Up: At [facility], you and your health needs are our priority.  As part of our continuing mission to provide you with exceptional heart care, we have created designated Provider Care Teams.  These Care Teams include your primary Cardiologist (physician) and Advanced Practice Providers (APPs -  Physician Assistants and Nurse Practitioners) who all work together to provide you with the care you need, when you need it.  We recommend signing up for the patient portal called "MyChart".  Sign up information is provided on this After Visit Summary.  MyChart is used to connect with patients for Virtual Visits (Telemedicine).  Patients are able to view lab/test results, encounter notes, upcoming appointments, etc.  Non-urgent messages can be sent to your provider as well.   To learn more about what you can do with MyChart, go to  [email or web address].    Your next appointment:   2 month(s)  The format for your next appointment:   In Person  Provider:   Cristopher Peru, MD        Signed, Tami Lin Jessika Rothery, PA  04/16/2021 1:21 PM    [place]

## 2021-04-16 ENCOUNTER — Other Ambulatory Visit: Payer: Self-pay

## 2021-04-16 ENCOUNTER — Encounter: Payer: Self-pay | Admitting: Physician Assistant

## 2021-04-16 ENCOUNTER — Ambulatory Visit (INDEPENDENT_AMBULATORY_CARE_PROVIDER_SITE_OTHER): Payer: No Typology Code available for payment source | Admitting: Physician Assistant

## 2021-04-16 VITALS — BP 130/78 | HR 104 | Ht 69.0 in | Wt 323.0 lb

## 2021-04-16 DIAGNOSIS — E1142 Type 2 diabetes mellitus with diabetic polyneuropathy: Secondary | ICD-10-CM

## 2021-04-16 DIAGNOSIS — I10 Essential (primary) hypertension: Secondary | ICD-10-CM

## 2021-04-16 DIAGNOSIS — E785 Hyperlipidemia, unspecified: Secondary | ICD-10-CM

## 2021-04-16 DIAGNOSIS — G4733 Obstructive sleep apnea (adult) (pediatric): Secondary | ICD-10-CM

## 2021-04-16 DIAGNOSIS — R0789 Other chest pain: Secondary | ICD-10-CM

## 2021-04-16 DIAGNOSIS — I493 Ventricular premature depolarization: Secondary | ICD-10-CM

## 2021-04-16 NOTE — Patient Instructions (Signed)
Medication Instructions:  No changes *If you need a refill on your cardiac medications before your next appointment, please call your pharmacy*   Lab Work: BMET If you have labs (blood work) drawn today and your tests are completely normal, you will receive your results only by: [place] (if you have MyChart) OR A paper copy in the mail If you have any lab test that is abnormal or we need to change your treatment, we will call you to review the results.   Testing/Procedures: No Testing   Follow-Up: At [facility], you and your health needs are our priority.  As part of our continuing mission to provide you with exceptional heart care, we have created designated Provider Care Teams.  These Care Teams include your primary Cardiologist (physician) and Advanced Practice Providers (APPs -  Physician Assistants and Nurse Practitioners) who all work together to provide you with the care you need, when you need it.  We recommend signing up for the patient portal called "MyChart".  Sign up information is provided on this After Visit Summary.  MyChart is used to connect with patients for Virtual Visits (Telemedicine).  Patients are able to view lab/test results, encounter notes, upcoming appointments, etc.  Non-urgent messages can be sent to your provider as well.   To learn more about what you can do with MyChart, go to [email or web address].    Your next appointment:   2 month(s)  The format for your next appointment:   In Person  Provider:   Cristopher Peru, MD

## 2021-04-17 ENCOUNTER — Encounter: Payer: Self-pay | Admitting: Internal Medicine

## 2021-04-17 LAB — BASIC METABOLIC PANEL
BUN/Creatinine Ratio: 34 — ABNORMAL HIGH (ref 9–20)
BUN: 30 mg/dL — ABNORMAL HIGH (ref 6–24)
CO2: 23 mmol/L (ref 20–29)
Calcium: 9.6 mg/dL (ref 8.7–10.2)
Chloride: 99 mmol/L (ref 96–106)
Creatinine, Ser: 0.88 mg/dL (ref 0.76–1.27)
Glucose: 139 mg/dL — ABNORMAL HIGH (ref 70–99)
Potassium: 4.7 mmol/L (ref 3.5–5.2)
Sodium: 140 mmol/L (ref 134–144)
eGFR: 105 mL/min/{1.73_m2} (ref >59)

## 2021-04-19 ENCOUNTER — Other Ambulatory Visit (HOSPITAL_COMMUNITY): Payer: Self-pay

## 2021-04-22 DIAGNOSIS — I10 Essential (primary) hypertension: Secondary | ICD-10-CM

## 2021-04-22 DIAGNOSIS — I493 Ventricular premature depolarization: Secondary | ICD-10-CM

## 2021-04-24 ENCOUNTER — Encounter: Payer: Self-pay | Admitting: Internal Medicine

## 2021-04-24 ENCOUNTER — Other Ambulatory Visit (HOSPITAL_COMMUNITY): Payer: Self-pay

## 2021-04-24 ENCOUNTER — Telehealth (HOSPITAL_COMMUNITY): Payer: Self-pay | Admitting: *Deleted

## 2021-04-24 NOTE — Telephone Encounter (Signed)
Close encounter 

## 2021-04-25 ENCOUNTER — Other Ambulatory Visit (HOSPITAL_COMMUNITY): Payer: Self-pay

## 2021-04-25 ENCOUNTER — Other Ambulatory Visit: Payer: Self-pay

## 2021-04-25 ENCOUNTER — Ambulatory Visit (HOSPITAL_COMMUNITY)
Admission: RE | Admit: 2021-04-25 | Discharge: 2021-04-25 | Disposition: A | Discharge status: Home / Self Care | Payer: No Typology Code available for payment source | Source: Ambulatory Visit | Attending: Cardiology | Admitting: Cardiology

## 2021-04-25 DIAGNOSIS — I493 Ventricular premature depolarization: Secondary | ICD-10-CM

## 2021-04-25 DIAGNOSIS — I10 Essential (primary) hypertension: Secondary | ICD-10-CM | POA: Insufficient documentation

## 2021-04-25 MED ORDER — AMINOPHYLLINE 25 MG/ML IV SOLN
75.0000 mg | Freq: Once | INTRAVENOUS | Status: AC
  Administered 2021-04-25: 75 mg via INTRAVENOUS

## 2021-04-25 MED ORDER — REGADENOSON 0.4 MG/5ML IV SOLN
0.4000 mg | Freq: Once | INTRAVENOUS | Status: AC
  Administered 2021-04-25: 0.4 mg via INTRAVENOUS

## 2021-04-25 MED ORDER — TECHNETIUM TC 99M TETROFOSMIN IV KIT
30.6000 | PACK | Freq: Once | INTRAVENOUS | Status: AC | PRN
  Administered 2021-04-25: 30.6 via INTRAVENOUS
  Filled 2021-04-25: qty 31

## 2021-04-26 ENCOUNTER — Ambulatory Visit (HOSPITAL_COMMUNITY)
Admission: RE | Admit: 2021-04-26 | Discharge: 2021-04-26 | Disposition: A | Discharge status: Home / Self Care | Payer: No Typology Code available for payment source | Source: Ambulatory Visit | Attending: Cardiology | Admitting: Cardiology

## 2021-04-26 ENCOUNTER — Inpatient Hospital Stay (HOSPITAL_COMMUNITY): Admission: RE | Admit: 2021-04-26 | Payer: No Typology Code available for payment source | Source: Ambulatory Visit

## 2021-04-26 LAB — MYOCARDIAL PERFUSION IMAGING
LV dias vol: 150 mL (ref 62–150)
LV sys vol: 68 mL
Nuc Stress EF: 55 %
Peak HR: 99 {beats}/min
Rest HR: 66 {beats}/min
Rest Nuclear Isotope Dose: 29.8 mCi
SDS: 0
SRS: 0
SSS: 0
ST Depression (mm): 0 mm
Stress Nuclear Isotope Dose: 30.6 mCi
TID: 1.01

## 2021-04-26 MED ORDER — TECHNETIUM TC 99M TETROFOSMIN IV KIT
29.8000 | PACK | Freq: Once | INTRAVENOUS | Status: AC | PRN
  Administered 2021-04-26: 29.8 via INTRAVENOUS

## 2021-04-29 ENCOUNTER — Encounter: Payer: Self-pay | Admitting: Internal Medicine

## 2021-04-29 ENCOUNTER — Encounter: Payer: Self-pay | Admitting: Family Medicine

## 2021-04-29 ENCOUNTER — Other Ambulatory Visit: Payer: Self-pay

## 2021-04-29 ENCOUNTER — Ambulatory Visit (INDEPENDENT_AMBULATORY_CARE_PROVIDER_SITE_OTHER): Payer: Self-pay | Admitting: Family Medicine

## 2021-04-29 VITALS — BP 130/78 | HR 75 | Temp 98.1°F | Resp 16 | Ht 69.0 in | Wt 326.6 lb

## 2021-04-29 DIAGNOSIS — Z6841 Body Mass Index (BMI) 40.0 and over, adult: Secondary | ICD-10-CM

## 2021-04-29 DIAGNOSIS — E1142 Type 2 diabetes mellitus with diabetic polyneuropathy: Secondary | ICD-10-CM

## 2021-04-29 DIAGNOSIS — F32A Depression, unspecified: Secondary | ICD-10-CM

## 2021-04-29 MED ORDER — OZEMPIC (0.25 OR 0.5 MG/DOSE) 2 MG/1.5ML ~~LOC~~ SOPN
0.2500 mg | PEN_INJECTOR | SUBCUTANEOUS | 0 refills | Status: DC
Start: 2021-04-29 — End: 2021-06-27
  Filled 2021-04-29: qty 1.5, 56d supply, fill #0
  Filled 2021-05-29 – 2021-06-04 (×2): qty 1.5, 56d supply, fill #1

## 2021-04-29 NOTE — Progress Notes (Addendum)
Subjective:  Patient ID: Juan Kline, male    DOB: [birth year]  Age: 51 y.o. MRN: [id number]  CC:  Chief Complaint  Patient presents with   Obesity    Pt here to discuss weight and weight management, lab work was improved last ov no questions from pt    Depression    Pt PHQ9 score 8     HPI Juan Kline presents for  Follow-up from establish care visit December 15.  Diabetes with polyneuropathy, obesity. Stable/improved A1c in December. And for increased activity/walking.  Option of meeting with weight management. No change in activity, walking. Limited by palpitations prior but improved no - less palpitations off flecainide - rare flutter. Low risk myocardial perfusion scan 1/12-1/13.  No regular exercise regimen, but slight increased activity.  Weight up form 314 last visit in 03/28/10.  Fast food: occasionally - once per week.  Sugar beverages: avoiding usually. Some dietary changes over the holidays. ? Portions or eating more often?  Not interested in surgical options d/t cost.  No personal hx of pancreatitis, no family history of MEN syndrome or medullary thyroid carcinoma.   Body mass index is 48.23 kg/m.  Wt Readings from Last 3 Encounters:  04/29/21 (!) 326 lb 9.6 oz (148.1 kg)  04/25/21 (!) 323 lb (146.5 kg)  04/16/21 (!) 323 lb (146.5 kg)    Lab Results  Component Value Date   HGBA1C 6.4 03/28/2021   HGBA1C 6.6 (H) 08/27/2020   HGBA1C 6.5 (H) 05/08/2020   Lab Results  Component Value Date   MICROALBUR 150 12/10/2016   LDLCALC 89 03/28/2021   CREATININE 0.88 04/16/2021    Anxiety, depression treated with Zoloft 150 mg daily.  Did note some anxiety with medical issues last visit, discussed option of counseling.  Option to meet with psychiatry if adjustments needed as previously treated by psychiatry. Doing ok - still just stress with health issue- declines counseling for now.  Depression screen [facility] 2/9 04/29/2021 03/28/2021 12/28/2020 08/27/2020 05/24/2020   Decreased Interest 1 1 2 3 2   Down, Depressed, Hopeless 0 1 3 3 1   PHQ - 2 Score 1 2 5 6 3   Altered sleeping 2 1 2  0 2  Tired, decreased energy 2 3 3 3 1   Change in appetite 2 2 1 2  0  Feeling bad or failure about yourself  0 0 0 1 0  Trouble concentrating 0 0 2 1 0  Moving slowly or fidgety/restless 0 0 0 0 0  Suicidal thoughts 1 0 1 1 0  PHQ-9 Score 8 8 14 14 6   Some recent data might be hidden        History Patient Active Problem List   Diagnosis Date Noted   Palpitations 03/21/2021   Chest pain 01/03/2021   Dermatitis 05/15/2020   Hx of migraine headaches 01/24/2020   Lipomatosis of subcutaneous tissue 12/08/2019   Tinnitus of both ears 11/08/2019   Abnormal MRI of the head 08/24/2019   Acute superficial gastritis without hemorrhage 08/24/2019   COPD mixed type ([place]) 04/04/2019   Positive for macroalbuminuria 03/06/2019   Vapes nicotine containing substance 03/03/2019   Class 3 severe obesity due to excess calories without serious comorbidity with body mass index (BMI) of 45.0 to 49.9 in adult [facility]) 03/03/2019   Hyperlipidemia associated with type 2 diabetes mellitus ([place]) 03/03/2019   Type 2 diabetes mellitus with diabetic polyneuropathy, without long-term current use of insulin ([place]) 04/13/2018   Mild depression 04/01/2018   OSA (obstructive  sleep apnea) 10/26/2017   Anxiety disorder 10/26/2017   Peripheral neuropathy 12/12/2016   GAD (generalized anxiety disorder) 05/11/2015   Former smoker 05/11/2015   Other hyperlipidemia 05/22/2014   Vitamin D insufficiency 05/22/2014   Chronic intractable headache 05/19/2014   Numbness and tingling of foot 05/19/2014   Venous stasis dermatitis of both lower extremities 05/19/2014   history of Hypertension 03/02/2014   Morbid obesity ([place]) 03/02/2014   Past Medical History:  Diagnosis Date   Aortic atherosclerosis ([place])    Chronic foot ulcer ([place]) admitted 03/02/2014   medial border first metatarsal left foot  -  resolved per patient 03/20/20   Diabetes ([place])    type 2 - no meds, diet controlled   Diabetic foot infection ([place])    First degree AV block    GAD (generalized anxiety disorder)    GERD (gastroesophageal reflux disease)    Hx of migraines    Hyperlipidemia    Hypertension Dx 2009   Mild depression    Osteomyelitis left first metatarsal 03/02/2014   Sleep apnea    not using CPAP- machine being looked at for repair 03/21/20    Venous stasis ulcer of left lower extremity ([place])    [name] 03/02/2014 - resolved per patient 03/20/20   Past Surgical History:  Procedure Laterality Date   BIOPSY  08/18/2019   Procedure: BIOPSY;  Surgeon: Thornton Park, MD;  Location: [facility];  Service: Gastroenterology;;   ESOPHAGOGASTRODUODENOSCOPY (EGD) WITH PROPOFOL N/A 08/18/2019   Procedure: ESOPHAGOGASTRODUODENOSCOPY (EGD) WITH PROPOFOL;  Surgeon: Thornton Park, MD;  Location: [facility];  Service: Gastroenterology;  Laterality: N/A;   Allergies  Allergen Reactions   Lyrica [Pregabalin] Anxiety   Prior to Admission medications   Medication Sig Start Date End Date Taking? Authorizing Provider  amLODipine (NORVASC) 10 MG tablet Take 1 tablet (10 mg total) by mouth daily. 01/03/21  Yes Ladell Pier, MD  aspirin 81 MG EC tablet Take 1 tablet (81 mg total) by mouth daily. Swallow whole. 01/05/21  Yes Barb Merino, MD  atorvastatin (LIPITOR) 40 MG tablet Take 1.5 tablets (60 mg total) by mouth daily. 03/09/21  Yes Ladell Pier, MD  Fenofibrate 120 MG TABS Take 1 tablet (120 mg total) by mouth daily. 02/13/21  Yes Kroeger, Daleen Snook M., PA-C  losartan (COZAAR) 100 MG tablet Take 1 tablet (100 mg total) by mouth daily. 01/06/21  Yes Ladell Pier, MD  Magnesium 400 MG CAPS Take 600 mg by mouth daily.   Yes [provider]  omeprazole (PRILOSEC) 20 MG capsule Take 20 mg by mouth daily as needed (indigestion).   Yes [provider]  sertraline (ZOLOFT) 100 MG tablet  TAKE 1.5 TABLETS (150 MG TOTAL) BY MOUTH DAILY. 03/28/21 03/28/22 Yes Wendie Agreste, MD  clotrimazole-betamethasone (LOTRISONE) cream APPLY 1 APPLICATION TOPICALLY 2 (TWO) TIMES DAILY. Patient taking differently: Apply 1 application topically 2 (two) times daily as needed (rash). 05/15/20 05/15/21  Elsie Stain, MD  flecainide (TAMBOCOR) 50 MG tablet Take 1 tablet (50 mg total) by mouth 2 (two) times daily. Patient not taking: Reported on 04/29/2021 03/21/21   Evans Lance, MD   Social History   Socioeconomic History   Marital status: Single    Spouse name: Not on file   Number of children: 0   Years of education: Not on file   Highest education level: Not on file  Occupational History   Not on file  Tobacco Use   Smoking status: Former  Packs/day: 1.50    Years: 26.00    Pack years: 39.00    Types: E-cigarettes, Cigarettes    Start date: 1989    Quit date: 02/16/2014    Years since quitting: 7.2   Smokeless tobacco: Never   Tobacco comments:    occasional vapes - no nicotine  Vaping Use   Vaping Use: Every day   Start date: 04/15/2015   Last attempt to quit: 04/11/2019   Substances: Nicotine  Substance and Sexual Activity   Alcohol use: Not Currently    Comment: None 03/28/21, 03/02/2014 "I rarely drink"   Drug use: No   Sexual activity: Not Currently  Other Topics Concern   Not on file  Social History Narrative   Pt lives in 1 story home with his mother and step father   9th grade education   Last occupation was with [employer] in 2009      Unemployed at this time- thinking of filing for disability      Right handed      Social Determinants of Health   Financial Resource Strain: Not on file  Food Insecurity: Not on file  Transportation Needs: Not on file  Physical Activity: Not on file  Stress: Not on file  Social Connections: Not on file  Intimate Partner Violence: Not on file    Review of Systems   Objective:   Vitals:   04/29/21 0957  BP:  130/78  Pulse: 75  Resp: 16  Temp: 98.1 F (36.7 C)  TempSrc: Temporal  SpO2: 97%  Weight: (!) 326 lb 9.6 oz (148.1 kg)  Height: 5\' 9"  (1.753 m)     Physical Exam Vitals reviewed.  Constitutional:      Appearance: He is well-developed. He is obese.  HENT:     Head: Normocephalic and atraumatic.  Neck:     Vascular: No carotid bruit or JVD.  Cardiovascular:     Rate and Rhythm: Normal rate and regular rhythm.     Heart sounds: Normal heart sounds. No murmur heard. Pulmonary:     Effort: Pulmonary effort is normal.     Breath sounds: Normal breath sounds. No rales.  Musculoskeletal:     Right lower leg: No edema.     Left lower leg: No edema.  Skin:    General: Skin is warm and dry.  Neurological:     Mental Status: He is alert and oriented to person, place, and time.  Psychiatric:        Mood and Affect: Mood normal.        Behavior: Behavior normal.        Thought Content: Thought content normal.       Assessment & Plan:  Juan Kline is a 51 y.o. male . Type 2 diabetes mellitus with diabetic polyneuropathy, without long-term current use of insulin ([place]) - Plan: Semaglutide,0.25 or 0.5MG /DOS, (OZEMPIC, 0.25 OR 0.5 MG/DOSE,) 2 MG/1.5ML SOPN, Amb ref to Medical Nutrition Therapy-MNT Class 3 severe obesity with serious comorbidity and body mass index (BMI) of 45.0 to 49.9 in adult, unspecified obesity type ([place]) - Plan: Semaglutide,0.25 or 0.5MG /DOS, (OZEMPIC, 0.25 OR 0.5 MG/DOSE,) 2 MG/1.5ML SOPN, Amb ref to Medical Nutrition Therapy-MNT  -A1c overall improved, but still some difficulty with weight loss, weight has increased since last labs.  Different options discussed.  Declined counseling at this time, but does agree to meet with nutritionist and stressed importance of going through his goals and needs at that visit.  - start low-dose Ozempic  with potential side effects discussed.  -Low intensity exercise discussed as cleared by cardiology.  Start with walking.  Goal  times discussed. -Phone number provided for medical weight loss specialist.  Declines referral to surgical weight loss specialist at this time.   Recheck 2 months  Mild depression  -Overall stable, declines change in medications or counseling at this time.  -addendum 05/02/21 - called regarding PHQ response with SI - fleeting thought if not around with his medical issue but no intent, plan or current suicidal ideation. RTC precautions, but also discussed calling [phone] if SI returns - agrees to do so.    Meds ordered this encounter  Medications   Semaglutide,0.25 or 0.5MG /DOS, (OZEMPIC, 0.25 OR 0.5 MG/DOSE,) 2 MG/1.5ML SOPN    Sig: Inject 0.25 mg into the skin once a week.    Dispense:  6 mL    Refill:  0   Patient Instructions  I would recommend meeting with a nutritionist and discuss your goals and structure needed.  Work on walking, low intensity exercise as cleared by cardiology. Goal of 150 minutes per week.  Can start ozempic if not too costly. Once per week. If any low blood sugars, be seen right away.  Recheck in 2 months - please let me know if there are questions sooner.   [facility]  Medical Weight Loss Management  -  this would be a good option - can check to see timing of their appointments and cost - here is their info.  [phone]  Semaglutide Injection What is this medication? SEMAGLUTIDE (SEM a GLOO tide) treats type 2 diabetes. It works by increasing insulin levels in your body, which decreases your blood sugar (glucose). It also reduces the amount of sugar released into the blood and slows down your digestion. It can also be used to lower the risk of heart attack and stroke in people with type 2 diabetes. Changes to diet and exercise are often combined with this medication. This medicine may be used for other purposes; ask your health care provider or pharmacist if you have questions. COMMON BRAND NAME(S): OZEMPIC What should I tell my care team before  I take this medication? They need to know if you have any of these conditions: Endocrine tumors (MEN 2) or if someone in your family had these tumors Eye disease, vision problems History of pancreatitis Kidney disease Stomach problems Thyroid cancer or if someone in your family had thyroid cancer An unusual or allergic reaction to semaglutide, other medications, foods, dyes, or preservatives Pregnant or trying to get pregnant Breast-feeding How should I use this medication? This medication is for injection under the skin of your upper leg (thigh), stomach area, or upper arm. It is given once every week (every 7 days). You will be taught how to prepare and give this medication. Use exactly as directed. Take your medication at regular intervals. Do not take it more often than directed. If you use this medication with insulin, you should inject this medication and the insulin separately. Do not mix them together. Do not give the injections right next to each other. Change (rotate) injection sites with each injection. It is important that you put your used needles and syringes in a special sharps container. Do not put them in a trash can. If you do not have a sharps container, call your pharmacist or care team to get one. A special MedGuide will be given to you by the pharmacist with each prescription and refill. Be  sure to read this information carefully each time. This medication comes with INSTRUCTIONS FOR USE. Ask your pharmacist for directions on how to use this medication. Read the information carefully. Talk to your pharmacist or care team if you have questions. Talk to your care team about the use of this medication in children. Special care may be needed. Overdosage: If you think you have taken too much of this medicine contact a poison control center or emergency room at once. NOTE: This medicine is only for you. Do not share this medicine with others. What if I miss a dose? If you miss a  dose, take it as soon as you can within 5 days after the missed dose. Then take your next dose at your regular weekly time. If it has been longer than 5 days after the missed dose, do not take the missed dose. Take the next dose at your regular time. Do not take double or extra doses. If you have questions about a missed dose, contact your care team for advice. What may interact with this medication? Other medications for diabetes Many medications may cause changes in blood sugar, these include: Alcohol containing beverages Antiviral medications for HIV or AIDS Aspirin and aspirin-like medications Certain medications for blood pressure, heart disease, irregular heart beat Chromium Diuretics Male hormones, such as estrogens or progestins, birth control pills Fenofibrate Gemfibrozil Isoniazid Lanreotide Male hormones or anabolic steroids MAOIs like Carbex, Eldepryl, Marplan, Nardil, and Parnate Medications for weight loss Medications for allergies, asthma, cold, or cough Medications for depression, anxiety, or psychotic disturbances Niacin Nicotine NSAIDs, medications for pain and inflammation, like ibuprofen or naproxen Octreotide Pasireotide Pentamidine Phenytoin Probenecid Quinolone antibiotics such as ciprofloxacin, levofloxacin, ofloxacin Some herbal dietary supplements Steroid medications such as prednisone or cortisone Sulfamethoxazole; trimethoprim Thyroid hormones Some medications can hide the warning symptoms of low blood sugar (hypoglycemia). You may need to monitor your blood sugar more closely if you are taking one of these medications. These include: Beta-blockers, often used for high blood pressure or heart problems (examples include atenolol, metoprolol, propranolol) Clonidine Guanethidine Reserpine This list may not describe all possible interactions. Give your health care provider a list of all the medicines, herbs, non-prescription drugs, or dietary  supplements you use. Also tell them if you smoke, drink alcohol, or use illegal drugs. Some items may interact with your medicine. What should I watch for while using this medication? Visit your care team for regular checks on your progress. Drink plenty of fluids while taking this medication. Check with your care team if you get an attack of severe diarrhea, nausea, and vomiting. The loss of too much body fluid can make it dangerous for you to take this medication. A test called the HbA1C (A1C) will be monitored. This is a simple blood test. It measures your blood sugar control over the last 2 to 3 months. You will receive this test every 3 to 6 months. Learn how to check your blood sugar. Learn the symptoms of low and high blood sugar and how to manage them. Always carry a quick-source of sugar with you in case you have symptoms of low blood sugar. Examples include hard sugar candy or glucose tablets. Make sure others know that you can choke if you eat or drink when you develop serious symptoms of low blood sugar, such as seizures or unconsciousness. They must get medical help at once. Tell your care team if you have high blood sugar. You might need to change the dose of  your medication. If you are sick or exercising more than usual, you might need to change the dose of your medication. Do not skip meals. Ask your care team if you should avoid alcohol. Many nonprescription cough and cold products contain sugar or alcohol. These can affect blood sugar. Pens should never be shared. Even if the needle is changed, sharing may result in passing of viruses like hepatitis or HIV. Wear a medical ID bracelet or chain, and carry a card that describes your disease and details of your medication and dosage times. Do not become pregnant while taking this medication. Women should inform their care team if they wish to become pregnant or think they might be pregnant. There is a potential for serious side effects to an  unborn child. Talk to your care team for more information. What side effects may I notice from receiving this medication? Side effects that you should report to your care team as soon as possible: Allergic reactions--skin rash, itching, hives, swelling of the face, lips, tongue, or throat Change in vision Dehydration--increased thirst, dry mouth, feeling faint or lightheaded, headache, dark yellow or brown urine Gallbladder problems--severe stomach pain, nausea, vomiting, fever Heart palpitations--rapid, pounding, or irregular heartbeat Kidney injury--decrease in the amount of urine, swelling of the ankles, hands, or feet Pancreatitis--severe stomach pain that spreads to your back or gets worse after eating or when touched, fever, nausea, vomiting Thyroid cancer--new mass or lump in the neck, pain or trouble swallowing, trouble breathing, hoarseness Side effects that usually do not require medical attention (report to your care team if they continue or are bothersome): Diarrhea Loss of appetite Nausea Stomach pain Vomiting This list may not describe all possible side effects. Call your doctor for medical advice about side effects. You may report side effects to FDA at 1-800-FDA-1088. Where should I keep my medication? Keep out of the reach of children. Store unopened pens in a refrigerator between 2 and 8 degrees C (36 and 46 degrees F). Do not freeze. Protect from light and heat. After you first use the pen, it can be stored for 56 days at room temperature between 15 and 30 degrees C (59 and 86 degrees F) or in a refrigerator. Throw away your used pen after 56 days or after the expiration date, whichever comes first. Do not store your pen with the needle attached. If the needle is left on, medication may leak from the pen. NOTE: This sheet is a summary. It may not cover all possible information. If you have questions about this medicine, talk to your doctor, pharmacist, or health care  provider.  2022 Elsevier/Gold Standard (2020-07-05 00:00:00)     Signed,   Merri Ray, MD [place], [facility] 04/29/21 10:52 AM

## 2021-04-29 NOTE — Patient Instructions (Addendum)
I would recommend meeting with a nutritionist and discuss your goals and structure needed.  Work on walking, low intensity exercise as cleared by cardiology. Goal of 150 minutes per week.  Can start ozempic if not too costly. Once per week. If any low blood sugars, be seen right away.  Recheck in 2 months - please let me know if there are questions sooner.   [facility]  [facility]  -  this would be a good option - can check to see timing of their appointments and cost - here is their info.  [phone]  Semaglutide Injection What is this medication? SEMAGLUTIDE (SEM a GLOO tide) treats type 2 diabetes. It works by increasing insulin levels in your body, which decreases your blood sugar (glucose). It also reduces the amount of sugar released into the blood and slows down your digestion. It can also be used to lower the risk of heart attack and stroke in people with type 2 diabetes. Changes to diet and exercise are often combined with this medication. This medicine may be used for other purposes; ask your health care provider or pharmacist if you have questions. COMMON BRAND NAME(S): OZEMPIC What should I tell my care team before I take this medication? They need to know if you have any of these conditions: Endocrine tumors (MEN 2) or if someone in your family had these tumors Eye disease, vision problems History of pancreatitis Kidney disease Stomach problems Thyroid cancer or if someone in your family had thyroid cancer An unusual or allergic reaction to semaglutide, other medications, foods, dyes, or preservatives Pregnant or trying to get pregnant Breast-feeding How should I use this medication? This medication is for injection under the skin of your upper leg (thigh), stomach area, or upper arm. It is given once every week (every 7 days). You will be taught how to prepare and give this medication. Use exactly as directed. Take your medication at  regular intervals. Do not take it more often than directed. If you use this medication with insulin, you should inject this medication and the insulin separately. Do not mix them together. Do not give the injections right next to each other. Change (rotate) injection sites with each injection. It is important that you put your used needles and syringes in a special sharps container. Do not put them in a trash can. If you do not have a sharps container, call your pharmacist or care team to get one. A special MedGuide will be given to you by the pharmacist with each prescription and refill. Be sure to read this information carefully each time. This medication comes with INSTRUCTIONS FOR USE. Ask your pharmacist for directions on how to use this medication. Read the information carefully. Talk to your pharmacist or care team if you have questions. Talk to your care team about the use of this medication in children. Special care may be needed. Overdosage: If you think you have taken too much of this medicine contact a poison control center or emergency room at once. NOTE: This medicine is only for you. Do not share this medicine with others. What if I miss a dose? If you miss a dose, take it as soon as you can within 5 days after the missed dose. Then take your next dose at your regular weekly time. If it has been longer than 5 days after the missed dose, do not take the missed dose. Take the next dose at your regular time. Do not  take double or extra doses. If you have questions about a missed dose, contact your care team for advice. What may interact with this medication? Other medications for diabetes Many medications may cause changes in blood sugar, these include: Alcohol containing beverages Antiviral medications for HIV or AIDS Aspirin and aspirin-like medications Certain medications for blood pressure, heart disease, irregular heart beat Chromium Diuretics Male hormones, such as estrogens or  progestins, birth control pills Fenofibrate Gemfibrozil Isoniazid Lanreotide Male hormones or anabolic steroids MAOIs like Carbex, Eldepryl, Marplan, Nardil, and Parnate Medications for weight loss Medications for allergies, asthma, cold, or cough Medications for depression, anxiety, or psychotic disturbances Niacin Nicotine NSAIDs, medications for pain and inflammation, like ibuprofen or naproxen Octreotide Pasireotide Pentamidine Phenytoin Probenecid Quinolone antibiotics such as ciprofloxacin, levofloxacin, ofloxacin Some herbal dietary supplements Steroid medications such as prednisone or cortisone Sulfamethoxazole; trimethoprim Thyroid hormones Some medications can hide the warning symptoms of low blood sugar (hypoglycemia). You may need to monitor your blood sugar more closely if you are taking one of these medications. These include: Beta-blockers, often used for high blood pressure or heart problems (examples include atenolol, metoprolol, propranolol) Clonidine Guanethidine Reserpine This list may not describe all possible interactions. Give your health care provider a list of all the medicines, herbs, non-prescription drugs, or dietary supplements you use. Also tell them if you smoke, drink alcohol, or use illegal drugs. Some items may interact with your medicine. What should I watch for while using this medication? Visit your care team for regular checks on your progress. Drink plenty of fluids while taking this medication. Check with your care team if you get an attack of severe diarrhea, nausea, and vomiting. The loss of too much body fluid can make it dangerous for you to take this medication. A test called the HbA1C (A1C) will be monitored. This is a simple blood test. It measures your blood sugar control over the last 2 to 3 months. You will receive this test every 3 to 6 months. Learn how to check your blood sugar. Learn the symptoms of low and high blood sugar and  how to manage them. Always carry a quick-source of sugar with you in case you have symptoms of low blood sugar. Examples include hard sugar candy or glucose tablets. Make sure others know that you can choke if you eat or drink when you develop serious symptoms of low blood sugar, such as seizures or unconsciousness. They must get medical help at once. Tell your care team if you have high blood sugar. You might need to change the dose of your medication. If you are sick or exercising more than usual, you might need to change the dose of your medication. Do not skip meals. Ask your care team if you should avoid alcohol. Many nonprescription cough and cold products contain sugar or alcohol. These can affect blood sugar. Pens should never be shared. Even if the needle is changed, sharing may result in passing of viruses like hepatitis or HIV. Wear a medical ID bracelet or chain, and carry a card that describes your disease and details of your medication and dosage times. Do not become pregnant while taking this medication. Women should inform their care team if they wish to become pregnant or think they might be pregnant. There is a potential for serious side effects to an unborn child. Talk to your care team for more information. What side effects may I notice from receiving this medication? Side effects that you should report to your  care team as soon as possible: Allergic reactions--skin rash, itching, hives, swelling of the face, lips, tongue, or throat Change in vision Dehydration--increased thirst, dry mouth, feeling faint or lightheaded, headache, dark yellow or brown urine Gallbladder problems--severe stomach pain, nausea, vomiting, fever Heart palpitations--rapid, pounding, or irregular heartbeat Kidney injury--decrease in the amount of urine, swelling of the ankles, hands, or feet Pancreatitis--severe stomach pain that spreads to your back or gets worse after eating or when touched, fever,  nausea, vomiting Thyroid cancer--new mass or lump in the neck, pain or trouble swallowing, trouble breathing, hoarseness Side effects that usually do not require medical attention (report to your care team if they continue or are bothersome): Diarrhea Loss of appetite Nausea Stomach pain Vomiting This list may not describe all possible side effects. Call your doctor for medical advice about side effects. You may report side effects to FDA at 1-800-FDA-1088. Where should I keep my medication? Keep out of the reach of children. Store unopened pens in a refrigerator between 2 and 8 degrees C (36 and 46 degrees F). Do not freeze. Protect from light and heat. After you first use the pen, it can be stored for 56 days at room temperature between 15 and 30 degrees C (59 and 86 degrees F) or in a refrigerator. Throw away your used pen after 56 days or after the expiration date, whichever comes first. Do not store your pen with the needle attached. If the needle is left on, medication may leak from the pen. NOTE: This sheet is a summary. It may not cover all possible information. If you have questions about this medicine, talk to your doctor, pharmacist, or health care provider.  2022 Elsevier/Gold Standard (2020-07-05 00:00:00)

## 2021-04-30 NOTE — Telephone Encounter (Signed)
Pt is asking if okay to donate plasma given his current medications, okay from Cardiology please advise

## 2021-05-01 ENCOUNTER — Other Ambulatory Visit: Payer: Self-pay

## 2021-05-01 ENCOUNTER — Other Ambulatory Visit (HOSPITAL_COMMUNITY): Payer: Self-pay

## 2021-05-02 ENCOUNTER — Other Ambulatory Visit: Payer: Self-pay

## 2021-05-02 ENCOUNTER — Ambulatory Visit: Payer: No Typology Code available for payment source | Admitting: Internal Medicine

## 2021-05-02 NOTE — Telephone Encounter (Signed)
Information reviewed regarding plasma donation. Called patient to review history.  History of hypertension, diabetes, venous stasis, PVCs with less symptoms off flecainide at his last visit.  Noted cardiology note from 3 days ago, stress test was low risk, should be safe to donate plasma from cardiac standpoint.   Discussed avoiding donating plasma if any signs or symptoms of active infection including fevers or if not feeling well, or if taking systemic antibiotics for an infection. HIV nonreactive 01/04/2021.  Hepatitis C antibody negative on 08/27/2020. No known increased risk factors for HIV. No known history of cancer. No current teratogenic medications. May need to hold aspirin 2 days based on center requirements.  History of COPD, PFTs in December 2020 indicating moderately severe obstructive airway disease with moderate restriction.  Treated with Spiriva by pulmonary with note in February 2021 indicating no significant obstruction on PFT with mild restriction - will defer to the pulmonary to clear from pulmonary perspective to donate plasma. Patient will call, but let me know if my help is needed further.  I do not see other specific restrictions to plasma donation at this time.   Lab Results  Component Value Date   WBC 8.1 01/05/2021   HGB 13.7 01/05/2021   HCT 41.1 01/05/2021   MCV 85.4 01/05/2021   PLT 239 01/05/2021

## 2021-05-06 ENCOUNTER — Other Ambulatory Visit: Payer: Self-pay

## 2021-05-06 ENCOUNTER — Telehealth: Payer: Self-pay | Admitting: Family Medicine

## 2021-05-06 NOTE — Telephone Encounter (Signed)
Copied from [place] [id number]. Topic: General - Other >> May 06, 2021  3:02 PM Tessa Lerner A wrote: Reason for CRM: The patient would like to be contacted by Clifton James when possible  The patient has recently changed providers and has additional questions related to financial assistance   Please contact further when available

## 2021-05-08 ENCOUNTER — Other Ambulatory Visit: Payer: Self-pay

## 2021-05-08 NOTE — Telephone Encounter (Signed)
I return Pt call, he was inform that he need to apply directly with [facility] for the financial program, since he now goes to a different clinic, sending an application by mail

## 2021-05-10 ENCOUNTER — Other Ambulatory Visit (HOSPITAL_COMMUNITY): Payer: Self-pay

## 2021-05-10 ENCOUNTER — Other Ambulatory Visit: Payer: Self-pay

## 2021-05-15 ENCOUNTER — Other Ambulatory Visit (HOSPITAL_COMMUNITY): Payer: Self-pay

## 2021-05-16 ENCOUNTER — Other Ambulatory Visit: Payer: Self-pay | Admitting: Family Medicine

## 2021-05-16 MED ORDER — OMEPRAZOLE 20 MG PO CPDR
20.0000 mg | DELAYED_RELEASE_CAPSULE | Freq: Every day | ORAL | 1 refills | Status: DC | PRN
  Filled 2021-05-16 – 2021-05-17 (×2): qty 90, 90d supply, fill #0

## 2021-05-16 NOTE — Telephone Encounter (Signed)
Patient is taking Omeprazole for indigestion but was prescribed by a historical Provider. Is this refill appropriate patient has an  upcoming appointment with you.

## 2021-05-17 ENCOUNTER — Other Ambulatory Visit: Payer: Self-pay

## 2021-05-17 ENCOUNTER — Other Ambulatory Visit (HOSPITAL_COMMUNITY): Payer: Self-pay

## 2021-05-18 ENCOUNTER — Encounter: Payer: Self-pay | Admitting: Family Medicine

## 2021-05-20 ENCOUNTER — Other Ambulatory Visit: Payer: Self-pay

## 2021-05-21 ENCOUNTER — Telehealth: Payer: Self-pay

## 2021-05-21 NOTE — Telephone Encounter (Signed)
Not sure what type of meter he has.  If he does have a meter can send in test strips for checking blood sugar once per day, can use previous diabetes diagnosis.  Let me know if further questions.  Thanks for the help.

## 2021-05-21 NOTE — Telephone Encounter (Signed)
Pt is asking about testing supplies, I do not see where he was previously requested to check sugar.   Please advise

## 2021-05-21 NOTE — Telephone Encounter (Signed)
Pt dropped off form to be completed and fax to [employer]

## 2021-05-22 ENCOUNTER — Other Ambulatory Visit: Payer: Self-pay

## 2021-05-22 MED ORDER — GLUCOSE BLOOD VI STRP
ORAL_STRIP | 12 refills | Status: DC
  Filled 2021-05-22: qty 100, 90d supply, fill #0
  Filled 2022-04-10: qty 50, 50d supply, fill #1

## 2021-05-22 NOTE — Telephone Encounter (Signed)
Filled out what I was able and placed in your to be signed folder

## 2021-05-23 ENCOUNTER — Other Ambulatory Visit: Payer: Self-pay

## 2021-05-23 NOTE — Telephone Encounter (Signed)
Form completed and placed in fax folder.

## 2021-05-30 ENCOUNTER — Other Ambulatory Visit (HOSPITAL_COMMUNITY): Payer: Self-pay

## 2021-06-04 ENCOUNTER — Other Ambulatory Visit (HOSPITAL_COMMUNITY): Payer: Self-pay

## 2021-06-04 ENCOUNTER — Other Ambulatory Visit: Payer: Self-pay

## 2021-06-06 ENCOUNTER — Encounter: Payer: Self-pay | Admitting: Family Medicine

## 2021-06-06 IMAGING — CT CT ABD-PELV W/ CM
2 of 5 series · 17 of 46 positions shown, 19 images · IV contrast (Omnipaque)
Comparison: None.

CLINICAL DATA: Upper abdominal pain and diarrhea

EXAM:
CT ABDOMEN AND PELVIS WITH CONTRAST
TECHNIQUE: Multidetector CT imaging of the abdomen and pelvis was performed
using the standard protocol following bolus administration of
intravenous contrast.
CONTRAST:  100mL OMNIPAQUE IOHEXOL 300 MG/ML  SOLN

[Series 2: axial st · axial · 0.92mm/px · z∈[+554,+1019]mm · 14 of 105 slices shown, 16 images]
[im 6/105  soft-tissue]
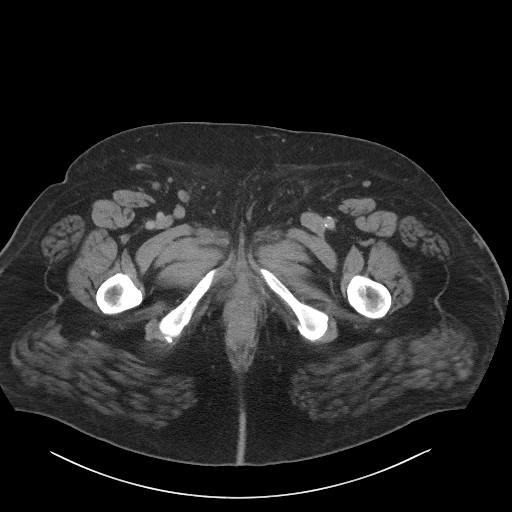
[im 6/105  bone]
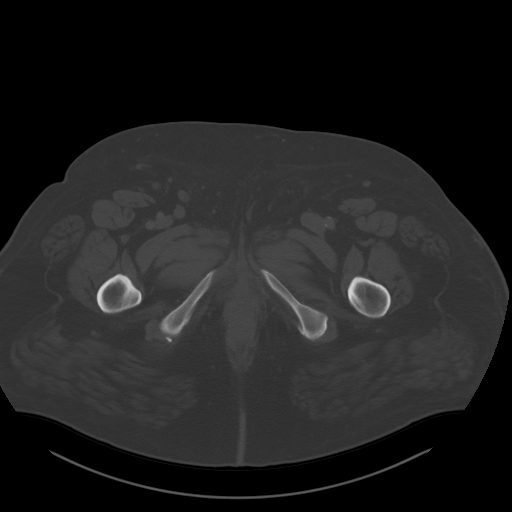
[im 11/105  soft-tissue]
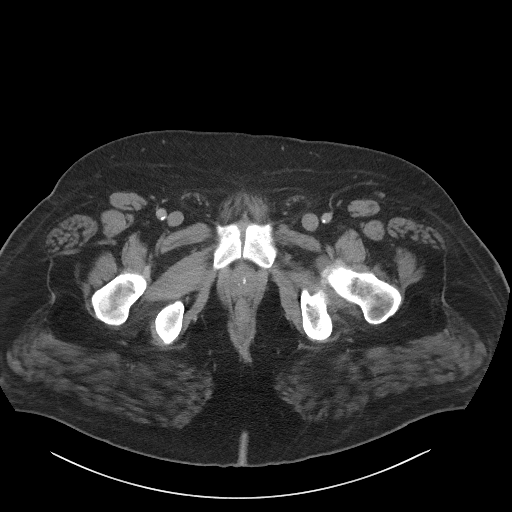
[im 22/105  soft-tissue]
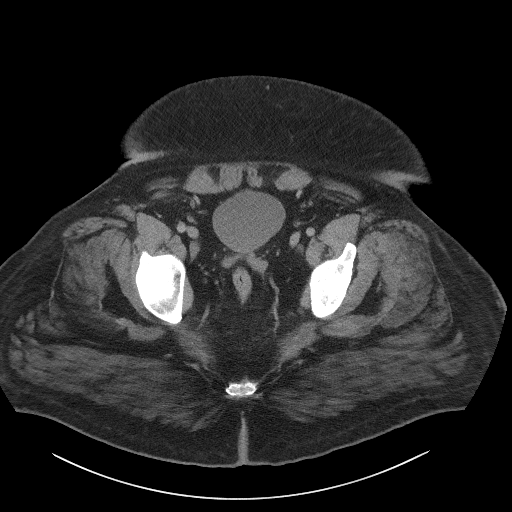
[im 28/105  soft-tissue]
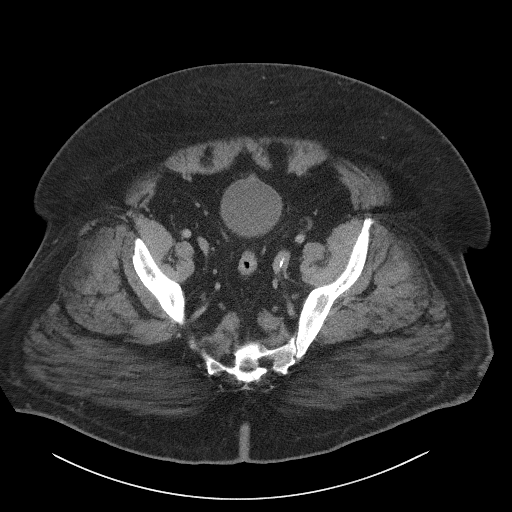
[im 33/105  soft-tissue]
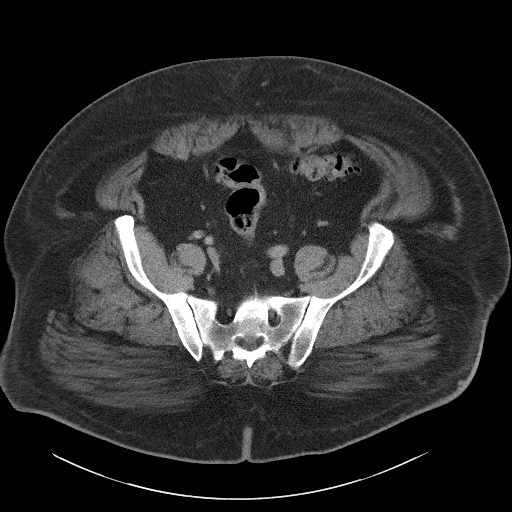
[im 44/105  soft-tissue]
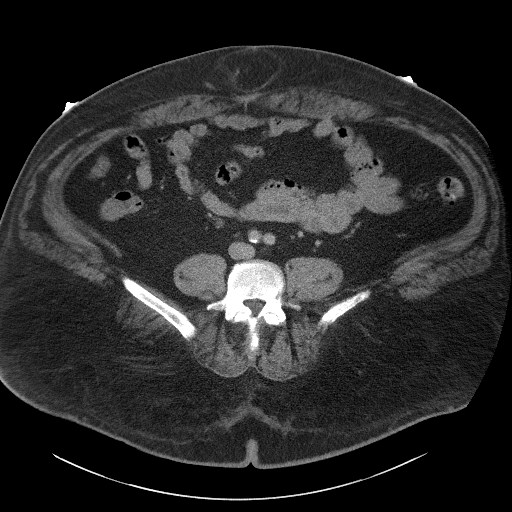
[im 50/105  soft-tissue]
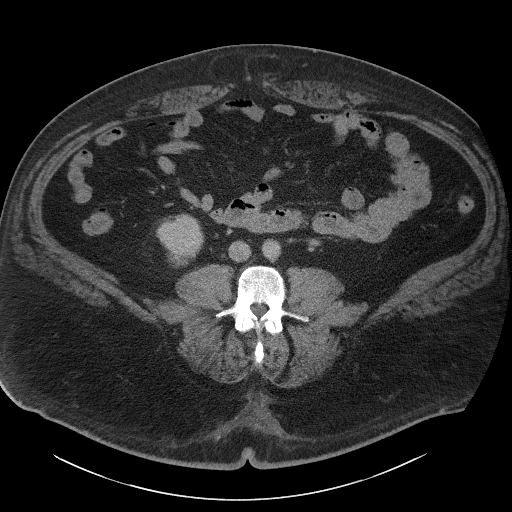
[im 55/105  soft-tissue]
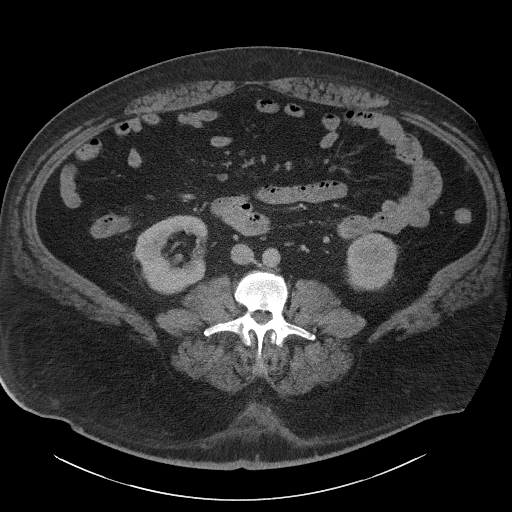
[im 61/105  soft-tissue]
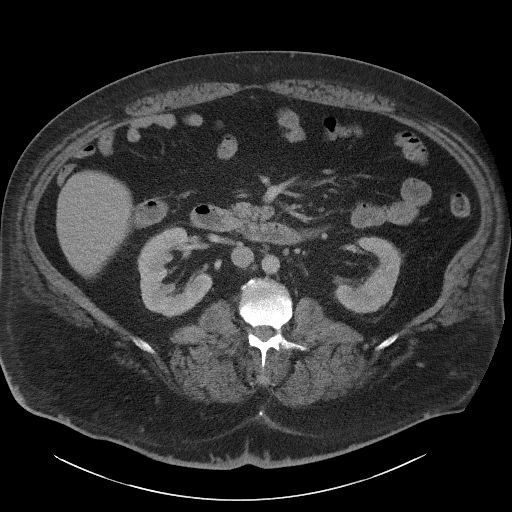
[im 61/105  bone]
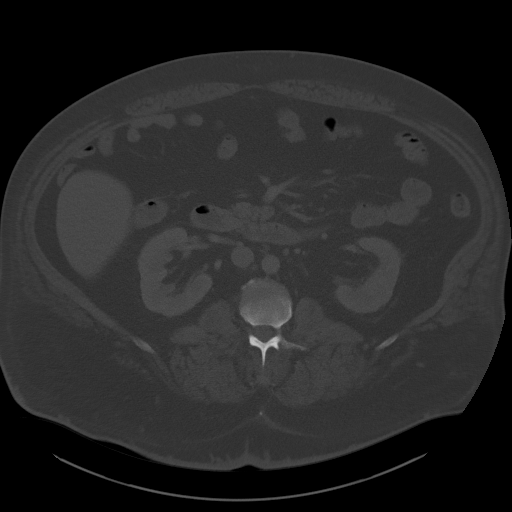
[im 72/105  soft-tissue]
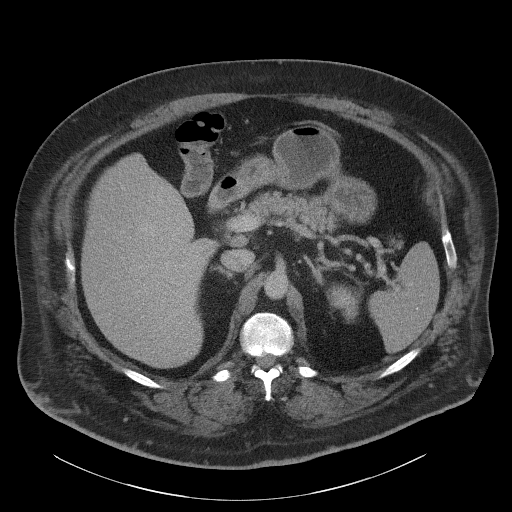
[im 77/105  soft-tissue]
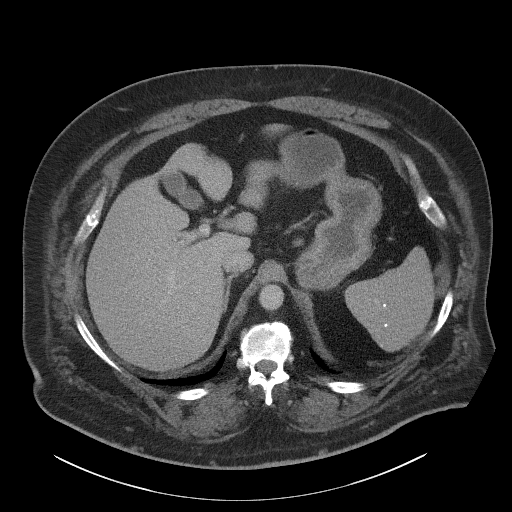
[im 83/105  soft-tissue]
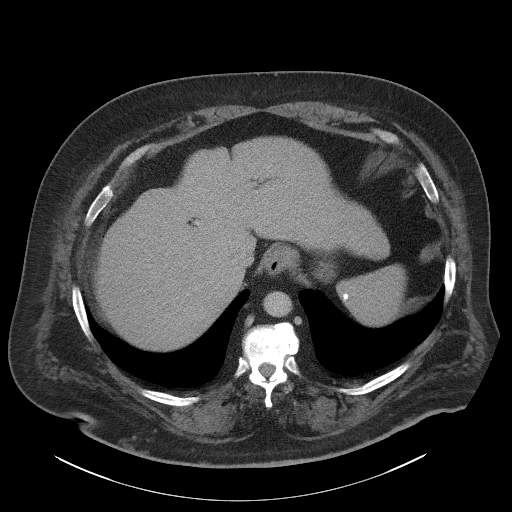
[im 94/105  soft-tissue]
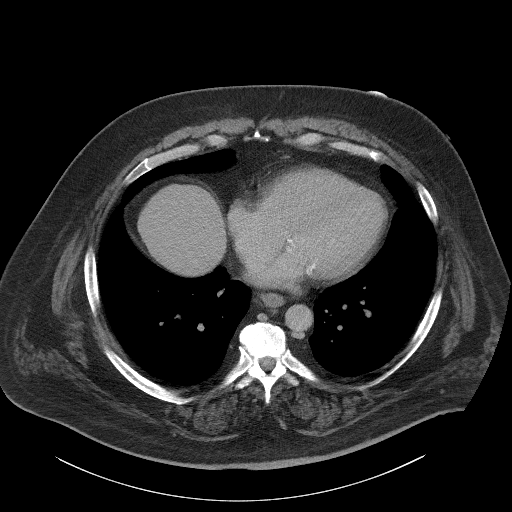
[im 99/105  soft-tissue]
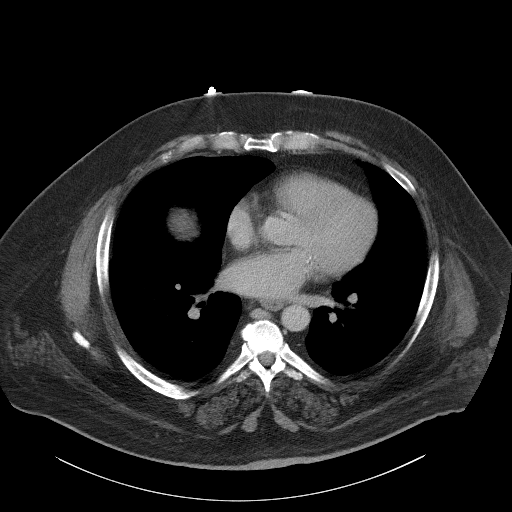

[Series 5: coronal st · coronal · 1.02mm/px · 3 of 119 slices shown]
[im 40/119  soft-tissue]
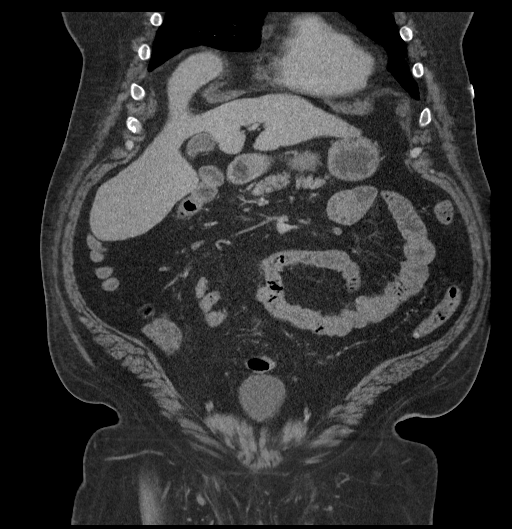
[im 53/119  soft-tissue]
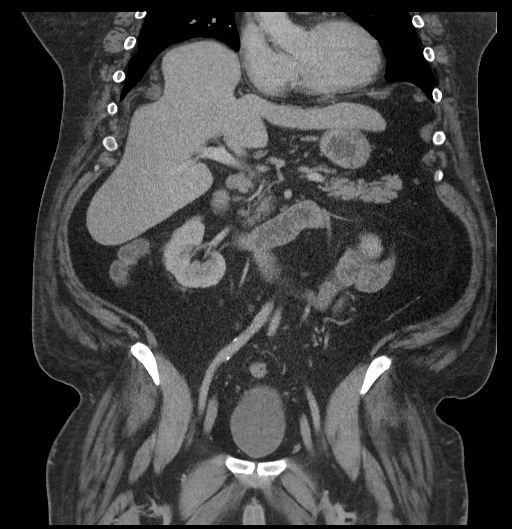
[im 66/119  soft-tissue]
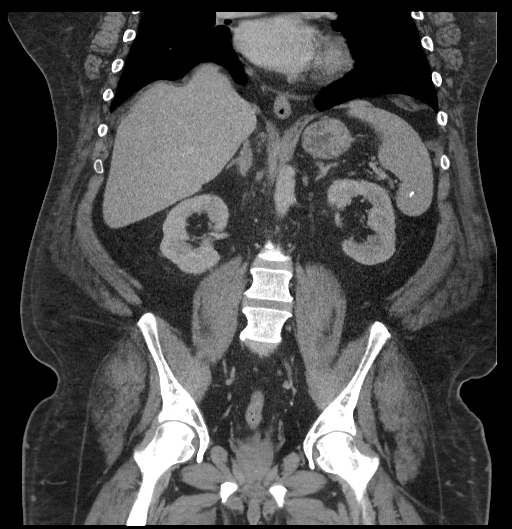

[17 of 46 positions shown; findings below may reference images not displayed]

FINDINGS: LOWER CHEST: Normal.

HEPATOBILIARY: Normal hepatic contours. No intra- or extrahepatic
biliary dilatation. The gallbladder is normal.

PANCREAS: Normal pancreas. No ductal dilatation or peripancreatic
fluid collection.

SPLEEN: Multiple calcified granulomata.

ADRENALS/URINARY TRACT: The adrenal glands are normal. No
hydronephrosis, nephroureterolithiasis or solid renal mass. The
urinary bladder is normal for degree of distention

STOMACH/BOWEL: There is no hiatal hernia. Normal duodenal course and
caliber. No small bowel dilatation or inflammation. No focal colonic
abnormality. The appendix is not visualized. No right lower quadrant
inflammation or free fluid.

VASCULAR/LYMPHATIC: There is calcific atherosclerosis of the
abdominal aorta. No abdominal or pelvic lymphadenopathy.

REPRODUCTIVE: There are calcifications within the normal-sized
prostate. Symmetric seminal vesicles.

MUSCULOSKELETAL. No bony spinal canal stenosis or focal osseous
abnormality.

OTHER: Fat containing ventral abdominal hernia.
IMPRESSION: 1. No acute abnormality of the abdomen or pelvis.
2. Fat containing ventral abdominal hernia.
3. Aortic Atherosclerosis ([id number]).

## 2021-06-06 NOTE — Telephone Encounter (Signed)
Noted. If he is not feeling well recommend visit to discuss further.

## 2021-06-06 NOTE — Telephone Encounter (Signed)
FYI from pt he is stopping ozempic

## 2021-06-12 ENCOUNTER — Other Ambulatory Visit (HOSPITAL_COMMUNITY): Payer: Self-pay

## 2021-06-12 ENCOUNTER — Other Ambulatory Visit: Payer: Self-pay

## 2021-06-17 ENCOUNTER — Other Ambulatory Visit: Payer: Self-pay

## 2021-06-17 ENCOUNTER — Other Ambulatory Visit (HOSPITAL_COMMUNITY): Payer: Self-pay

## 2021-06-18 ENCOUNTER — Other Ambulatory Visit (HOSPITAL_COMMUNITY): Payer: Self-pay

## 2021-06-18 ENCOUNTER — Ambulatory Visit: Payer: No Typology Code available for payment source | Admitting: Dietician

## 2021-06-19 ENCOUNTER — Other Ambulatory Visit: Payer: Self-pay

## 2021-06-19 ENCOUNTER — Other Ambulatory Visit (HOSPITAL_COMMUNITY): Payer: Self-pay

## 2021-06-21 ENCOUNTER — Other Ambulatory Visit: Payer: Self-pay

## 2021-06-25 ENCOUNTER — Other Ambulatory Visit: Payer: Self-pay | Admitting: Internal Medicine

## 2021-06-26 ENCOUNTER — Other Ambulatory Visit (HOSPITAL_COMMUNITY): Payer: Self-pay

## 2021-06-27 ENCOUNTER — Ambulatory Visit (INDEPENDENT_AMBULATORY_CARE_PROVIDER_SITE_OTHER): Payer: No Typology Code available for payment source | Admitting: Family Medicine

## 2021-06-27 ENCOUNTER — Other Ambulatory Visit: Payer: Self-pay

## 2021-06-27 ENCOUNTER — Encounter: Payer: Self-pay | Admitting: Family Medicine

## 2021-06-27 ENCOUNTER — Other Ambulatory Visit (HOSPITAL_COMMUNITY): Payer: Self-pay

## 2021-06-27 VITALS — BP 122/78 | HR 78 | Temp 98.0°F | Ht 69.0 in | Wt 331.8 lb

## 2021-06-27 DIAGNOSIS — R0609 Other forms of dyspnea: Secondary | ICD-10-CM

## 2021-06-27 DIAGNOSIS — E1142 Type 2 diabetes mellitus with diabetic polyneuropathy: Secondary | ICD-10-CM

## 2021-06-27 DIAGNOSIS — R002 Palpitations: Secondary | ICD-10-CM

## 2021-06-27 LAB — POCT GLYCOSYLATED HEMOGLOBIN (HGB A1C): Hemoglobin A1C: 6.6 % — AB (ref 4.0–5.6)

## 2021-06-27 MED ORDER — LOSARTAN POTASSIUM 100 MG PO TABS
100.0000 mg | ORAL_TABLET | Freq: Every day | ORAL | 1 refills | Status: DC
  Filled 2021-06-27: qty 90, 90d supply, fill #0
  Filled 2021-06-28: qty 30, 30d supply, fill #0
  Filled 2021-07-30: qty 30, 30d supply, fill #1
  Filled 2021-09-03: qty 30, 30d supply, fill #2

## 2021-06-27 NOTE — Patient Instructions (Addendum)
Keep appointment with Dr. Lovena Le as planned next week.and discussed heart palpitations, shortness of breath with your cardiologist.  If any acute changes in shortness of breath or chest pains be seen in ER. Avoid exertional exercise unless cleared by your heart specialist.  ? ?If not feeling well with ozempic, would recommend not taking that med for now. No new meds for now. Keep follow up with nutritionist as planned.  Recheck 3 months.  Let me know if there are questions sooner.  I will also refer you for diabetic retinopathy screening ? ? ? ?

## 2021-06-27 NOTE — Progress Notes (Signed)
? ?Subjective:  ?Patient ID: Juan Kline, male    DOB: [birth year]  Age: 51 y.o. MRN: [id number] ? ?CC:  ?Chief Complaint  ?Patient presents with  ? Diabetes  ?  Patient checks blood sugar at home, Fasting blood sugar is 100 ?After food 140-150, patient states he had no other concerns   ? ? ?HPI ?Juan Kline presents for  ? ?Diabetes: ?With polyneuropathy, obesity.  Last A1c stable in December.  Difficulty with weight loss.  Referred to nutrition.  Start low-dose Ozempic 0.25 mg low intensity exercise as cleared by cardiology, start with walking.  Also provided phone number for medical weight loss specialist. ?Started ozempic. 4 doses. Felt off after last dose - didn't feel good. Blood sugar 82.  ?Lowest reading. Usually random 120-160.  ?Nutritionist appt rescheduled to April.  ?Off meds past 2 weeks - felt better coming off med.  ?Short of breath with walking - more heart palpitations with walking. No chest pains.  ?Off flecainide last visit - felt like more palpitations on that med, but more palpitations with walking, and dyspnea with exertion. Low risk myocardial perfusion scan 04/26/21, EF 55%.  ?Did not donate plasma as HR was too high.  ?Cardiology appt in January discussed DOE. Feels a bout the same or a little worse.  ?Appt with Dr Lovena Le in 8 days.  ?Hx of OSA - using CPAP.  ?No caffeine.  ?Trying to eat better. Has info on DM and nutrition.  ?Microalbumin: ratio 122 on 05/08/20 - unable to provide specimen today.  ?Optho, foot exam, pneumovax:  ?Appt in past 6 months, but for glasses only.  ?On ARB, statin.  ? ?Lab Results  ?Component Value Date  ? HGBA1C 6.6 (A) 06/27/2021  ? HGBA1C 6.4 03/28/2021  ? HGBA1C 6.6 (H) 08/27/2020  ? ?Lab Results  ?Component Value Date  ? MICROALBUR 150 12/10/2016  ? [place] 89 03/28/2021  ? CREATININE 0.88 04/16/2021  ? ? ? ? ?History ?Patient Active Problem List  ? Diagnosis Date Noted  ? Palpitations 03/21/2021  ? Chest pain 01/03/2021  ? Dermatitis 05/15/2020  ? Hx of migraine  headaches 01/24/2020  ? Lipomatosis of subcutaneous tissue 12/08/2019  ? Tinnitus of both ears 11/08/2019  ? Abnormal MRI of the head 08/24/2019  ? Acute superficial gastritis without hemorrhage 08/24/2019  ? COPD mixed type ([place]) 04/04/2019  ? Positive for macroalbuminuria 03/06/2019  ? Vapes nicotine containing substance 03/03/2019  ? Class 3 severe obesity due to excess calories without serious comorbidity with body mass index (BMI) of 45.0 to 49.9 in adult [facility]) 03/03/2019  ? Hyperlipidemia associated with type 2 diabetes mellitus ([place]) 03/03/2019  ? Type 2 diabetes mellitus with diabetic polyneuropathy, without long-term current use of insulin ([place]) 04/13/2018  ? Mild depression 04/01/2018  ? OSA (obstructive sleep apnea) 10/26/2017  ? Anxiety disorder 10/26/2017  ? Peripheral neuropathy 12/12/2016  ? GAD (generalized anxiety disorder) 05/11/2015  ? Former smoker 05/11/2015  ? Other hyperlipidemia 05/22/2014  ? Vitamin D insufficiency 05/22/2014  ? Chronic intractable headache 05/19/2014  ? Numbness and tingling of foot 05/19/2014  ? Venous stasis dermatitis of both lower extremities 05/19/2014  ? history of Hypertension 03/02/2014  ? Morbid obesity ([place]) 03/02/2014  ? ?Past Medical History:  ?Diagnosis Date  ? Aortic atherosclerosis ([place])   ? Chronic foot ulcer ([place]) admitted 03/02/2014  ? medial border first metatarsal left foot  - resolved per patient 03/20/20  ? Diabetes ([place])   ? type 2 - no meds, diet  controlled  ? Diabetic foot infection ([place])   ? First degree AV block   ? GAD (generalized anxiety disorder)   ? GERD (gastroesophageal reflux disease)   ? Hx of migraines   ? Hyperlipidemia   ? Hypertension Dx 2009  ? Mild depression   ? Osteomyelitis left first metatarsal 03/02/2014  ? Sleep apnea   ? not using CPAP- machine being looked at for repair 03/21/20   ? Venous stasis ulcer of left lower extremity ([place])   ? [name] 03/02/2014 - resolved per patient 03/20/20  ? ?Past Surgical History:  ?Procedure  Laterality Date  ? BIOPSY  08/18/2019  ? Procedure: BIOPSY;  Surgeon: Thornton Park, MD;  Location: [facility];  Service: Gastroenterology;;  ? ESOPHAGOGASTRODUODENOSCOPY (EGD) WITH PROPOFOL N/A 08/18/2019  ? Procedure: ESOPHAGOGASTRODUODENOSCOPY (EGD) WITH PROPOFOL;  Surgeon: Thornton Park, MD;  Location: [facility];  Service: Gastroenterology;  Laterality: N/A;  ? ?Allergies  ?Allergen Reactions  ? Lyrica [Pregabalin] Anxiety  ? ?Prior to Admission medications   ?Medication Sig Start Date End Date Taking? Authorizing Provider  ?amLODipine (NORVASC) 10 MG tablet Take 1 tablet (10 mg total) by mouth daily. 01/03/21  Yes Ladell Pier, MD  ?aspirin 81 MG EC tablet Take 1 tablet (81 mg total) by mouth daily. Swallow whole. 01/05/21  Yes Barb Merino, MD  ?atorvastatin (LIPITOR) 40 MG tablet Take 1.5 tablets (60 mg total) by mouth daily. 03/09/21  Yes Ladell Pier, MD  ?Fenofibrate 120 MG TABS Take 1 tablet (120 mg total) by mouth daily. 02/13/21  Yes Kroeger, Daleen Snook M., PA-C  ?glucose blood test strip Check blood sugar once daily fasting or 2 hours after a meal 05/22/21  Yes Wendie Agreste, MD  ?losartan (COZAAR) 100 MG tablet Take 1 tablet (100 mg total) by mouth daily. 01/06/21  Yes Ladell Pier, MD  ?Magnesium 400 MG CAPS Take 600 mg by mouth daily.   Yes [provider]  ?omeprazole (PRILOSEC) 20 MG capsule Take 1 capsule by mouth daily as needed (indigestion). 05/16/21  Yes Wendie Agreste, MD  ?sertraline (ZOLOFT) 100 MG tablet TAKE 1.5 TABLETS (150 MG TOTAL) BY MOUTH DAILY. 03/28/21 03/28/22 Yes Wendie Agreste, MD  ?flecainide (TAMBOCOR) 50 MG tablet Take 1 tablet (50 mg total) by mouth 2 (two) times daily. ?Patient not taking: Reported on 04/29/2021 03/21/21   Evans Lance, MD  ?Semaglutide,0.25 or 0.'5MG'$ /DOS, (OZEMPIC, 0.25 OR 0.5 MG/DOSE,) 2 MG/1.5ML SOPN Inject 0.25 mg into the skin once a week. ?Patient not taking: Reported on 06/27/2021 04/29/21   Wendie Agreste,  MD  ? ?Social History  ? ?Socioeconomic History  ? Marital status: Single  ?  Spouse name: Not on file  ? Number of children: 0  ? Years of education: Not on file  ? Highest education level: Not on file  ?Occupational History  ? Not on file  ?Tobacco Use  ? Smoking status: Former  ?  Packs/day: 1.50  ?  Years: 26.00  ?  Pack years: 39.00  ?  Types: E-cigarettes, Cigarettes  ?  Start date: 80  ?  Quit date: 02/16/2014  ?  Years since quitting: 7.3  ? Smokeless tobacco: Never  ? Tobacco comments:  ?  occasional vapes - no nicotine  ?Vaping Use  ? Vaping Use: Every day  ? Start date: 04/15/2015  ? Last attempt to quit: 04/11/2019  ? Substances: Nicotine  ?Substance and Sexual Activity  ? Alcohol use: Not Currently  ?  Comment: None 03/28/21, 03/02/2014 "I rarely drink"  ? Drug use: No  ? Sexual activity: Not Currently  ?Other Topics Concern  ? Not on file  ?Social History Narrative  ? Pt lives in 1 story home with his mother and step father  ? 9th grade education  ? Last occupation was with [employer] in 2009  ?   ? Unemployed at this time- thinking of filing for disability  ?   ? Right handed  ?   ? ?Social Determinants of Health  ? ?Financial Resource Strain: Not on file  ?Food Insecurity: Not on file  ?Transportation Needs: Not on file  ?Physical Activity: Not on file  ?Stress: Not on file  ?Social Connections: Not on file  ?Intimate Partner Violence: Not on file  ? ? ?Review of Systems ?Per HPI.  ? ?Objective:  ? ?Vitals:  ? 06/27/21 1001  ?BP: 122/78  ?Pulse: 78  ?Temp: 98 ?F (36.7 ?C)  ?TempSrc: Temporal  ?SpO2: 97%  ?Weight: (!) 331 lb 12.8 oz (150.5 kg)  ?Height: '5\' 9"'$  (1.753 m)  ? ? ? ?Physical Exam ?Vitals reviewed.  ?Constitutional:   ?   Appearance: He is well-developed.  ?HENT:  ?   Head: Normocephalic and atraumatic.  ?Neck:  ?   Vascular: No carotid bruit or JVD.  ?Cardiovascular:  ?   Rate and Rhythm: Normal rate and regular rhythm.  ?   Heart sounds: Normal heart sounds. No murmur heard. ?Pulmonary:  ?    Effort: Pulmonary effort is normal.  ?   Breath sounds: Normal breath sounds. No rales.  ?Musculoskeletal:  ?   Right lower leg: No edema.  ?   Left lower leg: No edema.  ?   Comments: Calves nontender.

## 2021-06-28 ENCOUNTER — Other Ambulatory Visit: Payer: Self-pay

## 2021-06-28 ENCOUNTER — Encounter: Payer: Self-pay | Admitting: Family Medicine

## 2021-06-28 NOTE — Telephone Encounter (Signed)
Pt message in regards to thoughts on nutritionist vs dietician and idea behind the different types and treatment plan they would offer. Please advise   ?

## 2021-07-05 ENCOUNTER — Encounter: Payer: Self-pay | Admitting: Internal Medicine

## 2021-07-05 ENCOUNTER — Ambulatory Visit (INDEPENDENT_AMBULATORY_CARE_PROVIDER_SITE_OTHER): Payer: No Typology Code available for payment source | Admitting: Internal Medicine

## 2021-07-05 ENCOUNTER — Other Ambulatory Visit: Payer: Self-pay

## 2021-07-05 VITALS — BP 132/76 | HR 107 | Ht 69.0 in | Wt 335.0 lb

## 2021-07-05 DIAGNOSIS — R002 Palpitations: Secondary | ICD-10-CM

## 2021-07-05 NOTE — Progress Notes (Signed)
? ? ? ? ?HPI ?Mr. Zenz returns today for followup of his PVC's. He is a pleasant 51 yo woman with a h/o HTN and dyslipidemia who was noted to have less than 4% PVC's on cardiac monitor. I discussed the options for treatment and because he was quite symptomatic, I recommended a trial of flecainide. In the interim, he started the medication but did not think it was helping and he stopped it. He notes that his symptoms are much improved. He denies chest pain but does note dyspnea with exertion. ?Allergies  ?Allergen Reactions  ? Lyrica [Pregabalin] Anxiety  ? ? ? ?Current Outpatient Medications  ?Medication Sig Dispense Refill  ? amLODipine (NORVASC) 10 MG tablet Take 1 tablet (10 mg total) by mouth daily. 90 tablet 1  ? aspirin 81 MG EC tablet Take 1 tablet (81 mg total) by mouth daily. Swallow whole. 30 tablet 11  ? atorvastatin (LIPITOR) 40 MG tablet Take 1.5 tablets (60 mg total) by mouth daily. 45 tablet 5  ? Fenofibrate 120 MG TABS Take 1 tablet (120 mg total) by mouth daily. 90 tablet 1  ? flecainide (TAMBOCOR) 50 MG tablet Take 1 tablet (50 mg total) by mouth 2 (two) times daily. 60 tablet 11  ? glucose blood test strip Check blood sugar once daily fasting or 2 hours after a meal 100 each 12  ? losartan (COZAAR) 100 MG tablet Take 1 tablet by mouth daily. 90 tablet 1  ? Magnesium 400 MG CAPS Take 600 mg by mouth daily.    ? omeprazole (PRILOSEC) 20 MG capsule Take 1 capsule by mouth daily as needed (indigestion). 90 capsule 1  ? sertraline (ZOLOFT) 100 MG tablet TAKE 1.5 TABLETS (150 MG TOTAL) BY MOUTH DAILY. 45 tablet 11  ? ?No current facility-administered medications for this visit.  ? ? ? ?Past Medical History:  ?Diagnosis Date  ? Aortic atherosclerosis ([place])   ? Chronic foot ulcer ([place]) admitted 03/02/2014  ? medial border first metatarsal left foot  - resolved per patient 03/20/20  ? Diabetes ([place])   ? type 2 - no meds, diet controlled  ? Diabetic foot infection ([place])   ? First degree AV block   ? GAD  (generalized anxiety disorder)   ? GERD (gastroesophageal reflux disease)   ? Hx of migraines   ? Hyperlipidemia   ? Hypertension Dx 2009  ? Mild depression   ? Osteomyelitis left first metatarsal 03/02/2014  ? Sleep apnea   ? not using CPAP- machine being looked at for repair 03/21/20   ? Venous stasis ulcer of left lower extremity ([place])   ? [name] 03/02/2014 - resolved per patient 03/20/20  ? ? ?ROS: ? ? All systems reviewed and negative except as noted in the HPI. ? ? ?Past Surgical History:  ?Procedure Laterality Date  ? BIOPSY  08/18/2019  ? Procedure: BIOPSY;  Surgeon: Thornton Park, MD;  Location: [facility];  Service: Gastroenterology;;  ? ESOPHAGOGASTRODUODENOSCOPY (EGD) WITH PROPOFOL N/A 08/18/2019  ? Procedure: ESOPHAGOGASTRODUODENOSCOPY (EGD) WITH PROPOFOL;  Surgeon: Thornton Park, MD;  Location: [facility];  Service: Gastroenterology;  Laterality: N/A;  ? ? ? ?Family History  ?Problem Relation Age of Onset  ? Hypertension Mother   ? Hyperlipidemia Mother   ? Diabetes Mother   ? Kidney disease Mother   ? Hypertension Father   ? Hyperlipidemia Father   ? Diabetes Brother   ? Hypertension Brother   ? Hyperlipidemia Brother   ? Heart disease Maternal Grandmother   ?  COPD Maternal Grandmother   ? Diabetes Maternal Grandfather   ? Heart attack Paternal Grandmother   ? Heart disease Paternal Grandfather   ? Colon cancer Neg Hx   ? Colon polyps Neg Hx   ? Stomach cancer Neg Hx   ? Esophageal cancer Neg Hx   ? Ulcerative colitis Neg Hx   ? ? ? ?Social History  ? ?Socioeconomic History  ? Marital status: Single  ?  Spouse name: Not on file  ? Number of children: 0  ? Years of education: Not on file  ? Highest education level: Not on file  ?Occupational History  ? Not on file  ?Tobacco Use  ? Smoking status: Former  ?  Packs/day: 1.50  ?  Years: 26.00  ?  Pack years: 39.00  ?  Types: E-cigarettes, Cigarettes  ?  Start date: 26  ?  Quit date: 02/16/2014  ?  Years since quitting: 7.3  ? Smokeless tobacco:  Never  ? Tobacco comments:  ?  occasional vapes - no nicotine  ?Vaping Use  ? Vaping Use: Every day  ? Start date: 04/15/2015  ? Last attempt to quit: 04/11/2019  ? Substances: Nicotine  ?Substance and Sexual Activity  ? Alcohol use: Not Currently  ?  Comment: None 03/28/21, 03/02/2014 "I rarely drink"  ? Drug use: No  ? Sexual activity: Not Currently  ?Other Topics Concern  ? Not on file  ?Social History Narrative  ? Pt lives in 1 story home with his mother and step father  ? 9th grade education  ? Last occupation was with [employer] in 2009  ?   ? Unemployed at this time- thinking of filing for disability  ?   ? Right handed  ?   ? ?Social Determinants of Health  ? ?Financial Resource Strain: Not on file  ?Food Insecurity: Not on file  ?Transportation Needs: Not on file  ?Physical Activity: Not on file  ?Stress: Not on file  ?Social Connections: Not on file  ?Intimate Partner Violence: Not on file  ? ? ? ?BP 132/76   Pulse (!) 107   Ht '5\' 9"'$  (1.753 m)   Wt (!) 335 lb (152 kg)   SpO2 93%   BMI 49.47 kg/m?  ? ?Physical Exam: ? ?Morbidly obese appearing NAD ?HEENT: Unremarkable ?Neck:  No JVD, no thyromegally ?Lymphatics:  No adenopathy ?Back:  No CVA tenderness ?Lungs:  Clear with no wheezes ?HEART:  Regular rate rhythm, no murmurs, no rubs, no clicks ?Abd:  soft, positive bowel sounds, no organomegally, no rebound, no guarding ?Ext:  2 plus pulses, no edema, no cyanosis, no clubbing ?Skin:  No rashes no nodules ?Neuro:  CN II through XII intact, motor grossly intact ? ? ?Assess/Plan:  ?PVC's - his symptoms are much improved. I have recommended he hold off on flecainide unless his PVC's return.  ?Obesity -we discussed the importance of weight loss.  ?HTN - his bp is fairly well controlled on an ARB ?4. Dyslipidemia - he will continue lipitor. ? ?Carleene Overlie Elinda Bunten,MD ?

## 2021-07-05 NOTE — Patient Instructions (Addendum)
Medication Instructions:  Your physician recommends that you continue on your current medications as directed. Please refer to the Current Medication list given to you today.  Labwork: None ordered.  Testing/Procedures: None ordered.  Follow-Up: Your physician wants you to follow-up in: as needed.   Any Other Special Instructions Will Be Listed Below (If Applicable).  If you need a refill on your cardiac medications before your next appointment, please call your pharmacy.    

## 2021-07-07 ENCOUNTER — Encounter: Payer: Self-pay | Admitting: Family Medicine

## 2021-07-07 DIAGNOSIS — G4733 Obstructive sleep apnea (adult) (pediatric): Secondary | ICD-10-CM

## 2021-07-08 NOTE — Telephone Encounter (Signed)
To summarize pt has been using Cpap mask liners to increase comfort and decrease air leaking but machine reads mask fit as 0% wants to know if this is okay to use liners and this is a false reading on machine or if these should not be used ?OR ?Does pt need to go back to sleep specialist to have fitting/questions answered? ?Pt does not have current specialist  ?

## 2021-07-09 ENCOUNTER — Other Ambulatory Visit (HOSPITAL_COMMUNITY): Payer: Self-pay

## 2021-07-09 ENCOUNTER — Other Ambulatory Visit: Payer: Self-pay

## 2021-07-10 ENCOUNTER — Encounter: Payer: Self-pay | Admitting: Family Medicine

## 2021-07-10 NOTE — Telephone Encounter (Signed)
Pt bipap machine has been recalled and is asking what he should do as he was told his sxs were due to inadequate use fo the machine and he cannot use it at all anymore, should pt return to the office? ? ?

## 2021-07-11 NOTE — Telephone Encounter (Signed)
Looks like the referral went through for sleep specialist but I do not see appointment.  They would be the best to coordinate new machine if needed.  Can we check to see the timing of that appointment?  Thanks ?

## 2021-07-12 NOTE — Telephone Encounter (Signed)
Pt had referral for sleep specialty placed on 07/09/21 pt needs to see them as soon as possible due to CPAP machine recall. Have you already sent this out ? ? ?Thank you ? ?

## 2021-07-16 ENCOUNTER — Encounter: Payer: No Typology Code available for payment source | Attending: Critical Care Medicine | Admitting: Dietician

## 2021-07-16 ENCOUNTER — Encounter: Payer: Self-pay | Admitting: Internal Medicine

## 2021-07-16 ENCOUNTER — Other Ambulatory Visit (HOSPITAL_COMMUNITY): Payer: Self-pay

## 2021-07-16 ENCOUNTER — Other Ambulatory Visit: Payer: Self-pay | Admitting: Internal Medicine

## 2021-07-16 ENCOUNTER — Encounter: Payer: Self-pay | Admitting: Dietician

## 2021-07-16 DIAGNOSIS — E1142 Type 2 diabetes mellitus with diabetic polyneuropathy: Secondary | ICD-10-CM | POA: Insufficient documentation

## 2021-07-16 DIAGNOSIS — Z6841 Body Mass Index (BMI) 40.0 and over, adult: Secondary | ICD-10-CM | POA: Insufficient documentation

## 2021-07-16 DIAGNOSIS — Z713 Dietary counseling and surveillance: Secondary | ICD-10-CM | POA: Insufficient documentation

## 2021-07-16 NOTE — Progress Notes (Signed)
Medical Nutrition Therapy  ?Appointment Start time:  7782  Appointment End time:  4235 ? ?Primary concerns today: Weight Loss  ?Referral diagnosis: E11.42 - Type 2 Diabetes Mellitus with diabetic polyneuropathy, without long-term current use of insulin, E66.01, Z68.42 - Class 3 severe obesity with serious comorbidity and body mass index (BMI) of 45.0 to 49.9 in adult, unspecified ?Preferred learning style: Visual, hands on ?Learning readiness: Ready ? ? ?NUTRITION ASSESSMENT  ? ?Anthropometrics  ?Ht: 5'9" ?Wt: 334 lbs ?Body mass index is 49.32 kg/m?. ? ? ?Clinical ?Medical Hx: T2DM, Heart palpitations, HTN, HLD, OSA, COPD, Neuropathy ?Medications: Amlodipine,  Losartan, Lipitor, Fenofibrate, Magnesium ?Labs: A1c - 6.6%, BUN - 29 (Elevated), TGL - 179.0 ?Notable Signs/Symptoms: Central adiposity ? ? ?Lifestyle & Dietary Hx ?Pt reports wanting to lose weight, pt states they were comfortable at a body weight of 230 lbs. Pt wants to reach this goal weight by losing 100 pounds.  ?Pt has history of T2DM, A1c has been controlled in the mid 6% range for years. HTN and HLD are controlled, except for slightly elevated triglycerides. Pt reports getting discouraged at times over their health not improving, primarily their weight gain. ?Pt was previously taking Ozempic for about 4-5 weeks, but discontinued it a few weeks ago. Pt reports not feeling any difference in appetite or fullness while on it. ?Pt reports that they experience heart palpitations when they try to exercise, states it keeps them from being active. ?Pt states that they tend to drink sugar-free Twist, or Sprite Zero, very little water. Pt avoids caffeinated beverages. ?Pt does not cook, tends to eat cereal and sandwiches. Pt intake of fruits and veggies is very limited. ? ?Estimated daily fluid intake: 80 oz ?Supplements: Fish Oil ?Sleep: OSA on CPAP ?Stress / self-care: Stress over health. ?Current average weekly physical activity: ADLs, difficulty being  active ? ?24-Hr Dietary Recall ?First Meal: Bowl of honey bunches of oats w/ almonds, skim milk ?Snack: None ?Second Meal: Chicken breast, swiss cheese, lettuce, whole grain bread ?Snack: Low sodium Wheat Thins ?Third Meal: Corn flakes, skim milk ?Snack: None ?Beverages: water, Sprite Zero ? ? ?NUTRITION DIAGNOSIS  ?NB-1.1 Food and nutrition-related knowledge deficit As related to obesity.  As evidenced by BMI of 49.32 kg/m2, very limited fruit and vegetable intake, and inability to be active. ? ? ?NUTRITION INTERVENTION  ?Nutrition education (E-1) on the following topics:  ?Educated patient on the balanced plate eating model. Recommended lunch and dinner be 1/2 non-starchy vegetables, 1/4 starches, and 1/4 protein. Recommended breakfast be a balance of starch and protein with a piece of fruit. Discussed with patient the importance of working towards hitting the proportions of the balanced plate consistently. Counseled patient on ways to begin recognizing each of the food groups from the balanced plate in their own meals, and how close they are to fitting the recommended proportions of the balanced plate. Educated patient on the nutritional value of each food group on the balanced plate model.  ? ? ?Handouts Provided Include  ?Balanced Plate ?Balanced Plate food list ? ?Learning Style & Readiness for Change ?Teaching method utilized: Visual & Auditory  ?Demonstrated degree of understanding via: Teach Back  ?Barriers to learning/adherence to lifestyle change: None ? ?Goals Established by Pt ?Try sugar-free powders like Crystal Light or Rehydration packets in water to increase your daily water intake. ?Try "Steam in Bag" veggies. Moderate versions that say "Seasoned, or In-Sauce". ?Continue eating three meals a day, about 5-6 hours apart! ?Begin to recognize carbohydrates, proteins,  and non-starchy vegetables in your food choices! ?Begin to build your meals using the proportions of the Balanced Plate. ?First, select  your carb choice(s) for the meal. Make this 25% of your meal. ?Next, select your source of protein to pair with your carb choice(s). Make this another 25% of your meal. ?Finally, complete your meal with a variety of non-starchy vegetables. Make this the remaining 50% of your meal. ? ? ?MONITORING & EVALUATION ?Dietary intake, weekly physical activity, and weight loss in 2 months. ? ?Next Steps  ?Patient is to follow up with RD. ? ?

## 2021-07-16 NOTE — Patient Instructions (Addendum)
Try sugar-free powders like Crystal Light or Rehydration packets in water to increase your daily water intake. ? ?Try "Steam in Bag" veggies. Moderate versions that say "Seasoned, or In-Sauce". ? ?Continue eating three meals a day, about 5-6 hours apart! ? ?Begin to recognize carbohydrates, proteins, and non-starchy vegetables in your food choices! ? ?Begin to build your meals using the proportions of the Balanced Plate. ?First, select your carb choice(s) for the meal. Make this 25% of your meal. ?Next, select your source of protein to pair with your carb choice(s). Make this another 25% of your meal. ?Finally, complete your meal with a variety of non-starchy vegetables. Make this the remaining 50% of your meal. ? ?

## 2021-07-17 ENCOUNTER — Other Ambulatory Visit (HOSPITAL_COMMUNITY): Payer: Self-pay

## 2021-07-17 ENCOUNTER — Encounter (INDEPENDENT_AMBULATORY_CARE_PROVIDER_SITE_OTHER): Payer: No Typology Code available for payment source | Admitting: Ophthalmology

## 2021-07-17 MED ORDER — AMLODIPINE BESYLATE 10 MG PO TABS
10.0000 mg | ORAL_TABLET | Freq: Every day | ORAL | 0 refills | Status: DC
  Filled 2021-07-17: qty 30, 30d supply, fill #0

## 2021-07-18 ENCOUNTER — Other Ambulatory Visit (HOSPITAL_COMMUNITY): Payer: Self-pay

## 2021-07-19 ENCOUNTER — Other Ambulatory Visit: Payer: Self-pay

## 2021-07-19 ENCOUNTER — Other Ambulatory Visit (HOSPITAL_COMMUNITY): Payer: Self-pay

## 2021-07-25 ENCOUNTER — Ambulatory Visit (INDEPENDENT_AMBULATORY_CARE_PROVIDER_SITE_OTHER): Payer: No Typology Code available for payment source | Admitting: Pulmonary Disease

## 2021-07-25 ENCOUNTER — Encounter: Payer: Self-pay | Admitting: Pulmonary Disease

## 2021-07-25 ENCOUNTER — Ambulatory Visit (INDEPENDENT_AMBULATORY_CARE_PROVIDER_SITE_OTHER): Payer: No Typology Code available for payment source

## 2021-07-25 VITALS — BP 120/80 | HR 80 | Temp 97.7°F | Ht 69.0 in | Wt 331.0 lb

## 2021-07-25 VITALS — BP 118/70 | HR 76 | Ht 69.0 in | Wt 332.2 lb

## 2021-07-25 DIAGNOSIS — I493 Ventricular premature depolarization: Secondary | ICD-10-CM

## 2021-07-25 DIAGNOSIS — G4733 Obstructive sleep apnea (adult) (pediatric): Secondary | ICD-10-CM

## 2021-07-25 DIAGNOSIS — R002 Palpitations: Secondary | ICD-10-CM

## 2021-07-25 NOTE — Patient Instructions (Signed)
Medication Instructions:  ?Your physician recommends that you continue on your current medications as directed. Please refer to the Current Medication list given to you today. ? ?*If you need a refill on your cardiac medications before your next appointment, please call your pharmacy* ? ? ?Lab Work: ?None ordered. ? ?If you have labs (blood work) drawn today and your tests are completely normal, you will receive your results only by: ?MyChart Message (if you have MyChart) OR ?A paper copy in the mail ?If you have any lab test that is abnormal or we need to change your treatment, we will call you to review the results. ? ? ?Testing/Procedures: ?None ordered. ? ? ? ?Follow-Up: ?At [facility], you and your health needs are our priority.  As part of our continuing mission to provide you with exceptional heart care, we have created designated Provider Care Teams.  These Care Teams include your primary Cardiologist (physician) and Advanced Practice Providers (APPs -  Physician Assistants and Nurse Practitioners) who all work together to provide you with the care you need, when you need it. ? ?We recommend signing up for the patient portal called "MyChart".  Sign up information is provided on this After Visit Summary.  MyChart is used to connect with patients for Virtual Visits (Telemedicine).  Patients are able to view lab/test results, encounter notes, upcoming appointments, etc.  Non-urgent messages can be sent to your provider as well.   ?To learn more about what you can do with MyChart, go to [email or web address].   ? ?Your next appointment:   ?Follow up as advised by Dr Lovena Le. ? ?Important Information About Sugar ? ? ? ? ?  ?

## 2021-07-25 NOTE — Patient Instructions (Signed)
We will contact her [employer] to try and get some information regarding new machine ? ?Continue BiPAP use nightly ?-Settings as we know is 19/15 ?-The medical supply company should be able to confirm what current settings you on ?-Documentation in your records did indicate that pressure changes were going to be made in November 2022 ? ?Continue using machine nightly ? ?Continue weight loss efforts ? ?Replacement machines do take time to come through ? ?Tentative follow-up in 6 months ? ? ? ?

## 2021-07-25 NOTE — Progress Notes (Signed)
? ?  Nurse Visit  ? ?Date of Encounter: 07/25/2021 ?ID: Juan Kline, DOB 09/16/1970, MRN [id number] ? ?PCP:  Wendie Agreste, MD ?  ?[facility] Providers ?Cardiologist:  Pixie Casino, MD { ? ? ?Visit Details  ? ?VS:  BP 118/70   Pulse 76   Ht '5\' 9"'$  (1.753 m)   Wt (!) 332 lb 3.2 oz (150.7 kg)   BMI 49.06 kg/m?  , BMI Body mass index is 49.06 kg/m?. ? ?Wt Readings from Last 3 Encounters:  ?07/25/21 (!) 332 lb 3.2 oz (150.7 kg)  ?07/25/21 (!) 331 lb (150.1 kg)  ?07/16/21 (!) 334 lb (151.5 kg)  ?  ? ?Reason for visit: Flecainide start ?Performed today:   , Vitals, EKG, Provider consulted:Dr Ali Lowe, DOD, and Education ?Changes (medications, testing, etc.) : none ?4.    Length of Visit: 20 minutes ? ? ? ?Medications Adjustments/Labs and Tests Ordered: ?Orders Placed This Encounter  ?Procedures  ? EKG 12-Lead  ? ?No orders of the defined types were placed in this encounter. ? ?EKG completed and reviewed with Dr Ali Lowe, DOD.  Pt advised to continue Flecainide as prescribed and follow up with Dr Lovena Le as directed. ? ?Signed, ?Thora Lance, RN  ?07/25/2021 11:36 AM  ?

## 2021-07-25 NOTE — Progress Notes (Signed)
? ?      ?Juan Kline    [id number]    [birth year] ? ?Primary Care Physician:Greene, Ranell Patrick, MD ? ?Referring Physician: Wendie Agreste, MD ?[name] ?[place],  [place] [address] ? ?Chief complaint:   ?In for follow-up for severe obstructive sleep apnea ? ?HPI: ? ?Diagnosed in 2019 with an AHI in the 80s ?Was last seen in 2021 ?Was having some difficulty tolerating BiPAP at the time ? ?Came in today for follow-up ?Has been using his machine regularly ?His machine is part of the recall and he has filled out his application for a new machine ? ?Sleeping well, feels machine is working okay ? ?Pressure changes recently ? ?Wakes up several times during the night ? ?No significant changes in his health recently ? ?Does have dryness of his mouth in the mornings ?No headaches ?Memory is poor ? ?He is tolerating current pressure settings which he is not very sure of pressure settings but last pressure settings was 19/15 ? ?Outpatient Encounter Medications as of 07/25/2021  ?Medication Sig  ? amLODipine (NORVASC) 10 MG tablet Take 1 tablet (10 mg total) by mouth daily.  ? aspirin 81 MG EC tablet Take 1 tablet (81 mg total) by mouth daily. Swallow whole.  ? atorvastatin (LIPITOR) 40 MG tablet Take 1.5 tablets (60 mg total) by mouth daily.  ? Fenofibrate 120 MG TABS Take 1 tablet (120 mg total) by mouth daily.  ? flecainide (TAMBOCOR) 50 MG tablet Take 1 tablet (50 mg total) by mouth 2 (two) times daily.  ? glucose blood test strip Check blood sugar once daily fasting or 2 hours after a meal  ? losartan (COZAAR) 100 MG tablet Take 1 tablet by mouth daily.  ? Magnesium 400 MG CAPS Take 600 mg by mouth daily.  ? omeprazole (PRILOSEC) 20 MG capsule Take 1 capsule by mouth daily as needed (indigestion).  ? sertraline (ZOLOFT) 100 MG tablet TAKE 1.5 TABLETS (150 MG TOTAL) BY MOUTH DAILY.  ? ?No facility-administered encounter medications on file as of 07/25/2021.  ? ? ?Allergies as of 07/25/2021 - Review Complete  07/25/2021  ?Allergen Reaction Noted  ? Lyrica [pregabalin] Anxiety 08/12/2016  ? ? ?Past Medical History:  ?Diagnosis Date  ? Aortic atherosclerosis ([place])   ? Chronic foot ulcer ([place]) admitted 03/02/2014  ? medial border first metatarsal left foot  - resolved per patient 03/20/20  ? Diabetes ([place])   ? type 2 - no meds, diet controlled  ? Diabetic foot infection ([place])   ? First degree AV block   ? GAD (generalized anxiety disorder)   ? GERD (gastroesophageal reflux disease)   ? Hx of migraines   ? Hyperlipidemia   ? Hypertension Dx 2009  ? Mild depression   ? Osteomyelitis left first metatarsal 03/02/2014  ? Sleep apnea   ? not using CPAP- machine being looked at for repair 03/21/20   ? Venous stasis ulcer of left lower extremity ([place])   ? [name] 03/02/2014 - resolved per patient 03/20/20  ? ? ?Past Surgical History:  ?Procedure Laterality Date  ? BIOPSY  08/18/2019  ? Procedure: BIOPSY;  Surgeon: Thornton Park, MD;  Location: [facility];  Service: Gastroenterology;;  ? ESOPHAGOGASTRODUODENOSCOPY (EGD) WITH PROPOFOL N/A 08/18/2019  ? Procedure: ESOPHAGOGASTRODUODENOSCOPY (EGD) WITH PROPOFOL;  Surgeon: Thornton Park, MD;  Location: [facility];  Service: Gastroenterology;  Laterality: N/A;  ? ? ?Family History  ?Problem Relation Age of Onset  ? Hypertension Mother   ?  Hyperlipidemia Mother   ? Diabetes Mother   ? Kidney disease Mother   ? Hypertension Father   ? Hyperlipidemia Father   ? Diabetes Brother   ? Hypertension Brother   ? Hyperlipidemia Brother   ? Heart disease Maternal Grandmother   ? COPD Maternal Grandmother   ? Diabetes Maternal Grandfather   ? Heart attack Paternal Grandmother   ? Heart disease Paternal Grandfather   ? Colon cancer Neg Hx   ? Colon polyps Neg Hx   ? Stomach cancer Neg Hx   ? Esophageal cancer Neg Hx   ? Ulcerative colitis Neg Hx   ? ? ?Social History  ? ?Socioeconomic History  ? Marital status: Single  ?  Spouse name: Not on file  ? Number of children: 0  ? Years of  education: Not on file  ? Highest education level: Not on file  ?Occupational History  ? Not on file  ?Tobacco Use  ? Smoking status: Former  ?  Packs/day: 1.50  ?  Years: 26.00  ?  Pack years: 39.00  ?  Types: E-cigarettes, Cigarettes  ?  Start date: 37  ?  Quit date: 02/16/2014  ?  Years since quitting: 7.4  ? Smokeless tobacco: Never  ? Tobacco comments:  ?  occasional vapes - no nicotine  ?Vaping Use  ? Vaping Use: Every day  ? Start date: 04/15/2015  ? Last attempt to quit: 04/11/2019  ? Substances: Nicotine  ?Substance and Sexual Activity  ? Alcohol use: Not Currently  ?  Comment: None 03/28/21, 03/02/2014 "I rarely drink"  ? Drug use: No  ? Sexual activity: Not Currently  ?Other Topics Concern  ? Not on file  ?Social History Narrative  ? Pt lives in 1 story home with his mother and step father  ? 9th grade education  ? Last occupation was with [employer] in 2009  ?   ? Unemployed at this time- thinking of filing for disability  ?   ? Right handed  ?   ? ?Social Determinants of Health  ? ?Financial Resource Strain: Not on file  ?Food Insecurity: Not on file  ?Transportation Needs: Not on file  ?Physical Activity: Not on file  ?Stress: Not on file  ?Social Connections: Not on file  ?Intimate Partner Violence: Not on file  ? ? ?Review of Systems  ?Respiratory:  Positive for apnea and shortness of breath.   ?Psychiatric/Behavioral:  Positive for sleep disturbance.   ?All other systems reviewed and are negative. ? ?Vitals:  ? 07/25/21 0902  ?BP: 120/80  ?Pulse: 80  ?Temp: 97.7 ?F (36.5 ?C)  ?SpO2: 97%  ? ? ? ?Physical Exam ?Constitutional:   ?   Appearance: He is obese.  ?HENT:  ?   Mouth/Throat:  ?   Mouth: Mucous membranes are moist.  ?   Comments: Mallampati 3, crowded oropharynx ?Eyes:  ?   General:     ?   Right eye: No discharge.     ?   Left eye: No discharge.  ?Cardiovascular:  ?   Rate and Rhythm: Normal rate and regular rhythm.  ?   Heart sounds: No murmur heard. ?  No friction rub.  ?Pulmonary:  ?    Effort: No respiratory distress.  ?   Breath sounds: No stridor. No wheezing or rhonchi.  ?Musculoskeletal:  ?   Cervical back: No rigidity or tenderness.  ?Neurological:  ?   Mental Status: He is alert.  ?Psychiatric:     ?  Mood and Affect: Mood normal.  ?  ? ?  03/21/2020  ?  9:34 AM  ?Results of the Epworth flowsheet  ?Sitting and reading 1  ?Watching TV 1  ?Sitting, inactive in a public place (e.g. a theatre or a meeting) 1  ?As a passenger in a car for an hour without a break 0  ?Lying down to rest in the afternoon when circumstances permit 3  ?Sitting and talking to someone 0  ?Sitting quietly after a lunch without alcohol 0  ?In a car, while stopped for a few minutes in traffic 0  ?Total score 6  ? ? ?Data Reviewed: ?Sleep study reviewed showing severe obstructive sleep apnea ? ?Titration study reviewed by myself showing he was titrated to 19/15 ? ?Assessment:  ?Obstructive sleep apnea ?-Tolerating BiPAP well ?-No significant difficulty with machine ?No changes to his health ? ?Morbid obesity ?-Encouraged to continue working on weight loss efforts ? ? ?Plan/Recommendations: ? ?We will contact medical supply company [employer] to see if we can get some information about his machine ? ?Continue using current machine ? ?Awaiting replacement device as his machine was part of the recall ? ?Tentative follow-up in 6 months ? ?Sherrilyn Rist MD ?[facility] ?07/25/2021, 9:48 AM ? ?CC: Wendie Agreste, MD ? ? ?

## 2021-07-30 ENCOUNTER — Other Ambulatory Visit (HOSPITAL_COMMUNITY): Payer: Self-pay

## 2021-07-31 ENCOUNTER — Encounter: Payer: Self-pay | Admitting: Pulmonary Disease

## 2021-07-31 DIAGNOSIS — G4733 Obstructive sleep apnea (adult) (pediatric): Secondary | ICD-10-CM

## 2021-08-01 NOTE — Telephone Encounter (Signed)
Mychart message sent by pt: ?Seymour Bars "Scott"  [facility] (supporting Olalere, Adewale A, MD) 18 hours ago (6:17 PM)  ? ?Hello, I guess this message is probably for [place],but I got a call from the [employer] or whatever about the new bipap machine and basically what I can get out of it is that I would have to pay for it from them and I'm on the [place] discount/ [place] thing and the reason I'm on it is because I don't have the money for health insurance or anything else for that matter, and I'm applying for disability as we speak and that will be awhile IF I even get it. Anyways, they'd said something about getting back with me in June or so, but I'm not going to be able to afford it then either and I informed them of that, so do you have anymore options,or do I need to just wait and pray that I will eventually get another one from the [place]? Looking forward to hearing back from you soon and thank you very much!  ? ? ? ? ?Dr. Jenetta Downer, please advise on this. ?

## 2021-08-06 ENCOUNTER — Other Ambulatory Visit: Payer: Self-pay

## 2021-08-06 ENCOUNTER — Other Ambulatory Visit (HOSPITAL_COMMUNITY): Payer: Self-pay

## 2021-08-09 ENCOUNTER — Encounter: Payer: Self-pay | Admitting: Family Medicine

## 2021-08-12 NOTE — Telephone Encounter (Signed)
Please review most recent messages pt is unsatisfied  ?

## 2021-08-12 NOTE — Telephone Encounter (Signed)
From chart observation pt should talk to pulmonology as they are who should be ordering sleep related items and should be able to give new order? Anything else you can advise? I was going to also recommend pt reach out to insurance about coverage and inquire with them.  ? ? ?

## 2021-08-12 NOTE — Telephone Encounter (Signed)
I would recommend he discuss with Dr. Gearldine Bienenstock office, pulmonology, as they did place a referral for the DME and they may have some more insight into how they can help with his situation further.  Thanks ?

## 2021-08-13 ENCOUNTER — Other Ambulatory Visit (HOSPITAL_COMMUNITY): Payer: Self-pay

## 2021-08-13 ENCOUNTER — Other Ambulatory Visit: Payer: Self-pay

## 2021-08-13 ENCOUNTER — Other Ambulatory Visit: Payer: Self-pay | Admitting: Family Medicine

## 2021-08-13 ENCOUNTER — Other Ambulatory Visit: Payer: Self-pay | Admitting: Medical

## 2021-08-13 MED ORDER — AMLODIPINE BESYLATE 10 MG PO TABS
10.0000 mg | ORAL_TABLET | Freq: Every day | ORAL | 0 refills | Status: DC
  Filled 2021-08-13: qty 30, 30d supply, fill #0

## 2021-08-13 MED ORDER — FENOFIBRATE 120 MG PO TABS
120.0000 mg | ORAL_TABLET | Freq: Every day | ORAL | 3 refills | Status: DC
  Filled 2021-08-13 – 2021-08-19 (×2): qty 90, 90d supply, fill #0
  Filled 2021-08-20: qty 30, 30d supply, fill #0
  Filled 2021-09-16 – 2021-09-17 (×2): qty 30, 30d supply, fill #1
  Filled 2021-10-12 – 2021-10-14 (×2): qty 30, 30d supply, fill #2
  Filled 2021-11-18: qty 90, 90d supply, fill #3
  Filled 2021-11-18 – 2021-11-20 (×2): qty 30, 30d supply, fill #3

## 2021-08-14 NOTE — Telephone Encounter (Signed)
Pt is willing to do new sleep study if that is recommended  ?

## 2021-08-20 ENCOUNTER — Other Ambulatory Visit: Payer: Self-pay

## 2021-08-20 ENCOUNTER — Other Ambulatory Visit (HOSPITAL_COMMUNITY): Payer: Self-pay

## 2021-08-21 ENCOUNTER — Other Ambulatory Visit: Payer: Self-pay

## 2021-09-03 ENCOUNTER — Other Ambulatory Visit (HOSPITAL_COMMUNITY): Payer: Self-pay

## 2021-09-04 ENCOUNTER — Other Ambulatory Visit: Payer: Self-pay

## 2021-09-04 ENCOUNTER — Ambulatory Visit (INDEPENDENT_AMBULATORY_CARE_PROVIDER_SITE_OTHER): Payer: No Typology Code available for payment source | Admitting: Family Medicine

## 2021-09-04 ENCOUNTER — Encounter: Payer: Self-pay | Admitting: Family Medicine

## 2021-09-04 VITALS — BP 128/76 | HR 57 | Temp 98.1°F | Resp 17 | Ht 69.0 in | Wt 331.0 lb

## 2021-09-04 DIAGNOSIS — K219 Gastro-esophageal reflux disease without esophagitis: Secondary | ICD-10-CM

## 2021-09-04 DIAGNOSIS — R002 Palpitations: Secondary | ICD-10-CM

## 2021-09-04 DIAGNOSIS — G4733 Obstructive sleep apnea (adult) (pediatric): Secondary | ICD-10-CM

## 2021-09-04 DIAGNOSIS — R5381 Other malaise: Secondary | ICD-10-CM

## 2021-09-04 DIAGNOSIS — I10 Essential (primary) hypertension: Secondary | ICD-10-CM

## 2021-09-04 DIAGNOSIS — E1142 Type 2 diabetes mellitus with diabetic polyneuropathy: Secondary | ICD-10-CM

## 2021-09-04 DIAGNOSIS — R0609 Other forms of dyspnea: Secondary | ICD-10-CM

## 2021-09-04 LAB — COMPREHENSIVE METABOLIC PANEL
ALT: 23 U/L (ref 0–53)
AST: 22 U/L (ref 0–37)
Albumin: 4.4 g/dL (ref 3.5–5.2)
Alkaline Phosphatase: 38 U/L — ABNORMAL LOW (ref 39–117)
BUN: 28 mg/dL — ABNORMAL HIGH (ref 6–23)
CO2: 22 mEq/L (ref 19–32)
Calcium: 9.6 mg/dL (ref 8.4–10.5)
Chloride: 102 mEq/L (ref 96–112)
Creatinine, Ser: 0.94 mg/dL (ref 0.40–1.50)
GFR: 94.9 mL/min (ref >60.00)
Glucose, Bld: 167 mg/dL — ABNORMAL HIGH (ref 70–99)
Potassium: 4.4 mEq/L (ref 3.5–5.1)
Sodium: 137 mEq/L (ref 135–145)
Total Bilirubin: 0.4 mg/dL (ref 0.2–1.2)
Total Protein: 7.2 g/dL (ref 6.0–8.3)

## 2021-09-04 LAB — HEMOGLOBIN A1C: Hgb A1c MFr Bld: 6.3 % (ref 4.6–6.5)

## 2021-09-04 LAB — MAGNESIUM: Magnesium: 1.8 mg/dL (ref 1.5–2.5)

## 2021-09-04 MED ORDER — LOSARTAN POTASSIUM 100 MG PO TABS
100.0000 mg | ORAL_TABLET | Freq: Every day | ORAL | 1 refills | Status: DC
  Filled 2021-09-04 – 2021-09-28 (×2): qty 90, 90d supply, fill #0
  Filled 2021-12-29 – 2021-12-30 (×2): qty 90, 90d supply, fill #1

## 2021-09-04 MED ORDER — AMLODIPINE BESYLATE 10 MG PO TABS
10.0000 mg | ORAL_TABLET | Freq: Every day | ORAL | 1 refills | Status: DC
  Filled 2021-09-04 – 2021-09-16 (×2): qty 90, 90d supply, fill #0
  Filled 2021-09-17: qty 30, 30d supply, fill #0
  Filled 2021-10-12 – 2021-10-14 (×2): qty 30, 30d supply, fill #1
  Filled 2021-11-11 (×2): qty 30, 30d supply, fill #2
  Filled 2021-12-13 (×2): qty 30, 30d supply, fill #3
  Filled 2022-01-10 (×2): qty 30, 30d supply, fill #4
  Filled 2022-02-10: qty 30, 30d supply, fill #5

## 2021-09-04 MED ORDER — OMEPRAZOLE 20 MG PO CPDR
20.0000 mg | DELAYED_RELEASE_CAPSULE | Freq: Every day | ORAL | 1 refills | Status: DC | PRN
  Filled 2021-09-04: qty 90, 90d supply, fill #0
  Filled 2022-02-10: qty 90, 90d supply, fill #1

## 2021-09-04 NOTE — Patient Instructions (Addendum)
Call your cardiologist about the shortness of breath with activity. Stress test in January was reassuring, but can discuss if other testing needed.  I also recommend discussing the symptoms with your lung specialist.  I ordered a chest x-ray, please have that performed at the [facility] location.  [facility] in 8:30-4:30 during weekdays, no appointment needed [place].  [place], [place] [address]   I would recommend meeting with an ENT about the ringing in the ears.  Call your insurance to see who is in network for ENT, and let me know so I can place a referral.  I will check blood tests today.    Return to the clinic or go to the nearest emergency room if any of your symptoms worsen or new symptoms occur.

## 2021-09-04 NOTE — Progress Notes (Signed)
Subjective:  Patient ID: Juan Kline, male    DOB: February 20, 1971  Age: 51 y.o. MRN: [id number]  CC:  Chief Complaint  Patient presents with   Hypertension    Pt needs refill on medication, notes some general "off feeling" unsure if this was related to the blood pressure or his sleep issues   Labs Only    Pt would like some labs checked due to feeling off as he does note history of abnormal potassium magnesium and other     HPI Juan Kline presents for   Hypertension: With underlying sleep apnea on BiPAP, has been evaluated by pulmonary, see messages regarding some difficulty with BiPAP machine. Losartan 100 mg daily, amlodipine 10 mg daily.  History of PVCs, has seen cardiology, electrophysiology, appoint with Dr. Lovena Le March 24 noted.  flecainide 50 mg daily prescribed previously, but not taking as symptoms improved.  Recommended to abstain from flecainide and less PVCs returned per note from Dr. Lovena Le.  No other new medications. PVC's returned, went back on flecainide early April. Helped. Less PVC's.  No chest pains.  Seems like gets out of breath. Last few months. Sometimes with walking to bathroom, but other times no difficulty with activity.  Cardiology - Dr. Debara Pickett. Low risk stress test in January.  CXR in 12/2020: IMPRESSION: Minimal bibasilar atelectasis. Otherwise no acute cardiopulmonary findings Using Bipap nightly. Coordinating settings with pulmonary.   Home readings: 120/70 range.  BP Readings from Last 3 Encounters:  09/04/21 128/76  07/25/21 118/70  07/25/21 120/80   Lab Results  Component Value Date   CREATININE 0.88 04/16/2021   Malaise Several years. Same symptoms. Ringing in the ears, does note feel well when tinnitus worse. Worse ringing when upright. No new HA. Referred to ENT last year - not in network? Cost prohibitive. No difficulty with hearing.  Pvc's better,no CP. No focal weakness.  Seen by neuro in past for these sx's - possible silent migraine, no  change with topamax. Stopped.   Last lab work in January with normal sodium, potassium, creatinine. Magnesium level was normal at 1.8 in November 2022.  Low reading/borderline of 126 in September 2022.  Omeprazole as needed for GERD - stable with intermittent dosing.   History Patient Active Problem List   Diagnosis Date Noted   Palpitations 03/21/2021   Chest pain 01/03/2021   Dermatitis 05/15/2020   Hx of migraine headaches 01/24/2020   Lipomatosis of subcutaneous tissue 12/08/2019   Tinnitus of both ears 11/08/2019   Abnormal MRI of the head 08/24/2019   Acute superficial gastritis without hemorrhage 08/24/2019   COPD mixed type ([place]) 04/04/2019   Positive for macroalbuminuria 03/06/2019   Vapes nicotine containing substance 03/03/2019   Class 3 severe obesity due to excess calories without serious comorbidity with body mass index (BMI) of 45.0 to 49.9 in adult [facility]) 03/03/2019   Hyperlipidemia associated with type 2 diabetes mellitus ([place]) 03/03/2019   Type 2 diabetes mellitus with diabetic polyneuropathy, without long-term current use of insulin ([place]) 04/13/2018   Mild depression 04/01/2018   OSA (obstructive sleep apnea) 10/26/2017   Anxiety disorder 10/26/2017   Peripheral neuropathy 12/12/2016   GAD (generalized anxiety disorder) 05/11/2015   Former smoker 05/11/2015   Other hyperlipidemia 05/22/2014   Vitamin D insufficiency 05/22/2014   Chronic intractable headache 05/19/2014   Numbness and tingling of foot 05/19/2014   Venous stasis dermatitis of both lower extremities 05/19/2014   history of Hypertension 03/02/2014   Morbid obesity ([place]) 03/02/2014  Past Medical History:  Diagnosis Date   Aortic atherosclerosis ([place])    Chronic foot ulcer ([place]) admitted 03/02/2014   medial border first metatarsal left foot  - resolved per patient 03/20/20   Diabetes ([place])    type 2 - no meds, diet controlled   Diabetic foot infection ([place])    First degree AV block    GAD  (generalized anxiety disorder)    GERD (gastroesophageal reflux disease)    Hx of migraines    Hyperlipidemia    Hypertension Dx 2009   Mild depression    Osteomyelitis left first metatarsal 03/02/2014   Sleep apnea    not using CPAP- machine being looked at for repair 03/21/20    Venous stasis ulcer of left lower extremity ([place])    Juan Kline 03/02/2014 - resolved per patient 03/20/20   Past Surgical History:  Procedure Laterality Date   BIOPSY  08/18/2019   Procedure: BIOPSY;  Surgeon: Thornton Park, MD;  Location: [facility];  Service: Gastroenterology;;   ESOPHAGOGASTRODUODENOSCOPY (EGD) WITH PROPOFOL N/A 08/18/2019   Procedure: ESOPHAGOGASTRODUODENOSCOPY (EGD) WITH PROPOFOL;  Surgeon: Thornton Park, MD;  [facility];  Service: Gastroenterology;  Laterality: N/A;   Allergies  Allergen Reactions   Lyrica [Pregabalin] Anxiety   Prior to Admission medications   Medication Sig Start Date End Date Taking? Authorizing Provider  amLODipine (NORVASC) 10 MG tablet Take 1 tablet by mouth daily. 08/13/21  Yes Wendie Agreste, MD  aspirin 81 MG EC tablet Take 1 tablet (81 mg total) by mouth daily. Swallow whole. 01/05/21  Yes Barb Merino, MD  atorvastatin (LIPITOR) 40 MG tablet Take 1.5 tablets (60 mg total) by mouth daily. 03/09/21  Yes Ladell Pier, MD  Fenofibrate 120 MG TABS Take 1 tablet by mouth once daily. 08/13/21  Yes Hilty, Nadean Corwin, MD  flecainide (TAMBOCOR) 50 MG tablet Take 1 tablet (50 mg total) by mouth 2 (two) times daily. 03/21/21  Yes Evans Lance, MD  glucose blood test strip Check blood sugar once daily fasting or 2 hours after a meal 05/22/21  Yes Wendie Agreste, MD  losartan (COZAAR) 100 MG tablet Take 1 tablet by mouth daily. 06/27/21  Yes Wendie Agreste, MD  Magnesium 400 MG CAPS Take 600 mg by mouth daily.   Yes [provider]  omeprazole (PRILOSEC) 20 MG capsule Take 1 capsule by mouth daily as needed (indigestion). 05/16/21  Yes  Wendie Agreste, MD  sertraline (ZOLOFT) 100 MG tablet TAKE 1.5 TABLETS (150 MG TOTAL) BY MOUTH DAILY. 03/28/21 03/28/22 Yes Wendie Agreste, MD   Social History   Socioeconomic History   Marital status: Single    Spouse name: Not on file   Number of children: 0   Years of education: Not on file   Highest education level: Not on file  Occupational History   Not on file  Tobacco Use   Smoking status: Former    Packs/day: 1.50    Years: 26.00    Pack years: 39.00    Types: E-cigarettes, Cigarettes    Start date: 1989    Quit date: 02/16/2014    Years since quitting: 7.5   Smokeless tobacco: Never   Tobacco comments:    occasional vapes - no nicotine  Vaping Use   Vaping Use: Every day   Start date: 04/15/2015   Last attempt to quit: 04/11/2019   Substances: Nicotine  Substance and Sexual Activity   Alcohol use: Not Currently    Comment:  None 03/28/21, 03/02/2014 "I rarely drink"   Drug use: No   Sexual activity: Not Currently  Other Topics Concern   Not on file  Social History Narrative   Pt lives in 1 story home with his mother and step father   9th grade education   Last occupation was with [employer] in 2009      Unemployed at this time- thinking of filing for disability      Right handed      Social Determinants of Health   Financial Resource Strain: Not on file  Food Insecurity: Not on file  Transportation Needs: Not on file  Physical Activity: Not on file  Stress: Not on file  Social Connections: Not on file  Intimate Partner Violence: Not on file    Review of Systems   Objective:   Vitals:   09/04/21 1134  BP: 128/76  Pulse: (!) 57  Resp: 17  Temp: 98.1 F (36.7 C)  TempSrc: Temporal  SpO2: 95%  Weight: (!) 331 lb (150.1 kg)  Height: '5\' 9"'$  (1.753 m)     Physical Exam Vitals reviewed.  Constitutional:      General: He is not in acute distress.    Appearance: He is well-developed. He is not ill-appearing, toxic-appearing or  diaphoretic.  HENT:     Head: Normocephalic and atraumatic.     Right Ear: Tympanic membrane, ear canal and external ear normal.     Left Ear: Tympanic membrane, ear canal and external ear normal.  Neck:     Vascular: No carotid bruit or JVD.  Cardiovascular:     Rate and Rhythm: Normal rate and regular rhythm.     Heart sounds: Normal heart sounds. No murmur heard. Pulmonary:     Effort: Pulmonary effort is normal.     Breath sounds: Normal breath sounds. No rales.  Musculoskeletal:     Right lower leg: No edema.     Left lower leg: No edema.  Skin:    General: Skin is warm and dry.  Neurological:     Mental Status: He is alert and oriented to person, place, and time.     GCS: GCS eye subscore is 4. GCS verbal subscore is 5. GCS motor subscore is 6.     Cranial Nerves: No cranial nerve deficit or facial asymmetry.     Motor: No weakness, tremor or pronator drift.     Coordination: Coordination is intact.  Psychiatric:        Mood and Affect: Mood normal.       Assessment & Plan:  Juan Kline is a 51 y.o. male . Malaise - Plan: Comprehensive metabolic panel  -Could be multifactorial.  Check CMP, magnesium level given borderline readings previously.  Reports longstanding symptoms, most notably with the tinnitus.  ENT eval cost prohibitive.  Insurance card/orange card advised to call to find out who may be in network or can be covered with that visit.  Can place referral to ENT once I know who may be most cost effective.  Encouraged by longstanding symptoms without acute changes, and reported prior neurologic eval. PVCs improved, less likely cardiac.  Recheck next few weeks, with RTC/ER precautions if acute changes.  DOE (dyspnea on exertion) - Plan: DG Chest 2 View, CANCELED: DG Chest 2 View  -Intermittent symptoms as above.  Reassuring stress testing.  Recommended he discuss the symptoms further with pulmonary and cardiology to decide on further testing if needed.  Continue  follow-up with pulmonary regarding treatment  of OSA, which potentially could impact malaise above.  Check chest x-ray  Palpitations  -Improved back on flecainide.  Continue same regimen with electrophysiology follow-up as planned.  Type 2 diabetes mellitus with diabetic polyneuropathy, without long-term current use of insulin ([place]) - Plan: Hemoglobin A1c  -Updated A1c ordered.  Continue statin, ARB.  Medication adjustments depending on results.  OSA (obstructive sleep apnea)  -As above, follow-up with pulmonary regarding settings for BiPAP.  Essential hypertension - Plan: amLODipine (NORVASC) 10 MG tablet, losartan (COZAAR) 100 MG tablet  -Stable, continue amlodipine, losartan same dose with labs as above  Hypomagnesemia - Plan: Comprehensive metabolic panel, Magnesium  -Most recent testing was normal.  Repeat labs  Gastroesophageal reflux disease, unspecified whether esophagitis present - Plan: omeprazole (PRILOSEC) 20 MG capsule  -Stable with intermittent PPI dosing.  Continue same  Meds ordered this encounter  Medications   amLODipine (NORVASC) 10 MG tablet    Sig: Take 1 tablet by mouth daily.    Dispense:  90 tablet    Refill:  1   losartan (COZAAR) 100 MG tablet    Sig: Take 1 tablet by mouth daily.    Dispense:  90 tablet    Refill:  1    Stop Losartan/HCTZ   omeprazole (PRILOSEC) 20 MG capsule    Sig: Take 1 capsule by mouth daily as needed (indigestion).    Dispense:  90 capsule    Refill:  1   Patient Instructions  Call your cardiologist about the shortness of breath with activity. Stress test in January was reassuring, but can discuss if other testing needed.  I also recommend discussing the symptoms with your lung specialist.  I ordered a chest x-ray, please have that performed at the [facility] location.   [facility] in 8:30-4:30 during weekdays, no appointment needed [place].  [place], [place] [address]   I would recommend meeting with an ENT about  the ringing in the ears.  Call your insurance to see who is in network for ENT, and let me know so I can place a referral.  I will check blood tests today.    Return to the clinic or go to the nearest emergency room if any of your symptoms worsen or new symptoms occur.       Signed,   Merri Ray, MD [place] 09/04/21 12:58 PM

## 2021-09-09 ENCOUNTER — Other Ambulatory Visit: Payer: Self-pay | Admitting: Internal Medicine

## 2021-09-09 DIAGNOSIS — E1142 Type 2 diabetes mellitus with diabetic polyneuropathy: Secondary | ICD-10-CM

## 2021-09-10 ENCOUNTER — Other Ambulatory Visit: Payer: Self-pay

## 2021-09-10 ENCOUNTER — Other Ambulatory Visit (HOSPITAL_COMMUNITY): Payer: Self-pay

## 2021-09-10 MED ORDER — ATORVASTATIN CALCIUM 40 MG PO TABS
60.0000 mg | ORAL_TABLET | Freq: Every day | ORAL | 2 refills | Status: DC
  Filled 2021-09-10 (×2): qty 45, 30d supply, fill #0
  Filled 2021-10-06: qty 45, 30d supply, fill #1
  Filled 2021-10-07: qty 45, 30d supply, fill #0
  Filled 2021-11-05: qty 45, 30d supply, fill #1

## 2021-09-11 ENCOUNTER — Ambulatory Visit: Payer: No Typology Code available for payment source | Admitting: Family Medicine

## 2021-09-11 ENCOUNTER — Other Ambulatory Visit: Payer: Self-pay

## 2021-09-17 ENCOUNTER — Other Ambulatory Visit (HOSPITAL_COMMUNITY): Payer: Self-pay

## 2021-09-17 ENCOUNTER — Other Ambulatory Visit: Payer: Self-pay

## 2021-09-18 ENCOUNTER — Other Ambulatory Visit (HOSPITAL_COMMUNITY): Payer: Self-pay

## 2021-09-27 ENCOUNTER — Ambulatory Visit: Payer: No Typology Code available for payment source | Admitting: Family Medicine

## 2021-09-28 ENCOUNTER — Other Ambulatory Visit (HOSPITAL_COMMUNITY): Payer: Self-pay

## 2021-09-30 ENCOUNTER — Encounter: Payer: Self-pay | Admitting: Internal Medicine

## 2021-10-06 NOTE — Progress Notes (Deleted)
Cardiology Office Note Date:  10/06/2021  Patient ID:  Juan Kline, DOB 1970/05/19, MRN [id number] PCP:  Wendie Agreste, MD  Cardiologist; Dr. Debara Pickett Electrophysiologist: Dr. Lovena Le  ***refresh   Chief Complaint: *** flecainide visit, ? Increase dose  History of Present Illness: Juan Kline is a 51 y.o. male with history of HTN, HLD, PVCs, OSA, COPD, DM, obesity, venous stasis ulcers, former ETOH/tobacco abuse.  Flecainide started 12//8/22 for PVCs BASELINE  PR interval about 332m  He saw A. Duke, PA-C 04/16/21 f/u after flecainide, EKG done noted developed 1st degree AVblock of 306, in d/w Dr. TLovena Le with no improvement and was stopped  He come sin today to be seen for Dr. TLovena Le last seen by him March 2023, despite a low PVC burden of only 4%, he was symptomatic and had been started on Flecainide The patient had no improvement and self stopped the medication.  Subsequently felt better and maintained OFF drug unless his PVCs returned  Pt messaged 07/16/21 with recurrent PVCs and resumed his flecainide '50mg'$  BID EKG was done.  April a number of my chart discussions about his CPAP with pulm/PMD Saw his PMD for malaise 5/24, sounded chronic by , tinnitus, ?migraine Unable to see ENT with minimal health coverage Planned for labs Symptoms sounded chronic, advised further d/w pulmonary and cardiology Palpitations described as improved  Pt messaged 6/19 with intermittent increased burden of PVCs, inquired about up titrating dose.  *** flecainide, EKG, nodal blocker *** would be cautious about higher dosing *** sotalol?? *** CPAP??? Yet ???  this may help   Past Medical History:  Diagnosis Date   Aortic atherosclerosis ([place]    Chronic foot ulcer ([place] admitted 03/02/2014   medial border first metatarsal left foot  - resolved per patient 03/20/20   Diabetes ([place]    type 2 - no meds, diet controlled   Diabetic foot infection ([place]    First degree AV block    GAD (generalized  anxiety disorder)    GERD (gastroesophageal reflux disease)    Hx of migraines    Hyperlipidemia    Hypertension Dx 2009   Mild depression    Osteomyelitis left first metatarsal 03/02/2014   Sleep apnea    not using CPAP- machine being looked at for repair 03/21/20    Venous stasis ulcer of left lower extremity ([facility]    /[name]11/19/2015 - resolved per patient 03/20/20    Past Surgical History:  Procedure Laterality Date   BIOPSY  08/18/2019   Procedure: BIOPSY;  Surgeon: BThornton Park MD;  [facility];  Service: Gastroenterology;;   ESOPHAGOGASTRODUODENOSCOPY (EGD) WITH PROPOFOL N/A 08/18/2019   Procedure: ESOPHAGOGASTRODUODENOSCOPY (EGD) WITH PROPOFOL;  Surgeon: BThornton Park MD;  [facility];  Service: Gastroenterology;  Laterality: N/A;    Current Outpatient Medications  Medication Sig Dispense Refill   amLODipine (NORVASC) 10 MG tablet Take 1 tablet by mouth daily. 90 tablet 1   aspirin 81 MG EC tablet Take 1 tablet (81 mg total) by mouth daily. Swallow whole. 30 tablet 11   atorvastatin (LIPITOR) 40 MG tablet Take 1 and 1/2 tablets by mouth daily. 45 tablet 2   Fenofibrate 120 MG TABS Take 1 tablet by mouth once daily. 90 tablet 3   flecainide (TAMBOCOR) 50 MG tablet Take 1 tablet (50 mg total) by mouth 2 (two) times daily. 60 tablet 11   glucose blood test strip Check blood sugar once daily fasting or 2 hours after a meal 100 each 12  losartan (COZAAR) 100 MG tablet Take 1 tablet by mouth daily. 90 tablet 1   Magnesium 400 MG CAPS Take 600 mg by mouth daily.     omeprazole (PRILOSEC) 20 MG capsule Take 1 capsule by mouth daily as needed (indigestion). 90 capsule 1   sertraline (ZOLOFT) 100 MG tablet TAKE 1.5 TABLETS (150 MG TOTAL) BY MOUTH DAILY. 45 tablet 11   No current facility-administered medications for this visit.    Allergies:   Lyrica [pregabalin]   Social History:  The patient  reports that he quit smoking about 7 years ago. His  smoking use included e-cigarettes and cigarettes. He started smoking about 34 years ago. He has a 39.00 pack-year smoking history. He has never used smokeless tobacco. He reports that he does not currently use alcohol. He reports that he does not use drugs.   Family History:  The patient's family history includes COPD in his maternal grandmother; Diabetes in his brother, maternal grandfather, and mother; Heart attack in his paternal grandmother; Heart disease in his maternal grandmother and paternal grandfather; Hyperlipidemia in his brother, father, and mother; Hypertension in his brother, father, and mother; Kidney disease in his mother.  ROS:  Please see the history of present illness.    All other systems are reviewed and otherwise negative.   PHYSICAL EXAM:  VS:  There were no vitals taken for this visit. BMI: There is no height or weight on file to calculate BMI. Well nourished, well developed, in no acute distress HEENT: normocephalic, atraumatic Neck: no JVD, carotid bruits or masses Cardiac:  *** RRR; no significant murmurs, no rubs, or gallops Lungs:  *** CTA b/l, no wheezing, rhonchi or rales Abd: soft, nontender MS: no deformity or *** atrophy Ext: *** no edema Skin: warm and dry, no rash Neuro:  No gross deficits appreciated Psych: euthymic mood, full affect   EKG:  Done today and reviewed by myself shows  ***   04/26/2021: stress myoview   The study is normal. The study is low risk.   No ST deviation was noted.   LV perfusion is normal. There is no evidence of ischemia. There is no evidence of infarction.   Left ventricular function is normal. Nuclear stress EF: 55 %. The left ventricular ejection fraction is normal (55-65%). End diastolic cavity size is normal.   Prior study not available for comparison.   Reduced apical counts with normal wall motion consistent with apical thinning artifact.  Normal study without ischemia or infarction.  Normal LVEF=55%.  This is a  low-risk study.    01/04/21: TTE  1. Left ventricular ejection fraction, by estimation, is 60 to 65%. The  left ventricle has normal function. The left ventricle has no regional  wall motion abnormalities. There is mild concentric left ventricular  hypertrophy. Indeterminate diastolic  filling due to E-A fusion.   2. Right ventricular systolic function is low normal. The right  ventricular size is mildly enlarged. Tricuspid regurgitation signal is  inadequate for assessing PA pressure.   3. Left atrial size was mildly dilated.   4. Right atrial size was mildly dilated.   5. The mitral valve is grossly normal. No evidence of mitral valve  regurgitation. No evidence of mitral stenosis. Moderate mitral annular  calcification.   6. The aortic valve was not well visualized. Aortic valve regurgitation  is not visualized. No aortic stenosis is present.   7. The inferior vena cava is normal in size with greater than 50%  respiratory variability,  suggesting right atrial pressure of 3 mmHg.   Comparison(s): Prior images reviewed side by side.   Conclusion(s)/Recommendation(s): Otherwise normal echocardiogram, with  minor abnormalities described in the report.   Recent Labs: 01/04/2021: B Natriuretic Peptide 14.8; TSH 0.944 01/05/2021: Hemoglobin 13.7; Platelets 239 09/04/2021: ALT 23; BUN 28; Creatinine, Ser 0.94; Magnesium 1.8; Potassium 4.4; Sodium 137  03/28/2021: Cholesterol 168; HDL 43.80; LDL Cholesterol 89; Total CHOL/HDL Ratio 4; Triglycerides 179.0; VLDL 35.8   CrCl cannot be calculated (Patient's most recent lab result is older than the maximum 21 days allowed.).   Wt Readings from Last 3 Encounters:  09/04/21 (!) 331 lb (150.1 kg)  07/25/21 (!) 332 lb 3.2 oz (150.7 kg)  07/25/21 (!) 331 lb (150.1 kg)     Other studies reviewed: Additional studies/records reviewed today include: summarized above  ASSESSMENT AND PLAN:  PVCs  Disposition: F/u with ***  Current medicines  are reviewed at length with the patient today.  The patient did not have any concerns regarding medicines.  Venetia Night, PA-C 10/06/2021 2:41 PM     [place] [address] [place] [address] [phone] (office)  [phone] (fax)

## 2021-10-07 ENCOUNTER — Other Ambulatory Visit (HOSPITAL_COMMUNITY): Payer: Self-pay

## 2021-10-07 ENCOUNTER — Other Ambulatory Visit: Payer: Self-pay

## 2021-10-09 ENCOUNTER — Ambulatory Visit: Payer: Self-pay | Admitting: Physician Assistant

## 2021-10-10 ENCOUNTER — Other Ambulatory Visit: Payer: Self-pay

## 2021-10-14 ENCOUNTER — Other Ambulatory Visit (HOSPITAL_COMMUNITY): Payer: Self-pay

## 2021-10-14 ENCOUNTER — Other Ambulatory Visit: Payer: Self-pay

## 2021-10-16 ENCOUNTER — Ambulatory Visit: Payer: No Typology Code available for payment source | Admitting: Skilled Nursing Facility1

## 2021-10-16 ENCOUNTER — Other Ambulatory Visit: Payer: Self-pay

## 2021-10-16 ENCOUNTER — Ambulatory Visit: Payer: No Typology Code available for payment source | Admitting: Dietician

## 2021-10-20 NOTE — Progress Notes (Unsigned)
Cardiology Office Note Date:  10/20/2021  Patient ID:  Juan Kline, DOB 11-May-1970, MRN [id number] PCP:  Wendie Agreste, MD  Cardiologist; Dr. Debara Pickett Electrophysiologist: Dr. Lovena Le  ***refresh   Chief Complaint: *** flecainide visit, ? Increase dose  History of Present Illness: Juan Kline is a 51 y.o. male with history of HTN, HLD, PVCs, OSA, COPD, DM, obesity, venous stasis ulcers, former ETOH/tobacco abuse.  Flecainide started 12//8/22 for PVCs BASELINE  PR interval about 330m  He saw A. Duke, PA-C 04/16/21 f/u after flecainide, EKG done noted developed 1st degree AVblock of 306, in d/w Dr. TLovena Le with no improvement and was stopped  He come sin today to be seen for Dr. TLovena Le last seen by him March 2023, despite a low PVC burden of only 4%, he was symptomatic and had been started on Flecainide The patient had no improvement and self stopped the medication.  Subsequently felt better and maintained OFF drug unless his PVCs returned  Pt messaged 07/16/21 with recurrent PVCs and resumed his flecainide '50mg'$  BID EKG was done.  April a number of my chart discussions about his CPAP with pulm/PMD Saw his PMD for malaise 5/24, sounded chronic by , tinnitus, ?migraine Unable to see ENT with minimal health coverage Planned for labs Symptoms sounded chronic, advised further d/w pulmonary and cardiology Palpitations described as improved  Pt messaged 6/19 with intermittent increased burden of PVCs, inquired about up titrating dose.  *** flecainide, EKG, nodal blocker *** would be cautious about higher dosing *** sotalol?? *** CPAP??? Yet ???  this may help   Past Medical History:  Diagnosis Date   Aortic atherosclerosis ([facility]    Chronic foot ulcer ([facility] admitted 03/02/2014   medial border first metatarsal left foot  - resolved per patient 03/20/20   Diabetes ([facility]    type 2 - no meds, diet controlled   Diabetic foot infection ([facility]    First degree AV block    GAD (generalized  anxiety disorder)    GERD (gastroesophageal reflux disease)    Hx of migraines    Hyperlipidemia    Hypertension Dx 2009   Mild depression    Osteomyelitis left first metatarsal 03/02/2014   Sleep apnea    not using CPAP- machine being looked at for repair 03/21/20    Venous stasis ulcer of left lower extremity ([facility]    /[name]11/19/2015 - resolved per patient 03/20/20    Past Surgical History:  Procedure Laterality Date   BIOPSY  08/18/2019   Procedure: BIOPSY;  Surgeon: BThornton Park MD;  Location: [facility];  Service: Gastroenterology;;   ESOPHAGOGASTRODUODENOSCOPY (EGD) WITH PROPOFOL N/A 08/18/2019   Procedure: ESOPHAGOGASTRODUODENOSCOPY (EGD) WITH PROPOFOL;  Surgeon: BThornton Park MD;  Location: [facility];  Service: Gastroenterology;  Laterality: N/A;    Current Outpatient Medications  Medication Sig Dispense Refill   amLODipine (NORVASC) 10 MG tablet Take 1 tablet by mouth daily. 90 tablet 1   aspirin 81 MG EC tablet Take 1 tablet (81 mg total) by mouth daily. Swallow whole. 30 tablet 11   atorvastatin (LIPITOR) 40 MG tablet Take 1 and 1/2 tablets by mouth daily. 45 tablet 2   Fenofibrate 120 MG TABS Take 1 tablet by mouth once daily. 90 tablet 3   flecainide (TAMBOCOR) 50 MG tablet Take 1 tablet (50 mg total) by mouth 2 (two) times daily. 60 tablet 11   glucose blood test strip Check blood sugar once daily fasting or 2 hours after a meal 100 each 12  losartan (COZAAR) 100 MG tablet Take 1 tablet by mouth daily. 90 tablet 1   Magnesium 400 MG CAPS Take 600 mg by mouth daily.     omeprazole (PRILOSEC) 20 MG capsule Take 1 capsule by mouth daily as needed (indigestion). 90 capsule 1   sertraline (ZOLOFT) 100 MG tablet TAKE 1.5 TABLETS (150 MG TOTAL) BY MOUTH DAILY. 45 tablet 11   No current facility-administered medications for this visit.    Allergies:   Lyrica [pregabalin]   Social History:  The patient  reports that he quit smoking about 7 years ago. His  smoking use included e-cigarettes and cigarettes. He started smoking about 34 years ago. He has a 39.00 pack-year smoking history. He has never used smokeless tobacco. He reports that he does not currently use alcohol. He reports that he does not use drugs.   Family History:  The patient's family history includes COPD in his maternal grandmother; Diabetes in his brother, maternal grandfather, and mother; Heart attack in his paternal grandmother; Heart disease in his maternal grandmother and paternal grandfather; Hyperlipidemia in his brother, father, and mother; Hypertension in his brother, father, and mother; Kidney disease in his mother.  ROS:  Please see the history of present illness.    All other systems are reviewed and otherwise negative.   PHYSICAL EXAM:  VS:  There were no vitals taken for this visit. BMI: There is no height or weight on file to calculate BMI. Well nourished, well developed, in no acute distress HEENT: normocephalic, atraumatic Neck: no JVD, carotid bruits or masses Cardiac:  *** RRR; no significant murmurs, no rubs, or gallops Lungs:  *** CTA b/l, no wheezing, rhonchi or rales Abd: soft, nontender MS: no deformity or *** atrophy Ext: *** no edema Skin: warm and dry, no rash Neuro:  No gross deficits appreciated Psych: euthymic mood, full affect   EKG:  Done today and reviewed by myself shows  ***   04/26/2021: stress myoview   The study is normal. The study is low risk.   No ST deviation was noted.   LV perfusion is normal. There is no evidence of ischemia. There is no evidence of infarction.   Left ventricular function is normal. Nuclear stress EF: 55 %. The left ventricular ejection fraction is normal (55-65%). End diastolic cavity size is normal.   Prior study not available for comparison.   Reduced apical counts with normal wall motion consistent with apical thinning artifact.  Normal study without ischemia or infarction.  Normal LVEF=55%.  This is a  low-risk study.    01/04/21: TTE  1. Left ventricular ejection fraction, by estimation, is 60 to 65%. The  left ventricle has normal function. The left ventricle has no regional  wall motion abnormalities. There is mild concentric left ventricular  hypertrophy. Indeterminate diastolic  filling due to E-A fusion.   2. Right ventricular systolic function is low normal. The right  ventricular size is mildly enlarged. Tricuspid regurgitation signal is  inadequate for assessing PA pressure.   3. Left atrial size was mildly dilated.   4. Right atrial size was mildly dilated.   5. The mitral valve is grossly normal. No evidence of mitral valve  regurgitation. No evidence of mitral stenosis. Moderate mitral annular  calcification.   6. The aortic valve was not well visualized. Aortic valve regurgitation  is not visualized. No aortic stenosis is present.   7. The inferior vena cava is normal in size with greater than 50%  respiratory variability,  suggesting right atrial pressure of 3 mmHg.   Comparison(s): Prior images reviewed side by side.   Conclusion(s)/Recommendation(s): Otherwise normal echocardiogram, with  minor abnormalities described in the report.   Recent Labs: 01/04/2021: B Natriuretic Peptide 14.8; TSH 0.944 01/05/2021: Hemoglobin 13.7; Platelets 239 09/04/2021: ALT 23; BUN 28; Creatinine, Ser 0.94; Magnesium 1.8; Potassium 4.4; Sodium 137  03/28/2021: Cholesterol 168; HDL 43.80; LDL Cholesterol 89; Total CHOL/HDL Ratio 4; Triglycerides 179.0; VLDL 35.8   CrCl cannot be calculated (Patient's most recent lab result is older than the maximum 21 days allowed.).   Wt Readings from Last 3 Encounters:  09/04/21 (!) 331 lb (150.1 kg)  07/25/21 (!) 332 lb 3.2 oz (150.7 kg)  07/25/21 (!) 331 lb (150.1 kg)     Other studies reviewed: Additional studies/records reviewed today include: summarized above  ASSESSMENT AND PLAN:  PVCs  Disposition: F/u with ***  Current medicines  are reviewed at length with the patient today.  The patient did not have any concerns regarding medicines.  Venetia Night, PA-C 10/20/2021 6:45 PM     [place] [address] [phone] (office)  [phone] (fax)

## 2021-10-23 ENCOUNTER — Ambulatory Visit (INDEPENDENT_AMBULATORY_CARE_PROVIDER_SITE_OTHER): Payer: Self-pay | Admitting: Physician Assistant

## 2021-10-23 ENCOUNTER — Encounter: Payer: Self-pay | Admitting: Physician Assistant

## 2021-10-23 ENCOUNTER — Other Ambulatory Visit (HOSPITAL_COMMUNITY): Payer: Self-pay

## 2021-10-23 ENCOUNTER — Ambulatory Visit (INDEPENDENT_AMBULATORY_CARE_PROVIDER_SITE_OTHER): Payer: Self-pay

## 2021-10-23 VITALS — BP 102/62 | HR 83 | Ht 69.0 in | Wt 332.0 lb

## 2021-10-23 DIAGNOSIS — Z5181 Encounter for therapeutic drug level monitoring: Secondary | ICD-10-CM

## 2021-10-23 DIAGNOSIS — Z79899 Other long term (current) drug therapy: Secondary | ICD-10-CM

## 2021-10-23 DIAGNOSIS — I493 Ventricular premature depolarization: Secondary | ICD-10-CM

## 2021-10-23 NOTE — Progress Notes (Unsigned)
Applied a 3 day Zio XT monitor to patient in the office 

## 2021-10-23 NOTE — Patient Instructions (Addendum)
Medication Instructions:  Your physician recommends that you continue on your current medications as directed. Please refer to the Current Medication list given to you today.  *If you need a refill on your cardiac medications before your next appointment, please call your pharmacy*   Lab Work: NONE If you have labs (blood work) drawn today and your tests are completely normal, you will receive your results only by: [place] (if you have MyChart) OR A paper copy in the mail If you have any lab test that is abnormal or we need to change your treatment, we will call you to review the results.   Testing/Procedures: [place] [place]- Long Term Monitor Instructions  Your physician has requested you wear a ZIO patch monitor for 3 days.  This is a single patch monitor. [employer] supplies one patch monitor per enrollment. Additional stickers are not available. Please do not apply patch if you will be having a Nuclear Stress Test,  Echocardiogram, Cardiac CT, MRI, or Chest Xray during the period you would be wearing the  monitor. The patch cannot be worn during these tests. You cannot remove and re-apply the  ZIO XT patch monitor.  Your ZIO patch monitor will be mailed 3 day USPS to your address on file. It may take 3-5 days  to receive your monitor after you have been enrolled.  Once you have received your monitor, please review the enclosed instructions. Your monitor  has already been registered assigning a specific monitor serial # to you.  Billing and Patient Assistance Program Information  We have supplied [employer] with any of your insurance information on file for billing purposes. [employer] offers a sliding scale Patient Assistance Program for patients that do not have  insurance, or whose insurance does not completely cover the cost of the ZIO monitor.  You must apply for the Patient Assistance Program to qualify for this discounted rate.  To apply, please call [employer] at [phone], select  option 4, select option 2, ask to apply for  Patient Assistance Program. Theodore Demark will ask your household income, and how many people  are in your household. They will quote your out-of-pocket cost based on that information.  [employer] will also be able to set up a 61-month interest-free payment plan if needed.  Applying the monitor   Shave hair from upper left chest.  Hold abrader disc by orange tab. Rub abrader in 40 strokes over the upper left chest as  indicated in your monitor instructions.  Clean area with 4 enclosed alcohol pads. Let dry.  Apply patch as indicated in monitor instructions. Patch will be placed under collarbone on left  side of chest with arrow pointing upward.  Rub patch adhesive wings for 2 minutes. Remove white label marked "1". Remove the white  label marked "2". Rub patch adhesive wings for 2 additional minutes.  While looking in a mirror, press and release button in center of patch. A small green light will  flash 3-4 times. This will be your only indicator that the monitor has been turned on.  Do not shower for the first 24 hours. You may shower after the first 24 hours.  Press the button if you feel a symptom. You will hear a small click. Record Date, Time and  Symptom in the Patient Logbook.  When you are ready to remove the patch, follow instructions on the last 2 pages of Patient  Logbook. Stick patch monitor onto the last page of Patient Logbook.  Place Patient Logbook in  the blue and white box. Use locking tab on box and tape box closed  securely. The blue and white box has prepaid postage on it. Please place it in the mailbox as  soon as possible. Your physician should have your test results approximately 7 days after the  monitor has been mailed back to [facility].  Call [place] at [phone] if you have questions regarding  your ZIO XT patch monitor. Call them immediately if you see an orange light blinking on your  monitor.   If your monitor falls off in less than 4 days, contact our Monitor department at [phone].  If your monitor becomes loose or falls off after 4 days call [employer] at [phone] for  suggestions on securing your monitor   Follow-Up: At [facility], you and your health needs are our priority.  As part of our continuing mission to provide you with exceptional heart care, we have created designated Provider Care Teams.  These Care Teams include your primary Cardiologist (physician) and Advanced Practice Providers (APPs -  Physician Assistants and Nurse Practitioners) who all work together to provide you with the care you need, when you need it.  We recommend signing up for the patient portal called "MyChart".  Sign up information is provided on this After Visit Summary.  MyChart is used to connect with patients for Virtual Visits (Telemedicine).  Patients are able to view lab/test results, encounter notes, upcoming appointments, etc.  Non-urgent messages can be sent to your provider as well.   To learn more about what you can do with MyChart, go to [email or web address].    Your next appointment:   [place], PA-C  Important Information About Sugar

## 2021-11-05 ENCOUNTER — Other Ambulatory Visit (HOSPITAL_COMMUNITY): Payer: Self-pay

## 2021-11-11 ENCOUNTER — Other Ambulatory Visit (HOSPITAL_COMMUNITY): Payer: Self-pay

## 2021-11-11 ENCOUNTER — Other Ambulatory Visit: Payer: Self-pay

## 2021-11-17 NOTE — Progress Notes (Signed)
Cardiology Office Note Date:  11/17/2021  Patient ID:  Larence Thone, DOB 06-Oct-1970, MRN [id number] PCP:  Wendie Agreste, MD  Cardiologist; Dr. Debara Pickett Electrophysiologist: Dr. Lovena Le    Chief Complaint:  planned f/u  History of Present Illness: Yehia Mcbain is a 51 y.o. male with history of HTN, HLD, PVCs, OSA, COPD, DM, obesity, venous stasis ulcers, former ETOH/tobacco abuse.  Flecainide started 12//8/22 for PVCs BASELINE  PR interval about 356m  He saw A. Duke, PA-C 04/16/21 f/u after flecainide, EKG done noted developed 1st degree AVblock of 306, in d/w Dr. TLovena Le with no improvement and was stopped  He come sin today to be seen for Dr. TLovena Le last seen by him March 2023, despite a low PVC burden of only 4%, he was symptomatic and had been started on Flecainide The patient had no improvement and self stopped the medication.  Subsequently felt better and maintained OFF drug unless his PVCs returned  Pt messaged 07/16/21 with recurrent PVCs and resumed his flecainide '50mg'$  BID EKG was done.  April a number of my chart discussions about his CPAP with pulm/PMD Saw his PMD for malaise 5/24, sounded chronic by , tinnitus, ?migraine Unable to see ENT with minimal health coverage Planned for labs Symptoms sounded chronic, advised further d/w pulmonary and cardiology Palpitations described as improved  Pt messaged 6/19 with intermittent increased burden of PVCs, inquired about up titrating dose.  I saw him 10/23/21 He is doing "OK" When he is having PVCs he is aware of them in his heart beat and they make him feel off when happening frequently In the last few weeks, he has been having them more frequently and persistently No fainting No CP He is using his CPAP machine, it has a recall and is waiting for the device company/his pulmonologist to get that squared away. Sometimes he has noticed that after he has been up and doing stuff that after he sits and relaxed he feels the PVCs, this  would be the only pattern, but not every time either. Planned to revisit his PVC burden  PVCs were rare <0.1% Had some Mobitz one, at 0700 One pt triggered event was SR, 1st degree 60's  Discussed with Dr. TLovena Le with low PVC burden would not pursue any other or different manatment and not an ablation candidate with low/controlled PVC burden.  TODAY Today he is feeling OK When he saw the NP a couple days ago he reports feeling pretty terrible, had been a bad day, HR felt all over the place and giving him weak spells/dizziness. His HR was elevated but BP was ok, and after a while sitting there did settle down. He has not fainted No CP, no SOB He feels like his HR gets fast and he feels weak/unsteady  Sometimes very weak that has to sit   Past Medical History:  Diagnosis Date   Aortic atherosclerosis ([facility]    [place]ronic foot [place] ([facility] 03/02/2014   medial border first metatarsal left foot  - resolved per patient 03/20/20   Diabetes ([facility] [place]    type 2 - no meds, diet controlled   [place] ([facility]    First degree AV block    GAD (generalized anxiety disorder)    GERD (gastroesophageal reflux disease)    Hx of migraines    Hyperlipidemia    Hypertension Dx 2009   Mild depression    Osteomyelitis left first metatarsal 03/02/2014   Sleep apnea    not using CPAP- machine  being looked at for repair 03/21/20    Venous stasis ulcer of left lower extremity ([place])    [name] 03/02/2014 - resolved per patient 03/20/20    Past Surgical History:  Procedure Laterality Date   BIOPSY  08/18/2019   Procedure: BIOPSY;  Surgeon: Thornton Park, MD;  [facility];  Service: Gastroenterology;;   ESOPHAGOGASTRODUODENOSCOPY (EGD) WITH PROPOFOL N/A 08/18/2019   Procedure: ESOPHAGOGASTRODUODENOSCOPY (EGD) WITH PROPOFOL;  Surgeon: Thornton Park, MD;  [facility];  Service: Gastroenterology;  Laterality: N/A;    Current Outpatient Medications  Medication  Sig Dispense Refill   amLODipine (NORVASC) 10 MG tablet Take 1 tablet by mouth daily. 90 tablet 1   aspirin 81 MG EC tablet Take 1 tablet (81 mg total) by mouth daily. Swallow whole. 30 tablet 11   atorvastatin (LIPITOR) 40 MG tablet Take 1 and 1/2 tablets by mouth daily. 45 tablet 2   Fenofibrate 120 MG TABS Take 1 tablet by mouth once daily. 90 tablet 3   flecainide (TAMBOCOR) 50 MG tablet Take 1 tablet (50 mg total) by mouth 2 (two) times daily. 60 tablet 11   glucose blood test strip Check blood sugar once daily fasting or 2 hours after a meal (Patient not taking: Reported on 10/23/2021) 100 each 12   losartan (COZAAR) 100 MG tablet Take 1 tablet by mouth daily. 90 tablet 1   Magnesium 400 MG CAPS Take 400 mg by mouth daily.     omeprazole (PRILOSEC) 20 MG capsule Take 1 capsule by mouth daily as needed (indigestion). 90 capsule 1   sertraline (ZOLOFT) 100 MG tablet TAKE 1.5 TABLETS (150 MG TOTAL) BY MOUTH DAILY. 45 tablet 11   No current facility-administered medications for this visit.    Allergies:   Lyrica [pregabalin]   Social History:  The patient  reports that he quit smoking about 7 years ago. His smoking use included e-cigarettes and cigarettes. He started smoking about 34 years ago. He has a 39.00 pack-year smoking history. He has never used smokeless tobacco. He reports that he does not currently use alcohol. He reports that he does not use drugs.   Family History:  The patient's family history includes COPD in his maternal grandmother; Diabetes in his brother, maternal grandfather, and mother; Heart attack in his paternal grandmother; Heart disease in his maternal grandmother and paternal grandfather; Hyperlipidemia in his brother, father, and mother; Hypertension in his brother, father, and mother; Kidney disease in his mother.  ROS:  Please see the history of present illness.    All other systems are reviewed and otherwise negative.   PHYSICAL EXAM:  VS:  There were no  vitals taken for this visit. BMI: There is no height or weight on file to calculate BMI. Well nourished, well developed, in no acute distress HEENT: normocephalic, atraumatic Neck: no JVD, carotid bruits or masses Cardiac:   RRR; no PVCs with prolonged auscultation, no significant murmurs, no rubs, or gallops Lungs:    CTA b/l, no wheezing, rhonchi or rales Abd: soft, nontender MS: no deformity or atrophy Ext:   no edema Skin: warm and dry, no rash Neuro:  No gross deficits appreciated Psych: euthymic mood, full affect   EKG:  Done today and reviewed by myself shows  SR 79bpm, 1st degree AVblock 338m, LAD  July 2023: monitor NSR with sinus brady and sinus tachycardia Marked first degree AV Block Rare periods of AVWB Rare PAC's and PVC's No Atrial fib or VT   04/26/2021: stress myoview  The study is normal. The study is low risk.   No ST deviation was noted.   LV perfusion is normal. There is no evidence of ischemia. There is no evidence of infarction.   Left ventricular function is normal. Nuclear stress EF: 55 %. The left ventricular ejection fraction is normal (55-65%). End diastolic cavity size is normal.   Prior study not available for comparison.   Reduced apical counts with normal wall motion consistent with apical thinning artifact.  Normal study without ischemia or infarction.  Normal LVEF=55%.  This is a low-risk study.    01/04/21: TTE  1. Left ventricular ejection fraction, by estimation, is 60 to 65%. The  left ventricle has normal function. The left ventricle has no regional  wall motion abnormalities. There is mild concentric left ventricular  hypertrophy. Indeterminate diastolic  filling due to E-A fusion.   2. Right ventricular systolic function is low normal. The right  ventricular size is mildly enlarged. Tricuspid regurgitation signal is  inadequate for assessing PA pressure.   3. Left atrial size was mildly dilated.   4. Right atrial size was mildly  dilated.   5. The mitral valve is grossly normal. No evidence of mitral valve  regurgitation. No evidence of mitral stenosis. Moderate mitral annular  calcification.   6. The aortic valve was not well visualized. Aortic valve regurgitation  is not visualized. No aortic stenosis is present.   7. The inferior vena cava is normal in size with greater than 50%  respiratory variability, suggesting right atrial pressure of 3 mmHg.   Comparison(s): Prior images reviewed side by side.   Conclusion(s)/Recommendation(s): Otherwise normal echocardiogram, with  minor abnormalities described in the report.   Recent Labs: 01/04/2021: B Natriuretic Peptide 14.8; TSH 0.944 01/05/2021: Hemoglobin 13.7; Platelets 239 09/04/2021: ALT 23; BUN 28; Creatinine, Ser 0.94; Magnesium 1.8; Potassium 4.4; Sodium 137  03/28/2021: Cholesterol 168; HDL 43.80; LDL Cholesterol 89; Total CHOL/HDL Ratio 4; Triglycerides 179.0; VLDL 35.8   CrCl cannot be calculated (Patient's most recent lab result is older than the maximum 21 days allowed.).   Wt Readings from Last 3 Encounters:  10/23/21 (!) 332 lb (150.6 kg)  09/04/21 (!) 331 lb (150.1 kg)  07/25/21 (!) 332 lb 3.2 oz (150.7 kg)     Other studies reviewed: Additional studies/records reviewed today include: summarized above  ASSESSMENT AND PLAN:  PVCs He does not recall any significant symptoms when wearing the last monitor He feels his hear fast, making him feel very weak, infrequently feels weak/unsteady without the palpitations. The palpitations are fast rates that can last minutes, not moments   PR is longer today Not sure what he is feeling but with longer PR, LAD and some Mobitz one on flecainide I will stop it now. Might consider another monitor if fast rates continue, perhaps may benefit from a loop implant   Monitor symptoms off flecainide See him back in a few weeks. Sooner if needed     Disposition: as above, sooner if needed   Current  medicines are reviewed at length with the patient today.  The patient did not have any concerns regarding medicines.  Venetia Night, PA-C 11/17/2021 2:39 PM     [place] [employer] [place] [address] [phone] (office)  [phone] (fax)

## 2021-11-18 ENCOUNTER — Other Ambulatory Visit: Payer: Self-pay

## 2021-11-18 ENCOUNTER — Ambulatory Visit: Payer: Self-pay | Attending: Nurse Practitioner | Admitting: Nurse Practitioner

## 2021-11-18 ENCOUNTER — Other Ambulatory Visit (HOSPITAL_COMMUNITY): Payer: Self-pay

## 2021-11-18 ENCOUNTER — Encounter: Payer: Self-pay | Admitting: Nurse Practitioner

## 2021-11-18 VITALS — BP 129/84 | HR 110 | Temp 98.0°F | Wt 333.2 lb

## 2021-11-18 DIAGNOSIS — E1142 Type 2 diabetes mellitus with diabetic polyneuropathy: Secondary | ICD-10-CM

## 2021-11-18 DIAGNOSIS — R002 Palpitations: Secondary | ICD-10-CM

## 2021-11-18 DIAGNOSIS — R42 Dizziness and giddiness: Secondary | ICD-10-CM

## 2021-11-18 DIAGNOSIS — Z6841 Body Mass Index (BMI) 40.0 and over, adult: Secondary | ICD-10-CM

## 2021-11-18 MED ORDER — TRULICITY 0.75 MG/0.5ML ~~LOC~~ SOAJ
0.7500 mg | SUBCUTANEOUS | 1 refills | Status: DC
  Filled 2021-11-18: qty 2, 28d supply, fill #0
  Filled 2021-12-13 (×2): qty 2, 28d supply, fill #1

## 2021-11-18 MED ORDER — ATORVASTATIN CALCIUM 40 MG PO TABS
60.0000 mg | ORAL_TABLET | Freq: Every day | ORAL | 6 refills | Status: DC
  Filled 2021-11-18 – 2021-12-03 (×2): qty 45, 30d supply, fill #0
  Filled 2021-12-29 – 2021-12-30 (×2): qty 45, 30d supply, fill #1
  Filled 2022-01-29: qty 45, 30d supply, fill #2
  Filled 2022-03-03: qty 45, 30d supply, fill #3
  Filled 2022-03-28: qty 45, 30d supply, fill #4
  Filled 2022-04-29: qty 45, 30d supply, fill #5
  Filled 2022-06-02 (×2): qty 45, 30d supply, fill #6

## 2021-11-18 NOTE — Progress Notes (Unsigned)
Assessment & Plan:  There are no diagnoses linked to this encounter.  Patient has been counseled on age-appropriate routine health concerns for screening and prevention. These are reviewed and up-to-date. Referrals have been placed accordingly. Immunizations are up-to-date or declined.    Subjective:   Chief Complaint  Patient presents with   Diabetes   HPI Juan Kline 51 y.o. male presents to office today    Been taking flecainide for several months.  Causes shortness of breath, fatigue  BP Readings from Last 3 Encounters:  11/18/21 129/84  10/23/21 102/62  09/04/21 128/76    ROS  Past Medical History:  Diagnosis Date   Aortic atherosclerosis (HCC)    Chronic foot ulcer ([place]) admitted 03/02/2014   medial border first metatarsal left foot  - resolved per patient 03/20/20   Diabetes ([place])    type 2 - no meds, diet controlled   Diabetic foot infection ([place])    First degree AV block    GAD (generalized anxiety disorder)    GERD (gastroesophageal reflux disease)    Hx of migraines    Hyperlipidemia    Hypertension Dx 2009   Mild depression    Osteomyelitis left first metatarsal 03/02/2014   Sleep apnea    not using CPAP- machine being looked at for repair 03/21/20    Venous stasis ulcer of left lower extremity ([place])    [name] 03/02/2014 - resolved per patient 03/20/20    Past Surgical History:  Procedure Laterality Date   BIOPSY  08/18/2019   Procedure: BIOPSY;  Surgeon: Thornton Park, MD;  Location: [facility];  Service: Gastroenterology;;   ESOPHAGOGASTRODUODENOSCOPY (EGD) WITH PROPOFOL N/A 08/18/2019   Procedure: ESOPHAGOGASTRODUODENOSCOPY (EGD) WITH PROPOFOL;  Surgeon: Thornton Park, MD;  [facility];  Service: Gastroenterology;  Laterality: N/A;    Family History  Problem Relation Age of Onset   Hypertension Mother    Hyperlipidemia Mother    Diabetes Mother    Kidney disease Mother    Hypertension Father    Hyperlipidemia Father     Diabetes Brother    Hypertension Brother    Hyperlipidemia Brother    Heart disease Maternal Grandmother    COPD Maternal Grandmother    Diabetes Maternal Grandfather    Heart attack Paternal Grandmother    Heart disease Paternal Grandfather    Colon cancer Neg Hx    Colon polyps Neg Hx    Stomach cancer Neg Hx    Esophageal cancer Neg Hx    Ulcerative colitis Neg Hx     Social History Reviewed with no changes to be made today.   Outpatient Medications Prior to Visit  Medication Sig Dispense Refill   amLODipine (NORVASC) 10 MG tablet Take 1 tablet by mouth daily. 90 tablet 1   aspirin 81 MG EC tablet Take 1 tablet (81 mg total) by mouth daily. Swallow whole. 30 tablet 11   atorvastatin (LIPITOR) 40 MG tablet Take 1 and 1/2 tablets by mouth daily. 45 tablet 2   Fenofibrate 120 MG TABS Take 1 tablet by mouth once daily. 90 tablet 3   flecainide (TAMBOCOR) 50 MG tablet Take 1 tablet (50 mg total) by mouth 2 (two) times daily. 60 tablet 11   glucose blood test strip Check blood sugar once daily fasting or 2 hours after a meal 100 each 12   losartan (COZAAR) 100 MG tablet Take 1 tablet by mouth daily. 90 tablet 1   Magnesium 400 MG CAPS Take 400 mg by mouth daily.  omeprazole (PRILOSEC) 20 MG capsule Take 1 capsule by mouth daily as needed (indigestion). 90 capsule 1   sertraline (ZOLOFT) 100 MG tablet TAKE 1.5 TABLETS (150 MG TOTAL) BY MOUTH DAILY. 45 tablet 11   No facility-administered medications prior to visit.    Allergies  Allergen Reactions   Lyrica [Pregabalin] Anxiety       Objective:    BP 129/84   Pulse (!) 110   Temp 98 F (36.7 C) (Oral)   Wt (!) 333 lb 3.2 oz (151.1 kg)   SpO2 96%   BMI 49.21 kg/m  Wt Readings from Last 3 Encounters:  11/18/21 (!) 333 lb 3.2 oz (151.1 kg)  10/23/21 (!) 332 lb (150.6 kg)  09/04/21 (!) 331 lb (150.1 kg)    Physical Exam       Patient has been counseled extensively about nutrition and exercise as well as the  importance of adherence with medications and regular follow-up. The patient was given clear instructions to go to ER or return to medical center if symptoms don't improve, worsen or new problems develop. The patient verbalized understanding.   Follow-up: No follow-ups on file.   Gildardo Pounds, FNP-BC [facility] [place], [place]   11/18/2021, 2:21 PM

## 2021-11-18 NOTE — Progress Notes (Signed)
No concerns. 

## 2021-11-19 ENCOUNTER — Encounter: Payer: Self-pay | Admitting: Nurse Practitioner

## 2021-11-19 ENCOUNTER — Other Ambulatory Visit (HOSPITAL_COMMUNITY): Payer: Self-pay

## 2021-11-19 LAB — THYROID PANEL WITH TSH
Free Thyroxine Index: 1.8 (ref 1.2–4.9)
T3 Uptake Ratio: 22 % — ABNORMAL LOW (ref 24–39)
T4, Total: 8.4 ug/dL (ref 4.5–12.0)
TSH: 2.31 u[IU]/mL (ref 0.450–4.500)

## 2021-11-20 ENCOUNTER — Ambulatory Visit (INDEPENDENT_AMBULATORY_CARE_PROVIDER_SITE_OTHER): Payer: Self-pay | Admitting: Physician Assistant

## 2021-11-20 ENCOUNTER — Encounter: Payer: Self-pay | Admitting: Physician Assistant

## 2021-11-20 ENCOUNTER — Other Ambulatory Visit (HOSPITAL_COMMUNITY): Payer: Self-pay

## 2021-11-20 VITALS — BP 108/50 | HR 79 | Ht 69.0 in | Wt 334.0 lb

## 2021-11-20 DIAGNOSIS — Z5181 Encounter for therapeutic drug level monitoring: Secondary | ICD-10-CM

## 2021-11-20 DIAGNOSIS — R002 Palpitations: Secondary | ICD-10-CM

## 2021-11-20 DIAGNOSIS — Z79899 Other long term (current) drug therapy: Secondary | ICD-10-CM

## 2021-11-20 DIAGNOSIS — I493 Ventricular premature depolarization: Secondary | ICD-10-CM

## 2021-11-20 NOTE — Patient Instructions (Signed)
Medication Instructions:    STOP TAKING AND REMOVE THIS MEDICATION FROM YOUR MEDICATION LIST: FLECAINIDE   *If you need a refill on your cardiac medications before your next appointment, please call your pharmacy*   Lab Work: NONE ORDERED  TODAY   If you have labs (blood work) drawn today and your tests are completely normal, you will receive your results only by: [place] (if you have MyChart) OR A paper copy in the mail If you have any lab test that is abnormal or we need to change your treatment, we will call you to review the results.   Testing/Procedures: NONE ORDERED  TODAY   Follow-Up: At [facility], you and your health needs are our priority.  As part of our continuing mission to provide you with exceptional heart care, we have created designated Provider Care Teams.  These Care Teams include your primary Cardiologist (physician) and Advanced Practice Providers (APPs -  Physician Assistants and Nurse Practitioners) who all work together to provide you with the care you need, when you need it.  We recommend signing up for the patient portal called "MyChart".  Sign up information is provided on this After Visit Summary.  MyChart is used to connect with patients for Virtual Visits (Telemedicine).  Patients are able to view lab/test results, encounter notes, upcoming appointments, etc.  Non-urgent messages can be sent to your provider as well.   To learn more about what you can do with MyChart, go to [email or web address].    Your next appointment:   2 week(s)  The format for your next appointment:   In Person  Provider:   You will see one of the following Advanced Practice Providers on your designated Care Team:   Tommye Standard, [place]       Other Instructions   Important Information About Sugar

## 2021-11-21 ENCOUNTER — Other Ambulatory Visit: Payer: Self-pay

## 2021-11-21 ENCOUNTER — Other Ambulatory Visit (HOSPITAL_COMMUNITY): Payer: Self-pay

## 2021-11-21 ENCOUNTER — Other Ambulatory Visit: Payer: Self-pay | Admitting: Internal Medicine

## 2021-11-21 MED ORDER — FENOFIBRATE 145 MG PO TABS
145.0000 mg | ORAL_TABLET | Freq: Every day | ORAL | 3 refills | Status: DC
  Filled 2021-11-21: qty 90, 90d supply, fill #0
  Filled 2022-02-17: qty 90, 90d supply, fill #1
  Filled 2022-05-15: qty 90, 90d supply, fill #2
  Filled 2022-08-13: qty 90, 90d supply, fill #3

## 2021-11-23 ENCOUNTER — Encounter: Payer: Self-pay | Admitting: Nurse Practitioner

## 2021-11-24 ENCOUNTER — Encounter: Payer: Self-pay | Admitting: Internal Medicine

## 2021-11-25 ENCOUNTER — Encounter: Payer: Self-pay | Admitting: Nurse Practitioner

## 2021-11-25 ENCOUNTER — Other Ambulatory Visit: Payer: Self-pay | Admitting: Nurse Practitioner

## 2021-11-25 DIAGNOSIS — R42 Dizziness and giddiness: Secondary | ICD-10-CM

## 2021-12-02 NOTE — Progress Notes (Signed)
PCP:  Gildardo Pounds, NP Primary Cardiologist: Pixie Casino, MD Electrophysiologist: Cristopher Peru, MD   Juan Kline is a 51 y.o. male seen today for Cristopher Peru, MD for routine electrophysiology followup.   Seen 8/9 by EP APP and flecainide stopped with prolonged PR interval as well as some Mobitz 1 on flecainide.    Since last being seen in our clinic the patient reports doing about the same. He has felt better OFF flecainide. He has sleep apnea and uses his BiPAP nightly. He is tying to lose weight, but has more palpitations with exertion, so is limited. He is on trulicity. He feels frustrated that any time we see him his heart isn't "acting up". He verbalizes understanding that no dangerous arryhtmias have been noted on his monitoring.   Past Medical History:  Diagnosis Date   Aortic atherosclerosis ([place])    Chronic foot ulcer ([place]) admitted 03/02/2014   medial border first metatarsal left foot  - resolved per patient 03/20/20   Diabetes ([place])    type 2 - no meds, diet controlled   Diabetic foot infection ([place])    First degree AV block    GAD (generalized anxiety disorder)    GERD (gastroesophageal reflux disease)    Hx of migraines    Hyperlipidemia    Hypertension Dx 2009   Mild depression    Osteomyelitis left first metatarsal 03/02/2014   Sleep apnea    not using CPAP- machine being looked at for repair 03/21/20    Venous stasis ulcer of left lower extremity ([place])    [name] 03/02/2014 - resolved per patient 03/20/20   Past Surgical History:  Procedure Laterality Date   BIOPSY  08/18/2019   Procedure: BIOPSY;  Surgeon: Thornton Park, MD;  [facility];  Service: Gastroenterology;;   ESOPHAGOGASTRODUODENOSCOPY (EGD) WITH PROPOFOL N/A 08/18/2019   Procedure: ESOPHAGOGASTRODUODENOSCOPY (EGD) WITH PROPOFOL;  Surgeon: Thornton Park, MD;  [facility];  Service: Gastroenterology;  Laterality: N/A;    Current Outpatient Medications  Medication  Sig Dispense Refill   amLODipine (NORVASC) 10 MG tablet Take 1 tablet by mouth daily. 90 tablet 1   aspirin 81 MG EC tablet Take 1 tablet (81 mg total) by mouth daily. Swallow whole. 30 tablet 11   atorvastatin (LIPITOR) 40 MG tablet Take 1 and 1/2 tablets by mouth daily. 45 tablet 6   doxycycline (VIBRA-TABS) 100 MG tablet Take 1 tablet (100 mg total) by mouth 2 (two) times daily for 10 days. 20 tablet 0   Dulaglutide (TRULICITY) 0.94 BS/9.6GE SOPN Inject 0.75 mg into the skin once a week. 2 mL 1   fenofibrate (TRICOR) 145 MG tablet Take 1 tablet (145 mg total) by mouth daily. 90 tablet 3   glucose blood test strip Check blood sugar once daily fasting or 2 hours after a meal 100 each 12   losartan (COZAAR) 100 MG tablet Take 1 tablet by mouth daily. 90 tablet 1   Magnesium 400 MG CAPS Take 400 mg by mouth daily.     omeprazole (PRILOSEC) 20 MG capsule Take 1 capsule by mouth daily as needed (indigestion). 90 capsule 1   sertraline (ZOLOFT) 100 MG tablet TAKE 1.5 TABLETS (150 MG TOTAL) BY MOUTH DAILY. 45 tablet 11   No current facility-administered medications for this visit.    Allergies  Allergen Reactions   Lyrica [Pregabalin] Anxiety    Social History   Socioeconomic History   Marital status: Single    Spouse name: Not on  file   Number of children: 0   Years of education: Not on file   Highest education level: Not on file  Occupational History   Not on file  Tobacco Use   Smoking status: Former    Packs/day: 1.50    Years: 26.00    Total pack years: 39.00    Types: E-cigarettes, Cigarettes    Start date: 3    Quit date: 02/16/2014    Years since quitting: 7.8   Smokeless tobacco: Never   Tobacco comments:    occasional vapes - no nicotine  Vaping Use   Vaping Use: Every day   Start date: 04/15/2015   Last attempt to quit: 04/11/2019   Substances: Nicotine  Substance and Sexual Activity   Alcohol use: Not Currently    Comment: None 03/28/21, 03/02/2014 "I rarely  drink"   Drug use: No   Sexual activity: Not Currently  Other Topics Concern   Not on file  Social History Narrative   Pt lives in 1 story home with his mother and step father   9th grade education   Last occupation was with [employer] in 2009      Unemployed at this time- thinking of filing for disability      Right handed      Social Determinants of Health   Financial Resource Strain: Not on file  Food Insecurity: Not on file  Transportation Needs: Not on file  Physical Activity: Not on file  Stress: Not on file  Social Connections: Not on file  Intimate Partner Violence: Not on file     Review of Systems: All other systems reviewed and are otherwise negative except as noted above.  Physical Exam: Vitals:   12/11/21 1024  BP: 132/78  Pulse: 85  SpO2: 97%  Weight: (!) 335 lb (152 kg)  Height: '5\' 9"'$  (1.753 m)    GEN- The patient is well appearing, alert and oriented x 3 today.   HEENT: normocephalic, atraumatic; sclera clear, conjunctiva pink; hearing intact; oropharynx clear; neck supple, no JVP Lymph- no cervical lymphadenopathy Lungs- Clear to ausculation bilaterally, normal work of breathing.  No wheezes, rales, rhonchi Heart- Regular rate and rhythm, no murmurs, rubs or gallops, PMI not laterally displaced GI- soft, non-tender, non-distended, bowel sounds present, no hepatosplenomegaly Extremities- no clubbing, cyanosis, or edema; DP/PT/radial pulses 2+ bilaterally MS- no significant deformity or atrophy Skin- warm and dry, no rash or lesion Psych- euthymic mood, full affect Neuro- strength and sensation are intact  EKG is ordered. Personal review of EKG from today shows NSR at 90 bpm  Additional studies reviewed include: Previous EP office notes.   Assessment and Plan:  1. PVCs 2. PACs Very symptomatic despite overall low burden 1-4% on prior monitoring.  Medications have more often caused off target effects than relief.  Consider mexitil as next  option. At this time, pt wishes to remain off other medications.  He asks about loop recorder, but at this time I do not think it would change his treatment. He also does not currently have insurance. (Uses "[employer]")  3. Mobitz 1 and 2 Noted on prior monitoring.  Likely in setting of OSA on BiPap.   Follow up with Dr. Lovena Le in  4 months    Shirley Friar, PA-C  12/11/21 10:43 AM

## 2021-12-03 ENCOUNTER — Other Ambulatory Visit (HOSPITAL_COMMUNITY): Payer: Self-pay

## 2021-12-04 ENCOUNTER — Encounter: Payer: Self-pay | Admitting: Nurse Practitioner

## 2021-12-05 ENCOUNTER — Encounter: Payer: Self-pay | Admitting: Nurse Practitioner

## 2021-12-06 ENCOUNTER — Other Ambulatory Visit: Payer: Self-pay | Admitting: Nurse Practitioner

## 2021-12-07 ENCOUNTER — Encounter: Payer: Self-pay | Admitting: Nurse Practitioner

## 2021-12-07 ENCOUNTER — Other Ambulatory Visit: Payer: Self-pay | Admitting: Nurse Practitioner

## 2021-12-07 DIAGNOSIS — L03115 Cellulitis of right lower limb: Secondary | ICD-10-CM

## 2021-12-07 DIAGNOSIS — E118 Type 2 diabetes mellitus with unspecified complications: Secondary | ICD-10-CM

## 2021-12-07 MED ORDER — DOXYCYCLINE HYCLATE 100 MG PO TABS: 100.0000 mg | ORAL_TABLET | Freq: Two times a day (BID) | ORAL | 0 refills | Status: AC

## 2021-12-11 ENCOUNTER — Ambulatory Visit: Payer: Self-pay | Attending: Student | Admitting: Student

## 2021-12-11 ENCOUNTER — Encounter: Payer: Self-pay | Admitting: Student

## 2021-12-11 ENCOUNTER — Encounter: Payer: Self-pay | Admitting: Nurse Practitioner

## 2021-12-11 VITALS — BP 132/78 | HR 85 | Ht 69.0 in | Wt 335.0 lb

## 2021-12-11 DIAGNOSIS — I493 Ventricular premature depolarization: Secondary | ICD-10-CM

## 2021-12-11 NOTE — Patient Instructions (Signed)
Medication Instructions:  Your physician recommends that you continue on your current medications as directed. Please refer to the Current Medication list given to you today.  *If you need a refill on your cardiac medications before your next appointment, please call your pharmacy*   Lab Work: None If you have labs (blood work) drawn today and your tests are completely normal, you will receive your results only by: [place] (if you have MyChart) OR A paper copy in the mail If you have any lab test that is abnormal or we need to change your treatment, we will call you to review the results.   Follow-Up: At [facility], you and your health needs are our priority.  As part of our continuing mission to provide you with exceptional heart care, we have created designated Provider Care Teams.  These Care Teams include your primary Cardiologist (physician) and Advanced Practice Providers (APPs -  Physician Assistants and Nurse Practitioners) who all work together to provide you with the care you need, when you need it.   Your next appointment:   4 month(s)  The format for your next appointment:   In Person  Provider:   Dr. Myles Gip

## 2021-12-12 ENCOUNTER — Encounter: Payer: Self-pay | Admitting: Nurse Practitioner

## 2021-12-12 ENCOUNTER — Other Ambulatory Visit: Payer: Self-pay | Admitting: Nurse Practitioner

## 2021-12-12 DIAGNOSIS — L089 Local infection of the skin and subcutaneous tissue, unspecified: Secondary | ICD-10-CM

## 2021-12-13 ENCOUNTER — Ambulatory Visit (HOSPITAL_COMMUNITY)
Admission: RE | Admit: 2021-12-13 | Discharge: 2021-12-13 | Disposition: A | Discharge status: Home / Self Care | Payer: Self-pay | Source: Ambulatory Visit | Attending: Nurse Practitioner | Admitting: Nurse Practitioner

## 2021-12-13 ENCOUNTER — Encounter: Payer: Self-pay | Admitting: Nurse Practitioner

## 2021-12-13 ENCOUNTER — Other Ambulatory Visit (HOSPITAL_COMMUNITY): Payer: Self-pay

## 2021-12-13 ENCOUNTER — Other Ambulatory Visit: Payer: Self-pay

## 2021-12-13 DIAGNOSIS — L089 Local infection of the skin and subcutaneous tissue, unspecified: Secondary | ICD-10-CM | POA: Insufficient documentation

## 2021-12-13 LAB — HM DIABETES EYE EXAM

## 2021-12-17 ENCOUNTER — Ambulatory Visit: Payer: Self-pay | Attending: Nurse Practitioner | Admitting: Physical Therapy

## 2021-12-17 ENCOUNTER — Other Ambulatory Visit: Payer: Self-pay | Admitting: Nurse Practitioner

## 2021-12-17 VITALS — BP 121/84 | HR 80

## 2021-12-17 DIAGNOSIS — R42 Dizziness and giddiness: Secondary | ICD-10-CM

## 2021-12-17 DIAGNOSIS — R2681 Unsteadiness on feet: Secondary | ICD-10-CM

## 2021-12-17 NOTE — Therapy (Signed)
OUTPATIENT PHYSICAL THERAPY VESTIBULAR EVALUATION     Patient Name: Juan Kline MRN: [id number] DOB:1970/09/27, 51 y.o., male Today's Date: 12/18/2021  PCP: Gildardo Pounds, NP  REFERRING PROVIDER: Gildardo Pounds, NP    PT End of Session - 12/18/21 1254     Visit Number 1    Number of Visits 4    Date for PT Re-Evaluation 01/17/22    Authorization Type Self pay    PT Start Time 1018    PT Stop Time 1102    PT Time Calculation (min) 44 min    Activity Tolerance Patient tolerated treatment well    Behavior During Therapy [facility] for tasks assessed/performed             Past Medical History:  Diagnosis Date   Aortic atherosclerosis ([place])    Chronic foot ulcer ([place]) admitted 03/02/2014   medial border first metatarsal left foot  - resolved per patient 03/20/20   Diabetes ([place])    type 2 - no meds, diet controlled   Diabetic foot infection ([place])    First degree AV block    GAD (generalized anxiety disorder)    GERD (gastroesophageal reflux disease)    Hx of migraines    Hyperlipidemia    Hypertension Dx 2009   Mild depression    Osteomyelitis left first metatarsal 03/02/2014   Sleep apnea    not using CPAP- machine being looked at for repair 03/21/20    Venous stasis ulcer of left lower extremity ([place])    [name] 03/02/2014 - resolved per patient 03/20/20   Past Surgical History:  Procedure Laterality Date   BIOPSY  08/18/2019   Procedure: BIOPSY;  Surgeon: Thornton Park, MD;  [facility];  Service: Gastroenterology;;   ESOPHAGOGASTRODUODENOSCOPY (EGD) WITH PROPOFOL N/A 08/18/2019   Procedure: ESOPHAGOGASTRODUODENOSCOPY (EGD) WITH PROPOFOL;  Surgeon: Thornton Park, MD;  [facility];  Service: Gastroenterology;  Laterality: N/A;   Patient Active Problem List   Diagnosis Date Noted   Palpitations 03/21/2021   Chest pain 01/03/2021   Dermatitis 05/15/2020   Hx of migraine headaches 01/24/2020   Lipomatosis of subcutaneous tissue  12/08/2019   Tinnitus of both ears 11/08/2019   Abnormal MRI of the head 08/24/2019   Acute superficial gastritis without hemorrhage 08/24/2019   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/04/2019   Positive for macroalbuminuria 03/06/2019   Vapes nicotine containing substance 03/03/2019   Class 3 severe obesity due to excess calories without serious comorbidity with body mass index (BMI) of 45.0 to 49.9 in adult [facility]) 03/03/2019   Hyperlipidemia associated with type 2 diabetes mellitus ([place]) 03/03/2019   Type 2 diabetes mellitus with diabetic polyneuropathy, without long-term current use of insulin ([place]) 04/13/2018   Mild depression 04/01/2018   OSA (obstructive sleep apnea) 10/26/2017   Anxiety disorder 10/26/2017   Peripheral neuropathy 12/12/2016   GAD (generalized anxiety disorder) 05/11/2015   Former smoker 05/11/2015   Other hyperlipidemia 05/22/2014   Vitamin D insufficiency 05/22/2014   Chronic intractable headache 05/19/2014   Numbness and tingling of foot 05/19/2014   Venous stasis dermatitis of both lower extremities 05/19/2014   history of Hypertension 03/02/2014   Morbid obesity ([place]) 03/02/2014    ONSET DATE: May 2021  REFERRING DIAG: R42 (ICD-10-CM) - Lightheadedness   THERAPY DIAG:  Dizziness and giddiness  Unsteadiness on feet  Rationale for Evaluation and Treatment Rehabilitation  SUBJECTIVE:   SUBJECTIVE STATEMENT: Pt reports his symptoms have been going on for over 2 years - states he  saw Dr. Delice Lesch in May 2021 and no diagnosis has been found:  states he was diagnosed with PVC's recently;  has history of migraines and reports he tried taking Topamax for it but it did not help: states he does drive, depending on how he is feeling - and says he "doesn't go far" when he does drive;  pt brings list of symptoms that he says he wrote out last night so he wouldn't forget telling anything - states he felt really bad this morning when he got up but feels a little better now Pt  accompanied by: self  PERTINENT HISTORY: h/o migraines, tinnitus of both ears, peripheral neuropathy, DM type 2 with polyneuropathy   PAIN:  Are you having pain? No  PRECAUTIONS: None  WEIGHT BEARING RESTRICTIONS No  FALLS: Has patient fallen in last 6 months? No  LIVING ENVIRONMENT: Lives with: lives with their family Lives in: House/apartment  PLOF: Independent  PATIENT GOALS reduce the feelings of dizziness and light-headedness  OBJECTIVE:   DIAGNOSTIC FINDINGS: IMPRESSION: No acute finding. 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 Minimal small-vessel change of the hemispheric white matter, slightly progressive since 2019.    COGNITION: Overall cognitive status: Within functional limits for tasks assessed   POSTURE: rounded shoulders and forward head   Cervical ROM:  WNL's   GAIT: Gait pattern: WFL Distance walked: 100' Assistive device utilized: None Level of assistance: Modified independence Comments: steadiness varies depending on vertigo   PATIENT SURVEYS:  FOTO front office did not capture  Vitals:   12/17/21 1057  BP: 121/84  Pulse: 80      VESTIBULAR ASSESSMENT   GENERAL OBSERVATION: pt reports he feels best lying down, and flexes head to "ease it up": feels like he has a lump in his throat     SYMPTOM BEHAVIOR:   Subjective history: Pt reports he has no spinning vertigo; states problem started approx. 2-3 yrs ago - saw Dr. Delice Lesch in May 2021; pt reports he has neck weakness, tinnitus in bil. Ears, fullness in ears, some sensitivity to lights & sounds, feels worse with standing:  sitting still with positioning of head will make a difference in how he feels: feels worse with going out and/or riding; feels like ground is moving occasionally; doesn't feel well and does not have headaches:  has heart palpitations; has a lot of fatigue    Non-Vestibular symptoms: tinnitus, migraine symptoms, and neck  weakness - light and noise sensitivity   Type of dizziness: Imbalance (Disequilibrium), Lightheadedness/Faint, "Funny feeling in the head", "World moves", and "Swimmyheaded"   Frequency: states lately he feels it more - has felt it more in past month   Duration: minutes to hours   Aggravating factors: Induced by motion: occur when walking, driving, activity in general, and standing, Worse with fatigue, Worse outside or in busy environment, and Occurs when standing still    Relieving factors: lying supine   Progression of symptoms: unchanged   OCULOMOTOR EXAM:   Ocular Alignment: normal   Ocular ROM: No Limitations   Spontaneous Nystagmus: absent   Gaze-Induced Nystagmus: absent   Smooth Pursuits: intact   Saccades: intact       VESTIBULAR - OCULAR REFLEX:      Dynamic Visual Acuity: Static: line 11 Dynamic: line 9     MCTSIB:    Condition 1 - 30 secs;  Condition 2 - 30 secs mild sway;  Condition 3 - 30 secs -minimal sway;  Condition 4 -  mild sway      PATIENT EDUCATION: Education details: eval results - pt was informed that deficits do not appear to be related to vestibular dysfunction, but rather due to some etiology which has not been diagnosed or determined at this time; will do SOT to definitely rule out vestibular dysfunction Person educated: Patient Education method: Explanation Education comprehension: verbalized understanding   GOALS: Goals reviewed with patient? Yes    LONG TERM GOALS: Target date: 01/15/2022    Complete SOT and issue HEP as appropriate. Baseline:  Goal status: INITIAL  2.  Independent in HEP for vestibular/habituation exercises as appropriate. Baseline:  Goal status: INITIAL  3.  Demonstrate and verbalize techniques to assist with resolution of light-headedness.   Baseline:  Goal status: INITIAL   ASSESSMENT:  CLINICAL IMPRESSION: Patient is a 51 y.o. gentleman who was seen today for physical therapy evaluation and treatment for  light-headedness/dizziness of unknown etiology. Pt's oculomotor exam was WNL's with no abnormalities noted with smooth pursuit or saccade testing.  DVA was WNL's with a 2 line difference, indicative of VOR WNL's.  Pt was informed that his symptoms do not appear to be of vestibular system dysfunction, but rather appear to be secondary to other medical issues as pt has history of migraines and also cardiac issues with diagnosis of PVC's.  Will perform SOT to further rule out vestibular dysfunction.       OBJECTIVE IMPAIRMENTS decreased activity tolerance, decreased balance, difficulty walking, and dizziness.   ACTIVITY LIMITATIONS lifting, bending, standing, squatting, and locomotion level  PARTICIPATION LIMITATIONS: cleaning, driving, shopping, and yard work  PERSONAL FACTORS Fitness, Past/current experiences, and 1-2 comorbidities: h/o migraines and cardiac dysfunction (h/o PVC's)   are also affecting patient's functional outcome.   REHAB POTENTIAL: Good  CLINICAL DECISION MAKING: Evolving/moderate complexity  EVALUATION COMPLEXITY: Moderate   PLAN: PT FREQUENCY: 1x/week  PT DURATION: 4 weeks  PLANNED INTERVENTIONS: Therapeutic exercises, Therapeutic activity, Neuromuscular re-education, Balance training, Gait training, Patient/Family education, Self Care, and Vestibular training  PLAN FOR NEXT SESSION: do SOT; orthostatics assessment   Eevie Lapp, [place], PT 12/18/2021, 5:21 PM

## 2021-12-18 ENCOUNTER — Encounter: Payer: Self-pay | Admitting: Physical Therapy

## 2021-12-19 ENCOUNTER — Encounter: Payer: Self-pay | Admitting: Nurse Practitioner

## 2021-12-19 NOTE — Telephone Encounter (Signed)
Juan Kline can you assist with this? The [employer] doesn't cover podiatry any longer?

## 2021-12-19 NOTE — Telephone Encounter (Signed)
Juan Kline any new referrals or updated info can you call the patient and let him know? Thanks!! I will be out of the office until Tuesday and will not be able to touch base with him regarding his question and what we found out.

## 2021-12-29 ENCOUNTER — Other Ambulatory Visit (HOSPITAL_COMMUNITY): Payer: Self-pay

## 2021-12-30 ENCOUNTER — Other Ambulatory Visit (HOSPITAL_COMMUNITY): Payer: Self-pay

## 2021-12-30 ENCOUNTER — Other Ambulatory Visit: Payer: Self-pay

## 2021-12-31 ENCOUNTER — Ambulatory Visit: Payer: Self-pay | Admitting: Physical Therapy

## 2021-12-31 ENCOUNTER — Encounter: Payer: Self-pay | Admitting: Physical Therapy

## 2021-12-31 ENCOUNTER — Encounter: Payer: Self-pay | Admitting: Nurse Practitioner

## 2021-12-31 ENCOUNTER — Other Ambulatory Visit: Payer: Self-pay

## 2021-12-31 ENCOUNTER — Other Ambulatory Visit: Payer: Self-pay | Admitting: Nurse Practitioner

## 2021-12-31 VITALS — BP 118/77 | HR 82

## 2021-12-31 DIAGNOSIS — R2681 Unsteadiness on feet: Secondary | ICD-10-CM

## 2021-12-31 DIAGNOSIS — R42 Dizziness and giddiness: Secondary | ICD-10-CM

## 2021-12-31 NOTE — Therapy (Signed)
OUTPATIENT PHYSICAL THERAPY VESTIBULAR TREATMENT NOTE     Patient Name: Juan Kline MRN: [id number] DOB:Apr 04, 1971, 51 y.o., male Today's Date: 12/31/2021  PCP: Gildardo Pounds, NP  REFERRING PROVIDER: Gildardo Pounds, NP    PT End of Session - 12/31/21 1953     Visit Number 2    Number of Visits 4    Date for PT Re-Evaluation 01/17/22    Authorization Type Self pay    PT Start Time 1310    PT Stop Time 1400    PT Time Calculation (min) 50 min    Equipment Utilized During Treatment Other (comment)   harness vest used with SOT on balance master   Activity Tolerance Patient tolerated treatment well    Behavior During Therapy [facility] for tasks assessed/performed              Past Medical History:  Diagnosis Date   Aortic atherosclerosis ([place])    Chronic foot ulcer ([place]) admitted 03/02/2014   medial border first metatarsal left foot  - resolved per patient 03/20/20   Diabetes ([place])    type 2 - no meds, diet controlled   Diabetic foot infection ([place])    First degree AV block    GAD (generalized anxiety disorder)    GERD (gastroesophageal reflux disease)    Hx of migraines    Hyperlipidemia    Hypertension Dx 2009   Mild depression    Osteomyelitis left first metatarsal 03/02/2014   Sleep apnea    not using CPAP- machine being looked at for repair 03/21/20    Venous stasis ulcer of left lower extremity ([place])    [name] 03/02/2014 - resolved per patient 03/20/20   Past Surgical History:  Procedure Laterality Date   BIOPSY  08/18/2019   Procedure: BIOPSY;  Surgeon: Thornton Park, MD;  Location: [facility];  Service: Gastroenterology;;   ESOPHAGOGASTRODUODENOSCOPY (EGD) WITH PROPOFOL N/A 08/18/2019   Procedure: ESOPHAGOGASTRODUODENOSCOPY (EGD) WITH PROPOFOL;  Surgeon: Thornton Park, MD;  Location: [facility];  Service: Gastroenterology;  Laterality: N/A;   Patient Active Problem List   Diagnosis Date Noted   Palpitations 03/21/2021   Chest pain 01/03/2021    Dermatitis 05/15/2020   Hx of migraine headaches 01/24/2020   Lipomatosis of subcutaneous tissue 12/08/2019   Tinnitus of both ears 11/08/2019   Abnormal MRI of the head 08/24/2019   Acute superficial gastritis without hemorrhage 08/24/2019   COPD mixed type ([place]) 04/04/2019   Positive for macroalbuminuria 03/06/2019   Vapes nicotine containing substance 03/03/2019   Class 3 severe obesity due to excess calories without serious comorbidity with body mass index (BMI) of 45.0 to 49.9 in adult [facility]) 03/03/2019   Hyperlipidemia associated with type 2 diabetes mellitus ([place]) 03/03/2019   Type 2 diabetes mellitus with diabetic polyneuropathy, without long-term current use of insulin ([place]) 04/13/2018   Mild depression 04/01/2018   OSA (obstructive sleep apnea) 10/26/2017   Anxiety disorder 10/26/2017   Peripheral neuropathy 12/12/2016   GAD (generalized anxiety disorder) 05/11/2015   Former smoker 05/11/2015   Other hyperlipidemia 05/22/2014   Vitamin D insufficiency 05/22/2014   Chronic intractable headache 05/19/2014   Numbness and tingling of foot 05/19/2014   Venous stasis dermatitis of both lower extremities 05/19/2014   history of Hypertension 03/02/2014   Morbid obesity ([place]) 03/02/2014    ONSET DATE: May 2021  REFERRING DIAG: R42 (ICD-10-CM) - Lightheadedness   THERAPY DIAG:  Dizziness and giddiness  Unsteadiness on feet  Rationale for Evaluation and Treatment Rehabilitation  SUBJECTIVE:   SUBJECTIVE STATEMENT: Pt reports no major changes since initial eval 2 weeks ago; continues to have dizziness of varying intensity and duration, with "good days, bad days";  pt reports mild dizziness at current time, stating "I feel it some":  pt also reports intermittent episodes of neck musculature weakness - states his muscles feel a little weak at this time but not as bad as it has been  Pt accompanied by: self  PERTINENT HISTORY: h/o migraines, tinnitus of both ears, peripheral  neuropathy, DM type 2 with polyneuropathy   PAIN:  Are you having pain? No  PRECAUTIONS: None  WEIGHT BEARING RESTRICTIONS No  FALLS: Has patient fallen in last 6 months? No  LIVING ENVIRONMENT: Lives with: lives with their family Lives in: House/apartment  PLOF: Independent  PATIENT GOALS reduce the feelings of dizziness and light-headedness  OBJECTIVE:   Orthostatics Assessment:    BP in seated position - LUE - 118/77;  HR 82 bpm:  recorded at start of PT session      Vestibular Assessment - 12/31/21 1319       Orthostatics   BP supine (x 5 minutes) 119/76    HR supine (x 5 minutes) 78    BP sitting 103/67    HR sitting 86    BP standing (after 1 minute) 115/81    HR standing (after 1 minute) 89    BP standing (after 3 minutes) 112/82    HR standing (after 3 minutes) 88             Systolic reading decrease of 16 points:  diastolic reading decrease of 9 points with supine to sit transfer (borderline orthostatic hypotension but not truly orthostatic hypotension as diastolic reading did not drop by 10 points)  VESTIBULAR TREATMENT:      Sensory Organization Test: composite score 47/100:  N= 68/100   Somatosensory input WNL's  Visual input WNL's  Vestibular input  1-2/100 with N= 54/100 (minimal to none)  Condition 1 - all 3 trials WNL's Condition 2 - trial 1 WNL's:  trials 2 & 3 slightly decreased from N Condition 3 - all 3 trials slightly decreased from N Condition 4 - all 3 trials WNL's Condition 5- FALL on all 3 trials Condition 6 - FALL on all 3 trials   Standing Balance: Surface: Pillows Position: Feet Hip Width Apart Completed with: Eyes Open and Eyes Closed; Head Turns x 5 Reps and Head Nods x 5 Reps    Pt was given the above exercises (balance on foam) to pt for HEP  - 4 positions       Feet Apart, Head Motion - Eyes Open - ON PILLOWS    With eyes open, feet apart, move head slowly: up and down. Repeat __5__ times per session. Do  _1___ sessions per day.  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 Yes    LONG TERM GOALS: Target date: 01/28/2022    Complete SOT and issue HEP as appropriate. Baseline:  Goal status: INITIAL  2.  Independent in HEP for  vestibular/habituation exercises as appropriate. Baseline:  Goal status: INITIAL  3.  Demonstrate and verbalize techniques to assist with resolution of light-headedness.   Baseline:  Goal status: INITIAL   ASSESSMENT:  CLINICAL IMPRESSION: PT session focused on further testing to assess etiology of dizziness - testing included orthostatics assessment and posturography using SOT on balance master.  Orthostatics assessment did not definitively indicate orthostatic hypotension as pt's systolic reading dropped 16 points and diastolic reading dropped 9 points.  Sensory Organization Test did indicate very minimal vestibular input in maintaining balance with score 1-2/100 with N= 54/100, indicative of vestibular hypofunction which could possibly be related to pt's hx of migraines.  Somatosensory & visual inputs are WNL's.  Etiology of pt's c/o dizziness and intermittent cervical musculature weakness is unknown - pt would benefit from further work up to determine etiology and further assessment/management of migraines (recommend Dr. Sarina Ill if pt able to be seen at [facility]).  Pt was given HEP to address decreased vestibular input in maintaining balance.     OBJECTIVE IMPAIRMENTS decreased activity tolerance, decreased balance, difficulty walking, and  dizziness.   ACTIVITY LIMITATIONS lifting, bending, standing, squatting, and locomotion level  PARTICIPATION LIMITATIONS: cleaning, driving, shopping, and yard work  PERSONAL FACTORS Fitness, Past/current experiences, and 1-2 comorbidities: h/o migraines and cardiac dysfunction (h/o PVC's)   are also affecting patient's functional outcome.   REHAB POTENTIAL: Good  CLINICAL DECISION MAKING: Evolving/moderate complexity  EVALUATION COMPLEXITY: Moderate   PLAN: PT FREQUENCY: 1x/week  PT DURATION: 4 weeks  PLANNED INTERVENTIONS: Therapeutic exercises, Therapeutic activity, Neuromuscular re-education, Balance training, Gait training, Patient/Family education, Self Care, and Vestibular training  PLAN FOR NEXT SESSION: follow up with pt in approx. 3 weeks to see if neurology consult was scheduled and retest SOT to assess progress   Terryann Verbeek, [place], PT 12/31/2021, 7:57 PM

## 2021-12-31 NOTE — Patient Instructions (Signed)
Feet Apart, Head Motion - Eyes Open    With eyes open, feet apart, move head slowly: up and down. Repeat __5__ times per session. Do _1___ sessions per day.  Copyright  VHI. All rights reserved.

## 2022-01-01 ENCOUNTER — Other Ambulatory Visit (HOSPITAL_COMMUNITY): Payer: Self-pay

## 2022-01-02 ENCOUNTER — Ambulatory Visit (INDEPENDENT_AMBULATORY_CARE_PROVIDER_SITE_OTHER): Payer: No Typology Code available for payment source | Admitting: Podiatry

## 2022-01-02 DIAGNOSIS — M79675 Pain in left toe(s): Secondary | ICD-10-CM

## 2022-01-02 DIAGNOSIS — E1142 Type 2 diabetes mellitus with diabetic polyneuropathy: Secondary | ICD-10-CM

## 2022-01-02 DIAGNOSIS — B351 Tinea unguium: Secondary | ICD-10-CM

## 2022-01-02 DIAGNOSIS — M79674 Pain in right toe(s): Secondary | ICD-10-CM

## 2022-01-02 DIAGNOSIS — L97511 Non-pressure chronic ulcer of other part of right foot limited to breakdown of skin: Secondary | ICD-10-CM

## 2022-01-02 NOTE — Progress Notes (Unsigned)
  Subjective:  Patient ID: Juan Kline, male    DOB: [birth year],  MRN: [id number]  Chief Complaint  Patient presents with   Foot Problem    Diabetic foot care, neuropathy    51 y.o. male presents with the above complaint. History confirmed with patient. ***  Objective:  Physical Exam: warm, good capillary refill, nail exam {:315758}, no trophic changes or ulcerative lesions. {pod dm exam:23670::"DP pulses palpable","PT pulses palpable","protective sensation intact"} Left Foot: {exam; foot:5774::"normal exam, no swelling, tenderness, instability; ligaments intact, full range of motion of all ankle/foot joints"}  Right Foot: {exam; foot:5774::"normal exam, no swelling, tenderness, instability; ligaments intact, full range of motion of all ankle/foot joints"}        Assessment:   1. Right foot ulcer, limited to breakdown of skin ([place])   2. Type 2 diabetes mellitus with diabetic polyneuropathy, without long-term current use of insulin ([place])      Plan:  Patient was evaluated and treated and all questions answered.  Patient educated on diabetes. Discussed proper diabetic foot care and discussed risks and complications of disease. Educated patient in depth on reasons to return to the office immediately should he/she discover anything concerning or new on the feet. All questions answered. Discussed proper shoes as well.   Procedure: Excisional Debridement of Wound Rationale: Removal of non-viable soft tissue from the wound to promote healing.  Anesthesia: {local anesthesia:3041538} Pre-Debridement Wound Measurements: *** cm x *** cm x *** cm  Post-Debridement Wound Measurements: *** cm x *** cm x *** cm  Type of Debridement: Excisional Tissue Removed: Non-viable soft tissue Depth of Debridement: *** Instrumentation: 3-0 mm dermal curette / 15 blade scalpel  Technique: Sharp excisional debridement to bleeding, viable wound base.  Dressing: Dry, sterile, compression  dressing. Disposition: Patient tolerated procedure well. Patient to return in 1-2 week for follow-up.   Return in about 2 weeks (around 01/16/2022) for Right plantar 1st MPJ ulcer.         Everitt Amber, DPM [facility]

## 2022-01-07 ENCOUNTER — Other Ambulatory Visit: Payer: Self-pay | Admitting: Nurse Practitioner

## 2022-01-07 DIAGNOSIS — E1142 Type 2 diabetes mellitus with diabetic polyneuropathy: Secondary | ICD-10-CM

## 2022-01-08 ENCOUNTER — Other Ambulatory Visit (HOSPITAL_COMMUNITY): Payer: Self-pay

## 2022-01-08 MED ORDER — TRULICITY 0.75 MG/0.5ML ~~LOC~~ SOAJ
0.7500 mg | SUBCUTANEOUS | 1 refills | Status: DC
  Filled 2022-01-08 – 2022-01-10 (×3): qty 2, 28d supply, fill #0
  Filled 2022-02-10 (×2): qty 2, 28d supply, fill #1

## 2022-01-08 NOTE — Telephone Encounter (Signed)
Requested Prescriptions  Pending Prescriptions Disp Refills  . Dulaglutide (TRULICITY) 3.41 PF/7.9KW SOPN 2 mL 1    Sig: Inject 0.75 mg into the skin once a week.     Endocrinology:  Diabetes - GLP-1 Receptor Agonists Passed - 01/07/2022  8:21 PM      Passed - HBA1C is between 0 and 7.9 and within 180 days    HbA1c, POC (controlled diabetic range)  Date Value Ref Range Status  01/25/2020 6.5 0.0 - 7.0 % Final   Hgb A1c MFr Bld  Date Value Ref Range Status  09/04/2021 6.3 4.6 - 6.5 % Final    Comment:    Glycemic Control Guidelines for People with Diabetes:Non Diabetic:  <6%Goal of Therapy: <7%Additional Action Suggested:  >8%          Passed - Valid encounter within last 6 months    Recent Outpatient Visits          1 month ago Moultrie Forgan, [place] W, NP   1 year ago Migraine without aura and without status migrainosus, not intractable   [place], Deborah B, MD   1 year ago Type 2 diabetes mellitus with diabetic polyneuropathy, without long-term current use of insulin ([place])   [place], MD   1 year ago OSA (obstructive sleep apnea)   [place], Patrick E, MD   1 year ago Dermatitis   [place], MD      Future Appointments            In 1 month Gildardo Pounds, NP [place]

## 2022-01-10 ENCOUNTER — Other Ambulatory Visit: Payer: Self-pay

## 2022-01-10 ENCOUNTER — Other Ambulatory Visit (HOSPITAL_COMMUNITY): Payer: Self-pay

## 2022-01-16 ENCOUNTER — Ambulatory Visit (INDEPENDENT_AMBULATORY_CARE_PROVIDER_SITE_OTHER): Payer: No Typology Code available for payment source | Admitting: Podiatry

## 2022-01-16 DIAGNOSIS — E1142 Type 2 diabetes mellitus with diabetic polyneuropathy: Secondary | ICD-10-CM

## 2022-01-16 DIAGNOSIS — L84 Corns and callosities: Secondary | ICD-10-CM

## 2022-01-16 DIAGNOSIS — L97511 Non-pressure chronic ulcer of other part of right foot limited to breakdown of skin: Secondary | ICD-10-CM

## 2022-01-16 NOTE — Progress Notes (Signed)
Subjective:  Patient ID: Juan Kline, male    DOB: 1970/12/08,  MRN: [id number]  Chief Complaint  Patient presents with   Foot Ulcer    Rm 15 2 week follow up for right plantar foot ulcer. Pt states there has been a breakdown of skin that has fallen off. Pt also states that there is a new lesion at the middle of right foot.     51 y.o. male presents for follow up of right plantar foot ulcer sub 1st MPJ.  He was seen 2 weeks ago for the same issue.  The wound was debrided.  There was small superficial ulceration underlying.  He has been placing antibiotic ointment and a Band-Aid on the area.  Thinks it is healing in.  He is also concerned about a black discoloration on the bottom of his right heel.  Denies seeing any significant drainage from the area aside from possibly some bleeding at 1 point in time.  Objective:  Physical Exam: warm, good capillary refill, nail exam onychomycosis of the toenails,  Attention directed to the plantar aspect of the first MPJ right foot there is healed ulcer with healthy epithelial skin present with minimal hyperkeratotic tissue overlying this area. No opening drainage erythema or edema. .  See wound picture below        DP pulses palpable, PT pulses palpable, protective sensation absent, and vibratory sensation absent Left Foot: normal exam, no swelling, tenderness, instability; ligaments intact, full range of motion of all ankle/foot joints  Right Foot: normal exam, no swelling, tenderness, instability; ligaments intact, full range of motion of all ankle/foot joints and ulcer noted         Assessment:   1. Right foot ulcer, limited to breakdown of skin ([place])   2. Pre-ulcerative calluses   3. Type 2 diabetes mellitus with diabetic polyneuropathy, without long-term current use of insulin ([place])       Plan:  Patient was evaluated and treated and all questions answered.  Patient educated on diabetes. Discussed proper diabetic foot care and  discussed risks and complications of disease. Educated patient in depth on reasons to return to the office immediately should he/she discover anything concerning or new on the feet. All questions answered. Discussed proper shoes as well.   #Ulceration of the right plantar first MPJ limited to breakdown of skin, no sign of infection, now healed after prior debridement, wound care #pre ulcerative callus sub 1st MPJ -Wound healed, lightly debrided overlying hyperkeratotic tissue sub 1st MPJ right foot All symptomatic hyperkeratoses were safely debrided with a sterile #15 blade to patient's level of comfort without incident. We discussed preventative and palliative care of these lesions including supportive and accommodative shoegear, padding, prefabricated and custom molded accommodative orthoses, use of a pumice stone and lotions/creams daily.  -There are no signs of infection  -Recommend continued offloading to prevent further breakdown -Continue to monitor for worsening signs of redness drainage or swelling in the area. -Offload the area of the ulceration whenever possible, applied felt offloading pad to patients shoe insole for offloading -Recommend daily moisturizing lotion to the bottom of the right foot for dry skin  #Prior blood blister that has since drained with residual mild dried blood in a circular location on the plantar right heel.  No underlying ulceration -Debrided the dried blood with a 10 blade without issue no underlying wound.  Continue to monitor.   Return in about 6 weeks (around 02/27/2022) for follow up right foot callus.  Everitt Amber, DPM [facility] [place] [facility]

## 2022-01-24 ENCOUNTER — Encounter: Payer: Self-pay | Admitting: Nurse Practitioner

## 2022-01-29 ENCOUNTER — Telehealth: Payer: Self-pay

## 2022-01-29 ENCOUNTER — Other Ambulatory Visit: Payer: Self-pay

## 2022-01-29 ENCOUNTER — Other Ambulatory Visit (HOSPITAL_COMMUNITY): Payer: Self-pay

## 2022-01-29 NOTE — Telephone Encounter (Signed)
Message received from PCP noting that the patient will be losing his food stamps.   I tried to contact the patient [phone], message left with all back requested. Need to determine if he would benefit from referral to [facility] for assistance with food stamps and /or benefit from a referral to [employer].

## 2022-01-30 ENCOUNTER — Other Ambulatory Visit (HOSPITAL_COMMUNITY): Payer: Self-pay

## 2022-01-31 ENCOUNTER — Encounter: Payer: Self-pay | Admitting: Nurse Practitioner

## 2022-01-31 NOTE — Telephone Encounter (Signed)
Pt returned call, please call back.  CB@  [phone]

## 2022-02-03 ENCOUNTER — Telehealth: Payer: Self-pay | Admitting: Nurse Practitioner

## 2022-02-03 NOTE — Telephone Encounter (Signed)
Pt is calling to speak to [place]. Please advise [phone]

## 2022-02-03 NOTE — Telephone Encounter (Signed)
I called patient and he explained that he will be loosing his food stamps at the end of November. He stated that he sent information to his PCP about obtaining a letter confirming he is disabled and is not able to work to provide to [place] in order to keep his food stamps. He has applied for disability and has an "advocate and he stated he is not physically able to work. He has already been interviewed by [facility] and is waiting a decision about his disability application.  I offered to make a referral to [employer] of [place] [facility]) for possible assistance with appealing the denial of FNS benefit. He was in agreement to placing the referral. I explained that there is no guarantee that [facility] will be able to assist but it they are not able to help him they may be able to provide appropriate resources/ guidance.   I offered to place a referral to [employer] for assistance with obtaining food/ groceries but he said he is okay for now and we can hold off on that referral. He confirmed that he has a car to get to grocery store.   Referral sent to [facility]

## 2022-02-03 NOTE — Telephone Encounter (Signed)
Routing to Jane. 

## 2022-02-06 NOTE — Telephone Encounter (Signed)
Pt is calling to speak to [place]. CB- [phone]

## 2022-02-08 ENCOUNTER — Other Ambulatory Visit (HOSPITAL_COMMUNITY): Payer: Self-pay

## 2022-02-10 ENCOUNTER — Other Ambulatory Visit (HOSPITAL_COMMUNITY): Payer: Self-pay

## 2022-02-10 ENCOUNTER — Telehealth: Payer: Self-pay | Admitting: Emergency Medicine

## 2022-02-10 ENCOUNTER — Other Ambulatory Visit: Payer: Self-pay

## 2022-02-10 NOTE — Telephone Encounter (Signed)
Copied from [place] [id number]. Topic: General - Other >> Feb 10, 2022 11:32 AM [place] B wrote: Reason for CRM: Patient called in to speak with Betti Cruz about [place] benefits and a legal aid form.Please call back

## 2022-02-12 ENCOUNTER — Telehealth: Payer: Self-pay | Admitting: Emergency Medicine

## 2022-02-12 NOTE — Telephone Encounter (Signed)
I called the patient and he said he was contacted by [facility] and was informed that there is not much they can do for him at this point. He stated that the person he spoke with, Lorrine Kin, said she was sending him a document regarding the food stamps that his provider needs to complete.  I explained that the patient is usually able to submit the documents themselves. He asked that I call Ms Kenton Kingfisher [phone] for more information.  I told him that I would call her tomorrow and call him back after I speak with her.

## 2022-02-12 NOTE — Telephone Encounter (Signed)
Duplicate message. 

## 2022-02-12 NOTE — Telephone Encounter (Signed)
Copied from [place] [id number]. Topic: General - Other >> Feb 12, 2022  3:03 PM Cyndi Bender wrote: Reason for CRM: Pt requests to speak with Opal Sidles. Pt stated he called last Thursday and again this Monday but has yet to receive a return call. Pt requests that his call be returned asap.

## 2022-02-13 ENCOUNTER — Encounter: Payer: Self-pay | Admitting: Nurse Practitioner

## 2022-02-13 NOTE — Telephone Encounter (Signed)
I called the patient to let him know that I left a message for Lanie this morning but have not heard back from her

## 2022-02-13 NOTE — Telephone Encounter (Signed)
I tried to reach Juan Kline [phone]/LANC, message left with call back requested.

## 2022-02-14 ENCOUNTER — Telehealth: Payer: Self-pay | Admitting: Nurse Practitioner

## 2022-02-14 NOTE — Telephone Encounter (Signed)
Lanie w/ Legal Aid would like you to call her concerning this pt. This is her direct line. [phone]

## 2022-02-14 NOTE — Telephone Encounter (Signed)
I spoke to Lorrine Kin / [employer] [place]  regarding food stamp eligibility. She explained that during the pandemic the able body work requirement for individuals who are not receiving disability was waived. Now that we are post pandemic individuals who do not receive disability will be required to have their provider complete a form acknowledging that the individual is not able to work.    Lanie said that the patient has this form and she instructed him to discuss this with his provider at his upcoming appointment with PCP.  She stated he will continue to receive food stamps through November or December.   She plans to call the patient to inform him that she spoke to me and he needs to speak to his PCP about this at his appointment this month.

## 2022-02-17 ENCOUNTER — Other Ambulatory Visit: Payer: Self-pay

## 2022-02-27 ENCOUNTER — Ambulatory Visit: Payer: No Typology Code available for payment source | Admitting: Podiatry

## 2022-02-28 ENCOUNTER — Other Ambulatory Visit: Payer: Self-pay

## 2022-02-28 ENCOUNTER — Ambulatory Visit: Payer: Self-pay | Attending: Nurse Practitioner | Admitting: Nurse Practitioner

## 2022-02-28 ENCOUNTER — Encounter: Payer: Self-pay | Admitting: Nurse Practitioner

## 2022-02-28 VITALS — BP 132/81 | HR 90 | Ht 69.0 in | Wt 343.2 lb

## 2022-02-28 DIAGNOSIS — K219 Gastro-esophageal reflux disease without esophagitis: Secondary | ICD-10-CM

## 2022-02-28 DIAGNOSIS — E7849 Other hyperlipidemia: Secondary | ICD-10-CM

## 2022-02-28 DIAGNOSIS — Z23 Encounter for immunization: Secondary | ICD-10-CM

## 2022-02-28 DIAGNOSIS — E1142 Type 2 diabetes mellitus with diabetic polyneuropathy: Secondary | ICD-10-CM

## 2022-02-28 DIAGNOSIS — I10 Essential (primary) hypertension: Secondary | ICD-10-CM

## 2022-02-28 DIAGNOSIS — N481 Balanitis: Secondary | ICD-10-CM

## 2022-02-28 LAB — POCT GLYCOSYLATED HEMOGLOBIN (HGB A1C): HbA1c, POC (controlled diabetic range): 6.4 % (ref 0.0–7.0)

## 2022-02-28 MED ORDER — AMLODIPINE BESYLATE 10 MG PO TABS
10.0000 mg | ORAL_TABLET | Freq: Every day | ORAL | 1 refills | Status: DC
  Filled 2022-02-28 – 2022-03-10 (×2): qty 90, 90d supply, fill #0
  Filled 2022-06-08: qty 90, 90d supply, fill #1

## 2022-02-28 MED ORDER — FLUCONAZOLE 150 MG PO TABS
150.0000 mg | ORAL_TABLET | Freq: Every day | ORAL | 0 refills | Status: AC
  Filled 2022-02-28: qty 7, 7d supply, fill #0

## 2022-02-28 MED ORDER — OMEPRAZOLE 20 MG PO CPDR
20.0000 mg | DELAYED_RELEASE_CAPSULE | Freq: Two times a day (BID) | ORAL | 1 refills | Status: DC
  Filled 2022-02-28: qty 180, 90d supply, fill #0

## 2022-02-28 MED ORDER — TRULICITY 0.75 MG/0.5ML ~~LOC~~ SOAJ
0.7500 mg | SUBCUTANEOUS | 1 refills | Status: DC
  Filled 2022-02-28 – 2022-03-10 (×2): qty 2, 28d supply, fill #0
  Filled 2022-04-10: qty 2, 28d supply, fill #1

## 2022-02-28 MED ORDER — LOSARTAN POTASSIUM 100 MG PO TABS
100.0000 mg | ORAL_TABLET | Freq: Every day | ORAL | 1 refills | Status: DC
  Filled 2022-02-28 – 2022-03-28 (×2): qty 90, 90d supply, fill #0
  Filled 2022-06-24: qty 90, 90d supply, fill #1

## 2022-02-28 MED ORDER — CLOTRIMAZOLE 1 % EX CREA
1.0000 | TOPICAL_CREAM | Freq: Two times a day (BID) | CUTANEOUS | 1 refills | Status: DC
  Filled 2022-02-28: qty 30, 15d supply, fill #0

## 2022-02-28 NOTE — Progress Notes (Signed)
Assessment & Plan:  Juan Kline was seen today for diabetes and hypertension.  Diagnoses and all orders for this visit:  Type 2 diabetes mellitus with diabetic polyneuropathy, without long-term current use of insulin (HCC) -     POCT glycosylated hemoglobin (Hb A1C) -     Dulaglutide (TRULICITY) 8.82 CM/0.3KJ SOPN; Inject 0.75 mg into the skin once a week. -     CMP14+EGFR Continue blood sugar control as discussed in office today, low carbohydrate diet, and regular physical exercise as tolerated, 150 minutes per week (30 min each day, 5 days per week, or 50 min 3 days per week). Keep blood sugar logs with fasting goal of 90-130 mg/dl, post prandial (after you eat) less than 180.  For Hypoglycemia: BS <60 and Hyperglycemia BS >400; contact the clinic ASAP. Annual eye exams and foot exams are recommended.   Essential hypertension -     amLODipine (NORVASC) 10 MG tablet; Take 1 tablet by mouth daily. -     losartan (COZAAR) 100 MG tablet; Take 1 tablet by mouth daily. -     CMP14+EGFR Continue all antihypertensives as prescribed.  Reminded to bring in blood pressure log for follow  up appointment.  RECOMMENDATIONS: DASH/Mediterranean Diets are healthier choices for HTN.    Gastroesophageal reflux disease, unspecified whether esophagitis present -     omeprazole (PRILOSEC) 20 MG capsule; Take 1 capsule (20 mg total) by mouth 2 (two) times daily before a meal. INSTRUCTIONS: Avoid GERD Triggers: acidic, spicy or fried foods, caffeine, coffee, sodas,  alcohol and chocolate.    Balanitis -     clotrimazole (CLOTRIMAZOLE AF) 1 % cream; Apply 1 Application topically 2 (two) times daily. For 7 days -     fluconazole (DIFLUCAN) 150 MG tablet; Take 1 tablet (150 mg total) by mouth daily for 7 doses.  Other hyperlipidemia -     Lipid panel  Need for immunization against influenza -     Flu Vaccine QUAD 1moIM (Fluarix, Fluzone & Alfiuria Quad PF)    Patient has been counseled on age-appropriate  routine health concerns for screening and prevention. These are reviewed and up-to-date. Referrals have been placed accordingly. Immunizations are up-to-date or declined.    Subjective:   Chief Complaint  Patient presents with   Diabetes   Hypertension   HPI Juan Duval89y.o. male presents to office today for follow up to DM/HTN and with complaints of penile irritation. He has a history of balanitis.   He has a past medical history of Aortic atherosclerosis, Chronic foot ulcer (admitted 03/02/2014), DM2, Diabetic foot infection, First degree AV block, GAD, GERD, migraines, Hyperlipidemia, Hypertension (Dx 2009), Mild depression, Osteomyelitis left first metatarsal (03/02/2014), Sleep apnea, and Venous stasis ulcer of left lower extremity    He has a form with him today from social services to continue his food stamps. States he can not work as he can not stand for long periods of time without becoming dizzy or lightheaded. This also affects his balance. Cardiac workup negative. revious work up has been with neurology (vestibular migraines/MRI negative essentially) and cardiology (PVS/flecainide). Recommendations were to increase hydration and compliance with CPAP usage (history of nonadherence).    HTN Blood pressure is well controlled. He is currently prescribed amlodipine 10 mg daily and losartan 100 mg daily.  BP Readings from Last 3 Encounters:  02/28/22 132/81  12/31/21 118/77  12/17/21 121/84     DM 2 Diabetes is well controlled with Trulicity 01.79mg weekly. LDL  not quite at goal with tricor 145 mg daily and lipitor 60 mg daily. We may need to increase lipitor to 80 mg Lab Results  Component Value Date   HGBA1C 6.4 02/28/2022    Lab Results  Component Value Date   LDLCALC 89 03/28/2021    Balanitis He endorses penile irritation along with  tenderness. On exam today there is yeast noted when pulling the foresekin back and slight erythema of the tip of the penis. No purulent  discharge noted.  In the past he had been unresponsive to antifungal agents and was referred to Urology for this. He does note there is difficulty reaching his penis to clean due to body habitus.   Review of Systems  Constitutional:  Negative for fever, malaise/fatigue and weight loss.  HENT: Negative.  Negative for nosebleeds.   Eyes: Negative.  Negative for blurred vision, double vision and photophobia.  Respiratory: Negative.  Negative for cough and shortness of breath.   Cardiovascular: Negative.  Negative for chest pain, palpitations and leg swelling.  Gastrointestinal: Negative.  Negative for heartburn, nausea and vomiting.  Musculoskeletal: Negative.  Negative for myalgias.  Neurological: Negative.  Negative for dizziness, focal weakness, seizures and headaches.  Psychiatric/Behavioral: Negative.  Negative for suicidal ideas.     Past Medical History:  Diagnosis Date   Aortic atherosclerosis ([place])    Chronic foot ulcer ([place]) admitted 03/02/2014   medial border first metatarsal left foot  - resolved per patient 03/20/20   Diabetes ([place])    type 2 - no meds, diet controlled   Diabetic foot infection ([place])    First degree AV block    GAD (generalized anxiety disorder)    GERD (gastroesophageal reflux disease)    Hx of migraines    Hyperlipidemia    Hypertension Dx 2009   Mild depression    Osteomyelitis left first metatarsal 03/02/2014   Sleep apnea    not using CPAP- machine being looked at for repair 03/21/20    Venous stasis ulcer of left lower extremity ([place])    [name] 03/02/2014 - resolved per patient 03/20/20    Past Surgical History:  Procedure Laterality Date   BIOPSY  08/18/2019   Procedure: BIOPSY;  Surgeon: Thornton Park, MD;  Location: [facility];  Service: Gastroenterology;;   ESOPHAGOGASTRODUODENOSCOPY (EGD) WITH PROPOFOL N/A 08/18/2019   Procedure: ESOPHAGOGASTRODUODENOSCOPY (EGD) WITH PROPOFOL;  Surgeon: Thornton Park, MD;  Location: [facility];   Service: Gastroenterology;  Laterality: N/A;    Family History  Problem Relation Age of Onset   Hypertension Mother    Hyperlipidemia Mother    Diabetes Mother    Kidney disease Mother    Hypertension Father    Hyperlipidemia Father    Diabetes Brother    Hypertension Brother    Hyperlipidemia Brother    Heart disease Maternal Grandmother    COPD Maternal Grandmother    Diabetes Maternal Grandfather    Heart attack Paternal Grandmother    Heart disease Paternal Grandfather    Colon cancer Neg Hx    Colon polyps Neg Hx    Stomach cancer Neg Hx    Esophageal cancer Neg Hx    Ulcerative colitis Neg Hx     Social History Reviewed with no changes to be made today.   Outpatient Medications Prior to Visit  Medication Sig Dispense Refill   aspirin 81 MG EC tablet Take 1 tablet (81 mg total) by mouth daily. Swallow whole. 30 tablet 11   atorvastatin (LIPITOR) 40 MG tablet  Take 1 and 1/2 tablets by mouth daily. 45 tablet 6   fenofibrate (TRICOR) 145 MG tablet Take 1 tablet (145 mg total) by mouth daily. 90 tablet 3   glucose blood test strip Check blood sugar once daily fasting or 2 hours after a meal 100 each 12   Magnesium 400 MG CAPS Take 400 mg by mouth daily.     sertraline (ZOLOFT) 100 MG tablet TAKE 1.5 TABLETS (150 MG TOTAL) BY MOUTH DAILY. 45 tablet 11   amLODipine (NORVASC) 10 MG tablet Take 1 tablet by mouth daily. 90 tablet 1   Dulaglutide (TRULICITY) 2.77 AJ/2.8NO SOPN Inject 0.75 mg into the skin once a week. 2 mL 1   losartan (COZAAR) 100 MG tablet Take 1 tablet by mouth daily. 90 tablet 1   omeprazole (PRILOSEC) 20 MG capsule Take 1 capsule by mouth daily as needed (indigestion). 90 capsule 1   No facility-administered medications prior to visit.    Allergies  Allergen Reactions   Lyrica [Pregabalin] Anxiety       Objective:    BP 132/81   Pulse 90   Ht _0  (1.753 m)   Wt (!) 343 lb 3.2 oz (155.7 kg)   SpO2 98%   BMI 50.68 kg/m  Wt Readings from Last  3 Encounters:  02/28/22 (!) 343 lb 3.2 oz (155.7 kg)  12/11/21 (!) 335 lb (152 kg)  11/20/21 (!) 334 lb (151.5 kg)    Physical Exam Vitals and nursing note reviewed.  Constitutional:      Appearance: He is well-developed.  HENT:     Head: Normocephalic and atraumatic.  Cardiovascular:     Rate and Rhythm: Normal rate and regular rhythm.     Heart sounds: Normal heart sounds. No murmur heard.    No friction rub. No gallop.  Pulmonary:     Effort: Pulmonary effort is normal. No tachypnea or respiratory distress.     Breath sounds: Normal breath sounds. No decreased breath sounds, wheezing, rhonchi or rales.  Chest:     Chest wall: No tenderness.  Abdominal:     General: Bowel sounds are normal.     Palpations: Abdomen is soft.  Genitourinary:    Penis: Uncircumcised. Erythema and discharge present. No phimosis, paraphimosis, hypospadias, tenderness, swelling or lesions.      Comments: SEE HPI NOTE Musculoskeletal:        General: Normal range of motion.     Cervical back: Normal range of motion.  Skin:    General: Skin is warm and dry.  Neurological:     Mental Status: He is alert and oriented to person, place, and time.     Coordination: Coordination normal.  Psychiatric:        Behavior: Behavior normal. Behavior is cooperative.        Thought Content: Thought content normal.        Judgment: Judgment normal.          Patient has been counseled extensively about nutrition and exercise as well as the importance of adherence with medications and regular follow-up. The patient was given clear instructions to go to ER or return to medical center if symptoms don't improve, worsen or new problems develop. The patient verbalized understanding.   Follow-up: Return in about 3 months (around 05/31/2022) for HTN.   Gildardo Pounds, FNP-BC [facility] and [place], [place]   02/28/2022, 1:31 PM

## 2022-03-01 LAB — CMP14+EGFR
ALT: 31 IU/L (ref 0–44)
AST: 21 IU/L (ref 0–40)
Albumin/Globulin Ratio: 1.5 (ref 1.2–2.2)
Albumin: 4.4 g/dL (ref 4.1–5.1)
Alkaline Phosphatase: 38 IU/L — ABNORMAL LOW (ref 44–121)
BUN/Creatinine Ratio: 29 — ABNORMAL HIGH (ref 9–20)
BUN: 26 mg/dL — ABNORMAL HIGH (ref 6–24)
Bilirubin Total: 0.2 mg/dL (ref 0.0–1.2)
CO2: 21 mmol/L (ref 20–29)
Calcium: 9.6 mg/dL (ref 8.7–10.2)
Chloride: 103 mmol/L (ref 96–106)
Creatinine, Ser: 0.9 mg/dL (ref 0.76–1.27)
Globulin, Total: 2.9 g/dL (ref 1.5–4.5)
Glucose: 127 mg/dL — ABNORMAL HIGH (ref 70–99)
Potassium: 4.5 mmol/L (ref 3.5–5.2)
Sodium: 140 mmol/L (ref 134–144)
Total Protein: 7.3 g/dL (ref 6.0–8.5)
eGFR: 104 mL/min/{1.73_m2} (ref >59)

## 2022-03-01 LAB — LIPID PANEL
Chol/HDL Ratio: 3.7 ratio (ref 0.0–5.0)
Cholesterol, Total: 182 mg/dL (ref 100–199)
HDL: 49 mg/dL (ref >39)
LDL Chol Calc (NIH): 108 mg/dL — ABNORMAL HIGH (ref 0–99)
Triglycerides: 142 mg/dL (ref 0–149)
VLDL Cholesterol Cal: 25 mg/dL (ref 5–40)

## 2022-03-03 ENCOUNTER — Other Ambulatory Visit: Payer: Self-pay

## 2022-03-03 ENCOUNTER — Ambulatory Visit (INDEPENDENT_AMBULATORY_CARE_PROVIDER_SITE_OTHER): Payer: Self-pay | Admitting: Neurology

## 2022-03-03 ENCOUNTER — Telehealth: Payer: Self-pay | Admitting: Neurology

## 2022-03-03 ENCOUNTER — Encounter: Payer: Self-pay | Admitting: Neurology

## 2022-03-03 VITALS — BP 132/82 | HR 74 | Ht 69.0 in | Wt 345.0 lb

## 2022-03-03 DIAGNOSIS — I619 Nontraumatic intracerebral hemorrhage, unspecified: Secondary | ICD-10-CM

## 2022-03-03 DIAGNOSIS — G43109 Migraine with aura, not intractable, without status migrainosus: Secondary | ICD-10-CM

## 2022-03-03 DIAGNOSIS — R55 Syncope and collapse: Secondary | ICD-10-CM

## 2022-03-03 DIAGNOSIS — R42 Dizziness and giddiness: Secondary | ICD-10-CM

## 2022-03-03 DIAGNOSIS — R9089 Other abnormal findings on diagnostic imaging of central nervous system: Secondary | ICD-10-CM

## 2022-03-03 MED ORDER — FREMANEZUMAB-VFRM 225 MG/1.5ML ~~LOC~~ SOSY: 225.0000 mg | PREFILLED_SYRINGE | Freq: Once | SUBCUTANEOUS | Status: DC

## 2022-03-03 MED ORDER — AJOVY 225 MG/1.5ML ~~LOC~~ SOAJ: 225.0000 mg | SUBCUTANEOUS | 0 refills | Status: DC

## 2022-03-03 NOTE — Progress Notes (Addendum)
[facility]    Provider:  Dr Jaynee Eagles Requesting Provider: Gildardo Pounds, NP Primary Care Provider:  Gildardo Pounds, NP  CC:  lightheadedness when standing or changing psotions most of the time, better laying down. Says he is compliant with cpap.   HPI:  Juan Kline is a 51 y.o. male here as requested by Gildardo Pounds, NP for lightheadedness. Very complicated patient who has seen multiple especialists for years for same issues. PMHx HTN, OSA, COPD, DM2, HLD, former smoker, morbid obesity, intractale headahes, anxiety, despression, currently vapes, migraines, chronic foot ulcer, aortic atherosclerosis, 1st degree heart block, gad, osteonyelitis left metatarsal, nenous statis uler, U reviewed Dr. Gust Brooms notes, can;t stand too long due to dizziness or lightheadedness, cardiac workup negative, has been neurology (MRI unremarkable, non compliance with cpap. Been to multiple neurologists ([facility], [employer]). 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  Patient is here alone, he complains more of dizziness or lightheadedness as opposed to have migraines, he has occasional surface of the skin pain sensitivity, sensitivity to certain lights and sound, ringing in the ears, fullness in the ears, neck gets extremely weak, dull headaches of any pain at all but it is pressure, feels some better if lying down, opposed to standing or sitting upright and if upright have to tilt head to feel little better, also has balance issues, sometimes feels like he could pass out at times and really bad, feels like before Mickel Baas is moving sometimes.Symptoms started since 2021. No inciting events. More feelings of dizziness and lightheadedness, no vertigo. More so with changing positions or when standing for a long time. More like he Is gooing to faint and  lightheaded. Bothers him more with neck extension, Presyncope.  The patient gets the symptoms, he feels like he has tunnel vision, feels like he is going to pass out, he has to lean against something often, usually when he is standing never when he is sitting, sitting down helps, changing positions makes it worse, much better laying down, worse with standing, sometimes he can feel it when sitting down but not often. Headaches are not often, very mild, not really migraineous.felt no relief with LP fluid removal.   Fron a thorough review of records, medication stried that can be used in migraine management include:tylenol, amlodipine,asa, carvedilol, flexeril,voltarenbenadryl, propranolol contraindicated due to copd, lexapro, gabapentin, losaartan, magnesium, meclizine, naproxen, zofrnm ,compazine,lyrica,zoloft,topamax, plyrica  Reviewed notes, labs and imaging from outside physicians, which showed :  [facility] never followed up. He c.o headache and dizziness, head pressure, photophobia, phonophobia, nausea, pressure daily, takes tylenol every day(has been counseled on medication overuse). More lightheaded than dizzy, off balance, anxiety may be a factor, frquent ER visits, poor diet of fruit juices and sodea, noncompliance to cpap, he was orthostatic that day, neuro exam was normal.  their plan:  1. Increase hydration to at least 60-80 ounces of water per day 2. Decrease use of Tylenol in half on a weekly basis until discontinued. Should not be using more than 2-3 days per week 3. Please keep a a headache diary  4. Please consider balance therapy, vestibular therapy  5. Increase use of CPAP, consider mask or nasal pillow or device  6. Start Topamax 91m (1 tab) nightly x 1 week. Then increase to 561m(2 tabs) nightly x 1 week. Then increase  to 10m (3 tabs) nightly x 1 week. Then increase to 1044m(1 tabs) nightly x week.     Fron a thorough review of records, medication stried  that can be used in migraine management include:tylenol, amlodipine,asa, carvedilol, flexeril,voltarenbenadryl, propranolol contraindicated due to copd, lexapro, gabapentin, losaartan, magnesium, meclizine, naproxen, zofrnm ,compazine,lyrica,zoloft,topamax, plyrica  MRI 6/9/202: IMPRESSION: No acute finding. 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 Minimal small-vessel change of the hemispheric white matter, slightly progressive since 2019.   No change in a focus of hemosiderin deposition in the left occipital lobe.   No CP angle or temporal bone lesion identified.   Recent Results (from the past 2160 hour(s))  HM DIABETES EYE EXAM     Status: None   Collection Time: 12/13/21 12:00 AM  Result Value Ref Range   HM Diabetic Eye Exam No Retinopathy No Retinopathy  POCT glycosylated hemoglobin (Hb A1C)     Status: None   Collection Time: 02/28/22 11:27 AM  Result Value Ref Range   Hemoglobin A1C     HbA1c POC (<> result, manual entry)     HbA1c, POC (prediabetic range)     HbA1c, POC (controlled diabetic range) 6.4 0.0 - 7.0 %  CMP14+EGFR     Status: Abnormal   Collection Time: 02/28/22 11:46 AM  Result Value Ref Range   Glucose 127 (H) 70 - 99 mg/dL   BUN 26 (H) 6 - 24 mg/dL   Creatinine, Ser 0.90 0.76 - 1.27 mg/dL   eGFR 104 >59 mL/min/1.73   BUN/Creatinine Ratio 29 (H) 9 - 20   Sodium 140 134 - 144 mmol/L   Potassium 4.5 3.5 - 5.2 mmol/L   Chloride 103 96 - 106 mmol/L   CO2 21 20 - 29 mmol/L   Calcium 9.6 8.7 - 10.2 mg/dL   Total Protein 7.3 6.0 - 8.5 g/dL   Albumin 4.4 4.1 - 5.1 g/dL   Globulin, Total 2.9 1.5 - 4.5 g/dL   Albumin/Globulin Ratio 1.5 1.2 - 2.2   Bilirubin Total 0.2 0.0 - 1.2 mg/dL   Alkaline Phosphatase 38 (L) 44 - 121 IU/L   AST 21 0 - 40 IU/L   ALT 31 0 - 44 IU/L  Lipid panel     Status: Abnormal   Collection Time: 02/28/22 11:46 AM  Result Value Ref Range   Cholesterol, Total 182 100 - 199 mg/dL    Triglycerides 142 0 - 149 mg/dL   HDL 49 >39 mg/dL   VLDL Cholesterol Cal 25 5 - 40 mg/dL   LDL Chol Calc (NIH) 108 (H) 0 - 99 mg/dL   Chol/HDL Ratio 3.7 0.0 - 5.0 ratio    Comment:                                   T. Chol/HDL Ratio                                             Men  Women                               1/2 Avg.Risk  3.4    3.3  Avg.Risk  5.0    4.4                                2X Avg.Risk  9.6    7.1                                3X Avg.Risk 23.4   11.0      Review of Systems: Patient complains of symptoms per HPI as well as the following symptoms lightheaded. Pertinent negatives and positives per HPI. All others negative.   Social History   Socioeconomic History   Marital status: Single    Spouse name: Not on file   Number of children: 0   Years of education: Not on file   Highest education level: Not on file  Occupational History   Not on file  Tobacco Use   Smoking status: Former    Packs/day: 1.50    Years: 26.00    Total pack years: 39.00    Types: E-cigarettes, Cigarettes    Start date: 1989    Quit date: 02/16/2014    Years since quitting: 8.0   Smokeless tobacco: Never   Tobacco comments:    occasional vapes - no nicotine  Vaping Use   Vaping Use: Former   Start date: 04/15/2015   Quit date: 04/11/2019   Substances: Nicotine  Substance and Sexual Activity   Alcohol use: Not Currently    Comment: None 03/28/21, 03/02/2014 "I rarely drink"   Drug use: No   Sexual activity: Not Currently  Other Topics Concern   Not on file  Social History Narrative   Pt lives in 1 story home with his mother and step father   9th grade education   Last occupation was with [employer] in 2009      Unemployed at this time- thinking of filing for disability      Right handed      Social Determinants of Health   Financial Resource Strain: Not on file  Food Insecurity: Not on file  Transportation Needs: Not on file   Physical Activity: Not on file  Stress: Not on file  Social Connections: Not on file  Intimate Partner Violence: Not on file    Family History  Problem Relation Age of Onset   Hypertension Mother    Hyperlipidemia Mother    Diabetes Mother    Kidney disease Mother    Hypertension Father    Hyperlipidemia Father    Diabetes Brother    Hypertension Brother    Hyperlipidemia Brother    Heart disease Maternal Grandmother    COPD Maternal Grandmother    Diabetes Maternal Grandfather    Heart attack Paternal Grandmother    Heart disease Paternal Grandfather    Colon cancer Neg Hx    Colon polyps Neg Hx    Stomach cancer Neg Hx    Esophageal cancer Neg Hx    Ulcerative colitis Neg Hx     Past Medical History:  Diagnosis Date   Aortic atherosclerosis ([place])    Chronic foot ulcer ([place]) admitted 03/02/2014   medial border first metatarsal left foot  - resolved per patient 03/20/20   Diabetes ([place])    type 2 - no meds, diet controlled   Diabetic foot infection ([place])    First degree AV block    GAD (generalized anxiety disorder)  GERD (gastroesophageal reflux disease)    Hx of migraines    Hyperlipidemia    Hypertension Dx 2009   Mild depression    Osteomyelitis left first metatarsal 03/02/2014   Sleep apnea    not using CPAP- machine being looked at for repair 03/21/20    Venous stasis ulcer of left lower extremity ([place])    [name] 03/02/2014 - resolved per patient 03/20/20    Patient Active Problem List   Diagnosis Date Noted   Palpitations 03/21/2021   Chest pain 01/03/2021   Dermatitis 05/15/2020   Hx of migraine headaches 01/24/2020   Lipomatosis of subcutaneous tissue 12/08/2019   Tinnitus of both ears 11/08/2019   Abnormal MRI of the head 08/24/2019   Acute superficial gastritis without hemorrhage 08/24/2019   COPD mixed type ([place]) 04/04/2019   Positive for macroalbuminuria 03/06/2019   Vapes nicotine containing substance 03/03/2019   Class 3 severe obesity  due to excess calories without serious comorbidity with body mass index (BMI) of 45.0 to 49.9 in adult [facility]) 03/03/2019   Hyperlipidemia associated with type 2 diabetes mellitus ([place]) 03/03/2019   Type 2 diabetes mellitus with diabetic polyneuropathy, without long-term current use of insulin ([place]) 04/13/2018   Mild depression 04/01/2018   OSA (obstructive sleep apnea) 10/26/2017   Anxiety disorder 10/26/2017   Peripheral neuropathy 12/12/2016   GAD (generalized anxiety disorder) 05/11/2015   Former smoker 05/11/2015   Other hyperlipidemia 05/22/2014   Vitamin D insufficiency 05/22/2014   Chronic intractable headache 05/19/2014   Numbness and tingling of foot 05/19/2014   Venous stasis dermatitis of both lower extremities 05/19/2014   history of Hypertension 03/02/2014   Morbid obesity ([place]) 03/02/2014    Past Surgical History:  Procedure Laterality Date   BIOPSY  08/18/2019   Procedure: BIOPSY;  Surgeon: Thornton Park, MD;  Location: [facility];  Service: Gastroenterology;;   ESOPHAGOGASTRODUODENOSCOPY (EGD) WITH PROPOFOL N/A 08/18/2019   Procedure: ESOPHAGOGASTRODUODENOSCOPY (EGD) WITH PROPOFOL;  Surgeon: Thornton Park, MD;  Location: [facility];  Service: Gastroenterology;  Laterality: N/A;    Current Outpatient Medications  Medication Sig Dispense Refill   amLODipine (NORVASC) 10 MG tablet Take 1 tablet by mouth daily. 90 tablet 1   aspirin 81 MG EC tablet Take 1 tablet (81 mg total) by mouth daily. Swallow whole. 30 tablet 11   atorvastatin (LIPITOR) 40 MG tablet Take 1 and 1/2 tablets by mouth daily. 45 tablet 6   clotrimazole (CLOTRIMAZOLE AF) 1 % cream Apply 1 Application topically 2 (two) times daily. For 7 days 30 g 1   Dulaglutide (TRULICITY) 3.61 WE/3.1VQ SOPN Inject 0.75 mg into the skin once a week. 2 mL 1   fenofibrate (TRICOR) 145 MG tablet Take 1 tablet (145 mg total) by mouth daily. 90 tablet 3   fluconazole (DIFLUCAN) 150 MG tablet Take 1 tablet (150 mg  total) by mouth daily for 7 doses. 7 tablet 0   Fremanezumab-vfrm (AJOVY) 225 MG/1.5ML SOAJ Inject 225 mg into the skin every 30 (thirty) days. 4.5 mL 0   glucose blood test strip Check blood sugar once daily fasting or 2 hours after a meal 100 each 12   losartan (COZAAR) 100 MG tablet Take 1 tablet by mouth daily. 90 tablet 1   Magnesium 400 MG CAPS Take 400 mg by mouth daily.     omeprazole (PRILOSEC) 20 MG capsule Take 1 capsule (20 mg total) by mouth 2 (two) times daily before a meal. 180 capsule 1   sertraline (ZOLOFT) 100 MG  tablet TAKE 1.5 TABLETS (150 MG TOTAL) BY MOUTH DAILY. 45 tablet 11   Current Facility-Administered Medications  Medication Dose Route Frequency Provider Last Rate Last Admin   Fremanezumab-vfrm SOSY 225 mg  225 mg Subcutaneous Once Melvenia Beam, MD        Allergies as of 03/03/2022 - Review Complete 03/03/2022  Allergen Reaction Noted   Lyrica [pregabalin] Anxiety 08/12/2016    Vitals: BP 132/82   Pulse 74   Ht _0  (1.753 m)   Wt (!) 345 lb (156.5 kg)   BMI 50.95 kg/m  Last Weight:  Wt Readings from Last 1 Encounters:  03/03/22 (!) 345 lb (156.5 kg)   Last Height:   Ht Readings from Last 1 Encounters:  03/03/22 _1  (1.753 m)     Physical exam: Exam: Gen: NAD, conversant, well nourised, mobidley obese, well groomed                     CV: tachy but regular , no MRG. No Carotid Bruits. No peripheral edema, warm, nontender Eyes: Conjunctivae clear without exudates or hemorrhage  Neuro: Detailed Neurologic Exam  Speech:    Speech is normal; fluent and spontaneous with normal comprehension.  Cognition:    The patient is oriented to person, place, and time;     recent and remote memory intact;     language fluent;     normal attention, concentration,     fund of knowledge Cranial Nerves:    The pupils are equal, round, and reactive to light. Pupils too small to visualize fundi Visual fields are full to finger confrontation.  Extraocular movements are intact. Trigeminal sensation is intact and the muscles of mastication are normal. The face is symmetric. The palate elevates in the midline. Hearing intact. Voice is normal. Shoulder shrug is normal. The tongue has normal motion without fasciculations.   Coordination:    [place]  Gait:   normal. Wide based due to large body habitus.  Motor Observation:    No asymmetry, no atrophy, and no involuntary movements noted. Tone:    Normal muscle tone.    Posture:    Posture is normal. normal erect    Strength:    Strength is V/V in the upper and lower limbs.      Sensation: intact to LT     Reflex Exam:  DTR's:    Deep tendon reflexes in the upper and lower extremities are symmetrical bilaterally.   Toes:    The toes are equal bilaterally.   Clonus:    Clonus is absent.    Assessment/Plan: 51 y.o. male here as requested by Gildardo Pounds, NP for lightheadedness. Very complicated patient who has seen multiple especialists for years for same issues. PMHx HTN, OSA, COPD, DM2, HLD, former smoker, morbid obesity, intractale headahes, anxiety, despression, currently vapes, migraines, chronic foot ulcer, aortic atherosclerosis, 1st degree heart block, gad, osteonyelitis left metatarsal, nenous statis uler, U reviewed Dr. Gust Brooms notes, can;t stand too long due to dizziness or lightheadedness, cardiac workup negative, has been neurology (MRI unremarkable, non compliance with cpap. Been to multiple neurologists ([facility], [place]). 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  The patient gets the symptoms, he feels like he has tunnel vision, feels like he is going to pass out, he has to lean against something often, usually when he  is standing never when he is sitting, sitting down helps, changing positions makes it worse, much  better laying down, worse with standing, sometimes he can feel it when sitting down but not often. Headaches are not often, very mild, not really migraineous.complete workup by cardiology.   lightheadedness when standing or changing psotions most of the time, better laying down. Says he is compliant with cpap. : Sounds like orthostatic hypotension but can't seem to prove it, unfortunately not symptomatic today. Pulse did increase significantly when walking but out of shape andobese and again not symptomatic today. May consider repeat MRI if in 3 months no improvement.   Orders Placed This Encounter  Procedures   CT ANGIO HEAD W OR WO CONTRAST   CT ANGIO NECK W OR WO CONTRAST    Siting for me 121/76 p67  Walking 5-7 minutes 128/78 p 86 slightly feeling symptomatic not really maybe some increased pressure  134/4 walking another 10 minutes Adding the pulse ox: started at 82 pulse increased to 110 and pulse ox down to 95% Patiented winded but not symptomatic or lightheaded today On resting pulse decreased 77 and pulse ox 97 Finally symptomatic ox 96 p 101 134/72 pulse 94 then took manual while symptomatic: Manual 121/82 left arm, Manual  121/74  right arm taken manual then had to sit down after taking manuel blood presures and felt better with hydation and sitting for 10 minutes  but felt drainesd, sitting helped, blood pressure when felt better was 134/92   OT for stockings, may be pooling of blood iv legs due to venous insufficiency Also give him instructions on non pharmaceutical ways to combat orthostasis   Despite not orthostatic and BPs WNL recommend a trial of decrease BP meds just to see will email Dr. Raul Del  Unknown etiology, doesn't really fit vestibular migraines but can't rule it out, try monthly Ajovy injects. 3 samples today just to see but really doesn't fir migraine with dizziness however will try and treat. RTC 3 months  Meds ordered this encounter  Medications    Fremanezumab-vfrm (AJOVY) 225 MG/1.5ML SOAJ    Sig: Inject 225 mg into the skin every 30 (thirty) days.    Dispense:  4.5 mL    Refill:  0   Fremanezumab-vfrm SOSY 225 mg    Cc: Gildardo Pounds, NP,  Gildardo Pounds, NP  Sarina Ill, MD  [facility] [address] [place],  [address]  Phone [phone] Fax [phone]   I spent over 90 minutes of face-to-face and non-face-to-face time with patient on the  1. Postural dizziness with presyncope   2. Lightheadedness   3. Cerebral hemorrhage ([place])   4. Abnormal finding on MRI of brain   5. Migraine aura without headache    diagnosis.  This included previsit chart review, lab review, study review, order entry, electronic health record documentation, patient education on the different diagnostic and therapeutic options, counseling and coordination of care, risks and benefits of management, compliance, or risk factor reduction

## 2022-03-03 NOTE — Telephone Encounter (Signed)
sent to [facility] for scheduling [phone]

## 2022-03-03 NOTE — Patient Instructions (Addendum)
Ajovy once monthly  CT of blood vessels of head and neck  Fremanezumab Injection What is this medication? FREMANEZUMAB (fre ma NEZ ue mab) prevents migraines. It works by blocking a substance in the body that causes migraines. It is a monoclonal antibody. This medicine may be used for other purposes; ask your health care provider or pharmacist if you have questions. COMMON BRAND NAME(S): AJOVY What should I tell my care team before I take this medication? They need to know if you have any of these conditions: An unusual or allergic reaction to fremanezumab, other medications, foods, dyes, or preservatives Pregnant or trying to get pregnant Breast-feeding How should I use this medication? This medication is injected under the skin. You will be taught how to prepare and give it. Take it as directed on the prescription label. Keep taking it unless your care team tells you to stop. It is important that you put your used needles and syringes in a special sharps container. Do not put them in a trash can. If you do not have a sharps container, call your pharmacist or care team to get one. Talk to your care team about the use of this medication in children. Special care may be needed. Overdosage: If you think you have taken too much of this medicine contact a poison control center or emergency room at once. NOTE: This medicine is only for you. Do not share this medicine with others. What if I miss a dose? If you miss a dose, take it as soon as you can. If it is almost time for your next dose, take only that dose. Do not take double or extra doses. What may interact with this medication? Interactions are not expected. This list may not describe all possible interactions. Give your health care provider a list of all the medicines, herbs, non-prescription drugs, or dietary supplements you use. Also tell them if you smoke, drink alcohol, or use illegal drugs. Some items may interact with your  medicine. What should I watch for while using this medication? Tell your care team if your symptoms do not start to get better or if they get worse. What side effects may I notice from receiving this medication? Side effects that you should report to your care team as soon as possible: Allergic reactions or angioedema--skin rash, itching or hives, swelling of the face, eyes, lips, tongue, arms, or legs, trouble swallowing or breathing Side effects that usually do not require medical attention (report to your care team if they continue or are bothersome): Pain, redness, or irritation at injection site This list may not describe all possible side effects. Call your doctor for medical advice about side effects. You may report side effects to FDA at 1-800-FDA-1088. Where should I keep my medication? Keep out of the reach of children and pets. Store in a refrigerator or at room temperature between 20 and 25 degrees C (68 and 77 degrees F). Refrigeration (preferred): Store in the refrigerator. Do not freeze. Keep in the original container until you are ready to take it. Remove the dose from the carton about 30 minutes before it is time for you to use it. If the dose is not used, it may be stored in the original container at room temperature for 7 days. Get rid of any unused medication after the expiration date. Room Temperature: This medication may be stored at room temperature for up to 7 days. Keep it in the original container. Protect from light until time of use.  If it is stored at room temperature, get rid of any unused medication after 7 days or after it expires, whichever is first. To get rid of medications that are no longer needed or have expired: Take the medication to a medication take-back program. Check with your pharmacy or law enforcement to find a location. If you cannot return the medication, ask your pharmacist or care team how to get rid of this medication safely. NOTE: This sheet is a  summary. It may not cover all possible information. If you have questions about this medicine, talk to your doctor, pharmacist, or health care provider.  2023 Elsevier/Gold Standard (2016-12-30 00:00:00)    Non-Drug Treatment for Low Blood Pressure on Standing:  1. Changing Postures:  Change posture slowly when getting up, especially in the morning  Hold on to something during the first few minutes after standing up. Do not start walking as soon as you get up from the chair  Avoid prolonged recumbency or lying down  Raise the head of the bed by 10 to 20 degrees  2. Exercise:  Perform Isotonic exercise, e.g.recumbent bike, pedaling movements while sitting in a chair  Avoid exercises where you have to strain   3. Avoid Pooling of blood in legs:  Wear custom-fitted elastic stockings. The ones which extend to the abdomen work even better. Consider wearing an abdominal binder.  Perform physical counter-maneuvers, such as crossing legs and tensing leg muscles.  4. Eating and Drinking:  Small meals are recommended. Avoid large meals. Avoid standing suddenly after a large meal  Avoid alcohol  Increase intake of fluids and regular salt. A daily intake of up to 10 grams of sodium per day and a fluid intake of 2.0 to 2.5 liters per day (8 to 10 glasses of water) is recommended.   Rapid (over 3 minutes) ingestion of approximately 0.5 liter (2 glasses of water) of tap water, raises blood pressure within 5 to 15 minutes and lasts for an hour.  5. Other Tips:  Avoid hot baths. Instead take warm baths  Maintain a BP record standing and lying down  If you are only any BP lowering drugs (antihypertensives, diuretics, antidepressants, drugs for prostate, etc) ask your doctor to revisit the need to keep you on these drugs  If all non-drug therapy fails, ask your doctor about drug therapy   Follow-up with primary care physician.

## 2022-03-10 ENCOUNTER — Other Ambulatory Visit: Payer: Self-pay

## 2022-03-11 ENCOUNTER — Ambulatory Visit: Payer: Self-pay

## 2022-03-12 ENCOUNTER — Encounter: Payer: Self-pay | Admitting: Nurse Practitioner

## 2022-03-13 ENCOUNTER — Ambulatory Visit (HOSPITAL_COMMUNITY)
Admission: RE | Admit: 2022-03-13 | Discharge: 2022-03-13 | Disposition: A | Discharge status: Home / Self Care | Payer: Self-pay | Source: Ambulatory Visit | Attending: Neurology | Admitting: Neurology

## 2022-03-13 DIAGNOSIS — R9089 Other abnormal findings on diagnostic imaging of central nervous system: Secondary | ICD-10-CM | POA: Insufficient documentation

## 2022-03-13 DIAGNOSIS — I619 Nontraumatic intracerebral hemorrhage, unspecified: Secondary | ICD-10-CM | POA: Insufficient documentation

## 2022-03-13 DIAGNOSIS — R55 Syncope and collapse: Secondary | ICD-10-CM | POA: Insufficient documentation

## 2022-03-13 DIAGNOSIS — R42 Dizziness and giddiness: Secondary | ICD-10-CM | POA: Insufficient documentation

## 2022-03-13 MED ORDER — IOHEXOL 350 MG/ML SOLN
75.0000 mL | Freq: Once | INTRAVENOUS | Status: AC | PRN
  Administered 2022-03-13: 75 mL via INTRAVENOUS

## 2022-03-14 ENCOUNTER — Encounter: Payer: Self-pay | Admitting: Neurology

## 2022-03-18 ENCOUNTER — Ambulatory Visit: Payer: Self-pay | Attending: Family Medicine

## 2022-03-28 ENCOUNTER — Encounter: Payer: Self-pay | Admitting: Nurse Practitioner

## 2022-03-28 ENCOUNTER — Other Ambulatory Visit: Payer: Self-pay

## 2022-03-29 ENCOUNTER — Other Ambulatory Visit: Payer: Self-pay | Admitting: Nurse Practitioner

## 2022-03-29 DIAGNOSIS — F419 Anxiety disorder, unspecified: Secondary | ICD-10-CM

## 2022-03-29 MED ORDER — BUSPIRONE HCL 10 MG PO TABS
10.0000 mg | ORAL_TABLET | Freq: Two times a day (BID) | ORAL | 3 refills | Status: DC
  Filled 2022-03-29: qty 60, 20d supply, fill #0

## 2022-03-31 ENCOUNTER — Other Ambulatory Visit: Payer: Self-pay

## 2022-04-10 ENCOUNTER — Other Ambulatory Visit: Payer: Self-pay

## 2022-04-10 ENCOUNTER — Encounter: Payer: Self-pay | Admitting: Nurse Practitioner

## 2022-04-11 ENCOUNTER — Other Ambulatory Visit: Payer: Self-pay | Admitting: Nurse Practitioner

## 2022-04-11 DIAGNOSIS — G4733 Obstructive sleep apnea (adult) (pediatric): Secondary | ICD-10-CM

## 2022-04-15 ENCOUNTER — Other Ambulatory Visit: Payer: Self-pay

## 2022-04-15 ENCOUNTER — Other Ambulatory Visit: Payer: Self-pay | Admitting: Nurse Practitioner

## 2022-04-15 DIAGNOSIS — F419 Anxiety disorder, unspecified: Secondary | ICD-10-CM

## 2022-04-15 MED ORDER — HYDROXYZINE HCL 10 MG PO TABS
10.0000 mg | ORAL_TABLET | Freq: Three times a day (TID) | ORAL | 0 refills | Status: DC | PRN
  Filled 2022-04-15: qty 60, 10d supply, fill #0

## 2022-04-17 ENCOUNTER — Encounter: Payer: Self-pay | Admitting: Internal Medicine

## 2022-04-18 ENCOUNTER — Other Ambulatory Visit: Payer: Self-pay | Admitting: Nurse Practitioner

## 2022-04-18 DIAGNOSIS — F32A Depression, unspecified: Secondary | ICD-10-CM

## 2022-04-24 ENCOUNTER — Encounter (HOSPITAL_COMMUNITY): Payer: Self-pay | Admitting: Physician Assistant

## 2022-04-24 ENCOUNTER — Ambulatory Visit (INDEPENDENT_AMBULATORY_CARE_PROVIDER_SITE_OTHER): Payer: No Payment, Other | Admitting: Physician Assistant

## 2022-04-24 ENCOUNTER — Other Ambulatory Visit: Payer: Self-pay

## 2022-04-24 DIAGNOSIS — F32A Depression, unspecified: Secondary | ICD-10-CM

## 2022-04-24 DIAGNOSIS — F411 Generalized anxiety disorder: Secondary | ICD-10-CM | POA: Diagnosis not present

## 2022-04-24 MED ORDER — FLUOXETINE HCL 20 MG PO CAPS
20.0000 mg | ORAL_CAPSULE | Freq: Every day | ORAL | 1 refills | Status: DC
  Filled 2022-04-24: qty 30, 30d supply, fill #0

## 2022-04-24 MED ORDER — FLUOXETINE HCL 10 MG PO CAPS
ORAL_CAPSULE | ORAL | 0 refills | Status: DC
  Filled 2022-04-24: qty 56, 30d supply, fill #0

## 2022-04-24 NOTE — Progress Notes (Signed)
Psychiatric Initial Adult Assessment   Patient Identification: Juan Kline MRN:  [id number] Date of Evaluation:  04/24/2022 Referral Source: Referred by Primary Care Provider Chief Complaint:   Chief Complaint  Patient presents with   Establish Care   Medication Management   Visit Diagnosis:    ICD-10-CM   1. GAD (generalized anxiety disorder)  F41.1 FLUoxetine (PROZAC) 10 MG capsule    FLUoxetine (PROZAC) 20 MG capsule    2. Mild depression  F32.A FLUoxetine (PROZAC) 10 MG capsule    FLUoxetine (PROZAC) 20 MG capsule      History of Present Illness:    Juan Kline "Juan Kline" is a 52 year old, Caucasian male with a past psychiatric history significant for anxiety who presents to [facility] to establish care and for medication management.  Patient presents today stating that he has been dealing with anxiety for roughly 4 years.  Patient reports that he has been on medications in the past reporting that he was last on Zoloft roughly 4 years but did not find the medication helpful.  Patient was on roughly 100 to 150 mg of Zoloft prior to discontinuing.  Patient reports that he has also utilized buspirone, Lexapro, and hydroxyzine for the management of his anxiety.  Patient is not currently on any medications for the management of his anxiety at this time.  Patient rates his anxiety at 10 out of 10 and states that his symptoms are mostly physical.  Patient endorses several symptoms/physical manifestations related to his anxiety including weakness in his neck, major ringing in his ears, fullness in ears, rapid irregular heart rate/palpitations, fatigue, lump in his throat, labored breathing, trembling, unsteadiness of his feet, and the sensation as if he is about to pass out.  Patient notes that his anxiety is aggravated when going out somewhere such as to appointments or to the store.  Due to his anxiety, patient states that he tends to not to go anywhere  out of fear of feeling worse.  Patient also notes that his anxiety is sensitive to sound and other lighting.  Patient describes his anxiety as having anxiety.  He reports having no quality of life due to his anxiety and has not been able to live his life for at least 5 years.  Patient states that he has been in the ED numerous times due to anxiety and panic attacks.  Symptoms of his panic attacks include hot sensation of the arms, elevated blood pressure and pulse, numbness (of the face and mouth), and tunnel vision.  Patient states that his panic attacks feel like what a stroke would feel like.  Whenever he has his panic attacks, patient states that he often feels like he is going to die at times.  Once his panic attacks/anxiety subsides, he feels very fatigued and wiped out.  Patient denies depression but states that he feels down about his anxiety.  Patient states that he has had multiple test performed determine organic causes to his anxiety but most tests have come back normal.  Due to his anxiety, patient states that he mostly spends time at home and is mostly inactive.  Q. 9 screen was performed with the patient scoring an 11.  A GAD-7 screen was also performed with the patient scoring a 14.  Patient is alert and oriented x 4, calm, cooperative, and fully engaged in conversation during the encounter.  Patient states that he is hopeful that he will find some solution to the management of his anxiety.  Patient denies suicidal or homicidal ideations.  He further denies auditory or visual hallucinations and does not appear to be responding to internal/external stimuli.  Patient denies paranoia or delusional thoughts.  Patient endorses good sleep and receives on average 7 to 8 hours of sleep each night.  Patient endorses good appetite and eats on average 3 meals per day.  Associated Signs/Symptoms: Depression Symptoms:  depressed mood, fatigue, feelings of worthlessness/guilt, hopelessness, weight  gain, increased appetite, (Hypo) Manic Symptoms:  Flight of Ideas, Irritable Mood, Labiality of Mood, Anxiety Symptoms:  Excessive Worry, Panic Symptoms, Psychotic Symptoms:   Patient denies psychotic symptoms PTSD Symptoms: Negative  Past Psychiatric History:  Anxiety  Previous Psychotropic Medications: Yes   Substance Abuse History in the last 12 months:  No.  Consequences of Substance Abuse: Negative  Past Medical History:  Past Medical History:  Diagnosis Date   Aortic atherosclerosis ([place])    Chronic foot ulcer ([place]) admitted 03/02/2014   medial border first metatarsal left foot  - resolved per patient 03/20/20   Diabetes ([place])    type 2 - no meds, diet controlled   Diabetic foot infection ([place])    First degree AV block    GAD (generalized anxiety disorder)    GERD (gastroesophageal reflux disease)    Hx of migraines    Hyperlipidemia    Hypertension Dx 2009   Mild depression    Osteomyelitis left first metatarsal 03/02/2014   Sleep apnea    not using CPAP- machine being looked at for repair 03/21/20    Venous stasis ulcer of left lower extremity ([place])    [name] 03/02/2014 - resolved per patient 03/20/20    Past Surgical History:  Procedure Laterality Date   BIOPSY  08/18/2019   Procedure: BIOPSY;  Surgeon: Thornton Park, MD;  Location: [facility];  Service: Gastroenterology;;   ESOPHAGOGASTRODUODENOSCOPY (EGD) WITH PROPOFOL N/A 08/18/2019   Procedure: ESOPHAGOGASTRODUODENOSCOPY (EGD) WITH PROPOFOL;  Surgeon: Thornton Park, MD;  Location: [facility];  Service: Gastroenterology;  Laterality: N/A;    Family Psychiatric History:  Patient reports that his mother was admitted for the management of her depression (possibly postpartum depression) shortly after his birth.  He reports that his mother was on Prozac in the past  Patient denies a past history of suicide attempt in his family Patient denies past history of homicide within his family Patient  denies past history of substance abuse within his family  Family History:  Family History  Problem Relation Age of Onset   Hypertension Mother    Hyperlipidemia Mother    Diabetes Mother    Kidney disease Mother    Hypertension Father    Hyperlipidemia Father    Diabetes Brother    Hypertension Brother    Hyperlipidemia Brother    Heart disease Maternal Grandmother    COPD Maternal Grandmother    Diabetes Maternal Grandfather    Heart attack Paternal Grandmother    Heart disease Paternal Grandfather    Colon cancer Neg Hx    Colon polyps Neg Hx    Stomach cancer Neg Hx    Esophageal cancer Neg Hx    Ulcerative colitis Neg Hx     Social History:   Social History   Socioeconomic History   Marital status: Single    Spouse name: Not on file   Number of children: 0   Years of education: Not on file   Highest education level: Not on file  Occupational History   Not on file  Tobacco  Use   Smoking status: Former    Packs/day: 1.50    Years: 26.00    Total pack years: 39.00    Types: E-cigarettes, Cigarettes    Start date: 1989    Quit date: 02/16/2014    Years since quitting: 8.1   Smokeless tobacco: Never   Tobacco comments:    occasional vapes - no nicotine  Vaping Use   Vaping Use: Former   Start date: 04/15/2015   Quit date: 04/11/2019   Substances: Nicotine  Substance and Sexual Activity   Alcohol use: Not Currently    Comment: None 03/28/21, 03/02/2014 "I rarely drink"   Drug use: No   Sexual activity: Not Currently  Other Topics Concern   Not on file  Social History Narrative   Pt lives in 1 story home with his mother and step father   9th grade education   Last occupation was with [employer] in 2009      Unemployed at this time- thinking of filing for disability      Right handed      Social Determinants of Health   Financial Resource Strain: Not on file  Food Insecurity: Not on file  Transportation Needs: Not on file  Physical Activity: Not on  file  Stress: Not on file  Social Connections: Not on file    Additional Social History:  Patient endorses social support through his mother and father.  Patient denies having children of his own.  Patient endorses housing and states that he is currently living with his mother.  Patient denies employment stating that he is unable to work due to his anxiety.  Patient denies past history of military experience.  Patient denies past history of prison or jail time.  Patient states that he did not complete high school.  Patient denies there being weapons in the home.  Allergies:   Allergies  Allergen Reactions   Lyrica [Pregabalin] Anxiety    Metabolic Disorder Labs: Lab Results  Component Value Date   HGBA1C 6.4 02/28/2022   MPG 148.46 08/17/2019   MPG 134 (H) 03/03/2014   No results found for: "PROLACTIN" Lab Results  Component Value Date   CHOL 182 02/28/2022   TRIG 142 02/28/2022   HDL 49 02/28/2022   CHOLHDL 3.7 02/28/2022   VLDL 35.8 03/28/2021   LDLCALC 108 (H) 02/28/2022   LDLCALC 89 03/28/2021   Lab Results  Component Value Date   TSH 2.310 11/18/2021    Therapeutic Level Labs: No results found for: "LITHIUM" No results found for: "CBMZ" No results found for: "VALPROATE"  Current Medications: Current Outpatient Medications  Medication Sig Dispense Refill   FLUoxetine (PROZAC) 10 MG capsule Take 1 capsule (10 mg total) by mouth daily for 4 days, THEN 2 capsules (20 mg total) daily. 56 capsule 0   FLUoxetine (PROZAC) 20 MG capsule Take 1 capsule (20 mg total) by mouth daily. 30 capsule 1   hydrOXYzine (ATARAX) 10 MG tablet Take 1-2 tablets (10-20 mg total) by mouth 3 (three) times daily as needed. For anxiety 60 tablet 0   amLODipine (NORVASC) 10 MG tablet Take 1 tablet by mouth daily. 90 tablet 1   aspirin 81 MG EC tablet Take 1 tablet (81 mg total) by mouth daily. Swallow whole. 30 tablet 11   atorvastatin (LIPITOR) 40 MG tablet Take 1 and 1/2 tablets by mouth  daily. 45 tablet 6   Dulaglutide (TRULICITY) 0.08 QP/6.1PJ SOPN Inject 0.75 mg into the skin once a week. 2  mL 1   fenofibrate (TRICOR) 145 MG tablet Take 1 tablet (145 mg total) by mouth daily. 90 tablet 3   glucose blood test strip Check blood sugar once daily fasting or 2 hours after a meal 100 each 12   losartan (COZAAR) 100 MG tablet Take 1 tablet by mouth daily. 90 tablet 1   Magnesium 400 MG CAPS Take 400 mg by mouth daily.     omeprazole (PRILOSEC) 20 MG capsule Take 1 capsule (20 mg total) by mouth 2 (two) times daily before a meal. 180 capsule 1   Current Facility-Administered Medications  Medication Dose Route Frequency Provider Last Rate Last Admin   Fremanezumab-vfrm SOSY 225 mg  225 mg Subcutaneous Once Melvenia Beam, MD        Musculoskeletal: Strength & Muscle Tone: within normal limits Gait & Station: normal Patient leans: N/A  Psychiatric Specialty Exam: Review of Systems  Psychiatric/Behavioral:  Negative for decreased concentration, dysphoric mood, hallucinations, self-injury, sleep disturbance and suicidal ideas. The patient is nervous/anxious. The patient is not hyperactive.     There were no vitals taken for this visit.There is no height or weight on file to calculate BMI.  General Appearance: Fairly Groomed  Eye Contact:  Good  Speech:  Clear and Coherent and Normal Rate  Volume:  Normal  Mood:  Anxious  Affect:  Congruent  Thought Process:  Coherent, Goal Directed, and Descriptions of Associations: Intact  Orientation:  Full (Time, Place, and Person)  Thought Content:  WDL  Suicidal Thoughts:  No  Homicidal Thoughts:  No  Memory:  Immediate;   Good Recent;   Good Remote;   Good  Judgement:  Good  Insight:  Fair  Psychomotor Activity:  Normal  Concentration:  Concentration: Good and Attention Span: Good  Recall:  Good  Fund of Knowledge:Good  Language: Good  Akathisia:  No  Handed:  Right  AIMS (if indicated):  not done  Assets:  Communication  Skills Desire for Improvement Housing Social Support  ADL's:  Intact  Cognition: WNL  Sleep:  Good   Screenings: GAD-7    Flowsheet Row Office Visit from 04/24/2022 in [facility] Office Visit from 02/28/2022 in [place] Office Visit from 11/18/2021 in [place] Office Visit from 12/28/2020 in [place] Office Visit from 08/27/2020 in [place]  Total GAD-7 Score '14 1 3 15 6      '$ PHQ2-9    [place] Office Visit from 04/24/2022 in [facility] Office Visit from 02/28/2022 in [place] Office Visit from 11/18/2021 in [place] from [facility]/16/2021 [facility] Office Visit from 06/27/2021 in [place]  PHQ-2 Total Score '3 2 2 1 2  '$ PHQ-9 Total Score '11 5 4 '$ -- [place] Office Visit from 04/24/2022 in [facility] ED to Hosp-Admission (Discharged) from 01/03/2021 in [place] from 04/01/2018 in [place] No Risk No Risk Low Risk       Assessment and Plan:   Abrar Bilton "Juan Kline" is a 52 year old, Caucasian male with a past psychiatric history significant for anxiety who presents to [facility] to establish care and for  medication management.  Patient presents today with a chief complaint of worsening anxiety that has been ongoing for the last 5 years.  Patient has tried numerous medications for the management of his anxiety including Zoloft (was on Zoloft for 4 years before discontinuing), BuSpar, Lexapro, and hydroxyzine.  Patient denies depression but states that any depression that he  does experience is attributed to his anxiety.  Due to his anxiety, patient is fearful of going out into public spaces out of fear his anxiety will be worsened.  Patient also notes that his anxiety has contributed to him being an active and staying at home.  Patient was recommended Prozac 10 mg for the first 4 days, followed by 20 mg daily for the management of his anxiety and mild depression.  Patient was agreeable to recommendation.  Patient's medication to be e-prescribed to pharmacy of choice.  Collaboration of Care: Medication Management AEB provider managing patient's psychiatric medications, Primary Care Provider AEB patient being followed by a primary care provider, and Psychiatrist AEB patient being followed by a mental health provider  Patient/Guardian was advised Release of Information must be obtained prior to any record release in order to collaborate their care with an outside provider. Patient/Guardian was advised if they have not already done so to contact the registration department to sign all necessary forms in order for [name] to release information regarding their care.   Consent: Patient/Guardian gives verbal consent for treatment and assignment of benefits for services provided during this visit. Patient/Guardian expressed understanding and agreed to proceed.  1. GAD (generalized anxiety disorder)  - FLUoxetine (PROZAC) 10 MG capsule; Take 1 capsule (10 mg total) by mouth daily for 4 days, THEN 2 capsules (20 mg total) daily.  Dispense: 56 capsule; Refill: 0 - FLUoxetine (PROZAC) 20 MG capsule; Take 1 capsule (20 mg total) by mouth daily.  Dispense: 30 capsule; Refill: 1  2. Mild depression  - FLUoxetine (PROZAC) 10 MG capsule; Take 1 capsule (10 mg total) by mouth daily for 4 days, THEN 2 capsules (20 mg total) daily.  Dispense: 56 capsule; Refill: 0 - FLUoxetine (PROZAC) 20 MG capsule; Take 1 capsule (20 mg total) by mouth daily.  Dispense: 30 capsule; Refill: 1  Patient to  follow-up in 6 weeks Provider spent a total of 47 minutes with the patient/reviewing patient's chart  Malachy Mood, PA 1/11/20247:04 PM

## 2022-04-29 ENCOUNTER — Other Ambulatory Visit (HOSPITAL_COMMUNITY): Payer: Self-pay

## 2022-04-29 ENCOUNTER — Telehealth (HOSPITAL_COMMUNITY): Payer: Self-pay | Admitting: *Deleted

## 2022-04-29 NOTE — Telephone Encounter (Signed)
Provider to contact patient regarding concerns over medication.

## 2022-04-29 NOTE — Telephone Encounter (Signed)
Patient called stated that the medication recently prescribed is making him ill & would like to speak with provider Trinna Post

## 2022-05-01 ENCOUNTER — Encounter (HOSPITAL_COMMUNITY): Payer: Self-pay | Admitting: Physician Assistant

## 2022-05-01 ENCOUNTER — Other Ambulatory Visit: Payer: Self-pay

## 2022-05-01 ENCOUNTER — Telehealth (INDEPENDENT_AMBULATORY_CARE_PROVIDER_SITE_OTHER): Payer: No Payment, Other | Admitting: Physician Assistant

## 2022-05-01 DIAGNOSIS — F411 Generalized anxiety disorder: Secondary | ICD-10-CM | POA: Diagnosis not present

## 2022-05-01 DIAGNOSIS — F32A Depression, unspecified: Secondary | ICD-10-CM | POA: Diagnosis not present

## 2022-05-01 MED ORDER — VENLAFAXINE HCL ER 37.5 MG PO CP24
37.5000 mg | ORAL_CAPSULE | Freq: Every day | ORAL | 1 refills | Status: DC
  Filled 2022-05-01: qty 30, 30d supply, fill #0

## 2022-05-01 NOTE — Progress Notes (Signed)
BH MD/PA/NP OP Progress Note  Virtual Visit via Video Note  I connected with Juan Kline on 05/01/22 at  2:00 PM EST by a video enabled telemedicine application and verified that I am speaking with the correct person using two identifiers.  Location: Patient: Home Provider: Clinic   I discussed the limitations of evaluation and management by telemedicine and the availability of in person appointments. The patient expressed understanding and agreed to proceed.  Follow Up Instructions:   I discussed the assessment and treatment plan with the patient. The patient was provided an opportunity to ask questions and all were answered. The patient agreed with the plan and demonstrated an understanding of the instructions.   The patient was advised to call back or seek an in-person evaluation if the symptoms worsen or if the condition fails to improve as anticipated.  I provided 12 minutes of non-face-to-face time during this encounter.  Malachy Mood, PA    05/01/2022 2:20 PM Juan Kline  MRN:  [id number]  Chief Complaint:  Chief Complaint  Patient presents with   Follow-up   Medication Management   HPI:   Juan Kline "[name]" is a 52 year old, Caucasian male with a past psychiatric history significant for generalized anxiety disorder who presents to [facility] via virtual video visit for follow-up and medication management.  Patient was last seen by this provider on 04/24/2022.  During his last encounter, patient was placed on Prozac 10 mg daily.  Patient reports that he has not been taking his Prozac due to feeling ill when taking the medication.  Patient reports that the medication made his physical symptoms of anxiety worse.  At the time of this encounter, patient states that his anxiety is not bad and rates it a 1 out of 10.  He states that when he becomes active or is going out, his anxiety gets worse.  Patient states that his body often feels as  if it is in fight or flight mode.  Patient states that he also notices himself "clinched up" at times.  Patient denies depression at this time and states that he feels worn down due to his anxiety.  A GAD-7 screen was performed with the patient scoring a 16.  Patient is alert and oriented x 4, calm, cooperative, and fully engaged in conversation during the encounter.  Patient endorses hopeful mood.  Patient denies suicidal or homicidal ideations.  He further denies auditory or visual hallucinations and does not appear to be responding to internal/external stimuli.  Patient endorses good sleep and receives on average 7 to 9 hours of sleep each night.  Patient endorses good appetite and eats on average 3 meals per day.  Patient denies alcohol consumption, tobacco use, and illicit drug use.  Visit Diagnosis:    ICD-10-CM   1. GAD (generalized anxiety disorder)  F41.1 venlafaxine XR (EFFEXOR-XR) 37.5 MG 24 hr capsule    2. Mild depression  F32.A venlafaxine XR (EFFEXOR-XR) 37.5 MG 24 hr capsule      Past Psychiatric History:  Generalized anxiety disorder Mild depression  Past Medical History:  Past Medical History:  Diagnosis Date   Aortic atherosclerosis ([place])    Chronic foot ulcer ([place]) admitted 03/02/2014   medial border first metatarsal left foot  - resolved per patient 03/20/20   Diabetes ([place])    type 2 - no meds, diet controlled   Diabetic foot infection ([place])    First degree AV block    GAD (generalized anxiety  disorder)    GERD (gastroesophageal reflux disease)    Hx of migraines    Hyperlipidemia    Hypertension Dx 2009   Mild depression    Osteomyelitis left first metatarsal 03/02/2014   Sleep apnea    not using CPAP- machine being looked at for repair 03/21/20    Venous stasis ulcer of left lower extremity ([place])    [name] 03/02/2014 - resolved per patient 03/20/20    Past Surgical History:  Procedure Laterality Date   BIOPSY  08/18/2019   Procedure: BIOPSY;  Surgeon:  Thornton Park, MD;  Location: [facility];  Service: Gastroenterology;;   ESOPHAGOGASTRODUODENOSCOPY (EGD) WITH PROPOFOL N/A 08/18/2019   Procedure: ESOPHAGOGASTRODUODENOSCOPY (EGD) WITH PROPOFOL;  Surgeon: Thornton Park, MD;  Location: [facility];  Service: Gastroenterology;  Laterality: N/A;    Family Psychiatric History:  Patient reports that his mother was admitted for the management of her depression (possibly postpartum depression) shortly after his birth.  He reports that his mother was on Prozac in the past   Patient denies a past history of suicide attempt in his family Patient denies past history of homicide within his family Patient denies past history of substance abuse within his family  Family History:  Family History  Problem Relation Age of Onset   Hypertension Mother    Hyperlipidemia Mother    Diabetes Mother    Kidney disease Mother    Hypertension Father    Hyperlipidemia Father    Diabetes Brother    Hypertension Brother    Hyperlipidemia Brother    Heart disease Maternal Grandmother    COPD Maternal Grandmother    Diabetes Maternal Grandfather    Heart attack Paternal Grandmother    Heart disease Paternal Grandfather    Colon cancer Neg Hx    Colon polyps Neg Hx    Stomach cancer Neg Hx    Esophageal cancer Neg Hx    Ulcerative colitis Neg Hx     Social History:  Social History   Socioeconomic History   Marital status: Single    Spouse name: Not on file   Number of children: 0   Years of education: Not on file   Highest education level: Not on file  Occupational History   Not on file  Tobacco Use   Smoking status: Former    Packs/day: 1.50    Years: 26.00    Total pack years: 39.00    Types: E-cigarettes, Cigarettes    Start date: 1989    Quit date: 02/16/2014    Years since quitting: 8.2   Smokeless tobacco: Never   Tobacco comments:    occasional vapes - no nicotine  Vaping Use   Vaping Use: Former   Start date: 04/15/2015    Quit date: 04/11/2019   Substances: Nicotine  Substance and Sexual Activity   Alcohol use: Not Currently    Comment: None 03/28/21, 03/02/2014 "I rarely drink"   Drug use: No   Sexual activity: Not Currently  Other Topics Concern   Not on file  Social History Narrative   Pt lives in 1 story home with his mother and step father   9th grade education   Last occupation was with [employer] in 2009      Unemployed at this time- thinking of filing for disability      Right handed      Social Determinants of Health   Financial Resource Strain: Not on file  Food Insecurity: Not on file  Transportation Needs: Not on  file  Physical Activity: Not on file  Stress: Not on file  Social Connections: Not on file    Allergies:  Allergies  Allergen Reactions   Lyrica [Pregabalin] Anxiety    Metabolic Disorder Labs: Lab Results  Component Value Date   HGBA1C 6.4 02/28/2022   MPG 148.46 08/17/2019   MPG 134 (H) 03/03/2014   No results found for: "PROLACTIN" Lab Results  Component Value Date   CHOL 182 02/28/2022   TRIG 142 02/28/2022   HDL 49 02/28/2022   CHOLHDL 3.7 02/28/2022   VLDL 35.8 03/28/2021   LDLCALC 108 (H) 02/28/2022   LDLCALC 89 03/28/2021   Lab Results  Component Value Date   TSH 2.310 11/18/2021   TSH 0.944 01/04/2021    Therapeutic Level Labs: No results found for: "LITHIUM" No results found for: "VALPROATE" No results found for: "CBMZ"  Current Medications: Current Outpatient Medications  Medication Sig Dispense Refill   hydrOXYzine (ATARAX) 10 MG tablet Take 1-2 tablets (10-20 mg total) by mouth 3 (three) times daily as needed. For anxiety 60 tablet 0   venlafaxine XR (EFFEXOR-XR) 37.5 MG 24 hr capsule Take 1 capsule (37.5 mg total) by mouth daily. 30 capsule 1   amLODipine (NORVASC) 10 MG tablet Take 1 tablet by mouth daily. 90 tablet 1   aspirin 81 MG EC tablet Take 1 tablet (81 mg total) by mouth daily. Swallow whole. 30 tablet 11    atorvastatin (LIPITOR) 40 MG tablet Take 1 and 1/2 tablets by mouth daily. 45 tablet 6   Dulaglutide (TRULICITY) 5.46 TK/3.5WS SOPN Inject 0.75 mg into the skin once a week. 2 mL 1   fenofibrate (TRICOR) 145 MG tablet Take 1 tablet (145 mg total) by mouth daily. 90 tablet 3   glucose blood test strip Check blood sugar once daily fasting or 2 hours after a meal 100 each 12   losartan (COZAAR) 100 MG tablet Take 1 tablet by mouth daily. 90 tablet 1   Magnesium 400 MG CAPS Take 400 mg by mouth daily.     omeprazole (PRILOSEC) 20 MG capsule Take 1 capsule (20 mg total) by mouth 2 (two) times daily before a meal. 180 capsule 1   Current Facility-Administered Medications  Medication Dose Route Frequency Provider Last Rate Last Admin   Fremanezumab-vfrm SOSY 225 mg  225 mg Subcutaneous Once Melvenia Beam, MD         Musculoskeletal: Strength & Muscle Tone: within normal limits Gait & Station: normal Patient leans: N/A  Psychiatric Specialty Exam: Review of Systems  Psychiatric/Behavioral:  Negative for decreased concentration, dysphoric mood, hallucinations, self-injury, sleep disturbance and suicidal ideas. The patient is nervous/anxious. The patient is not hyperactive.     There were no vitals taken for this visit.There is no height or weight on file to calculate BMI.  General Appearance: Casual and Well Groomed  Eye Contact:  Good  Speech:  Clear and Coherent and Normal Rate  Volume:  Normal  Mood:  Anxious and Euthymic  Affect:  Congruent  Thought Process:  Coherent, Goal Directed, and Descriptions of Associations: Intact  Orientation:  Full (Time, Place, and Person)  Thought Content: WDL   Suicidal Thoughts:  No  Homicidal Thoughts:  No  Memory:  Immediate;   Good Recent;   Good Remote;   Good  Judgement:  Good  Insight:  Good  Psychomotor Activity:  Normal  Concentration:  Concentration: Good and Attention Span: Good  Recall:  Good  Fund of Knowledge: Good  Language:  Good  Akathisia:  No  Handed:  Right  AIMS (if indicated): not done  Assets:  Communication Skills Desire for Improvement Housing Social Support  ADL's:  Intact  Cognition: WNL  Sleep:  Good   Screenings: GAD-7    Flowsheet Row Video Visit from 05/01/2022 in [facility] Office Visit from 04/24/2022 in [facility] Office Visit from 02/28/2022 in [place] Office Visit from 11/18/2021 in [place] Office Visit from 12/28/2020 in [facility]  Total GAD-7 Score '16 14 1 3 15      '$ PHQ2-9    Flowsheet Row Video Visit from 05/01/2022 in [facility] Office Visit from 04/24/2022 in [facility] Office Visit from 02/28/2022 in [place] Office Visit from 11/18/2021 in [place] [facility] from [facility]16/2021 [facility]  PHQ-2 Total Score '1 3 2 2 1  '$ PHQ-9 Total Score -- '11 5 4 '$ --      Flowsheet Row Video Visit from 05/01/2022 in [facility] Office Visit from 04/24/2022 in [facility] ED to Hosp-Admission (Discharged) from 01/03/2021 in [place] No Risk No Risk No Risk        Assessment and Plan:   Juan Kline "[name]" is a 52 year old, Caucasian male with a past psychiatric history significant for generalized anxiety disorder who presents to [facility] via virtual video visit for follow-up and medication management.  Patient reports that his use of Prozac made him feel ill and worsened his physical symptoms of anxiety.  Although patient rates his anxiety low during the time of the encounter, he reports that his anxiety elevates when active or when venturing  outside his home.  Patient was recommended discontinuing Prozac and starting venlafaxine XR 37.5 mg daily for the management of his anxiety.  Patient was agreeable to recommendation.  Patient's medication to be e-prescribed to pharmacy of choice.  Collaboration of Care: Collaboration of Care: Medication Management AEB provider managing patient's psychiatric medications, Primary Care Provider AEB patient being followed by primary care provider, and Psychiatrist AEB patient being followed by mental health provider  Patient/Guardian was advised Release of Information must be obtained prior to any record release in order to collaborate their care with an outside provider. Patient/Guardian was advised if they have not already done so to contact the registration department to sign all necessary forms in order for [name] to release information regarding their care.   Consent: Patient/Guardian gives verbal consent for treatment and assignment of benefits for services provided during this visit. Patient/Guardian expressed understanding and agreed to proceed.   1. GAD (generalized anxiety disorder)  - venlafaxine XR (EFFEXOR-XR) 37.5 MG 24 hr capsule; Take 1 capsule (37.5 mg total) by mouth daily.  Dispense: 30 capsule; Refill: 1  2. Mild depression  - venlafaxine XR (EFFEXOR-XR) 37.5 MG 24 hr capsule; Take 1 capsule (37.5 mg total) by mouth daily.  Dispense: 30 capsule; Refill: 1  Patient to keep follow-up appointment scheduled for 06/05/2022 with this provider Provider spent a total of 12 minutes with the patient/reviewing patient's chart  Malachy Mood, PA 05/01/2022, 2:20 PM

## 2022-05-09 ENCOUNTER — Other Ambulatory Visit: Payer: Self-pay

## 2022-05-09 ENCOUNTER — Encounter: Payer: Self-pay | Admitting: Nurse Practitioner

## 2022-05-09 ENCOUNTER — Encounter (HOSPITAL_COMMUNITY): Payer: Self-pay | Admitting: Physician Assistant

## 2022-05-09 ENCOUNTER — Telehealth (INDEPENDENT_AMBULATORY_CARE_PROVIDER_SITE_OTHER): Payer: No Payment, Other | Admitting: Physician Assistant

## 2022-05-09 DIAGNOSIS — F411 Generalized anxiety disorder: Secondary | ICD-10-CM | POA: Diagnosis not present

## 2022-05-09 MED ORDER — DULOXETINE HCL 20 MG PO CPEP
20.0000 mg | ORAL_CAPSULE | Freq: Every day | ORAL | 0 refills | Status: DC
  Filled 2022-05-09: qty 30, 30d supply, fill #0

## 2022-05-09 NOTE — Progress Notes (Signed)
BH MD/PA/NP OP Progress Note  Virtual Visit via Video Note  I connected with Juan Kline on 05/09/22 at 10:00 AM EST by a video enabled telemedicine application and verified that I am speaking with the correct person using two identifiers.  Location: Patient: Home Provider: Clinic   I discussed the limitations of evaluation and management by telemedicine and the availability of in person appointments. The patient expressed understanding and agreed to proceed.  Follow Up Instructions:   I discussed the assessment and treatment plan with the patient. The patient was provided an opportunity to ask questions and all were answered. The patient agreed with the plan and demonstrated an understanding of the instructions.   The patient was advised to call back or seek an in-person evaluation if the symptoms worsen or if the condition fails to improve as anticipated.  I provided 16 minutes of non-face-to-face time during this encounter.  Malachy Mood, PA   05/09/2022 11:47 AM Juan Kline  MRN:  [id number]  Chief Complaint:  Chief Complaint  Patient presents with   Follow-up   Other    Walk-in with chief complaint of anxiety   Medication Management   HPI:   Juan Kline "Juan Kline" is a 52 year old, Caucasian male with a past psychiatric history significant for generalized anxiety disorder who presents to [facility] via virtual video visit for follow-up with medication management.  Patient is currently being managed on the following medication: Venlafaxine XR (Effexor XR) 37.5 mg 24-hour capsule daily.  Patient reports that he feels more jittery on the medication.  He reports that he had an anxious episode that woke him up in the middle of the night characterized by elevated heart rate and difficulty breathing.  Patient states that he was not able to go back to sleep until 2 or 3 hours later after being woken up.  Patient reports that he last took the  medication a couple of days ago.  Patient rates his anxiety a 10 out of 10 and endorses the following physical symptoms: muscle tension, muscle weakness, ringing in the ears, sensitivity to sound and light, and fatigue.  Patient reports that he feels worn out from being tense all the time.  Patient denies depression at this time.  A GAD-7 screen was performed with the patient scoring a 9.  Patient is alert and oriented x 4, calm, cooperative, and fully engaged in conversation during the encounter.  Patient endorses frustrated mood.  Patient denies suicidal or homicidal ideations.  He further denied auditory or visual hallucinations and does not appear to be responding to internal/external stimuli.  Patient endorses fair sleep stating that he receives on average 6 hours of sleep per night.  Patient states that it often takes him a while to get to sleep.  Patient endorses good appetite and eats on average 3 meals per day.  Patient denies alcohol consumption, tobacco use, and illicit drug use.  Visit Diagnosis:    ICD-10-CM   1. GAD (generalized anxiety disorder)  F41.1 DULoxetine (CYMBALTA) 20 MG capsule      Past Psychiatric History:  Generalized anxiety disorder Mild depression  Past Medical History:  Past Medical History:  Diagnosis Date   Aortic atherosclerosis ([place])    Chronic foot ulcer ([place]) admitted 03/02/2014   medial border first metatarsal left foot  - resolved per patient 03/20/20   Diabetes ([place])    type 2 - no meds, diet controlled   Diabetic foot infection ([place])  First degree AV block    GAD (generalized anxiety disorder)    GERD (gastroesophageal reflux disease)    Hx of migraines    Hyperlipidemia    Hypertension Dx 2009   Mild depression    Osteomyelitis left first metatarsal 03/02/2014   Sleep apnea    not using CPAP- machine being looked at for repair 03/21/20    Venous stasis ulcer of left lower extremity ([place])    [name] 03/02/2014 - resolved per patient 03/20/20     Past Surgical History:  Procedure Laterality Date   BIOPSY  08/18/2019   Procedure: BIOPSY;  Surgeon: Thornton Park, MD;  Location: [facility];  Service: Gastroenterology;;   ESOPHAGOGASTRODUODENOSCOPY (EGD) WITH PROPOFOL N/A 08/18/2019   Procedure: ESOPHAGOGASTRODUODENOSCOPY (EGD) WITH PROPOFOL;  Surgeon: Thornton Park, MD;  Location: [facility];  Service: Gastroenterology;  Laterality: N/A;    Family Psychiatric History:  Patient reports that his mother was admitted for the management of her depression (possibly postpartum depression) shortly after his birth.  He reports that his mother was on Prozac in the past   Patient denies a past history of suicide attempt in his family Patient denies past history of homicide within his family Patient denies past history of substance abuse within his family  Family History:  Family History  Problem Relation Age of Onset   Hypertension Mother    Hyperlipidemia Mother    Diabetes Mother    Kidney disease Mother    Hypertension Father    Hyperlipidemia Father    Diabetes Brother    Hypertension Brother    Hyperlipidemia Brother    Heart disease Maternal Grandmother    COPD Maternal Grandmother    Diabetes Maternal Grandfather    Heart attack Paternal Grandmother    Heart disease Paternal Grandfather    Colon cancer Neg Hx    Colon polyps Neg Hx    Stomach cancer Neg Hx    Esophageal cancer Neg Hx    Ulcerative colitis Neg Hx     Social History:  Social History   Socioeconomic History   Marital status: Single    Spouse name: Not on file   Number of children: 0   Years of education: Not on file   Highest education level: Not on file  Occupational History   Not on file  Tobacco Use   Smoking status: Former    Packs/day: 1.50    Years: 26.00    Total pack years: 39.00    Types: E-cigarettes, Cigarettes    Start date: 1989    Quit date: 02/16/2014    Years since quitting: 8.2   Smokeless tobacco: Never    Tobacco comments:    occasional vapes - no nicotine  Vaping Use   Vaping Use: Former   Start date: 04/15/2015   Quit date: 04/11/2019   Substances: Nicotine  Substance and Sexual Activity   Alcohol use: Not Currently    Comment: None 03/28/21, 03/02/2014 "I rarely drink"   Drug use: No   Sexual activity: Not Currently  Other Topics Concern   Not on file  Social History Narrative   Pt lives in 1 story home with his mother and step father   9th grade education   Last occupation was with [employer] in 2009      Unemployed at this time- thinking of filing for disability      Right handed      Social Determinants of Health   Financial Resource Strain: Not on file  Food Insecurity: Not on file  Transportation Needs: Not on file  Physical Activity: Not on file  Stress: Not on file  Social Connections: Not on file    Allergies:  Allergies  Allergen Reactions   Lyrica [Pregabalin] Anxiety    Metabolic Disorder Labs: Lab Results  Component Value Date   HGBA1C 6.4 02/28/2022   MPG 148.46 08/17/2019   MPG 134 (H) 03/03/2014   No results found for: "PROLACTIN" Lab Results  Component Value Date   CHOL 182 02/28/2022   TRIG 142 02/28/2022   HDL 49 02/28/2022   CHOLHDL 3.7 02/28/2022   VLDL 35.8 03/28/2021   LDLCALC 108 (H) 02/28/2022   LDLCALC 89 03/28/2021   Lab Results  Component Value Date   TSH 2.310 11/18/2021   TSH 0.944 01/04/2021    Therapeutic Level Labs: No results found for: "LITHIUM" No results found for: "VALPROATE" No results found for: "CBMZ"  Current Medications: Current Outpatient Medications  Medication Sig Dispense Refill   DULoxetine (CYMBALTA) 20 MG capsule Take 1 capsule (20 mg total) by mouth daily. 30 capsule 0   hydrOXYzine (ATARAX) 10 MG tablet Take 1-2 tablets (10-20 mg total) by mouth 3 (three) times daily as needed. For anxiety 60 tablet 0   amLODipine (NORVASC) 10 MG tablet Take 1 tablet by mouth daily. 90 tablet 1   aspirin 81 MG  EC tablet Take 1 tablet (81 mg total) by mouth daily. Swallow whole. 30 tablet 11   atorvastatin (LIPITOR) 40 MG tablet Take 1 and 1/2 tablets by mouth daily. 45 tablet 6   Dulaglutide (TRULICITY) 8.09 XI/3.3AS SOPN Inject 0.75 mg into the skin once a week. 2 mL 1   fenofibrate (TRICOR) 145 MG tablet Take 1 tablet (145 mg total) by mouth daily. 90 tablet 3   glucose blood test strip Check blood sugar once daily fasting or 2 hours after a meal 100 each 12   losartan (COZAAR) 100 MG tablet Take 1 tablet by mouth daily. 90 tablet 1   Magnesium 400 MG CAPS Take 400 mg by mouth daily.     omeprazole (PRILOSEC) 20 MG capsule Take 1 capsule (20 mg total) by mouth 2 (two) times daily before a meal. 180 capsule 1   Current Facility-Administered Medications  Medication Dose Route Frequency Provider Last Rate Last Admin   Fremanezumab-vfrm SOSY 225 mg  225 mg Subcutaneous Once Melvenia Beam, MD         Musculoskeletal: Strength & Muscle Tone: within normal limits Gait & Station: normal Patient leans: N/A  Psychiatric Specialty Exam: Review of Systems  Psychiatric/Behavioral:  Negative for decreased concentration, dysphoric mood, hallucinations, self-injury, sleep disturbance and suicidal ideas. The patient is nervous/anxious. The patient is not hyperactive.     There were no vitals taken for this visit.There is no height or weight on file to calculate BMI.  General Appearance: Casual  Eye Contact:  Good  Speech:  Clear and Coherent and Normal Rate  Volume:  Normal  Mood:  Anxious  Affect:  Congruent  Thought Process:  Coherent, Goal Directed, and Descriptions of Associations: Intact  Orientation:  Full (Time, Place, and Person)  Thought Content: WDL   Suicidal Thoughts:  No  Homicidal Thoughts:  No  Memory:  Immediate;   Good Recent;   Good Remote;   Good  Judgement:  Good  Insight:  Good  Psychomotor Activity:  Normal  Concentration:  Concentration: Good and Attention Span: Good   Recall:  [place]  of Knowledge: Good  Language: Good  Akathisia:  No  Handed:  Right  AIMS (if indicated): not done  Assets:  Communication Skills Desire for Improvement Housing Social Support  ADL's:  Intact  Cognition: WNL  Sleep:  Fair   Screenings: GAD-7    Flowsheet Row Video Visit from 05/09/2022 in [facility] Video Visit from 05/01/2022 in [facility] Office Visit from 04/24/2022 in [facility] Office Visit from 02/28/2022 in [place] Office Visit from 11/18/2021 in [place]  Total GAD-7 Score '9 16 14 1 3      '$ PHQ2-9    Flowsheet Row Video Visit from 05/09/2022 in [facility] Video Visit from 05/01/2022 in [facility] Office Visit from 04/24/2022 in [facility] Office Visit from 02/28/2022 in [place] Office Visit from 11/18/2021 in [place]  PHQ-2 Total Score 0 '1 3 2 2  '$ PHQ-9 Total Score -- -- '11 5 4      '$ Flowsheet Row Video Visit from 05/09/2022 in [facility] Video Visit from 05/01/2022 in [facility] Office Visit from 04/24/2022 in [place] No Risk No Risk No Risk        Assessment and Plan:   Pal Shell "Juan Kline" is a 52 year old, Caucasian male with a past psychiatric history significant for generalized anxiety disorder who presents to [facility] via virtual video visit for follow-up with medication management.  He reports that his use of venlafaxine XR has been an effective in managing his anxiety.  He reported that his anxiety was so bad that it woke him up in the middle of the night and it took him 2  to 3 hours to fall back asleep.  Patient continues to endorse experiencing physical symptoms of anxiety.  Patient last took his medication 2 days ago.  Patient was recommended discontinuing venlafaxine XR and starting duloxetine 20 mg daily on Sunday for the management of his anxiety.  Patient was agreeable to recommendation.  Patient's medication to be e-prescribed to pharmacy of choice.  At the time of this encounter, patient has failed the following medications: Lexapro, Zoloft, Prozac, venlafaxine XR, buspirone, and gabapentin.  If  Collaboration of Care: Collaboration of Care: Medication Management AEB provider managing patient's psychiatric medications, Primary Care Provider AEB patient being seen by a licensed clinical social worker at [facility], and Psychiatrist AEB patient being followed by a mental health provider  Patient/Guardian was advised Release of Information must be obtained prior to any record release in order to collaborate their care with an outside provider. Patient/Guardian was advised if they have not already done so to contact the registration department to sign all necessary forms in order for [name] to release information regarding their care.   Consent: Patient/Guardian gives verbal consent for treatment and assignment of benefits for services provided during this visit. Patient/Guardian expressed understanding and agreed to proceed.   1. GAD (generalized anxiety disorder)  - DULoxetine (CYMBALTA) 20 MG capsule; Take 1 capsule (20 mg total) by mouth daily.  Dispense: 30 capsule; Refill: 0  Patient to keep follow-up appointment scheduled for 06/05/2022 with this provider Provider spent a total of 16 minutes with the patient/reviewing patient's chart  Malachy Mood, PA 05/09/2022, 11:47 AM

## 2022-05-12 ENCOUNTER — Other Ambulatory Visit: Payer: Self-pay | Admitting: Nurse Practitioner

## 2022-05-12 DIAGNOSIS — E1142 Type 2 diabetes mellitus with diabetic polyneuropathy: Secondary | ICD-10-CM

## 2022-05-13 ENCOUNTER — Other Ambulatory Visit: Payer: Self-pay

## 2022-05-13 MED ORDER — TRULICITY 0.75 MG/0.5ML ~~LOC~~ SOAJ
0.7500 mg | SUBCUTANEOUS | 1 refills | Status: DC
  Filled 2022-05-13: qty 2, 28d supply, fill #0
  Filled 2022-06-08: qty 2, 28d supply, fill #1

## 2022-05-13 NOTE — Telephone Encounter (Signed)
Requested Prescriptions  Pending Prescriptions Disp Refills   Dulaglutide (TRULICITY) 5.17 GY/1.7CB SOPN 2 mL 1    Sig: Inject 0.75 mg into the skin once a week.     Endocrinology:  Diabetes - GLP-1 Receptor Agonists Passed - 05/12/2022  9:11 AM      Passed - HBA1C is between 0 and 7.9 and within 180 days    HbA1c, POC (controlled diabetic range)  Date Value Ref Range Status  02/28/2022 6.4 0.0 - 7.0 % Final         Passed - Valid encounter within last 6 months    Recent Outpatient Visits           2 months ago Type 2 diabetes mellitus with diabetic polyneuropathy, without long-term current use of insulin [facility])   [place], Vernia Buff, NP   5 months ago [place] W, NP   1 year ago Migraine without aura and without status migrainosus, not intractable   [place], Deborah B, MD   1 year ago Type 2 diabetes mellitus with diabetic polyneuropathy, without long-term current use of insulin [facility])   [place] Ladell Pier, MD   1 year ago OSA (obstructive sleep apnea)   [place] Elsie Stain, MD       Future Appointments             In 1 month Gildardo Pounds, NP [place]

## 2022-05-15 ENCOUNTER — Other Ambulatory Visit (HOSPITAL_COMMUNITY): Payer: Self-pay

## 2022-05-28 ENCOUNTER — Ambulatory Visit: Payer: Self-pay | Admitting: Neurology

## 2022-06-02 ENCOUNTER — Other Ambulatory Visit: Payer: Self-pay

## 2022-06-05 ENCOUNTER — Encounter (HOSPITAL_COMMUNITY): Payer: Self-pay | Admitting: Physician Assistant

## 2022-06-05 ENCOUNTER — Other Ambulatory Visit: Payer: Self-pay

## 2022-06-05 ENCOUNTER — Telehealth (INDEPENDENT_AMBULATORY_CARE_PROVIDER_SITE_OTHER): Payer: Medicaid Other | Admitting: Physician Assistant

## 2022-06-05 DIAGNOSIS — F411 Generalized anxiety disorder: Secondary | ICD-10-CM | POA: Diagnosis not present

## 2022-06-05 MED ORDER — CLONAZEPAM 0.5 MG PO TABS
0.5000 mg | ORAL_TABLET | Freq: Two times a day (BID) | ORAL | 0 refills | Status: DC | PRN
  Filled 2022-06-05: qty 60, 30d supply, fill #0

## 2022-06-05 NOTE — Progress Notes (Signed)
BH MD/PA/NP OP Progress Note  Virtual Visit via Video Note  I connected with Juan Kline on 06/05/22 at  1:30 PM EST by a video enabled telemedicine application and verified that I am speaking with the correct person using two identifiers.  Location: Patient: Home Provider: Clinic   I discussed the limitations of evaluation and management by telemedicine and the availability of in person appointments. The patient expressed understanding and agreed to proceed.  Follow Up Instructions:  I discussed the assessment and treatment plan with the patient. The patient was provided an opportunity to ask questions and all were answered. The patient agreed with the plan and demonstrated an understanding of the instructions.   The patient was advised to call back or seek an in-person evaluation if the symptoms worsen or if the condition fails to improve as anticipated.  I provided 23 minutes of non-face-to-face time during this encounter.  Malachy Mood, PA    06/05/2022 6:09 PM Juan Kline  MRN:  [id number]  Chief Complaint:  Chief Complaint  Patient presents with   Follow-up   Medication Management   HPI:   Juan Kline "Juan Kline" is a 52 year old, Caucasian male with a past psychiatric history significant for generalized anxiety disorder and mild depression who presents to [facility] via virtual video visit for follow-up and medication management.  Patient is currently being managed on the following medication: Cymbalta 30 mg daily.  Patient reports that Cymbalta was not helpful in managing his symptoms and anxiety.  He reports that since taking the medication he has experienced feeling hot/feverish with flulike symptoms.  Patient continues to endorse heart palpitations so bad that he is unable to go out.  Patient states that he feels like he is sick and his symptoms are only getting worse.  Patient rates his anxiety at 10 out of 10 and denies any new  stressors at this time.  Patient states that his primary care provider has not been able to determine the cause of his elevated anxiety/heart palpitations.  Patient denies depression but states that he experiences low mood due to his uncontrolled anxiety.  A GAD-7 screen was performed with the patient scoring a 16.  Patient is alert and oriented x 4, calm, cooperative, and fully engaged in conversation during the encounter.  Patient describes his mood as hopeful.  Patient denies suicidal or homicidal ideations.  He further denies auditory or visual hallucinations and does not appear to be responding to internal/external stimuli.  Patient endorses good sleep and receives on average 8 to 9 hours of sleep each night.  Patient endorses good appetite and eats on average 3 meals per day.  Patient denies alcohol consumption, tobacco use, and illicit drug use.  Visit Diagnosis:    ICD-10-CM   1. GAD (generalized anxiety disorder)  F41.1 clonazePAM (KLONOPIN) 0.5 MG tablet      Past Psychiatric History:  Generalized anxiety disorder Mild depression  Past Medical History:  Past Medical History:  Diagnosis Date   Aortic atherosclerosis ([place])    Chronic foot ulcer ([place]) admitted 03/02/2014   medial border first metatarsal left foot  - resolved per patient 03/20/20   Diabetes ([place])    type 2 - no meds, diet controlled   Diabetic foot infection ([place])    First degree AV block    GAD (generalized anxiety disorder)    GERD (gastroesophageal reflux disease)    Hx of migraines    Hyperlipidemia    Hypertension Dx 2009  Mild depression    Osteomyelitis left first metatarsal 03/02/2014   Sleep apnea    not using CPAP- machine being looked at for repair 03/21/20    Venous stasis ulcer of left lower extremity ([place])    [name] 03/02/2014 - resolved per patient 03/20/20    Past Surgical History:  Procedure Laterality Date   BIOPSY  08/18/2019   Procedure: BIOPSY;  Surgeon: Thornton Park, MD;   Location: [facility];  Service: Gastroenterology;;   ESOPHAGOGASTRODUODENOSCOPY (EGD) WITH PROPOFOL N/A 08/18/2019   Procedure: ESOPHAGOGASTRODUODENOSCOPY (EGD) WITH PROPOFOL;  Surgeon: Thornton Park, MD;  [facility]: [facility];  Service: Gastroenterology;  Laterality: N/A;    Family Psychiatric History:  Patient reports that his mother was admitted for the management of her depression (possibly postpartum depression) shortly after his birth.  He reports that his mother was on Prozac in the past   Patient denies a past history of suicide attempt in his family Patient denies past history of homicide within his family Patient denies past history of substance abuse within his family  Family History:  Family History  Problem Relation Age of Onset   Hypertension Mother    Hyperlipidemia Mother    Diabetes Mother    Kidney disease Mother    Hypertension Father    Hyperlipidemia Father    Diabetes Brother    Hypertension Brother    Hyperlipidemia Brother    Heart disease Maternal Grandmother    COPD Maternal Grandmother    Diabetes Maternal Grandfather    Heart attack Paternal Grandmother    Heart disease Paternal Grandfather    Colon cancer Neg Hx    Colon polyps Neg Hx    Stomach cancer Neg Hx    Esophageal cancer Neg Hx    Ulcerative colitis Neg Hx     Social History:  Social History   Socioeconomic History   Marital status: Single    Spouse name: Not on file   Number of children: 0   Years of education: Not on file   Highest education level: Not on file  Occupational History   Not on file  Tobacco Use   Smoking status: Former    Packs/day: 1.50    Years: 26.00    Total pack years: 39.00    Types: E-cigarettes, Cigarettes    Start date: 1989    Quit date: 02/16/2014    Years since quitting: 8.3   Smokeless tobacco: Never   Tobacco comments:    occasional vapes - no nicotine  Vaping Use   Vaping Use: Former   Start date: 04/15/2015   Quit date: 04/11/2019    Substances: Nicotine  Substance and Sexual Activity   Alcohol use: Not Currently    Comment: None 03/28/21, 03/02/2014 "I rarely drink"   Drug use: No   Sexual activity: Not Currently  Other Topics Concern   Not on file  Social History Narrative   Pt lives in 1 story home with his mother and step father   9th grade education   Last occupation was with [employer] in 2009      Unemployed at this time- thinking of filing for disability      Right handed      Social Determinants of Health   Financial Resource Strain: Not on file  Food Insecurity: Not on file  Transportation Needs: Not on file  Physical Activity: Not on file  Stress: Not on file  Social Connections: Not on file    Allergies:  Allergies  Allergen  Reactions   Lyrica [Pregabalin] Anxiety    Metabolic Disorder Labs: Lab Results  Component Value Date   HGBA1C 6.4 02/28/2022   MPG 148.46 08/17/2019   MPG 134 (H) 03/03/2014   No results found for: "PROLACTIN" Lab Results  Component Value Date   CHOL 182 02/28/2022   TRIG 142 02/28/2022   HDL 49 02/28/2022   CHOLHDL 3.7 02/28/2022   VLDL 35.8 03/28/2021   LDLCALC 108 (H) 02/28/2022   LDLCALC 89 03/28/2021   Lab Results  Component Value Date   TSH 2.310 11/18/2021   TSH 0.944 01/04/2021    Therapeutic Level Labs: No results found for: "LITHIUM" No results found for: "VALPROATE" No results found for: "CBMZ"  Current Medications: Current Outpatient Medications  Medication Sig Dispense Refill   clonazePAM (KLONOPIN) 0.5 MG tablet Take 1 tablet (0.5 mg total) by mouth 2 (two) times daily as needed for anxiety. 60 tablet 0   hydrOXYzine (ATARAX) 10 MG tablet Take 1-2 tablets (10-20 mg total) by mouth 3 (three) times daily as needed. For anxiety 60 tablet 0   amLODipine (NORVASC) 10 MG tablet Take 1 tablet by mouth daily. 90 tablet 1   aspirin 81 MG EC tablet Take 1 tablet (81 mg total) by mouth daily. Swallow whole. 30 tablet 11   atorvastatin  (LIPITOR) 40 MG tablet Take 1 and 1/2 tablets by mouth daily. 45 tablet 6   Dulaglutide (TRULICITY) A999333 0000000 SOPN Inject 0.75 mg into the skin once a week. 2 mL 1   DULoxetine (CYMBALTA) 20 MG capsule Take 1 capsule (20 mg total) by mouth daily. 30 capsule 0   fenofibrate (TRICOR) 145 MG tablet Take 1 tablet (145 mg total) by mouth daily. 90 tablet 3   glucose blood test strip Check blood sugar once daily fasting or 2 hours after a meal 100 each 12   losartan (COZAAR) 100 MG tablet Take 1 tablet by mouth daily. 90 tablet 1   Magnesium 400 MG CAPS Take 400 mg by mouth daily.     omeprazole (PRILOSEC) 20 MG capsule Take 1 capsule (20 mg total) by mouth 2 (two) times daily before a meal. 180 capsule 1   Current Facility-Administered Medications  Medication Dose Route Frequency Provider Last Rate Last Admin   Fremanezumab-vfrm SOSY 225 mg  225 mg Subcutaneous Once Melvenia Beam, MD         Musculoskeletal: Strength & Muscle Tone: within normal limits Gait & Station: normal Patient leans: N/A  Psychiatric Specialty Exam: Review of Systems  Psychiatric/Behavioral:  Negative for decreased concentration, dysphoric mood, hallucinations, self-injury, sleep disturbance and suicidal ideas. The patient is nervous/anxious. The patient is not hyperactive.     There were no vitals taken for this visit.There is no height or weight on file to calculate BMI.  General Appearance: Casual  Eye Contact:  Good  Speech:  Clear and Coherent and Normal Rate  Volume:  Normal  Mood:  Anxious  Affect:  Congruent  Thought Process:  Coherent, Goal Directed, and Descriptions of Associations: Intact  Orientation:  Full (Time, Place, and Person)  Thought Content: WDL   Suicidal Thoughts:  No  Homicidal Thoughts:  No  Memory:  Immediate;   Good Recent;   Good Remote;   Good  Judgement:  Good  Insight:  Good  Psychomotor Activity:  Normal  Concentration:  Concentration: Good and Attention Span: Good   Recall:  Good  Fund of Knowledge: Good  Language: Good  Akathisia:  No  Handed:  Right  AIMS (if indicated): not done  Assets:  Communication Skills Desire for Improvement Housing Social Support  ADL's:  Intact  Cognition: WNL  Sleep:  Good   Screenings: GAD-7    Flowsheet Row Video Visit from 06/05/2022 in [facility] Video Visit from 05/09/2022 in [facility] Video Visit from 05/01/2022 in [facility] Office Visit from 04/24/2022 in [facility] Office Visit from 02/28/2022 in [place]  Total GAD-7 Score '16 9 16 14 1      '$ PHQ2-9    Flowsheet Row Video Visit from 06/05/2022 in [facility] Video Visit from 05/09/2022 in [facility] Video Visit from 05/01/2022 in [facility] Office Visit from 04/24/2022 in [facility] Office Visit from 02/28/2022 in [place]  PHQ-2 Total Score 1 0 '1 3 2  '$ PHQ-9 Total Score -- -- -- 11 5      Flowsheet Row Video Visit from 06/05/2022 in [facility] Video Visit from 05/09/2022 in [facility] Video Visit from 05/01/2022 in [place] No Risk No Risk No Risk        Assessment and Plan:   Cypress Tinnes "Juan Kline" is a 52 year old, Caucasian male with a past psychiatric history significant for generalized anxiety disorder and mild depression who presents to [facility] via virtual video visit for follow-up and medication management.  Patient presents today stating that Cymbalta has not been effective in managing his symptoms.  Patient has been on several anxiolytics and antidepressants for the management of his anxiety  but none have been successful in managing his symptoms.  Due to patient failing a number of medications used to manage his anxiety, provider to place patient on a low dose of clonazepam 0.5 mg 2 times daily as needed for the management of his anxiety.  Patient was agreeable to recommendation.  Patient's medication to be prescribed to pharmacy of choice.  Collaboration of Care: Collaboration of Care: Medication Management AEB provider managing patient's psychiatric medications, Primary Care Provider AEB patient being seen by her primary care provider, and Psychiatrist AEB patient being seen by a mental health provider at this facility  Patient/Guardian was advised Release of Information must be obtained prior to any record release in order to collaborate their care with an outside provider. Patient/Guardian was advised if they have not already done so to contact the registration department to sign all necessary forms in order for [name] to release information regarding their care.   Consent: Patient/Guardian gives verbal consent for treatment and assignment of benefits for services provided during this visit. Patient/Guardian expressed understanding and agreed to proceed.   1. GAD (generalized anxiety disorder) Past medication trials: Zoloft, Lexapro, Prozac, buspirone, hydroxyzine, gabapentin, venlafaxine, and Cymbalta.  While on past trial of medications, patient continued to experience anxiety as well as physical symptoms.  - clonazePAM (KLONOPIN) 0.5 MG tablet; Take 1 tablet (0.5 mg total) by mouth 2 (two) times daily as needed for anxiety.  Dispense: 60 tablet; Refill: 0  Patient to follow-up in 6 weeks Provider spent a total of 23 minutes with the patient/reviewing patient's chart  Malachy Mood, PA 06/05/2022, 6:09 PM

## 2022-06-06 ENCOUNTER — Ambulatory Visit: Payer: Self-pay | Admitting: Nurse Practitioner

## 2022-06-06 ENCOUNTER — Other Ambulatory Visit: Payer: Self-pay

## 2022-06-09 ENCOUNTER — Other Ambulatory Visit: Payer: Self-pay

## 2022-06-10 ENCOUNTER — Ambulatory Visit: Payer: Self-pay | Admitting: Nurse Practitioner

## 2022-06-11 ENCOUNTER — Telehealth (HOSPITAL_COMMUNITY): Payer: Self-pay | Admitting: Physician Assistant

## 2022-06-12 NOTE — Telephone Encounter (Signed)
Provider was able to reach out to patient.

## 2022-06-13 ENCOUNTER — Encounter: Payer: Self-pay | Admitting: Nurse Practitioner

## 2022-06-16 ENCOUNTER — Telehealth (HOSPITAL_COMMUNITY): Payer: Self-pay | Admitting: Physician Assistant

## 2022-06-16 ENCOUNTER — Other Ambulatory Visit: Payer: Self-pay

## 2022-06-17 ENCOUNTER — Ambulatory Visit: Payer: Medicaid Other | Attending: Nurse Practitioner | Admitting: Nurse Practitioner

## 2022-06-17 ENCOUNTER — Encounter: Payer: Self-pay | Admitting: Internal Medicine

## 2022-06-17 ENCOUNTER — Other Ambulatory Visit (HOSPITAL_COMMUNITY): Payer: Self-pay | Admitting: Physician Assistant

## 2022-06-17 ENCOUNTER — Encounter: Payer: Self-pay | Admitting: Nurse Practitioner

## 2022-06-17 ENCOUNTER — Other Ambulatory Visit: Payer: Self-pay

## 2022-06-17 VITALS — BP 121/70 | HR 65 | Ht 69.0 in | Wt 346.0 lb

## 2022-06-17 DIAGNOSIS — E1142 Type 2 diabetes mellitus with diabetic polyneuropathy: Secondary | ICD-10-CM | POA: Diagnosis not present

## 2022-06-17 DIAGNOSIS — I10 Essential (primary) hypertension: Secondary | ICD-10-CM | POA: Diagnosis not present

## 2022-06-17 DIAGNOSIS — J449 Chronic obstructive pulmonary disease, unspecified: Secondary | ICD-10-CM

## 2022-06-17 DIAGNOSIS — Z23 Encounter for immunization: Secondary | ICD-10-CM

## 2022-06-17 DIAGNOSIS — F321 Major depressive disorder, single episode, moderate: Secondary | ICD-10-CM | POA: Diagnosis not present

## 2022-06-17 DIAGNOSIS — D72829 Elevated white blood cell count, unspecified: Secondary | ICD-10-CM

## 2022-06-17 DIAGNOSIS — L97511 Non-pressure chronic ulcer of other part of right foot limited to breakdown of skin: Secondary | ICD-10-CM

## 2022-06-17 DIAGNOSIS — F411 Generalized anxiety disorder: Secondary | ICD-10-CM

## 2022-06-17 DIAGNOSIS — Z720 Tobacco use: Secondary | ICD-10-CM

## 2022-06-17 LAB — GLUCOSE, POCT (MANUAL RESULT ENTRY): POC Glucose: 136 mg/dl — AB (ref 70–99)

## 2022-06-17 MED ORDER — TRULICITY 0.75 MG/0.5ML ~~LOC~~ SOAJ
0.7500 mg | SUBCUTANEOUS | 1 refills | Status: DC
  Filled 2022-06-17: qty 2, 28d supply, fill #0
  Filled 2022-07-07 – 2022-07-09 (×2): qty 2, 28d supply, fill #1

## 2022-06-17 MED ORDER — ACCU-CHEK GUIDE W/DEVICE KIT
1.0000 | PACK | Freq: Once | 0 refills | Status: AC
  Filled 2022-06-17: qty 1, 1d supply, fill #0

## 2022-06-17 MED ORDER — ACCU-CHEK GUIDE VI STRP
ORAL_STRIP | 12 refills | Status: DC
  Filled 2022-06-17: qty 100, 25d supply, fill #0

## 2022-06-17 MED ORDER — ACCU-CHEK SOFTCLIX LANCETS MISC
12 refills | Status: DC
  Filled 2022-06-17: qty 100, 25d supply, fill #0

## 2022-06-17 MED ORDER — PROPRANOLOL HCL ER 60 MG PO CP24
60.0000 mg | ORAL_CAPSULE | Freq: Every day | ORAL | 1 refills | Status: DC
  Filled 2022-06-17: qty 30, 30d supply, fill #0

## 2022-06-17 NOTE — Progress Notes (Unsigned)
Assessment & Plan:  [name] was seen today for hypertension.  Diagnoses and all orders for this visit:  Primary hypertension -     CMP14+EGFR  Type 2 diabetes mellitus with diabetic polyneuropathy, without long-term current use of insulin (HCC) -     Dulaglutide (TRULICITY) A999333 0000000 SOPN; Inject 0.75 mg into the skin once a week. -     POCT glucose (manual entry) -     Hemoglobin A1c -     CMP14+EGFR -     Blood Glucose Monitoring Suppl (ACCU-CHEK GUIDE) w/Device KIT; Check blood glucose level by fingerstick once per day.  E11.65ly route once for 1 dose. -     glucose blood (ACCU-CHEK GUIDE) test strip; Use as instructed. Check blood glucose by fingerstick twice per day. -     Accu-Chek Softclix Lancets lancets; Check blood glucose level by fingerstick once per day.  E11.65  Right foot ulcer, limited to breakdown of skin ([place]) resolved  Major depressive disorder, single episode, moderate ([place]) Follow up with psychiatry   COPD mixed type ([place]) Stable  CT Chest pending   Tobacco use -     CT CHEST LUNG CA SCREEN LOW DOSE W/O CM; Future  Leukocytosis, unspecified type -     CBC with Differential  Need for shingles vaccine -     Varicella-zoster vaccine IM    Patient has been counseled on age-appropriate routine health concerns for screening and prevention. These are reviewed and up-to-date. Referrals have been placed accordingly. Immunizations are up-to-date or declined.    Subjective:   Chief Complaint  Patient presents with   Hypertension   HPI Juan Kline 52 y.o. male presents to office today for follow up to HTN  There appears to be an insurance issue regarding the clinic being OON. He will follow up with his insurance provider for further instructions   He has a past medical history of Aortic atherosclerosis, Chronic foot ulcer (admitted 03/02/2014), DM2, Diabetic foot infection, First degree AV block, GAD, GERD, migraines, Hyperlipidemia, Hypertension (Dx  2009), Mild depression, Osteomyelitis left first metatarsal (03/02/2014), Sleep apnea, and Venous stasis ulcer of left lower extremity    States he recently stopped taking Klonopin. Now only taking propranolol ER and being followed by behavioral health.   HTN Blood pressure is well controlled. He is currently prescribed  amlodipine 10 mg daily and losartan 100 mg daily.  BP Readings from Last 3 Encounters:  06/17/22 121/70  03/03/22 132/82  02/28/22 132/81     DM 2 Well controlled. He is currently taking Trulicity A999333 mg weekly  Lab Results  Component Value Date   HGBA1C 6.4 (H) 06/17/2022   Lab Results  Component Value Date   LDLCALC 108 (H) 02/28/2022     Review of Systems  Constitutional:  Negative for fever, malaise/fatigue and weight loss.  HENT: Negative.  Negative for nosebleeds.   Eyes: Negative.  Negative for blurred vision, double vision and photophobia.  Respiratory: Negative.  Negative for cough and shortness of breath.   Cardiovascular: Negative.  Negative for chest pain, palpitations and leg swelling.  Gastrointestinal: Negative.  Negative for heartburn, nausea and vomiting.  Musculoskeletal: Negative.  Negative for myalgias.  Neurological: Negative.  Negative for dizziness, focal weakness, seizures and headaches.  Psychiatric/Behavioral: Negative.  Negative for suicidal ideas.     Past Medical History:  Diagnosis Date   Aortic atherosclerosis ([place])    Chronic foot ulcer ([place]) admitted 03/02/2014   medial border first metatarsal left foot  -  resolved per patient 03/20/20   Diabetes ([place])    type 2 - no meds, diet controlled   Diabetic foot infection ([place])    First degree AV block    GAD (generalized anxiety disorder)    GERD (gastroesophageal reflux disease)    Hx of migraines    Hyperlipidemia    Hypertension Dx 2009   Mild depression    Osteomyelitis left first metatarsal 03/02/2014   Sleep apnea    not using CPAP- machine being looked at for repair  03/21/20    Venous stasis ulcer of left lower extremity ([place])    [name] 03/02/2014 - resolved per patient 03/20/20    Past Surgical History:  Procedure Laterality Date   BIOPSY  08/18/2019   Procedure: BIOPSY;  Surgeon: Thornton Park, MD;  [facility];  Service: Gastroenterology;;   ESOPHAGOGASTRODUODENOSCOPY (EGD) WITH PROPOFOL N/A 08/18/2019   Procedure: ESOPHAGOGASTRODUODENOSCOPY (EGD) WITH PROPOFOL;  Surgeon: Thornton Park, MD;  [facility];  Service: Gastroenterology;  Laterality: N/A;    Family History  Problem Relation Age of Onset   Hypertension Mother    Hyperlipidemia Mother    Diabetes Mother    Kidney disease Mother    Hypertension Father    Hyperlipidemia Father    Diabetes Brother    Hypertension Brother    Hyperlipidemia Brother    Heart disease Maternal Grandmother    COPD Maternal Grandmother    Diabetes Maternal Grandfather    Heart attack Paternal Grandmother    Heart disease Paternal Grandfather    Colon cancer Neg Hx    Colon polyps Neg Hx    Stomach cancer Neg Hx    Esophageal cancer Neg Hx    Ulcerative colitis Neg Hx     Social History Reviewed with no changes to be made today.   Outpatient Medications Prior to Visit  Medication Sig Dispense Refill   amLODipine (NORVASC) 10 MG tablet Take 1 tablet by mouth daily. 90 tablet 1   aspirin 81 MG EC tablet Take 1 tablet (81 mg total) by mouth daily. Swallow whole. 30 tablet 11   atorvastatin (LIPITOR) 40 MG tablet Take 1 and 1/2 tablets by mouth daily. 45 tablet 6   fenofibrate (TRICOR) 145 MG tablet Take 1 tablet (145 mg total) by mouth daily. 90 tablet 3   losartan (COZAAR) 100 MG tablet Take 1 tablet by mouth daily. 90 tablet 1   Magnesium 400 MG CAPS Take 400 mg by mouth daily.     omeprazole (PRILOSEC) 20 MG capsule Take 1 capsule (20 mg total) by mouth 2 (two) times daily before a meal. 180 capsule 1   propranolol ER (INDERAL LA) 60 MG 24 hr capsule Take 1 capsule (60  mg total) by mouth daily. 30 capsule 1   Dulaglutide (TRULICITY) A999333 0000000 SOPN Inject 0.75 mg into the skin once a week. 2 mL 1   glucose blood test strip Check blood sugar once daily fasting or 2 hours after a meal 100 each 12   clonazePAM (KLONOPIN) 0.5 MG tablet Take 1 tablet (0.5 mg total) by mouth 2 (two) times daily as needed for anxiety. 60 tablet 0   DULoxetine (CYMBALTA) 20 MG capsule Take 1 capsule (20 mg total) by mouth daily. 30 capsule 0   hydrOXYzine (ATARAX) 10 MG tablet Take 1-2 tablets (10-20 mg total) by mouth 3 (three) times daily as needed. For anxiety 60 tablet 0   Fremanezumab-vfrm SOSY 225 mg      No facility-administered  medications prior to visit.    Allergies  Allergen Reactions   Pregabalin Anxiety and Swelling       Objective:    BP 121/70   Pulse 65   Ht '5\' 9"'$  (1.753 m)   Wt (!) 346 lb (156.9 kg)   SpO2 98%   BMI 51.10 kg/m  Wt Readings from Last 3 Encounters:  06/17/22 (!) 346 lb (156.9 kg)  03/03/22 (!) 345 lb (156.5 kg)  02/28/22 (!) 343 lb 3.2 oz (155.7 kg)    Physical Exam Vitals and nursing note reviewed.  Constitutional:      Appearance: He is well-developed.  HENT:     Head: Normocephalic and atraumatic.  Cardiovascular:     Rate and Rhythm: Normal rate and regular rhythm.     Heart sounds: Normal heart sounds. No murmur heard.    No friction rub. No gallop.  Pulmonary:     Effort: Pulmonary effort is normal. No tachypnea or respiratory distress.     Breath sounds: Normal breath sounds. No decreased breath sounds, wheezing, rhonchi or rales.  Chest:     Chest wall: No tenderness.  Abdominal:     General: Bowel sounds are normal.     Palpations: Abdomen is soft.  Musculoskeletal:        General: Normal range of motion.     Cervical back: Normal range of motion.  Skin:    General: Skin is warm and dry.  Neurological:     Mental Status: He is alert and oriented to person, place, and time.     Coordination: Coordination  normal.  Psychiatric:        Behavior: Behavior normal. Behavior is cooperative.        Thought Content: Thought content normal.        Judgment: Judgment normal.          Patient has been counseled extensively about nutrition and exercise as well as the importance of adherence with medications and regular follow-up. The patient was given clear instructions to go to ER or return to medical center if symptoms don't improve, worsen or new problems develop. The patient verbalized understanding.   Follow-up: Return in 3 months (on 09/19/2022).   Gildardo Pounds, FNP-BC [facility], [place]   06/18/2022, 10:21 PM

## 2022-06-17 NOTE — Progress Notes (Unsigned)
Can't get Trulicity

## 2022-06-17 NOTE — Telephone Encounter (Signed)
Message acknowledged and reviewed.  Medication for propranolol (Inderal LA) 60 mg 24-hour capsule has been sent to pharmacy of choice.

## 2022-06-17 NOTE — Progress Notes (Signed)
Provider spoke with patient on Friday regarding other alternatives for management of anxiety.  Patient agreed to trying out propranolol for the management of his anxiety.  Medication for propranolol (Inderal LA) 60 mg 24-hour capsule has been sent to pharmacy of choice.

## 2022-06-17 NOTE — Patient Instructions (Signed)
[place] at [facility] Address: [address], [place], [place] [address] Phone: [phone]

## 2022-06-18 ENCOUNTER — Encounter: Payer: Self-pay | Admitting: Nurse Practitioner

## 2022-06-18 ENCOUNTER — Other Ambulatory Visit: Payer: Self-pay

## 2022-06-18 LAB — CBC WITH DIFFERENTIAL/PLATELET
Basophils Absolute: 0.1 10*3/uL (ref 0.0–0.2)
Basos: 1 %
EOS (ABSOLUTE): 0.1 10*3/uL (ref 0.0–0.4)
Eos: 1 %
Hematocrit: 42.2 % (ref 37.5–51.0)
Hemoglobin: 14.1 g/dL (ref 13.0–17.7)
Immature Grans (Abs): 0 10*3/uL (ref 0.0–0.1)
Immature Granulocytes: 0 %
Lymphocytes Absolute: 2.4 10*3/uL (ref 0.7–3.1)
Lymphs: 25 %
MCH: 28 pg (ref 26.6–33.0)
MCHC: 33.4 g/dL (ref 31.5–35.7)
MCV: 84 fL (ref 79–97)
Monocytes Absolute: 0.8 10*3/uL (ref 0.1–0.9)
Monocytes: 8 %
Neutrophils Absolute: 6.3 10*3/uL (ref 1.4–7.0)
Neutrophils: 65 %
Platelets: 356 10*3/uL (ref 150–450)
RBC: 5.04 x10E6/uL (ref 4.14–5.80)
RDW: 12.9 % (ref 11.6–15.4)
WBC: 9.7 10*3/uL (ref 3.4–10.8)

## 2022-06-18 LAB — CMP14+EGFR
ALT: 30 IU/L (ref 0–44)
AST: 28 IU/L (ref 0–40)
Albumin/Globulin Ratio: 2 (ref 1.2–2.2)
Albumin: 4.5 g/dL (ref 4.1–5.1)
Alkaline Phosphatase: 37 IU/L — ABNORMAL LOW (ref 44–121)
BUN/Creatinine Ratio: 21 — ABNORMAL HIGH (ref 9–20)
BUN: 18 mg/dL (ref 6–24)
Bilirubin Total: 0.3 mg/dL (ref 0.0–1.2)
CO2: 22 mmol/L (ref 20–29)
Calcium: 9.8 mg/dL (ref 8.7–10.2)
Chloride: 101 mmol/L (ref 96–106)
Creatinine, Ser: 0.87 mg/dL (ref 0.76–1.27)
Globulin, Total: 2.2 g/dL (ref 1.5–4.5)
Glucose: 105 mg/dL — ABNORMAL HIGH (ref 70–99)
Potassium: 4.5 mmol/L (ref 3.5–5.2)
Sodium: 140 mmol/L (ref 134–144)
Total Protein: 6.7 g/dL (ref 6.0–8.5)
eGFR: 105 mL/min/{1.73_m2} (ref >59)

## 2022-06-18 LAB — HEMOGLOBIN A1C
Est. average glucose Bld gHb Est-mCnc: 137 mg/dL
Hgb A1c MFr Bld: 6.4 % — ABNORMAL HIGH (ref 4.8–5.6)

## 2022-06-24 ENCOUNTER — Other Ambulatory Visit: Payer: Self-pay

## 2022-06-26 ENCOUNTER — Other Ambulatory Visit: Payer: Self-pay | Admitting: Nurse Practitioner

## 2022-06-26 ENCOUNTER — Other Ambulatory Visit: Payer: Self-pay

## 2022-06-26 ENCOUNTER — Other Ambulatory Visit (HOSPITAL_COMMUNITY): Payer: Self-pay

## 2022-06-26 DIAGNOSIS — E1142 Type 2 diabetes mellitus with diabetic polyneuropathy: Secondary | ICD-10-CM

## 2022-06-26 MED ORDER — ATORVASTATIN CALCIUM 40 MG PO TABS
60.0000 mg | ORAL_TABLET | Freq: Every day | ORAL | 6 refills | Status: DC
  Filled 2022-06-26: qty 45, 30d supply, fill #0
  Filled 2022-07-27: qty 45, 30d supply, fill #1
  Filled 2022-08-25: qty 45, 30d supply, fill #2

## 2022-06-30 ENCOUNTER — Telehealth: Payer: Self-pay

## 2022-06-30 NOTE — Telephone Encounter (Signed)
Pt contacted per answer to question from Dr. Lovena Le on 06/26/2022.  Pt called back today.    Pt states that he was put on Propranolol for PVC and anxiety.  Pt noticed, he felt off, not well physically, tired.  Pt stopped taking propranolol on his own 1 1/2 weeks ago.  Pt states his anxiety has increased, and is unsure what to do from this point moving forward.   After discussing the benefits of taking the propranolol in greater detail vs how he feels when taking it, a provider visit / assessment is needed.  Pt has tried other beta blockers, and have not worked;  Pt agreed to a EP App appointment to discuss medication options.  I will send a message to scheduling to set this up.

## 2022-06-30 NOTE — Telephone Encounter (Signed)
-----   Message from Varney Daily, RN sent at 06/19/2022 11:02 AM EST ----- Regarding: Propranolol vs PVC's question for Dr. Lovena Le reminder

## 2022-07-01 ENCOUNTER — Other Ambulatory Visit: Payer: Self-pay

## 2022-07-01 ENCOUNTER — Other Ambulatory Visit (HOSPITAL_COMMUNITY): Payer: Self-pay | Admitting: Physician Assistant

## 2022-07-01 DIAGNOSIS — F411 Generalized anxiety disorder: Secondary | ICD-10-CM

## 2022-07-01 MED ORDER — LORAZEPAM 0.5 MG PO TABS
0.2500 mg | ORAL_TABLET | Freq: Every day | ORAL | 0 refills | Status: DC
  Filled 2022-07-01: qty 15, 30d supply, fill #0

## 2022-07-01 NOTE — Progress Notes (Signed)
Patient was previously being prescribed the following medications for the management of his anxiety: Propranolol and clonazepam.  Patient was unable to continue taking propranolol due to already being on 2 different antihypertensive medications.  Patient also reported that clonazepam was ineffective in managing his anxiety. When taking clonazepam, patient experienced feeling more wired and did not feel any relief from his anxiety.  Patient has tried the following psychiatric medications for the management of his anxiety: Zoloft, buspirone, Lexapro, and hydroxyzine  Provider has tried the following psychiatric medications for the management of patient's anxiety: Prozac, venlafaxine, Cymbalta, clonazepam, and propranolol  Provider to place patient on Ativan 0.25 mg daily as needed for the management of his anxiety.  Before placing patient on medication, provider received verification that patient had returned to his unused clonazepam pills to the pharmacy. Provider was informed by April M. Procita, RPH and [employer]. Tamala Julian [facility] that patient returned his clonazepam on 07/01/2022.  Per Procita RPH, there were 53.5 tablets still left over in the bottle.  Patient's Ativan 0.25 mg daily as needed to be e-prescribed to pharmacy of choice.

## 2022-07-01 NOTE — Telephone Encounter (Signed)
Patient returned perscription to pharmacy, would like a call back before new perscription is called in

## 2022-07-03 ENCOUNTER — Other Ambulatory Visit (HOSPITAL_COMMUNITY): Payer: Self-pay | Admitting: Physician Assistant

## 2022-07-06 NOTE — Progress Notes (Unsigned)
Cardiology Office Note Date:  07/06/2022  Patient ID:  [name], DOB 01/22/1971, MRN [id number] PCP:  Gildardo Pounds, NP  Cardiologist; Dr. Debara Pickett Electrophysiologist: Dr. Lovena Le    Chief Complaint:  *** medication questions  History of Present Illness: Juan Kline is a 52 y.o. male with history of HTN, HLD, PVCs, OSA, COPD, DM, obesity, venous stasis ulcers, former ETOH/tobacco abuse.  Flecainide started 12//8/22 for PVCs BASELINE  PR interval about 377ms  He saw A. Duke, PA-C 04/16/21 f/u after flecainide, EKG done noted developed 1st degree AVblock of 306, in d/w Dr. Lovena Le, with no improvement and was stopped  He come sin today to be seen for Dr. Lovena Le, last seen by him March 2023, despite a low PVC burden of only 4%, he was symptomatic and had been started on Flecainide The patient had no improvement and self stopped the medication.  Subsequently felt better and maintained OFF drug unless his PVCs returned  Pt messaged 07/16/21 with recurrent PVCs and resumed his flecainide 50mg  BID EKG was done.  April a number of my chart discussions about his CPAP with pulm/PMD Saw his PMD for malaise 5/24, sounded chronic by , tinnitus, ?migraine Unable to see ENT with minimal health coverage Planned for labs Symptoms sounded chronic, advised further d/w pulmonary and cardiology Palpitations described as improved  Pt messaged 6/19 with intermittent increased burden of PVCs, inquired about up titrating dose.  I saw him 10/23/21 He is doing "OK" When he is having PVCs he is aware of them in his heart beat and they make him feel off when happening frequently In the last few weeks, he has been having them more frequently and persistently No fainting No CP He is using his CPAP machine, it has a recall and is waiting for the device company/his pulmonologist to get that squared away. Sometimes he has noticed that after he has been up and doing stuff that after he sits and relaxed he feels the  PVCs, this would be the only pattern, but not every time either. Planned to revisit his PVC burden  PVCs were rare <0.1% Had some Mobitz one, at 0700 One pt triggered event was SR, 1st degree 60's  Discussed with Dr. Lovena Le, with low PVC burden would not pursue any other or different management and not an ablation candidate with low/controlled PVC burden.  I saw him 11/20/21 Today he is feeling OK When he saw the NP a couple days ago he reports feeling pretty terrible, had been a bad day, HR felt all over the place and giving him weak spells/dizziness. His HR was elevated but BP was ok, and after a while sitting there did settle down. He has not fainted No CP, no SOB He feels like his HR gets fast and he feels weak/unsteady  Sometimes very weak that has to sit Given prolongation of PR and Mobitz one on monitor, flecainide stopped with plans to f/u early, monitor symptoms, +/- re-monitor perhaps loop  He saw Jonni Sanger 12/11/21, feeling better off flecainide though reported palpitations with exercise, trying to lose weight.  Discussed Mexiletine as a next option, though pt preferred to hold off on AAD.  Pt reached out recently seems recently advised to start proanolol for anxiety, wanted to make sure it was OK with [name], seems ultimately started though caused fatigue, generally unwell and stopped it   *** symptoms *** EKG *** brady?    Past Medical History:  Diagnosis Date   Aortic atherosclerosis ([place])  Chronic foot ulcer ([place]) admitted 03/02/2014   medial border first metatarsal left foot  - resolved per patient 03/20/20   Diabetes ([place])    type 2 - no meds, diet controlled   Diabetic foot infection ([place])    First degree AV block    GAD (generalized anxiety disorder)    GERD (gastroesophageal reflux disease)    Hx of migraines    Hyperlipidemia    Hypertension Dx 2009   Mild depression    Osteomyelitis left first metatarsal 03/02/2014   Sleep apnea    not using CPAP- machine being  looked at for repair 03/21/20    Venous stasis ulcer of left lower extremity ([place])    [name] 03/02/2014 - resolved per patient 03/20/20    Past Surgical History:  Procedure Laterality Date   BIOPSY  08/18/2019   Procedure: BIOPSY;  Surgeon: Thornton Park, MD;  Location: [facility];  Service: Gastroenterology;;   ESOPHAGOGASTRODUODENOSCOPY (EGD) WITH PROPOFOL N/A 08/18/2019   Procedure: ESOPHAGOGASTRODUODENOSCOPY (EGD) WITH PROPOFOL;  Surgeon: Thornton Park, MD;  Location: [facility];  Service: Gastroenterology;  Laterality: N/A;    Current Outpatient Medications  Medication Sig Dispense Refill   Accu-Chek Softclix Lancets lancets Check blood glucose level by fingerstick once per day.  E11.65 100 each 12   amLODipine (NORVASC) 10 MG tablet Take 1 tablet by mouth daily. 90 tablet 1   aspirin 81 MG EC tablet Take 1 tablet (81 mg total) by mouth daily. Swallow whole. 30 tablet 11   atorvastatin (LIPITOR) 40 MG tablet Take 1.5 tablets (60 mg total) by mouth daily. 45 tablet 6   Dulaglutide (TRULICITY) A999333 0000000 SOPN Inject 0.75 mg into the skin once a week. 2 mL 1   fenofibrate (TRICOR) 145 MG tablet Take 1 tablet (145 mg total) by mouth daily. 90 tablet 3   glucose blood (ACCU-CHEK GUIDE) test strip Use as instructed. Check blood glucose by fingerstick twice per day. 100 each 12   LORazepam (ATIVAN) 0.5 MG tablet Take 0.5 tablets (0.25 mg total) by mouth daily. 15 tablet 0   losartan (COZAAR) 100 MG tablet Take 1 tablet by mouth daily. 90 tablet 1   Magnesium 400 MG CAPS Take 400 mg by mouth daily.     omeprazole (PRILOSEC) 20 MG capsule Take 1 capsule (20 mg total) by mouth 2 (two) times daily before a meal. 180 capsule 1   propranolol ER (INDERAL LA) 60 MG 24 hr capsule Take 1 capsule (60 mg total) by mouth daily. 30 capsule 1   No current facility-administered medications for this visit.    Allergies:   Pregabalin   Social History:  The patient  reports that he quit  smoking about 8 years ago. His smoking use included e-cigarettes and cigarettes. He started smoking about 35 years ago. He has a 39.00 pack-year smoking history. He has never used smokeless tobacco. He reports that he does not currently use alcohol. He reports that he does not use drugs.   Family History:  The patient's family history includes COPD in his maternal grandmother; Diabetes in his brother, maternal grandfather, and mother; Heart attack in his paternal grandmother; Heart disease in his maternal grandmother and paternal grandfather; Hyperlipidemia in his brother, father, and mother; Hypertension in his brother, father, and mother; Kidney disease in his mother.  ROS:  Please see the history of present illness.    All other systems are reviewed and otherwise negative.   PHYSICAL EXAM:  VS:  There were no vitals  taken for this visit. BMI: There is no height or weight on file to calculate BMI. Well nourished, well developed, in no acute distress HEENT: normocephalic, atraumatic Neck: no JVD, carotid bruits or masses Cardiac:  *** RRR; *** no PVCs with prolonged auscultation, no significant murmurs, no rubs, or gallops Lungs:   *** CTA b/l, no wheezing, rhonchi or rales Abd: soft, nontender MS: no deformity or atrophy Ext:  *** no edema Skin: warm and dry, no rash Neuro:  No gross deficits appreciated Psych: euthymic mood, full affect   EKG:  Done today and reviewed by myself shows  ***  July 2023: monitor NSR with sinus brady and sinus tachycardia Marked first degree AV Block Rare periods of AVWB Rare PAC's and PVC's No Atrial fib or VT   04/26/2021: stress myoview   The study is normal. The study is low risk.   No ST deviation was noted.   LV perfusion is normal. There is no evidence of ischemia. There is no evidence of infarction.   Left ventricular function is normal. Nuclear stress EF: 55 %. The left ventricular ejection fraction is normal (55-65%). End diastolic cavity  size is normal.   Prior study not available for comparison.   Reduced apical counts with normal wall motion consistent with apical thinning artifact.  Normal study without ischemia or infarction.  Normal LVEF=55%.  This is a low-risk study.    01/04/21: TTE  1. Left ventricular ejection fraction, by estimation, is 60 to 65%. The  left ventricle has normal function. The left ventricle has no regional  wall motion abnormalities. There is mild concentric left ventricular  hypertrophy. Indeterminate diastolic  filling due to E-A fusion.   2. Right ventricular systolic function is low normal. The right  ventricular size is mildly enlarged. Tricuspid regurgitation signal is  inadequate for assessing PA pressure.   3. Left atrial size was mildly dilated.   4. Right atrial size was mildly dilated.   5. The mitral valve is grossly normal. No evidence of mitral valve  regurgitation. No evidence of mitral stenosis. Moderate mitral annular  calcification.   6. The aortic valve was not well visualized. Aortic valve regurgitation  is not visualized. No aortic stenosis is present.   7. The inferior vena cava is normal in size with greater than 50%  respiratory variability, suggesting right atrial pressure of 3 mmHg.   Comparison(s): Prior images reviewed side by side.   Conclusion(s)/Recommendation(s): Otherwise normal echocardiogram, with  minor abnormalities described in the report.   Recent Labs: 09/04/2021: Magnesium 1.8 11/18/2021: TSH 2.310 06/17/2022: ALT 30; BUN 18; Creatinine, Ser 0.87; Hemoglobin 14.1; Platelets 356; Potassium 4.5; Sodium 140  02/28/2022: Chol/HDL Ratio 3.7; Cholesterol, Total 182; HDL 49; LDL Chol Calc (NIH) 108; Triglycerides 142   CrCl cannot be calculated (Unknown ideal weight.).   Wt Readings from Last 3 Encounters:  06/17/22 (!) 346 lb (156.9 kg)  03/03/22 (!) 345 lb (156.5 kg)  02/28/22 (!) 343 lb 3.2 oz (155.7 kg)     Other studies reviewed: Additional  studies/records reviewed today include: summarized above  ASSESSMENT AND PLAN:  PVCs Baseline conduction system disease with 1st degree AVblock ***     Disposition: ***   Current medicines are reviewed at length with the patient today.  The patient did not have any concerns regarding medicines.  Venetia Night, PA-C 07/06/2022 12:00 PM     [address] [place] [address] [phone] (office)  [phone] (fax)

## 2022-07-07 ENCOUNTER — Other Ambulatory Visit (HOSPITAL_COMMUNITY): Payer: Self-pay

## 2022-07-09 ENCOUNTER — Other Ambulatory Visit: Payer: Self-pay

## 2022-07-09 ENCOUNTER — Ambulatory Visit: Payer: Medicaid Other | Attending: Physician Assistant | Admitting: Physician Assistant

## 2022-07-09 ENCOUNTER — Other Ambulatory Visit (HOSPITAL_COMMUNITY): Payer: Self-pay

## 2022-07-09 ENCOUNTER — Encounter: Payer: Self-pay | Admitting: Physician Assistant

## 2022-07-09 VITALS — BP 122/76 | HR 96 | Ht 69.0 in | Wt 339.4 lb

## 2022-07-09 DIAGNOSIS — I493 Ventricular premature depolarization: Secondary | ICD-10-CM | POA: Diagnosis not present

## 2022-07-09 MED ORDER — METOPROLOL TARTRATE 25 MG PO TABS
25.0000 mg | ORAL_TABLET | Freq: Two times a day (BID) | ORAL | 0 refills | Status: DC | PRN
  Filled 2022-07-09: qty 60, 30d supply, fill #0

## 2022-07-09 NOTE — Patient Instructions (Addendum)
Medication Instructions:    START TAKING:  LOPRESSOR 25 MG TWICE A DAY AS NEEDED FOR PALPITATIONS   *If you need a refill on your cardiac medications before your next appointment, please call your pharmacy*   Lab Work: NONE ORDERED  TODAY   If you have labs (blood work) drawn today and your tests are completely normal, you will receive your results only by: [place] (if you have MyChart) OR A paper copy in the mail If you have any lab test that is abnormal or we need to change your treatment, we will call you to review the results.   Testing/Procedures: NONE ORDERED  TODAY    Follow-Up: At [facility], you and your health needs are our priority.  As part of our continuing mission to provide you with exceptional heart care, we have created designated Provider Care Teams.  These Care Teams include your primary Cardiologist (physician) and Advanced Practice Providers (APPs -  Physician Assistants and Nurse Practitioners) who all work together to provide you with the care you need, when you need it.  We recommend signing up for the patient portal called "MyChart".  Sign up information is provided on this After Visit Summary.  MyChart is used to connect with patients for Virtual Visits (Telemedicine).  Patients are able to view lab/test results, encounter notes, upcoming appointments, etc.  Non-urgent messages can be sent to your provider as well.   To learn more about what you can do with MyChart, go to [email or web address].    Your next appointment:    2 month(s)  Provider:   Tommye Standard, PA-C    Other Instructions

## 2022-07-10 ENCOUNTER — Encounter: Payer: Self-pay | Admitting: Nurse Practitioner

## 2022-07-14 ENCOUNTER — Ambulatory Visit
Admission: RE | Admit: 2022-07-14 | Discharge: 2022-07-14 | Disposition: A | Discharge status: Home / Self Care | Payer: Medicaid Other | Source: Ambulatory Visit | Attending: Nurse Practitioner | Admitting: Nurse Practitioner

## 2022-07-14 DIAGNOSIS — Z720 Tobacco use: Secondary | ICD-10-CM

## 2022-07-16 ENCOUNTER — Other Ambulatory Visit (HOSPITAL_BASED_OUTPATIENT_CLINIC_OR_DEPARTMENT_OTHER): Payer: Medicaid Other

## 2022-07-17 ENCOUNTER — Telehealth (HOSPITAL_COMMUNITY): Payer: Medicaid Other | Admitting: Physician Assistant

## 2022-07-17 ENCOUNTER — Ambulatory Visit: Payer: Medicaid Other

## 2022-07-18 ENCOUNTER — Telehealth (INDEPENDENT_AMBULATORY_CARE_PROVIDER_SITE_OTHER): Payer: Medicaid Other | Admitting: Physician Assistant

## 2022-07-18 DIAGNOSIS — F411 Generalized anxiety disorder: Secondary | ICD-10-CM

## 2022-07-19 ENCOUNTER — Encounter (HOSPITAL_COMMUNITY): Payer: Self-pay | Admitting: Physician Assistant

## 2022-07-19 NOTE — Progress Notes (Unsigned)
BH MD/PA/NP OP Progress Note  Virtual Visit via Video Note  I connected with Juan Kline on 07/20/22 at  3:30 PM EDT by a video enabled telemedicine application and verified that I am speaking with the correct person using two identifiers.  Location: Patient: Home Provider: Clinic   I discussed the limitations of evaluation and management by telemedicine and the availability of in person appointments. The patient expressed understanding and agreed to proceed.  Follow Up Instructions:  I discussed the assessment and treatment plan with the patient. The patient was provided an opportunity to ask questions and all were answered. The patient agreed with the plan and demonstrated an understanding of the instructions.   The patient was advised to call back or seek an in-person evaluation if the symptoms worsen or if the condition fails to improve as anticipated.  I provided 33 minutes of non-face-to-face time during this encounter.  Meta Hatchet, PA    07/20/2022 1:46 PM Juan Kline  MRN:  [id number]  Chief Complaint:  Chief Complaint  Patient presents with   Follow-up   Medication Management   HPI:   Juan Kline "Juan Kline" is a 52 year old, Caucasian male with a past psychiatric history significant for generalized anxiety disorder and mild depression who presents to [facility] via virtual video visit for follow-up and medication management.  Patient is currently being managed on the following psychiatric medication:  Ativan 0.25 mg daily as needed  Patient reports that he has taken the medication for 2 weeks but did not experience much difference in the management of his anxiety.  Patient denies experiencing any positive change while taking Ativan and denies being calm or less tense.  Patient rates his anxiety on 9 or 10 out of 10.  Patient continues to experience mostly physical symptoms of anxiety.  Patient reports that he is anxious because he  is running out of medication options to help manage his anxiety.  He reports that he is starting to feel more hopeless.  Patient endorses having multiple stressors related to not working, bills he has to pay, and overdue car payments.  Patient reports that he was so stressed that he was unable to go to his medical appointments.  A PHQ-9 screen was performed the patient scored a 16.  A GAD-7 screen was also performed with the patient scoring a 19.  Patient is alert and oriented x 4, calm, cooperative, and fully engaged in conversation during the encounter.  Patient endorses low mood.  Patient denies suicidal or homicidal ideations.  He further denies auditory or visual hallucinations and does not appear to be responding to internal/external stimuli.  Patient endorses fair sleep and receives on average 7 hours of sleep per night.  Patient states that he often stays awake for a long period of time.  Patient endorses fair appetite and eats on average 2-3 meals per day.  Patient denies alcohol consumption, tobacco use, and illicit drug use.  Visit Diagnosis:    ICD-10-CM   1. GAD (generalized anxiety disorder)  F41.1 LORazepam (ATIVAN) 0.5 MG tablet    DISCONTINUED: LORazepam (ATIVAN) 0.5 MG tablet       Past Psychiatric History:  Generalized anxiety disorder Mild depression  Past Medical History:  Past Medical History:  Diagnosis Date   Aortic atherosclerosis    Chronic foot ulcer admitted 03/02/2014   medial border first metatarsal left foot  - resolved per patient 03/20/20   Diabetes    type 2 - no  meds, diet controlled   Diabetic foot infection    First degree AV block    GAD (generalized anxiety disorder)    GERD (gastroesophageal reflux disease)    Hx of migraines    Hyperlipidemia    Hypertension Dx 2009   Mild depression    Osteomyelitis left first metatarsal 03/02/2014   Sleep apnea    not using CPAP- machine being looked at for repair 03/21/20    Venous stasis ulcer of left  lower extremity    /notes 03/02/2014 - resolved per patient 03/20/20    Past Surgical History:  Procedure Laterality Date   BIOPSY  08/18/2019   Procedure: BIOPSY;  Surgeon: Tressia Danas, MD;  Location: [facility];  Service: Gastroenterology;;   ESOPHAGOGASTRODUODENOSCOPY (EGD) WITH PROPOFOL N/A 08/18/2019   Procedure: ESOPHAGOGASTRODUODENOSCOPY (EGD) WITH PROPOFOL;  Surgeon: Tressia Danas, MD;  Location: [facility];  Service: Gastroenterology;  Laterality: N/A;    Family Psychiatric History:  Patient reports that his mother was admitted for the management of her depression (possibly postpartum depression) shortly after his birth.  He reports that his mother was on Prozac in the past   Patient denies a past history of suicide attempt in his family Patient denies past history of homicide within his family Patient denies past history of substance abuse within his family  Family History:  Family History  Problem Relation Age of Onset   Hypertension Mother    Hyperlipidemia Mother    Diabetes Mother    Kidney disease Mother    Hypertension Father    Hyperlipidemia Father    Diabetes Brother    Hypertension Brother    Hyperlipidemia Brother    Heart disease Maternal Grandmother    COPD Maternal Grandmother    Diabetes Maternal Grandfather    Heart attack Paternal Grandmother    Heart disease Paternal Grandfather    Colon cancer Neg Hx    Colon polyps Neg Hx    Stomach cancer Neg Hx    Esophageal cancer Neg Hx    Ulcerative colitis Neg Hx     Social History:  Social History   Socioeconomic History   Marital status: Single    Spouse name: Not on file   Number of children: 0   Years of education: Not on file   Highest education level: Not on file  Occupational History   Not on file  Tobacco Use   Smoking status: Former    Packs/day: 1.50    Years: 26.00    Additional pack years: 0.00    Total pack years: 39.00    Types: E-cigarettes, Cigarettes    Start  date: 1989    Quit date: 02/16/2014    Years since quitting: 8.4   Smokeless tobacco: Never   Tobacco comments:    occasional vapes - no nicotine  Vaping Use   Vaping Use: Former   Start date: 04/15/2015   Quit date: 04/11/2019   Substances: Nicotine  Substance and Sexual Activity   Alcohol use: Not Currently    Comment: None 03/28/21, 03/02/2014 "I rarely drink"   Drug use: No   Sexual activity: Not Currently  Other Topics Concern   Not on file  Social History Narrative   Pt lives in 1 story home with his mother and step father   9th grade education   Last occupation was with [employer] in 2009      Unemployed at this time- thinking of filing for disability      Right handed  Social Determinants of Health   Financial Resource Strain: Not on file  Food Insecurity: Not on file  Transportation Needs: Not on file  Physical Activity: Not on file  Stress: Not on file  Social Connections: Not on file    Allergies:  Allergies  Allergen Reactions   Pregabalin Anxiety and Swelling    Metabolic Disorder Labs: Lab Results  Component Value Date   HGBA1C 6.4 (H) 06/17/2022   MPG 148.46 08/17/2019   MPG 134 (H) 03/03/2014   No results found for: "PROLACTIN" Lab Results  Component Value Date   CHOL 182 02/28/2022   TRIG 142 02/28/2022   HDL 49 02/28/2022   CHOLHDL 3.7 02/28/2022   VLDL 08.6 03/28/2021   LDLCALC 108 (H) 02/28/2022   LDLCALC 89 03/28/2021   Lab Results  Component Value Date   TSH 2.310 11/18/2021   TSH 0.944 01/04/2021    Therapeutic Level Labs: No results found for: "LITHIUM" No results found for: "VALPROATE" No results found for: "CBMZ"  Current Medications: Current Outpatient Medications  Medication Sig Dispense Refill   Accu-Chek Softclix Lancets lancets Check blood glucose level by fingerstick once per day.  E11.65 100 each 12   amLODipine (NORVASC) 10 MG tablet Take 1 tablet by mouth daily. 90 tablet 1   aspirin 81 MG EC tablet Take 1  tablet (81 mg total) by mouth daily. Swallow whole. 30 tablet 11   atorvastatin (LIPITOR) 40 MG tablet Take 1.5 tablets (60 mg total) by mouth daily. 45 tablet 6   Dulaglutide (TRULICITY) 0.75 MG/0.5ML SOPN Inject 0.75 mg into the skin once a week. 2 mL 1   fenofibrate (TRICOR) 145 MG tablet Take 1 tablet (145 mg total) by mouth daily. 90 tablet 3   glucose blood (ACCU-CHEK GUIDE) test strip Use as instructed. Check blood glucose by fingerstick twice per day. 100 each 12   [START ON 07/31/2022] LORazepam (ATIVAN) 0.5 MG tablet Take 1 tablet (0.5 mg total) by mouth 2 (two) times daily as needed for anxiety. 60 tablet 0   losartan (COZAAR) 100 MG tablet Take 1 tablet by mouth daily. 90 tablet 1   Magnesium 400 MG CAPS Take 400 mg by mouth daily.     metoprolol tartrate (LOPRESSOR) 25 MG tablet Take 1 tablet (25 mg total) by mouth 2 (two) times daily as needed (PALPATATIONS). 60 tablet 0   omeprazole (PRILOSEC) 20 MG capsule Take 1 capsule (20 mg total) by mouth 2 (two) times daily before a meal. 180 capsule 1   No current facility-administered medications for this visit.     Musculoskeletal: Strength & Muscle Tone: within normal limits Gait & Station: normal Patient leans: N/A  Psychiatric Specialty Exam: Review of Systems  Psychiatric/Behavioral:  Negative for decreased concentration, dysphoric mood, hallucinations, self-injury, sleep disturbance and suicidal ideas. The patient is nervous/anxious. The patient is not hyperactive.     There were no vitals taken for this visit.There is no height or weight on file to calculate BMI.  General Appearance: Casual  Eye Contact:  Good  Speech:  Clear and Coherent and Normal Rate  Volume:  Normal  Mood:  Anxious  Affect:  Congruent  Thought Process:  Coherent, Goal Directed, and Descriptions of Associations: Intact  Orientation:  Full (Time, Place, and Person)  Thought Content: WDL   Suicidal Thoughts:  No  Homicidal Thoughts:  No  Memory:   Immediate;   Good Recent;   Good Remote;   Good  Judgement:  Good  Insight:  Good  Psychomotor Activity:  Normal  Concentration:  Concentration: Good and Attention Span: Good  Recall:  Good  Fund of Knowledge: Good  Language: Good  Akathisia:  No  Handed:  Right  AIMS (if indicated): not done  Assets:  Communication Skills Desire for Improvement Housing Social Support  ADL's:  Intact  Cognition: WNL  Sleep:  Good   Screenings: GAD-7    Flowsheet Row [facility]  Total GAD-7 Score 19 7 16 9 16       PHQ2-9    Flowsheet Row [facility]  PHQ-2 Total Score 4 0 1 0 1  PHQ-9 Total Score 16 1 -- -- --      Flowsheet Row [facility]  C-SSRS RISK CATEGORY No Risk No Risk No Risk        Assessment and Plan:   Juan MillinGary Lorey "Juan PicketScott" is a 52 year old, Caucasian male with a past psychiatric history significant for generalized anxiety disorder and mild depression who [facility] virtual video visit for follow-up and medication management.  Patient  continues to experience anxiety not relieved from his current use of Ativan.  Patient reports that his anxiety is often accompanied with physical symptoms.  In addition to his anxiety, patient endorses multiple life stressors that have greatly been impacted by his elevated anxiety.  Provider recommended patient increase his Ativan from 0.25 mg to 0.5 mg 2 times daily as needed for the management of his anxiety.  Collaboration of Care: Collaboration of Care: Medication Management AEB provider managing patient's psychiatric medications, Primary Care Provider AEB patient being seen by her primary care provider, and Psychiatrist AEB patient being seen by a mental health provider at this facility  Patient/Guardian was advised Release of Information must be obtained prior to any record release in order to collaborate their care with an outside provider. Patient/Guardian was advised if they have not already done so to contact the registration department to sign all necessary forms in order for us to release information regarding their care.   Consent: Patient/Guardian gives verbal consent for treatment and assignment of benefits for services provided during this visit. Patient/Guardian expressed understanding and agreed to proceed.   1. GAD (generalized anxiety disorder)  - LORazepam (ATIVAN) 0.5 MG tablet; Take 1 tablet (0.5 mg total) by mouth 2 (two) times daily as needed for anxiety.  Dispense: 60 tablet; Refill: 0  Past medication trials: Zoloft, Lexapro, Prozac, buspirone, hydroxyzine, gabapentin, venlafaxine, Cymbalta, Clonazepan.  While on past trial of medications, patient continued to experience anxiety as well as physical symptoms.   Patient to follow-up in 6 weeks Provider spent a total of 33 minutes with the patient/reviewing patient's chart  Meta HatchetUchenna E Verlene Glantz, PA 07/20/2022, 1:46 PM

## 2022-07-20 MED ORDER — LORAZEPAM 0.5 MG PO TABS
0.5000 mg | ORAL_TABLET | Freq: Two times a day (BID) | ORAL | 0 refills | Status: DC | PRN
  Filled 2022-07-20: qty 60, 30d supply, fill #0

## 2022-07-20 MED ORDER — LORAZEPAM 0.5 MG PO TABS
0.5000 mg | ORAL_TABLET | Freq: Two times a day (BID) | ORAL | 0 refills | Status: DC | PRN
Start: 2022-07-31 — End: 2022-09-16
  Filled 2022-07-20 – 2022-08-04 (×3): qty 60, 30d supply, fill #0

## 2022-07-21 ENCOUNTER — Other Ambulatory Visit: Payer: Self-pay

## 2022-07-28 ENCOUNTER — Other Ambulatory Visit: Payer: Self-pay

## 2022-08-04 ENCOUNTER — Other Ambulatory Visit: Payer: Self-pay

## 2022-08-04 ENCOUNTER — Other Ambulatory Visit (HOSPITAL_COMMUNITY): Payer: Self-pay

## 2022-08-05 ENCOUNTER — Other Ambulatory Visit (HOSPITAL_COMMUNITY): Payer: Self-pay

## 2022-08-06 ENCOUNTER — Other Ambulatory Visit: Payer: Self-pay

## 2022-08-07 ENCOUNTER — Other Ambulatory Visit: Payer: Self-pay

## 2022-08-07 ENCOUNTER — Other Ambulatory Visit (HOSPITAL_COMMUNITY): Payer: Self-pay

## 2022-08-07 ENCOUNTER — Other Ambulatory Visit: Payer: Self-pay | Admitting: Nurse Practitioner

## 2022-08-07 DIAGNOSIS — E1142 Type 2 diabetes mellitus with diabetic polyneuropathy: Secondary | ICD-10-CM

## 2022-08-07 MED ORDER — TRULICITY 0.75 MG/0.5ML ~~LOC~~ SOAJ
0.7500 mg | SUBCUTANEOUS | 1 refills | Status: DC
Start: 2022-08-07 — End: 2022-09-16
  Filled 2022-08-07: qty 2, 28d supply, fill #0

## 2022-08-13 ENCOUNTER — Other Ambulatory Visit (HOSPITAL_COMMUNITY): Payer: Self-pay

## 2022-08-19 NOTE — Telephone Encounter (Signed)
Message acknowledged and reviewed.

## 2022-08-25 ENCOUNTER — Other Ambulatory Visit: Payer: Self-pay

## 2022-08-25 ENCOUNTER — Other Ambulatory Visit (HOSPITAL_COMMUNITY): Payer: Self-pay

## 2022-08-25 MED ORDER — MELOXICAM 15 MG PO TABS
15.0000 mg | ORAL_TABLET | Freq: Every day | ORAL | 0 refills | Status: DC
  Filled 2022-08-25: qty 30, 30d supply, fill #0

## 2022-08-27 ENCOUNTER — Encounter: Payer: Self-pay | Admitting: Nurse Practitioner

## 2022-08-28 ENCOUNTER — Encounter (HOSPITAL_COMMUNITY): Payer: Self-pay | Admitting: Physician Assistant

## 2022-08-28 ENCOUNTER — Telehealth (INDEPENDENT_AMBULATORY_CARE_PROVIDER_SITE_OTHER): Payer: Medicaid Other | Admitting: Physician Assistant

## 2022-08-28 DIAGNOSIS — F331 Major depressive disorder, recurrent, moderate: Secondary | ICD-10-CM | POA: Diagnosis not present

## 2022-08-28 DIAGNOSIS — F411 Generalized anxiety disorder: Secondary | ICD-10-CM

## 2022-08-28 NOTE — Progress Notes (Signed)
BH MD/PA/NP OP Progress Note  Virtual Visit via Video Note  I connected with Juan Kline on 08/28/22 at  1:00 PM EDT by a video enabled telemedicine application and verified that I am speaking with the correct person using two identifiers.  Location: Patient: Home Provider: Clinic   I discussed the limitations of evaluation and management by telemedicine and the availability of in person appointments. The patient expressed understanding and agreed to proceed.  Follow Up Instructions:  I discussed the assessment and treatment plan with the patient. The patient was provided an opportunity to ask questions and all were answered. The patient agreed with the plan and demonstrated an understanding of the instructions.   The patient was advised to call back or seek an in-person evaluation if the symptoms worsen or if the condition fails to improve as anticipated.  I provided 35 minutes of non-face-to-face time during this encounter.  Meta Hatchet, PA    08/28/2022 5:45 PM Juan Kline  MRN:  [id number]  Chief Complaint:  Chief Complaint  Patient presents with   Follow-up   Medication Management   HPI:   Juan Kline "Juan Kline" is a 52 year old, Caucasian male with a past psychiatric history significant for generalized anxiety disorder and mild depression who presents to [facility] via virtual video visit for follow-up and medication management.  Patient is currently being managed on the following psychiatric medication: Ativan 0.5 mg daily as needed.  Patient presents to the encounter stating that things are about the same when it comes to his medication management.  He reports that whenever he takes Ativan, he gets jittery as well as a fluish type of feeling.  He reports that the medication is minimally effective.  Patient rates his anxiety a 10 out of 10.  In regards to stressors, patient's biggest stressor is that he recently got denied disability.   Prior to his request for disability being denied, patient states that he was supposed to be set up with both a psych and health exam before determining his eligibility for disability.  Patient states that representative who was to conduct the appointments for his disability contacted him using a number that he does not use anymore, which resulted in the patient missing his appointment.  Patient also endorses living with his parents as a source of stress.  He reports that his parents are elderly and states that he has a past, he will have nowhere to go.  Due to the ordeals the patient is going to, he reports that he is slowly starting to lose as well and finds it difficult to be around people.  Patient reports that he is starting to experience depression due to the inability to manage his anxiety as well as his current stressors.  A PHQ-9 screen was performed with the patient scoring a 20.  A GAD-7 screen was also performed with the patient scoring a 19.  Patient is alert and oriented x 4, calm, cooperative, and fully engaged in conversation during the encounter.  Patient described his mood as hopeless.  Patient denies suicidal or homicidal ideations.  He further denies auditory or visual hallucinations and does not appear to be responding to internal/external stimuli.  Patient endorses fair sleep and receives on average 5 hours of sleep at night.  Patient endorses good appetite and eats on average 2-3 meals per day.  Patient denies alcohol consumption, tobacco use, and illicit drug use.  Visit Diagnosis:  No diagnosis found.  Past Psychiatric History:  Generalized anxiety disorder Mild depression  Past Medical History:  Past Medical History:  Diagnosis Date   Aortic atherosclerosis (HCC)    Chronic foot ulcer (HCC) admitted 03/02/2014   medial border first metatarsal left foot  - resolved per patient 03/20/20   Diabetes (HCC)    type 2 - no meds, diet controlled   Diabetic foot infection  (HCC)    First degree AV block    GAD (generalized anxiety disorder)    GERD (gastroesophageal reflux disease)    Hx of migraines    Hyperlipidemia    Hypertension Dx 2009   Mild depression    Osteomyelitis left first metatarsal 03/02/2014   Sleep apnea    not using CPAP- machine being looked at for repair 03/21/20    Venous stasis ulcer of left lower extremity (HCC)    [name] 03/02/2014 - resolved per patient 03/20/20    Past Surgical History:  Procedure Laterality Date   BIOPSY  08/18/2019   Procedure: BIOPSY;  Surgeon: Tressia Danas, MD;  Location: [facility];  Service: Gastroenterology;;   ESOPHAGOGASTRODUODENOSCOPY (EGD) WITH PROPOFOL N/A 08/18/2019   Procedure: ESOPHAGOGASTRODUODENOSCOPY (EGD) WITH PROPOFOL;  Surgeon: Tressia Danas, MD;  Location: [facility];  Service: Gastroenterology;  Laterality: N/A;    Family Psychiatric History:  Patient reports that his mother was admitted for the management of her depression (possibly postpartum depression) shortly after his birth.  He reports that his mother was on Prozac in the past   Patient denies a past history of suicide attempt in his family Patient denies past history of homicide within his family Patient denies past history of substance abuse within his family  Family History:  Family History  Problem Relation Age of Onset   Hypertension Mother    Hyperlipidemia Mother    Diabetes Mother    Kidney disease Mother    Hypertension Father    Hyperlipidemia Father    Diabetes Brother    Hypertension Brother    Hyperlipidemia Brother    Heart disease Maternal Grandmother    COPD Maternal Grandmother    Diabetes Maternal Grandfather    Heart attack Paternal Grandmother    Heart disease Paternal Grandfather    Colon cancer Neg Hx    Colon polyps Neg Hx    Stomach cancer Neg Hx    Esophageal cancer Neg Hx    Ulcerative colitis Neg Hx     Social History:  Social History   Socioeconomic History   Marital  status: Single    Spouse name: Not on file   Number of children: 0   Years of education: Not on file   Highest education level: Not on file  Occupational History   Not on file  Tobacco Use   Smoking status: Former    Packs/day: 1.50    Years: 26.00    Additional pack years: 0.00    Total pack years: 39.00    Types: E-cigarettes, Cigarettes    Start date: 1989    Quit date: 02/16/2014    Years since quitting: 8.5   Smokeless tobacco: Never   Tobacco comments:    occasional vapes - no nicotine  Vaping Use   Vaping Use: Former   Start date: 04/15/2015   Quit date: 04/11/2019   Substances: Nicotine  Substance and Sexual Activity   Alcohol use: Not Currently    Comment: None 03/28/21, 03/02/2014 "I rarely drink"   Drug use: No   Sexual activity: Not Currently  Other  Topics Concern   Not on file  Social History Narrative   Pt lives in 1 story home with his mother and step father   9th grade education   Last occupation was with [employer] in 2009      Unemployed at this time- thinking of filing for disability      Right handed      Social Determinants of Health   Financial Resource Strain: Not on file  Food Insecurity: Not on file  Transportation Needs: Not on file  Physical Activity: Not on file  Stress: Not on file  Social Connections: Not on file    Allergies:  Allergies  Allergen Reactions   Pregabalin Anxiety and Swelling    Metabolic Disorder Labs: Lab Results  Component Value Date   HGBA1C 6.4 (H) 06/17/2022   MPG 148.46 08/17/2019   MPG 134 (H) 03/03/2014   No results found for: "PROLACTIN" Lab Results  Component Value Date   CHOL 182 02/28/2022   TRIG 142 02/28/2022   HDL 49 02/28/2022   CHOLHDL 3.7 02/28/2022   VLDL 16.1 03/28/2021   LDLCALC 108 (H) 02/28/2022   LDLCALC 89 03/28/2021   Lab Results  Component Value Date   TSH 2.310 11/18/2021   TSH 0.944 01/04/2021    Therapeutic Level Labs: No results found for: "LITHIUM" No results  found for: "VALPROATE" No results found for: "CBMZ"  Current Medications: Current Outpatient Medications  Medication Sig Dispense Refill   Accu-Chek Softclix Lancets lancets Check blood glucose level by fingerstick once per day.  E11.65 100 each 12   amLODipine (NORVASC) 10 MG tablet Take 1 tablet by mouth daily. 90 tablet 1   aspirin 81 MG EC tablet Take 1 tablet (81 mg total) by mouth daily. Swallow whole. 30 tablet 11   atorvastatin (LIPITOR) 40 MG tablet Take 1.5 tablets (60 mg total) by mouth daily. 45 tablet 6   Dulaglutide (TRULICITY) 0.75 MG/0.5ML SOPN Inject 0.75 mg into the skin once a week. 2 mL 1   fenofibrate (TRICOR) 145 MG tablet Take 1 tablet (145 mg total) by mouth daily. 90 tablet 3   glucose blood (ACCU-CHEK GUIDE) test strip Use as instructed. Check blood glucose by fingerstick twice per day. 100 each 12   LORazepam (ATIVAN) 0.5 MG tablet Take 1 tablet (0.5 mg total) by mouth 2 (two) times daily as needed for anxiety. 60 tablet 0   losartan (COZAAR) 100 MG tablet Take 1 tablet by mouth daily. 90 tablet 1   Magnesium 400 MG CAPS Take 400 mg by mouth daily.     meloxicam (MOBIC) 15 MG tablet Take 1 tablet (15 mg total) by mouth daily. 30 tablet 0   metoprolol tartrate (LOPRESSOR) 25 MG tablet Take 1 tablet (25 mg total) by mouth 2 (two) times daily as needed (PALPATATIONS). 60 tablet 0   omeprazole (PRILOSEC) 20 MG capsule Take 1 capsule (20 mg total) by mouth 2 (two) times daily before a meal. 180 capsule 1   No current facility-administered medications for this visit.     Musculoskeletal: Strength & Muscle Tone: within normal limits Gait & Station: normal Patient leans: N/A  Psychiatric Specialty Exam: Review of Systems  Psychiatric/Behavioral:  Negative for decreased concentration, dysphoric mood, hallucinations, self-injury, sleep disturbance and suicidal ideas. The patient is nervous/anxious. The patient is not hyperactive.     There were no vitals taken for  this visit.There is no height or weight on file to calculate BMI.  General Appearance: Casual  Eye Contact:  Good  Speech:  Clear and Coherent and Normal Rate  Volume:  Normal  Mood:  Anxious  Affect:  Congruent  Thought Process:  Coherent, Goal Directed, and Descriptions of Associations: Intact  Orientation:  Full (Time, Place, and Person)  Thought Content: WDL   Suicidal Thoughts:  No  Homicidal Thoughts:  No  Memory:  Immediate;   Good Recent;   Good Remote;   Good  Judgement:  Good  Insight:  Good  Psychomotor Activity:  Normal  Concentration:  Concentration: Good and Attention Span: Good  Recall:  Good  Fund of Knowledge: Good  Language: Good  Akathisia:  No  Handed:  Right  AIMS (if indicated): not done  Assets:  Communication Skills Desire for Improvement Housing Social Support  ADL's:  Intact  Cognition: WNL  Sleep:  Good   Screenings: GAD-7    Flowsheet Row Video Visit from 08/28/2022 in [facility] Video Visit from 07/18/2022 in [facility] Office Visit from 06/17/2022 in [facility] Video Visit from 06/05/2022 in [facility] Video Visit from 05[facility]09/2022 in [facility]  Total GAD-7 Score 19 19 7 16 9       PHQ2-9    Flowsheet Row Video Visit from 08/28/2022 in [facility] Video Visit from 07/18/2022 in [facility] Office Visit from 06/17/2022 in [facility] Video Visit from 06/05/2022 in [facility] Video Visit from 05/09/2022 in [facility]  PHQ-2 Total Score 6 4 0 1 0  PHQ-9 Total Score 20 16 1  -- --      Flowsheet Row Video Visit from 08/28/2022 in [facility] Video Visit from 07/18/2022 in [facility] Video Visit from  06/05/2022 in [facility]  C-SSRS RISK CATEGORY No Risk No Risk No Risk        Assessment and Plan:   Zef Brusso "Juan Kline" is a 52 year old, Caucasian male with a past psychiatric history significant for generalized anxiety disorder and mild depression who presents to [facility] via virtual video visit for follow-up and medication management.  Patient continues to report that his use of Ativan is minimally effective in managing his anxiety.  He notes that he experiences minimal relief when using Ativan and states that he often experiences jitteriness and fluish like symptoms when taking the medication.  In addition to his anxiety, patient continues to endorse stressors related to being denied disability and still living with his parents.  Patient has been on numerous medications for management of anxiety but none of the options have been effective.  Provider recommended patient utilize Trintellix due to its ability to manage depressive symptoms and anxiety with few adverse side effects.  Patient was interested and trying of the medication.  Provider to place patient on Trintellix 5 mg for 7 days followed by Trintellix 10 mg for 14 more days.  Provider to reach out to patient prior to patient running out of his prescription to see how he is managing on the medication.  Patient was agreeable to starting the medication.  Provider to provide samples for the patient.  Provider allowed for a time at the end of the encounter to discuss potential adverse side effects to patient's current medication regimen.  Patient vocalized understanding.  Collaboration of  Care: Collaboration of Care: Medication Management AEB provider managing patient's psychiatric medications, Primary Care Provider AEB patient being seen by her primary care provider, and Psychiatrist AEB patient being seen by a mental health provider at this facility  Patient/Guardian was  advised Release of Information must be obtained prior to any record release in order to collaborate their care with an outside provider. Patient/Guardian was advised if they have not already done so to contact the registration department to sign all necessary forms in order for [name] to release information regarding their care.   Consent: Patient/Guardian gives verbal consent for treatment and assignment of benefits for services provided during this visit. Patient/Guardian expressed understanding and agreed to proceed.   1. GAD (generalized anxiety disorder) Provider to provide Trintellix samples for the patient following the conclusion of the encounter.  Patient to take Trintellix 5 mg for 7 days followed by Trintellix 10 mg for 14 more days for the management of his anxiety. Patient to continue Ativan 0.5 mg 3 times daily as needed for the management of anxiety  Past medication trials: Zoloft, Lexapro, Prozac, buspirone, hydroxyzine, gabapentin, venlafaxine, Cymbalta, Clonazepan.  While on past trial of medications, patient continued to experience anxiety as well as physical symptoms.   2. Moderate episode of recurrent major depressive disorder (HCC) Patient to take Trintellix 5 mg for 7 days, followed by Trintellix 10 mg for 14 days for the management of his depressive symptoms  Patient to follow-up in 6 weeks Provider spent a total of 35 minutes with the patient/reviewing patient's chart  Meta Hatchet, PA 08/28/2022, 5:45 PM

## 2022-08-28 NOTE — Progress Notes (Incomplete)
BH MD/PA/NP OP Progress Note  Virtual Visit via Video Note  I connected with Juan Kline on 08/28/22 at  1:00 PM EDT by a video enabled telemedicine application and verified that I am speaking with the correct person using two identifiers.  Location: Patient: Home Provider: Clinic   I discussed the limitations of evaluation and management by telemedicine and the availability of in person appointments. The patient expressed understanding and agreed to proceed.  Follow Up Instructions:  I discussed the assessment and treatment plan with the patient. The patient was provided an opportunity to ask questions and all were answered. The patient agreed with the plan and demonstrated an understanding of the instructions.   The patient was advised to call back or seek an in-person evaluation if the symptoms worsen or if the condition fails to improve as anticipated.  I provided 35 minutes of non-face-to-face time during this encounter.  Meta Hatchet, PA    08/28/2022 5:45 PM Juan Kline  MRN:  [id number]  Chief Complaint:  Chief Complaint  Patient presents with  . Follow-up  . Medication Management   HPI:   Juan Kline "[name]" is a 52 year old, Caucasian male with a past psychiatric history significant for generalized anxiety disorder and mild depression who presents to [facility] via virtual video visit for follow-up and medication management.  Patient is currently being managed on the following psychiatric medication: Ativan 0.5 mg daily as needed.  Patient presents today encounter stating that things are about the same when it comes to his medication management.  He reports that whenever he takes Ativan, he gets jittery as well as a fluish type of feeling.  He reports that the medication is minimally effective.  Patient rates his anxiety at 10 out of 10.  In regards to stressors, patient's biggest stressor is that he recently got denied disability.   Prior to his request for disability being denied, patient states that he was supposed to be set up with both a psych and health exam before determining his eligibility for disability.  Patient states that representative who was to set up an appointment for him contacted him on   Patient is alert and oriented x 4, calm, cooperative, and fully engaged in conversation during the encounter.  Visit Diagnosis:  No diagnosis found.    Past Psychiatric History:  Generalized anxiety disorder Mild depression  Past Medical History:  Past Medical History:  Diagnosis Date  . Aortic atherosclerosis (HCC)   . Chronic foot ulcer (HCC) admitted 03/02/2014   medial border first metatarsal left foot  - resolved per patient 03/20/20  . Diabetes (HCC)    type 2 - no meds, diet controlled  . Diabetic foot infection (HCC)   . First degree AV block   . GAD (generalized anxiety disorder)   . GERD (gastroesophageal reflux disease)   . Hx of migraines   . Hyperlipidemia   . Hypertension Dx 2009  . Mild depression   . Osteomyelitis left first metatarsal 03/02/2014  . Sleep apnea    not using CPAP- machine being looked at for repair 03/21/20   . Venous stasis ulcer of left lower extremity (HCC)    [name] 03/02/2014 - resolved per patient 03/20/20    Past Surgical History:  Procedure Laterality Date  . BIOPSY  08/18/2019   Procedure: BIOPSY;  Surgeon: Tressia Danas, MD;  Location: [facility];  Service: Gastroenterology;;  . ESOPHAGOGASTRODUODENOSCOPY (EGD) WITH PROPOFOL N/A 08/18/2019   Procedure: ESOPHAGOGASTRODUODENOSCOPY (EGD)  WITH PROPOFOL;  Surgeon: Tressia Danas, MD;  Location: [facility];  Service: Gastroenterology;  Laterality: N/A;    Family Psychiatric History:  Patient reports that his mother was admitted for the management of her depression (possibly postpartum depression) shortly after his birth.  He reports that his mother was on Prozac in the past   Patient denies a past  history of suicide attempt in his family Patient denies past history of homicide within his family Patient denies past history of substance abuse within his family  Family History:  Family History  Problem Relation Age of Onset  . Hypertension Mother   . Hyperlipidemia Mother   . Diabetes Mother   . Kidney disease Mother   . Hypertension Father   . Hyperlipidemia Father   . Diabetes Brother   . Hypertension Brother   . Hyperlipidemia Brother   . Heart disease Maternal Grandmother   . COPD Maternal Grandmother   . Diabetes Maternal Grandfather   . Heart attack Paternal Grandmother   . Heart disease Paternal Grandfather   . Colon cancer Neg Hx   . Colon polyps Neg Hx   . Stomach cancer Neg Hx   . Esophageal cancer Neg Hx   . Ulcerative colitis Neg Hx     Social History:  Social History   Socioeconomic History  . Marital status: Single    Spouse name: Not on file  . Number of children: 0  . Years of education: Not on file  . Highest education level: Not on file  Occupational History  . Not on file  Tobacco Use  . Smoking status: Former    Packs/day: 1.50    Years: 26.00    Additional pack years: 0.00    Total pack years: 39.00    Types: E-cigarettes, Cigarettes    Start date: 1989    Quit date: 02/16/2014    Years since quitting: 8.5  . Smokeless tobacco: Never  . Tobacco comments:    occasional vapes - no nicotine  Vaping Use  . Vaping Use: Former  . Start date: 04/15/2015  . Quit date: 04/11/2019  . Substances: Nicotine  Substance and Sexual Activity  . Alcohol use: Not Currently    Comment: None 03/28/21, 03/02/2014 "I rarely drink"  . Drug use: No  . Sexual activity: Not Currently  Other Topics Concern  . Not on file  Social History Narrative   Pt lives in 1 story home with his mother and step father   9th grade education   Last occupation was with [employer] in 2009      Unemployed at this time- thinking of filing for disability      Right handed       Social Determinants of Health   Financial Resource Strain: Not on file  Food Insecurity: Not on file  Transportation Needs: Not on file  Physical Activity: Not on file  Stress: Not on file  Social Connections: Not on file    Allergies:  Allergies  Allergen Reactions  . Pregabalin Anxiety and Swelling    Metabolic Disorder Labs: Lab Results  Component Value Date   HGBA1C 6.4 (H) 06/17/2022   MPG 148.46 08/17/2019   MPG 134 (H) 03/03/2014   No results found for: "PROLACTIN" Lab Results  Component Value Date   CHOL 182 02/28/2022   TRIG 142 02/28/2022   HDL 49 02/28/2022   CHOLHDL 3.7 02/28/2022   VLDL 16.1 03/28/2021   LDLCALC 108 (H) 02/28/2022   LDLCALC 89 03/28/2021  Lab Results  Component Value Date   TSH 2.310 11/18/2021   TSH 0.944 01/04/2021    Therapeutic Level Labs: No results found for: "LITHIUM" No results found for: "VALPROATE" No results found for: "CBMZ"  Current Medications: Current Outpatient Medications  Medication Sig Dispense Refill  . Accu-Chek Softclix Lancets lancets Check blood glucose level by fingerstick once per day.  E11.65 100 each 12  . amLODipine (NORVASC) 10 MG tablet Take 1 tablet by mouth daily. 90 tablet 1  . aspirin 81 MG EC tablet Take 1 tablet (81 mg total) by mouth daily. Swallow whole. 30 tablet 11  . atorvastatin (LIPITOR) 40 MG tablet Take 1.5 tablets (60 mg total) by mouth daily. 45 tablet 6  . Dulaglutide (TRULICITY) 0.75 MG/0.5ML SOPN Inject 0.75 mg into the skin once a week. 2 mL 1  . fenofibrate (TRICOR) 145 MG tablet Take 1 tablet (145 mg total) by mouth daily. 90 tablet 3  . glucose blood (ACCU-CHEK GUIDE) test strip Use as instructed. Check blood glucose by fingerstick twice per day. 100 each 12  . LORazepam (ATIVAN) 0.5 MG tablet Take 1 tablet (0.5 mg total) by mouth 2 (two) times daily as needed for anxiety. 60 tablet 0  . losartan (COZAAR) 100 MG tablet Take 1 tablet by mouth daily. 90 tablet 1  .  Magnesium 400 MG CAPS Take 400 mg by mouth daily.    . meloxicam (MOBIC) 15 MG tablet Take 1 tablet (15 mg total) by mouth daily. 30 tablet 0  . metoprolol tartrate (LOPRESSOR) 25 MG tablet Take 1 tablet (25 mg total) by mouth 2 (two) times daily as needed (PALPATATIONS). 60 tablet 0  . omeprazole (PRILOSEC) 20 MG capsule Take 1 capsule (20 mg total) by mouth 2 (two) times daily before a meal. 180 capsule 1   No current facility-administered medications for this visit.     Musculoskeletal: Strength & Muscle Tone: within normal limits Gait & Station: normal Patient leans: N/A  Psychiatric Specialty Exam: Review of Systems  Psychiatric/Behavioral:  Negative for decreased concentration, dysphoric mood, hallucinations, self-injury, sleep disturbance and suicidal ideas. The patient is nervous/anxious. The patient is not hyperactive.     There were no vitals taken for this visit.There is no height or weight on file to calculate BMI.  General Appearance: Casual  Eye Contact:  Good  Speech:  Clear and Coherent and Normal Rate  Volume:  Normal  Mood:  Anxious  Affect:  Congruent  Thought Process:  Coherent, Goal Directed, and Descriptions of Associations: Intact  Orientation:  Full (Time, Place, and Person)  Thought Content: WDL   Suicidal Thoughts:  No  Homicidal Thoughts:  No  Memory:  Immediate;   Good Recent;   Good Remote;   Good  Judgement:  Good  Insight:  Good  Psychomotor Activity:  Normal  Concentration:  Concentration: Good and Attention Span: Good  Recall:  Good  Fund of Knowledge: Good  Language: Good  Akathisia:  No  Handed:  Right  AIMS (if indicated): not done  Assets:  Communication Skills Desire for Improvement Housing Social Support  ADL's:  Intact  Cognition: WNL  Sleep:  Good   Screenings: GAD-7    Flowsheet Row Video Visit from 08/28/2022 in [facility] Video Visit from 07/18/2022 in [facility]  Office Visit from 06/17/2022 in [facility] Video Visit from 06/05/2022 in [facility] Video Visit from 05/09/2022 in  [facility]  Total GAD-7 Score 19 19 7 16 9       PHQ2-9    Flowsheet Row Video Visit from 08/28/2022 in [facility] Video Visit from 07/18/2022 in [facility] Office Visit from 06/17/2022 in [facility] Video Visit from 06/05/2022 in [facility] Video Visit from 05/09/2022 in [facility]  PHQ-2 Total Score 6 4 0 1 0  PHQ-9 Total Score 20 16 1  -- --      Flowsheet Row Video Visit from 08/28/2022 in [facility] Video Visit from 07/18/2022 in [facility] Video Visit from 06/05/2022 in [facility]  C-SSRS RISK CATEGORY No Risk No Risk No Risk        Assessment and Plan:   Jule Destefano "[name]" is a 52 year old, Caucasian male with a past psychiatric history significant for generalized anxiety disorder and mild depression who presents to [facility] via virtual video visit for follow-up and medication management. ***  Collaboration of Care: Collaboration of Care: Medication Management AEB provider managing patient's psychiatric medications, Primary Care Provider AEB patient being seen by her primary care provider, and Psychiatrist AEB patient being seen by a mental health provider at this facility  Patient/Guardian was advised Release of Information must be obtained prior to any record release in order to collaborate their care with an outside provider. Patient/Guardian was advised if they have not already done so to contact the registration department to sign all necessary forms in order for [name] to release information regarding their  care.   Consent: Patient/Guardian gives verbal consent for treatment and assignment of benefits for services provided during this visit. Patient/Guardian expressed understanding and agreed to proceed.   1. GAD (generalized anxiety disorder) ***  2. Moderate episode of recurrent major depressive disorder (HCC) ***  Past medication trials: Zoloft, Lexapro, Prozac, buspirone, hydroxyzine, gabapentin, venlafaxine, Cymbalta, Clonazepan.  While on past trial of medications, patient continued to experience anxiety as well as physical symptoms.   Patient to follow-up in 6 weeks Provider spent a total of *** minutes with the patient/reviewing patient's chart  Meta Hatchet, PA 08/28/2022, 5:45 PM

## 2022-09-01 ENCOUNTER — Other Ambulatory Visit: Payer: Self-pay | Admitting: Nurse Practitioner

## 2022-09-01 DIAGNOSIS — I10 Essential (primary) hypertension: Secondary | ICD-10-CM

## 2022-09-02 ENCOUNTER — Other Ambulatory Visit: Payer: Self-pay

## 2022-09-02 ENCOUNTER — Encounter: Payer: Self-pay | Admitting: Nurse Practitioner

## 2022-09-02 MED ORDER — AMLODIPINE BESYLATE 10 MG PO TABS
10.0000 mg | ORAL_TABLET | Freq: Every day | ORAL | 0 refills | Status: DC
Start: 2022-09-02 — End: 2022-09-16
  Filled 2022-09-02: qty 90, 90d supply, fill #0

## 2022-09-03 ENCOUNTER — Other Ambulatory Visit: Payer: Self-pay

## 2022-09-03 ENCOUNTER — Other Ambulatory Visit: Payer: Self-pay | Admitting: Nurse Practitioner

## 2022-09-03 DIAGNOSIS — E1142 Type 2 diabetes mellitus with diabetic polyneuropathy: Secondary | ICD-10-CM

## 2022-09-03 MED ORDER — ATORVASTATIN CALCIUM 40 MG PO TABS
60.0000 mg | ORAL_TABLET | Freq: Every day | ORAL | 1 refills | Status: DC
Start: 2022-09-03 — End: 2023-03-06
  Filled 2022-09-03 – 2022-09-21 (×2): qty 135, 90d supply, fill #0
  Filled 2022-12-20: qty 135, 90d supply, fill #1

## 2022-09-16 ENCOUNTER — Ambulatory Visit: Payer: Medicaid Other | Attending: Nurse Practitioner | Admitting: Nurse Practitioner

## 2022-09-16 ENCOUNTER — Other Ambulatory Visit (HOSPITAL_COMMUNITY): Payer: Self-pay

## 2022-09-16 ENCOUNTER — Other Ambulatory Visit: Payer: Self-pay

## 2022-09-16 ENCOUNTER — Encounter: Payer: Self-pay | Admitting: Nurse Practitioner

## 2022-09-16 VITALS — BP 109/73 | HR 100 | Ht 69.0 in | Wt 334.8 lb

## 2022-09-16 DIAGNOSIS — Z8042 Family history of malignant neoplasm of prostate: Secondary | ICD-10-CM

## 2022-09-16 DIAGNOSIS — I10 Essential (primary) hypertension: Secondary | ICD-10-CM | POA: Diagnosis not present

## 2022-09-16 DIAGNOSIS — R942 Abnormal results of pulmonary function studies: Secondary | ICD-10-CM

## 2022-09-16 DIAGNOSIS — Z7984 Long term (current) use of oral hypoglycemic drugs: Secondary | ICD-10-CM

## 2022-09-16 DIAGNOSIS — E7849 Other hyperlipidemia: Secondary | ICD-10-CM

## 2022-09-16 DIAGNOSIS — E1142 Type 2 diabetes mellitus with diabetic polyneuropathy: Secondary | ICD-10-CM

## 2022-09-16 DIAGNOSIS — F321 Major depressive disorder, single episode, moderate: Secondary | ICD-10-CM | POA: Diagnosis not present

## 2022-09-16 DIAGNOSIS — E559 Vitamin D deficiency, unspecified: Secondary | ICD-10-CM

## 2022-09-16 MED ORDER — AMLODIPINE BESYLATE 10 MG PO TABS
10.0000 mg | ORAL_TABLET | Freq: Every day | ORAL | 1 refills | Status: DC
Start: 2022-09-16 — End: 2023-03-06
  Filled 2022-09-16 – 2022-12-01 (×4): qty 90, 90d supply, fill #0
  Filled 2023-03-01: qty 90, 90d supply, fill #1

## 2022-09-16 MED ORDER — LOSARTAN POTASSIUM 100 MG PO TABS
100.0000 mg | ORAL_TABLET | Freq: Every day | ORAL | 1 refills | Status: DC
Start: 2022-09-16 — End: 2023-03-06
  Filled 2022-09-16 (×2): qty 90, 90d supply, fill #0
  Filled 2022-12-21: qty 90, 90d supply, fill #1

## 2022-09-16 MED ORDER — FENOFIBRATE 145 MG PO TABS
145.0000 mg | ORAL_TABLET | Freq: Every day | ORAL | 3 refills | Status: DC
Start: 2022-09-16 — End: 2023-03-06
  Filled 2022-09-16 – 2022-11-05 (×2): qty 90, 90d supply, fill #0
  Filled 2023-02-04: qty 90, 90d supply, fill #1

## 2022-09-16 NOTE — Progress Notes (Signed)
Assessment & Plan:  Juan Kline was seen today for hypertension.  Diagnoses and all orders for this visit:  Primary hypertension -     CMP14+EGFR -     amLODipine (NORVASC) 10 MG tablet; Take 1 tablet (10 mg total) by mouth daily. -     losartan (COZAAR) 100 MG tablet; Take 1 tablet (100 mg total) by mouth daily.  Type 2 diabetes mellitus with diabetic polyneuropathy, without long-term current use of insulin (HCC) -     Microalbumin / creatinine urine ratio -     CMP14+EGFR -     Hemoglobin A1c  Family history of prostate cancer in father -     PSA  Major depressive disorder, single episode, moderate (HCC) -     Ambulatory referral to Integrated Behavioral Health  Vitamin D deficiency disease -     VITAMIN D 25 Hydroxy (Vit-D Deficiency, Fractures)  Decreased functional residual capacity -     Ambulatory referral to Orthopedics  Other hyperlipidemia -     fenofibrate (TRICOR) 145 MG tablet; Take 1 tablet (145 mg total) by mouth daily.    Patient has been counseled on age-appropriate routine health concerns for screening and prevention. These are reviewed and up-to-date. Referrals have been placed accordingly. Immunizations are up-to-date or declined.    Subjective:   Chief Complaint  Patient presents with   Hypertension   HPI Juan Kline 52 y.o. male presents to office today for follow up to HTN.    He has a past medical history of Aortic atherosclerosis, Chronic foot ulcer (admitted 03/02/2014), DM2, Diabetic foot infection, First degree AV block, GAD, GERD, migraines, Hyperlipidemia, Hypertension (Dx 2009), Mild depression, Osteomyelitis left first metatarsal (03/02/2014), Sleep apnea, and Venous stasis ulcer of left lower extremity      We discussed his recent disability denial. He is working with a disability advocate. He states obtaining a letter from PCP might possibly help with his disability appeal. I have instructed him that this is not something we usually do as  disability has their own staff they use to make determinations. He states his previous 2 appointments were canceled by disability prior to his assessments being completed and then he received the denial. He is currently being followed by psychiatry for his mood disorder. I will have him referred for FCE to help determine his physical abilities.   He is requesting a referral to another behavioral health specialist. Has been experiencing increased anxiety. States his parents are elderly and he is uncertain about his future living accommodations if something were to happen to them. He is Hoping disability will be approved if it is appealed or in the near future.     HTN Blood pressure is well controlled. He is currently prescribed  amlodipine 10 mg daily and losartan 100 mg daily.  BP Readings from Last 3 Encounters:  09/16/22 109/73  07/09/22 122/76  06/17/22 121/70    DM 2 A1c pending. He could not afford Trulicity. He is not currently taking. Diabetic complications include: peripheral neuropathy.  Lab Results  Component Value Date   HGBA1C 6.4 (H) 06/17/2022  LDL not at goal with atorvastatin 60 mg daily and tricor 145 mg daily.  Lab Results  Component Value Date   LDLCALC 108 (H) 02/28/2022    Review of Systems  Constitutional:  Negative for fever, malaise/fatigue and weight loss.  HENT: Negative.  Negative for nosebleeds.   Eyes: Negative.  Negative for blurred vision, double vision and photophobia.  Respiratory: Negative.  Negative for cough and shortness of breath.   Cardiovascular: Negative.  Negative for chest pain, palpitations and leg swelling.  Gastrointestinal: Negative.  Negative for heartburn, nausea and vomiting.  Musculoskeletal: Negative.  Negative for myalgias.  Neurological:  Positive for sensory change. Negative for dizziness, focal weakness, seizures and headaches.  Psychiatric/Behavioral:  Positive for depression. Negative for suicidal ideas. The patient is  nervous/anxious.     Past Medical History:  Diagnosis Date   Aortic atherosclerosis (HCC)    Chronic foot ulcer (HCC) admitted 03/02/2014   medial border first metatarsal left foot  - resolved per patient 03/20/20   Diabetes (HCC)    type 2 - no meds, diet controlled   Diabetic foot infection (HCC)    First degree AV block    GAD (generalized anxiety disorder)    GERD (gastroesophageal reflux disease)    Hx of migraines    Hyperlipidemia    Hypertension Dx 2009   Mild depression    Osteomyelitis left first metatarsal 03/02/2014   Sleep apnea    not using CPAP- machine being looked at for repair 03/21/20    Venous stasis ulcer of left lower extremity (HCC)    [name] 03/02/2014 - resolved per patient 03/20/20    Past Surgical History:  Procedure Laterality Date   BIOPSY  08/18/2019   Procedure: BIOPSY;  Surgeon: Tressia Danas, MD;  Location: [facility];  Service: Gastroenterology;;   ESOPHAGOGASTRODUODENOSCOPY (EGD) WITH PROPOFOL N/A 08/18/2019   Procedure: ESOPHAGOGASTRODUODENOSCOPY (EGD) WITH PROPOFOL;  Surgeon: Tressia Danas, MD;  Location: [facility];  Service: Gastroenterology;  Laterality: N/A;    Family History  Problem Relation Age of Onset   Hypertension Mother    Hyperlipidemia Mother    Diabetes Mother    Kidney disease Mother    Hypertension Father    Hyperlipidemia Father    Diabetes Brother    Hypertension Brother    Hyperlipidemia Brother    Heart disease Maternal Grandmother    COPD Maternal Grandmother    Diabetes Maternal Grandfather    Heart attack Paternal Grandmother    Heart disease Paternal Grandfather    Colon cancer Neg Hx    Colon polyps Neg Hx    Stomach cancer Neg Hx    Esophageal cancer Neg Hx    Ulcerative colitis Neg Hx     Social History Reviewed with no changes to be made today.   Outpatient Medications Prior to Visit  Medication Sig Dispense Refill   Accu-Chek Softclix Lancets lancets Check blood glucose level by  fingerstick once per day.  E11.65 100 each 12   aspirin 81 MG EC tablet Take 1 tablet (81 mg total) by mouth daily. Swallow whole. 30 tablet 11   atorvastatin (LIPITOR) 40 MG tablet Take 1.5 tablets (60 mg total) by mouth daily. 135 tablet 1   glucose blood (ACCU-CHEK GUIDE) test strip Use as instructed. Check blood glucose by fingerstick twice per day. 100 each 12   Magnesium 400 MG CAPS Take 400 mg by mouth daily.     meloxicam (MOBIC) 15 MG tablet Take 1 tablet (15 mg total) by mouth daily. 30 tablet 0   metoprolol tartrate (LOPRESSOR) 25 MG tablet Take 1 tablet (25 mg total) by mouth 2 (two) times daily as needed (PALPATATIONS). 60 tablet 0   omeprazole (PRILOSEC) 20 MG capsule Take 1 capsule (20 mg total) by mouth 2 (two) times daily before a meal. 180 capsule 1   amLODipine (NORVASC) 10 MG tablet Take 1 tablet  by mouth daily. 90 tablet 0   fenofibrate (TRICOR) 145 MG tablet Take 1 tablet (145 mg total) by mouth daily. 90 tablet 3   losartan (COZAAR) 100 MG tablet Take 1 tablet by mouth daily. 90 tablet 1   Dulaglutide (TRULICITY) 0.75 MG/0.5ML SOPN Inject 0.75 mg into the skin once a week. 2 mL 1   LORazepam (ATIVAN) 0.5 MG tablet Take 1 tablet (0.5 mg total) by mouth 2 (two) times daily as needed for anxiety. 60 tablet 0   No facility-administered medications prior to visit.    Allergies  Allergen Reactions   Pregabalin Anxiety and Swelling       Objective:    BP 109/73 (BP Location: Left Arm, Patient Position: Sitting, Cuff Size: Large)   Pulse 100   Ht 5\' 9"  (1.753 m)   Wt (!) 334 lb 12.8 oz (151.9 kg)   SpO2 96%   BMI 49.44 kg/m  Wt Readings from Last 3 Encounters:  09/16/22 (!) 334 lb 12.8 oz (151.9 kg)  07/09/22 (!) 339 lb 6.4 oz (154 kg)  06/17/22 (!) 346 lb (156.9 kg)    Physical Exam Vitals and nursing note reviewed.  Constitutional:      Appearance: He is well-developed. He is obese.  HENT:     Head: Normocephalic and atraumatic.  Cardiovascular:     Rate  and Rhythm: Normal rate and regular rhythm.     Heart sounds: Normal heart sounds. No murmur heard.    No friction rub. No gallop.  Pulmonary:     Effort: Pulmonary effort is normal. No tachypnea or respiratory distress.     Breath sounds: Normal breath sounds. No decreased breath sounds, wheezing, rhonchi or rales.  Chest:     Chest wall: No tenderness.  Abdominal:     General: Bowel sounds are normal.     Palpations: Abdomen is soft.  Musculoskeletal:        General: Normal range of motion.     Cervical back: Normal range of motion.  Skin:    General: Skin is warm and dry.  Neurological:     Mental Status: He is alert and oriented to person, place, and time.     Coordination: Coordination normal.  Psychiatric:        Behavior: Behavior normal. Behavior is cooperative.        Thought Content: Thought content normal.        Judgment: Judgment normal.          Patient has been counseled extensively about nutrition and exercise as well as the importance of adherence with medications and regular follow-up. The patient was given clear instructions to go to ER or return to medical center if symptoms don't improve, worsen or new problems develop. The patient verbalized understanding.   Follow-up: Return in about 3 months (around 12/17/2022).   Claiborne Rigg, FNP-BC [facility] [place], [place] [phone]   09/16/2022, 6:34 PM

## 2022-09-17 LAB — CMP14+EGFR
ALT: 27 IU/L (ref 0–44)
AST: 42 IU/L — ABNORMAL HIGH (ref 0–40)
Albumin/Globulin Ratio: 1.5 (ref 1.2–2.2)
Albumin: 4.5 g/dL (ref 4.1–5.1)
Alkaline Phosphatase: 38 IU/L — ABNORMAL LOW (ref 44–121)
BUN/Creatinine Ratio: 23 — ABNORMAL HIGH (ref 9–20)
BUN: 28 mg/dL — ABNORMAL HIGH (ref 6–24)
Bilirubin Total: 0.4 mg/dL (ref 0.0–1.2)
CO2: 19 mmol/L — ABNORMAL LOW (ref 20–29)
Calcium: 9.9 mg/dL (ref 8.7–10.2)
Chloride: 102 mmol/L (ref 96–106)
Creatinine, Ser: 1.22 mg/dL (ref 0.76–1.27)
Globulin, Total: 3 g/dL (ref 1.5–4.5)
Glucose: 124 mg/dL — ABNORMAL HIGH (ref 70–99)
Potassium: 5.1 mmol/L (ref 3.5–5.2)
Sodium: 138 mmol/L (ref 134–144)
Total Protein: 7.5 g/dL (ref 6.0–8.5)
eGFR: 72 mL/min/{1.73_m2} (ref >59)

## 2022-09-17 LAB — PSA: Prostate Specific Ag, Serum: 1.2 ng/mL (ref 0.0–4.0)

## 2022-09-17 LAB — HEMOGLOBIN A1C
Est. average glucose Bld gHb Est-mCnc: 143 mg/dL
Hgb A1c MFr Bld: 6.6 % — ABNORMAL HIGH (ref 4.8–5.6)

## 2022-09-17 LAB — VITAMIN D 25 HYDROXY (VIT D DEFICIENCY, FRACTURES): Vit D, 25-Hydroxy: 29.8 ng/mL — ABNORMAL LOW (ref 30.0–100.0)

## 2022-09-18 ENCOUNTER — Encounter: Payer: Self-pay | Admitting: Nurse Practitioner

## 2022-09-18 ENCOUNTER — Ambulatory Visit: Payer: Medicaid Other | Admitting: Physician Assistant

## 2022-09-18 ENCOUNTER — Other Ambulatory Visit: Payer: Self-pay | Admitting: Nurse Practitioner

## 2022-09-18 DIAGNOSIS — N481 Balanitis: Secondary | ICD-10-CM

## 2022-09-22 ENCOUNTER — Encounter: Payer: Self-pay | Admitting: Nurse Practitioner

## 2022-09-22 ENCOUNTER — Other Ambulatory Visit: Payer: Self-pay

## 2022-09-23 ENCOUNTER — Ambulatory Visit (HOSPITAL_BASED_OUTPATIENT_CLINIC_OR_DEPARTMENT_OTHER): Payer: Medicaid Other | Attending: Nurse Practitioner

## 2022-09-23 DIAGNOSIS — G4733 Obstructive sleep apnea (adult) (pediatric): Secondary | ICD-10-CM

## 2022-09-26 ENCOUNTER — Encounter: Payer: Self-pay | Admitting: Nurse Practitioner

## 2022-10-08 ENCOUNTER — Emergency Department (HOSPITAL_BASED_OUTPATIENT_CLINIC_OR_DEPARTMENT_OTHER): Payer: Medicaid Other

## 2022-10-08 ENCOUNTER — Encounter (HOSPITAL_BASED_OUTPATIENT_CLINIC_OR_DEPARTMENT_OTHER): Payer: Self-pay

## 2022-10-08 ENCOUNTER — Emergency Department (HOSPITAL_BASED_OUTPATIENT_CLINIC_OR_DEPARTMENT_OTHER)
Admission: EM | Admit: 2022-10-08 | Discharge: 2022-10-08 | Disposition: A | Discharge status: Home / Self Care | Payer: Medicaid Other | Attending: Emergency Medicine | Admitting: Emergency Medicine

## 2022-10-08 ENCOUNTER — Other Ambulatory Visit: Payer: Self-pay

## 2022-10-08 DIAGNOSIS — R002 Palpitations: Secondary | ICD-10-CM | POA: Diagnosis not present

## 2022-10-08 DIAGNOSIS — F419 Anxiety disorder, unspecified: Secondary | ICD-10-CM | POA: Diagnosis not present

## 2022-10-08 DIAGNOSIS — R200 Anesthesia of skin: Secondary | ICD-10-CM | POA: Insufficient documentation

## 2022-10-08 DIAGNOSIS — Z7982 Long term (current) use of aspirin: Secondary | ICD-10-CM | POA: Insufficient documentation

## 2022-10-08 DIAGNOSIS — I10 Essential (primary) hypertension: Secondary | ICD-10-CM | POA: Diagnosis not present

## 2022-10-08 DIAGNOSIS — Z79899 Other long term (current) drug therapy: Secondary | ICD-10-CM | POA: Diagnosis not present

## 2022-10-08 DIAGNOSIS — R0602 Shortness of breath: Secondary | ICD-10-CM | POA: Insufficient documentation

## 2022-10-08 NOTE — ED Triage Notes (Addendum)
Patient arrives with complaints of anxiety, palpitations, intermittent facial numbness, and insomnia x1 week.  Also reports fatigue. No pain.   ---took a small dose of ativan prior to arrival with no relief.

## 2022-10-08 NOTE — Discharge Instructions (Addendum)
Your chest xray did not show any abnormal findings.  Please follow up with your primary care physician as needed.

## 2022-10-08 NOTE — ED Provider Notes (Signed)
[facility] AT [facility] Provider Note   CSN: [id number] Arrival date & time: 10/08/22  1304     History HT, GAD Chief Complaint  Patient presents with   Anxiety   Palpitations    Juan Kline is a 52 y.o. male.  52 y.o male with a PMH HTN, GAD, mild depression presents to the ED with a chief complaint of intermittent episodes of facial numbness, hand numbness and palpitations which have been ongoing for years. Patient reports last night he took his BP, this was high and he reports he was not doing anything differently, he states he has tried to eat better. He reports "anxiety has ruined my life". He does have a virtual visit with psychiatry on Friday but states he needs to be "admitted for panic attacks".  Patient also reports having some Ativan left at his house, took this around noon which did help with his symptoms.  He reports having a prior provider which was helping him with his anxiety, however he "lost his trust ", has not been followed by anybody else.  He does endorse feeling short of breath, this happens at all times, no exacerbating symptoms.  He feels like he is very irritable as he is not getting any sleep, he is sensitive to sound and lights but this has been ongoing for years.  He recently quit alcohol use thinking this would help with his symptoms there has not been any improvement.  He denies any chest pain, leg swelling, orthopnea.   The history is provided by the patient.  Anxiety This is a recurrent problem. The current episode started more than 1 week ago. The problem occurs constantly. The problem has been gradually improving. Pertinent negatives include no chest pain, no abdominal pain and no shortness of breath. Nothing aggravates the symptoms. Nothing relieves the symptoms.  Palpitations Associated symptoms: no chest pain, no nausea, no shortness of breath and no vomiting        Home Medications Prior to Admission medications    Medication Sig Start Date End Date Taking? Authorizing Provider  Accu-Chek Softclix Lancets lancets Check blood glucose level by fingerstick once per day.  E11.65 06/17/22   Juan Rigg, NP  amLODipine (NORVASC) 10 MG tablet Take 1 tablet (10 mg total) by mouth daily. 09/16/22   [facility] Rigg, NP  aspirin 81 MG EC tablet Take 1 tablet (81 mg total) by mouth daily. Swallow whole. 01/05/21   Dorcas Carrow, MD  atorvastatin (LIPITOR) 40 MG tablet Take 1.5 tablets (60 mg total) by mouth daily. 09/03/22   [facility]uan Rigg, NP  fenofibrate (TRICOR) 145 MG tablet Take 1 tablet (145 mg total) by mouth daily. 09/16/22   Juan Rigg, NP  glucose blood (ACCU-CHEK GUIDE) test strip Use as instructed. Check blood glucose by fingerstick twice per day. 06/17/22   Juan Rigg, NP  losartan (COZAAR) 100 MG tablet Take 1 tablet (100 mg total) by mouth daily. 09/16/22   Juan Rigg, NP  Magnesium 400 MG CAPS Take 400 mg by mouth daily.    [provider]  meloxicam (MOBIC) 15 MG tablet Take 1 tablet (15 mg total) by mouth daily. 08/24/22     metoprolol tartrate (LOPRESSOR) 25 MG tablet Take 1 tablet (25 mg total) by mouth 2 (two) times daily as needed (PALPATATIONS). 07/09/22   Sheilah Pigeon, PA-C  omeprazole (PRILOSEC) 20 MG capsule Take 1 capsule (20 mg total) by mouth 2 (two) times daily  before a meal. 02/28/22   [facility] Rigg, NP      Allergies    Pregabalin    Review of Systems   Review of Systems  Constitutional:  Negative for chills and fever.  HENT:  Negative for sore throat.   Respiratory:  Negative for shortness of breath.   Cardiovascular:  Positive for palpitations. Negative for chest pain.  Gastrointestinal:  Negative for abdominal pain, diarrhea, nausea and vomiting.  Genitourinary:  Negative for flank pain.  All other systems reviewed and are negative.   Physical Exam Updated Vital Signs BP (!) 113/90 (BP Location: Right Arm)   Pulse (!) 110   Temp 98 F (36.7  C) (Oral)   Resp 20   Ht 5\' 9"  (1.753 m)   Wt (!) 151.5 kg   SpO2 96%   BMI 49.32 kg/m  Physical Exam Vitals and nursing note reviewed.  Constitutional:      Appearance: Normal appearance.  HENT:     Head: Normocephalic and atraumatic.     Mouth/Throat:     Mouth: Mucous membranes are moist.  Eyes:     Pupils: Pupils are equal, round, and reactive to light.  Cardiovascular:     Rate and Rhythm: Normal rate.  Pulmonary:     Effort: Pulmonary effort is normal.     Breath sounds: No wheezing or rales.  Abdominal:     General: Abdomen is flat.     Palpations: Abdomen is soft.     Tenderness: There is no abdominal tenderness.  Musculoskeletal:     Cervical back: Normal range of motion and neck supple.     Right lower leg: No edema.     Left lower leg: No edema.  Skin:    General: Skin is warm and dry.  Neurological:     Mental Status: He is alert and oriented to person, place, and time.     ED Results / Procedures / Treatments   Labs (all labs ordered are listed, but only abnormal results are displayed) Labs Reviewed - No data to display  EKG None  Radiology DG Chest Portable 1 View  Result Date: 10/08/2022 CLINICAL DATA:  Shortness of breath, palpitations EXAM: PORTABLE CHEST 1 VIEW COMPARISON:  01/03/2021 FINDINGS: Cardiac and mediastinal contours are within normal limits given AP technique. Aortic atherosclerosis. Redemonstrated eventration of the right hemidiaphragm. Bibasilar atelectasis. No new focal pulmonary opacity. No pleural effusion or pneumothorax. No acute osseous abnormality. IMPRESSION: No acute cardiopulmonary process. Electronically Signed   By: Wiliam Ke M.D.   On: 10/08/2022 14:51    Procedures Procedures    Medications Ordered in ED Medications - No data to display  ED Course/ Medical Decision Making/ A&P                             Medical Decision Making Amount and/or Complexity of Data Reviewed Radiology: ordered.   Patient  presents to the ED with a chief complaint of shortness of breath, facial numbness, hand numbness, tingling to his hands intermittent episodes that have been ongoing for the past years, however worsened over the past week.  Has an appointment with psychiatry scheduled for Friday for further management.  Currently not taking any medication for his mental health and anxiety.  He does have a prior prescription for Ativan which she tried taking around noon, feels that this is likely panic attack with some resolution in symptoms after intervention.  Arrived to  the ED normotensive, heart rate was slightly elevated at 110, he is afebrile and satting at 96% on room air.  During our evaluation, he is overall nontoxic-appearing, appears in no distress at this time, his vitals have continued to be stable.  His lungs are clear to auscultation, chest x-ray was ordered to rule out any infectious process versus acute process contributing to his shortness of breath.  EKG obtained on arrival concerning for irregularly irregular rhythm, however repeat EKG looks reassuring with his unknown first-degree block which he has had a previous history of.  Chest x-ray did not show any acute findings.  Vitals are otherwise within normal limits, do feel that patient without any chest pain, normal EKG, normal chest x-ray needs further workup for his GAD.  He is not having any suicidal ideation, no homicidal Nations, no visual or auditory hallucinations I do not feel that he meets criteria for TTS consultation at this time.  We discussed appropriate follow-up at his scheduled appointment with psychiatry on Friday.  Patient is hemodynamically stable for discharge.   Portions of this note were generated with Scientist, clinical (histocompatibility and immunogenetics). Dictation errors may occur despite best attempts at proofreading.   Final Clinical Impression(s) / ED Diagnoses Final diagnoses:  Shortness of breath    Rx / DC Orders ED Discharge Orders     None          Claude Manges, PA-C 10/08/22 1518    Tegeler, Canary Brim, MD 10/08/22 1527

## 2022-10-09 ENCOUNTER — Encounter: Payer: Self-pay | Admitting: Nurse Practitioner

## 2022-10-09 ENCOUNTER — Telehealth (HOSPITAL_COMMUNITY): Payer: Medicaid Other | Admitting: Physician Assistant

## 2022-10-09 DIAGNOSIS — R942 Abnormal results of pulmonary function studies: Secondary | ICD-10-CM

## 2022-10-10 ENCOUNTER — Other Ambulatory Visit: Payer: Self-pay

## 2022-10-10 ENCOUNTER — Other Ambulatory Visit (HOSPITAL_COMMUNITY): Payer: Self-pay

## 2022-10-10 MED ORDER — ESCITALOPRAM OXALATE 10 MG PO TABS
ORAL_TABLET | ORAL | 0 refills | Status: DC
  Filled 2022-10-10 (×2): qty 30, 33d supply, fill #0

## 2022-10-14 ENCOUNTER — Emergency Department (HOSPITAL_BASED_OUTPATIENT_CLINIC_OR_DEPARTMENT_OTHER): Payer: Medicaid Other | Admitting: Radiology

## 2022-10-14 ENCOUNTER — Emergency Department (HOSPITAL_BASED_OUTPATIENT_CLINIC_OR_DEPARTMENT_OTHER)
Admission: EM | Admit: 2022-10-14 | Discharge: 2022-10-14 | Disposition: A | Discharge status: Home / Self Care | Payer: Medicaid Other | Attending: Emergency Medicine | Admitting: Emergency Medicine

## 2022-10-14 ENCOUNTER — Encounter (HOSPITAL_BASED_OUTPATIENT_CLINIC_OR_DEPARTMENT_OTHER): Payer: Self-pay | Admitting: Internal Medicine

## 2022-10-14 ENCOUNTER — Encounter (HOSPITAL_BASED_OUTPATIENT_CLINIC_OR_DEPARTMENT_OTHER): Payer: Self-pay

## 2022-10-14 ENCOUNTER — Other Ambulatory Visit: Payer: Self-pay

## 2022-10-14 ENCOUNTER — Other Ambulatory Visit: Payer: Self-pay | Admitting: Nurse Practitioner

## 2022-10-14 DIAGNOSIS — R002 Palpitations: Secondary | ICD-10-CM | POA: Insufficient documentation

## 2022-10-14 DIAGNOSIS — R942 Abnormal results of pulmonary function studies: Secondary | ICD-10-CM

## 2022-10-14 LAB — TROPONIN I (HIGH SENSITIVITY)
Troponin I (High Sensitivity): 6 ng/L (ref <18)
Troponin I (High Sensitivity): 8 ng/L (ref <18)

## 2022-10-14 LAB — BASIC METABOLIC PANEL
Anion gap: 13 (ref 5–15)
BUN: 22 mg/dL — ABNORMAL HIGH (ref 6–20)
CO2: 20 mmol/L — ABNORMAL LOW (ref 22–32)
Calcium: 9.6 mg/dL (ref 8.9–10.3)
Chloride: 104 mmol/L (ref 98–111)
Creatinine, Ser: 0.9 mg/dL (ref 0.61–1.24)
GFR, Estimated: >60 mL/min (ref >60)
Glucose, Bld: 168 mg/dL — ABNORMAL HIGH (ref 70–99)
Potassium: 4 mmol/L (ref 3.5–5.1)
Sodium: 137 mmol/L (ref 135–145)

## 2022-10-14 LAB — CBC
HCT: 43.6 % (ref 39.0–52.0)
Hemoglobin: 14.8 g/dL (ref 13.0–17.0)
MCH: 28.6 pg (ref 26.0–34.0)
MCHC: 33.9 g/dL (ref 30.0–36.0)
MCV: 84.3 fL (ref 80.0–100.0)
Platelets: 356 10*3/uL (ref 150–400)
RBC: 5.17 MIL/uL (ref 4.22–5.81)
RDW: 12.9 % (ref 11.5–15.5)
WBC: 11.8 10*3/uL — ABNORMAL HIGH (ref 4.0–10.5)
nRBC: 0 % (ref 0.0–0.2)

## 2022-10-14 MED ORDER — LACTATED RINGERS IV BOLUS
1000.0000 mL | Freq: Once | INTRAVENOUS | Status: AC
  Administered 2022-10-14: 1000 mL via INTRAVENOUS

## 2022-10-14 NOTE — Progress Notes (Deleted)
Cardiology Clinic Note   Patient Name: Juan Kline Date of Encounter: 10/14/2022  Primary Care Provider:  Claiborne Rigg, NP Primary Cardiologist:  Chrystie Nose, MD  Patient Profile    Juan Kline 52 year old male presents the clinic today for follow-up evaluation of his palpitations and shortness of breath.  Past Medical History    Past Medical History:  Diagnosis Date   Aortic atherosclerosis (HCC)    Chronic foot ulcer (HCC) admitted 03/02/2014   medial border first metatarsal left foot  - resolved per patient 03/20/20   Diabetes (HCC)    type 2 - no meds, diet controlled   Diabetic foot infection (HCC)    First degree AV block    GAD (generalized anxiety disorder)    GERD (gastroesophageal reflux disease)    Hx of migraines    Hyperlipidemia    Hypertension Dx 2009   Mild depression    Osteomyelitis left first metatarsal 03/02/2014   Sleep apnea    not using CPAP- machine being looked at for repair 03/21/20    Venous stasis ulcer of left lower extremity (HCC)    [name] 03/02/2014 - resolved per patient 03/20/20   Past Surgical History:  Procedure Laterality Date   BIOPSY  08/18/2019   Procedure: BIOPSY;  Surgeon: Tressia Danas, MD;  Location: [facility];  Service: Gastroenterology;;   ESOPHAGOGASTRODUODENOSCOPY (EGD) WITH PROPOFOL N/A 08/18/2019   Procedure: ESOPHAGOGASTRODUODENOSCOPY (EGD) WITH PROPOFOL;  Surgeon: Tressia Danas, MD;  Location: [facility];  Service: Gastroenterology;  Laterality: N/A;    Allergies  Allergies  Allergen Reactions   Pregabalin Anxiety and Swelling    History of Present Illness    Juan Kline has a PMH of hypertension, generalized anxiety disorder, palpitations, depression, and shortness of breath.  He presented to the emergency department on 10/08/2022 and was discharged on 10/10/2022.  He reported episodes of intermittent facial numbness, numbness in his hands and palpitations which have been ongoing for years.  He  indicated that he had taken his blood pressure and noted it was high.  He reported that he was trying to eat better.  He noted that his anxiety was bothering him.  He had a virtual visit scheduled with psychiatry.  He had been taking Ativan which was helping with his symptoms.  He noted that he was feeling short of breath which was common for him.  He felt that it was hard for him to get sleep.  He noted that he was sensitive to noise and light.  He denied alcohol use.  He denied chest pain, lower extremity swelling and orthopnea.  He was felt to be hemodynamically stable for discharge and instructed to follow-up with psychiatry.  He reached out to cardiology via MyChart with an EKG that showed an irregularly irregular rhythm.  He was instructed to schedule follow-up.  [name]  *** denies chest pain, shortness of breath, lower extremity edema, fatigue, palpitations, melena, hematuria, hemoptysis, diaphoresis, weakness, presyncope, syncope, orthopnea, and PND.  [name]  Presented to the emergency department with shortness of breath, facial numbness, and hand numbness.  Consecutive EKG showed sinus rhythm with first-degree AV block. Avoid triggers caffeine, chocolate, EtOH, dehydration etc. Continue metoprolol  Shortness of breath, COPD-chronic.  Stable. Increase physical activity as tolerated Follows with PCP  Essential hypertension-BP today***. Continue amlodipine, metoprolol Maintain blood pressure log Heart healthy low-sodium diet  Hyperlipidemia-LDL*** High-fiber diet Continue  atorvastatin, fenofibrate, aspirin Follows with PCP  Disposition: Follow-up with Dr. Rennis Golden or me in 6 months.   Home Medications    Prior to Admission medications   Medication Sig Start Date End Date Taking? Authorizing Provider  Accu-Chek Softclix Lancets lancets Check blood  glucose level by fingerstick once per day.  E11.65 06/17/22   Claiborne Rigg, NP  amLODipine (NORVASC) 10 MG tablet Take 1 tablet (10 mg total) by mouth daily. 09/16/22   Claiborne Rigg, NP  aspirin 81 MG EC tablet Take 1 tablet (81 mg total) by mouth daily. Swallow whole. 01/05/21   Dorcas Carrow, MD  atorvastatin (LIPITOR) 40 MG tablet Take 1.5 tablets (60 mg total) by mouth daily. 09/03/22   Claiborne Rigg, NP  escitalopram (LEXAPRO) 10 MG tablet Take 0.5 tablets (5 mg total) by mouth at bedtime for 6 days, THEN 1 tablet (10 mg total) at bedtime. 10/10/22 11/17/22    fenofibrate (TRICOR) 145 MG tablet Take 1 tablet (145 mg total) by mouth daily. 09/16/22   Claiborne Rigg, NP  glucose blood (ACCU-CHEK GUIDE) test strip Use as instructed. Check blood glucose by fingerstick twice per day. 06/17/22   Claiborne Rigg, NP  losartan (COZAAR) 100 MG tablet Take 1 tablet (100 mg total) by mouth daily. 09/16/22   Claiborne Rigg, NP  Magnesium 400 MG CAPS Take 400 mg by mouth daily.    [provider]  meloxicam (MOBIC) 15 MG tablet Take 1 tablet (15 mg total) by mouth daily. 08/24/22     metoprolol tartrate (LOPRESSOR) 25 MG tablet Take 1 tablet (25 mg total) by mouth 2 (two) times daily as needed (PALPATATIONS). 07/09/22   Sheilah Pigeon, PA-C  omeprazole (PRILOSEC) 20 MG capsule Take 1 capsule (20 mg total) by mouth 2 (two) times daily before a meal. 02/28/22   Claiborne Rigg, NP    Family History    Family History  Problem Relation Age of Onset   Hypertension Mother    Hyperlipidemia Mother    Diabetes Mother    Kidney disease Mother    Hypertension Father    Hyperlipidemia Father    Diabetes Brother    Hypertension Brother    Hyperlipidemia Brother    Heart disease Maternal Grandmother    COPD Maternal Grandmother    Diabetes Maternal Grandfather    Heart attack Paternal Grandmother    Heart disease Paternal Grandfather    Colon cancer Neg Hx    Colon polyps Neg Hx    Stomach  cancer Neg Hx    Esophageal cancer Neg Hx    Ulcerative colitis Neg Hx    He indicated that his mother is alive. He indicated that his father is alive. He indicated that his sister is alive. He indicated that his brother is alive. He indicated that his maternal grandmother is deceased. He indicated that his maternal grandfather is deceased. He indicated that his paternal grandmother is deceased. He indicated that his paternal grandfather is deceased. He indicated that the status of his neg hx is unknown.  Social History    Social History   Socioeconomic History   Marital status: Single    Spouse name: Not on file   Number of children: 0   Years of education: Not on file   Highest education level: 9th grade  Occupational History   Not on file  Tobacco Use   Smoking status: Former    Packs/day: 1.50    Years: 26.00  Additional pack years: 0.00    Total pack years: 39.00    Types: E-cigarettes, Cigarettes    Start date: 1989    Quit date: 02/16/2014    Years since quitting: 8.6   Smokeless tobacco: Never   Tobacco comments:    occasional vapes - no nicotine  Vaping Use   Vaping Use: Former   Start date: 04/15/2015   Quit date: 04/11/2019   Substances: Nicotine  Substance and Sexual Activity   Alcohol use: Not Currently    Comment: None 03/28/21, 03/02/2014 "I rarely drink"   Drug use: No   Sexual activity: Not Currently  Other Topics Concern   Not on file  Social History Narrative   Pt lives in 1 story home with his mother and step father   9th grade education   Last occupation was with [employer] in 2009      Unemployed at this time- thinking of filing for disability      Right handed      Social Determinants of Health   Financial Resource Strain: Patient Declined (09/12/2022)   Overall Financial Resource Strain (CARDIA)    Difficulty of Paying Living Expenses: Patient declined  Food Insecurity: No Food Insecurity (09/12/2022)   Hunger Vital Sign    Worried About  Running Out of Food in the Last Year: Never true    Ran Out of Food in the Last Year: Never true  Transportation Needs: No Transportation Needs (09/12/2022)   PRAPARE - Administrator, Civil Service (Medical): No    Lack of Transportation (Non-Medical): No  Physical Activity: Unknown (09/12/2022)   Exercise Vital Sign    Days of Exercise per Week: 0 days    Minutes of Exercise per Session: Not on file  Stress: Stress Concern Present (09/12/2022)   [employer] of Occupational Health - Occupational Stress Questionnaire    Feeling of Stress : Very much  Social Connections: Socially Isolated (09/12/2022)   Social Connection and Isolation Panel [NHANES]    Frequency of Communication with Friends and Family: Once a week    Frequency of Social Gatherings with Friends and Family: Never    Attends Religious Services: Never    Database administrator or Organizations: No    Attends Engineer, structural: Not on file    Marital Status: Never married  Intimate Partner Violence: Not on file     Review of Systems    General:  No chills, fever, night sweats or weight changes.  Cardiovascular:  No chest pain, dyspnea on exertion, edema, orthopnea, palpitations, paroxysmal nocturnal dyspnea. Dermatological: No rash, lesions/masses Respiratory: No cough, dyspnea Urologic: No hematuria, dysuria Abdominal:   No nausea, vomiting, diarrhea, bright red blood per rectum, melena, or hematemesis Neurologic:  No visual changes, wkns, changes in mental status. All other systems reviewed and are otherwise negative except as noted above.  Physical Exam    VS:  There were no vitals taken for this visit. , BMI There is no height or weight on file to calculate BMI. GEN: Well nourished, well developed, in no acute distress. HEENT: normal. Neck: Supple, no JVD, carotid bruits, or masses. Cardiac: RRR, no murmurs, rubs, or gallops. No clubbing, cyanosis, edema.  Radials/DP/PT 2+ and equal  bilaterally.  Respiratory:  Respirations regular and unlabored, clear to auscultation bilaterally. GI: Soft, nontender, nondistended, BS + x 4. MS: no deformity or atrophy. Skin: warm and dry, no rash. Neuro:  Strength and sensation are intact. Psych: Normal affect.  Accessory Clinical Findings    Recent Labs: 11/18/2021: TSH 2.310 06/17/2022: Hemoglobin 14.1; Platelets 356 09/16/2022: ALT 27; BUN 28; Creatinine, Ser 1.22; Potassium 5.1; Sodium 138   Recent Lipid Panel    Component Value Date/Time   CHOL 182 02/28/2022 1146   TRIG 142 02/28/2022 1146   HDL 49 02/28/2022 1146   CHOLHDL 3.7 02/28/2022 1146   CHOLHDL 4 03/28/2021 1031   VLDL 35.8 03/28/2021 1031   LDLCALC 108 (H) 02/28/2022 1146    No BP recorded.  {Refresh Note OR Click here to enter BP  :1}***    ECG personally reviewed by me today- *** - No acute changes  Echocardiogram 01/04/2021  IMPRESSIONS     1. Left ventricular ejection fraction, by estimation, is 60 to 65%. The  left ventricle has normal function. The left ventricle has no regional  wall motion abnormalities. There is mild concentric left ventricular  hypertrophy. Indeterminate diastolic  filling due to E-A fusion.   2. Right ventricular systolic function is low normal. The right  ventricular size is mildly enlarged. Tricuspid regurgitation signal is  inadequate for assessing PA pressure.   3. Left atrial size was mildly dilated.   4. Right atrial size was mildly dilated.   5. The mitral valve is grossly normal. No evidence of mitral valve  regurgitation. No evidence of mitral stenosis. Moderate mitral annular  calcification.   6. The aortic valve was not well visualized. Aortic valve regurgitation  is not visualized. No aortic stenosis is present.   7. The inferior vena cava is normal in size with greater than 50%  respiratory variability, suggesting right atrial pressure of 3 mmHg.   Comparison(s): Prior images reviewed side by side.    Conclusion(s)/Recommendation(s): Otherwise normal echocardiogram, with  minor abnormalities described in the report.   FINDINGS   Left Ventricle: Left ventricular ejection fraction, by estimation, is 60  to 65%. The left ventricle has normal function. The left ventricle has no  regional wall motion abnormalities. Definity contrast agent was given IV  to delineate the left ventricular   endocardial borders. The left ventricular internal cavity size was normal  in size. There is mild concentric left ventricular hypertrophy.  Indeterminate diastolic filling due to E-A fusion.   Right Ventricle: The right ventricular size is mildly enlarged. Right  vetricular wall thickness was not well visualized. Right ventricular  systolic function is low normal. Tricuspid regurgitation signal is  inadequate for assessing PA pressure.   Left Atrium: Left atrial size was mildly dilated.   Right Atrium: Right atrial size was mildly dilated.   Pericardium: Trivial pericardial effusion is present. Presence of  pericardial fat pad.   Mitral Valve: The mitral valve is grossly normal. Moderate mitral annular  calcification. No evidence of mitral valve regurgitation. No evidence of  mitral valve stenosis.   Tricuspid Valve: The tricuspid valve is grossly normal. Tricuspid valve  regurgitation is not demonstrated. No evidence of tricuspid stenosis.   Aortic Valve: The aortic valve was not well visualized. Aortic valve  regurgitation is not visualized. No aortic stenosis is present. Aortic  valve mean gradient measures 4.0 mmHg. Aortic valve peak gradient measures  7.8 mmHg. Aortic valve area, by VTI  measures 1.61 cm.   Pulmonic Valve: The pulmonic valve was not well visualized. Pulmonic valve  regurgitation is not visualized. No evidence of pulmonic stenosis.   Aorta: The aortic root and ascending aorta are structurally normal, with  no evidence of dilitation.   Venous: The  inferior vena cava  is normal in size with greater than 50%  respiratory variability, suggesting right atrial pressure of 3 mmHg.   IAS/Shunts: The atrial septum is grossly normal.    Nuclear stress test 04/26/2021    The study is normal. The study is low risk.   No ST deviation was noted.   LV perfusion is normal. There is no evidence of ischemia. There is no evidence of infarction.   Left ventricular function is normal. Nuclear stress EF: 55 %. The left ventricular ejection fraction is normal (55-65%). End diastolic cavity size is normal.   Prior study not available for comparison.   Reduced apical counts with normal wall motion consistent with apical thinning artifact.  Normal study without ischemia or infarction.  Normal LVEF=55%.  This is a low-risk study.   Assessment & Plan   1.  ***   Thomasene Ripple.  NP-C     10/14/2022, 4:10 PM [facility] [address] Office [phone] Fax [phone]    I spent***minutes examining this patient, reviewing medications, and using patient centered shared decision making involving her cardiac care.  Prior to her visit I spent greater than 20 minutes reviewing her past medical history,  medications, and prior cardiac tests.

## 2022-10-14 NOTE — ED Provider Notes (Signed)
Juan Kline EMERGENCY DEPARTMENT AT [facility] Provider Note   CSN: [id number] Arrival date & time: 10/14/22  1837     History Chief Complaint  Patient presents with   Palpitations    HPI Juan Kline is a 52 y.o. male presenting for presyncope.   Patient's recorded medical, surgical, social, medication list and allergies were reviewed in the Snapshot window as part of the initial history.   Review of Systems   Review of Systems  Constitutional:  Negative for chills and fever.  HENT:  Negative for ear pain and sore throat.   Eyes:  Negative for pain and visual disturbance.  Respiratory:  Negative for cough and shortness of breath.   Cardiovascular:  Positive for palpitations. Negative for chest pain.  Gastrointestinal:  Negative for abdominal pain and vomiting.  Genitourinary:  Negative for dysuria and hematuria.  Musculoskeletal:  Negative for arthralgias and back pain.  Skin:  Negative for color change and rash.  Neurological:  Negative for seizures and syncope.  All other systems reviewed and are negative.   Physical Exam Updated Vital Signs BP 113/62   Pulse 72   Temp 97.7 F (36.5 C)   Resp 18   Ht 5\' 9"  (1.753 m)   Wt (!) 151.5 kg   SpO2 97%   BMI 49.32 kg/m  Physical Exam Vitals and nursing note reviewed.  Constitutional:      General: He is not in acute distress.    Appearance: He is well-developed.  HENT:     Head: Normocephalic and atraumatic.  Eyes:     Conjunctiva/sclera: Conjunctivae normal.  Cardiovascular:     Rate and Rhythm: Normal rate and regular rhythm.     Heart sounds: No murmur heard. Pulmonary:     Effort: Pulmonary effort is normal. No respiratory distress.     Breath sounds: Normal breath sounds.  Abdominal:     Palpations: Abdomen is soft.     Tenderness: There is no abdominal tenderness.  Musculoskeletal:        General: No swelling.     Cervical back: Neck supple.  Skin:    General: Skin is warm and dry.      Capillary Refill: Capillary refill takes less than 2 seconds.  Neurological:     Mental Status: He is alert.  Psychiatric:        Mood and Affect: Mood normal.      ED Course/ Medical Decision Making/ A&P Clinical Course as of 10/14/22 2331  Tue Oct 14, 2022  2130 Multiple complaints Difficulty sleeping, worsening anxiety, palpitations, follows with cardiology.  [CC]    Clinical Course User Index [CC] Glyn Ade, MD    Procedures Procedures   Medications Ordered in ED Medications  lactated ringers bolus 1,000 mL (0 mLs Intravenous Stopped 10/14/22 2243)   Medical Decision Making:   Juan Kline is a 52 y.o. male who presented to the ED today with a presyncopal episode detailed above.    Complete initial physical exam performed, notably the patient  was HDS in NAD.    Reviewed and confirmed nursing documentation for past medical history, family history, social history.    Initial Assessment:   With the patient's presentation of near syncope, most likely diagnosis is orthostatic hypotension vs vasovagal episode. Other diagnoses were considered including (but not limited to) arrythmogenic syncope, valvular abnormality, PE, aortic dissection. These are considered less likely due to history of present illness and physical exam findings.   This is most consistent with  an acute life/limb threatening illness complicated by underlying chronic conditions. In particular, concerning cardiac etiology, this is less likely to be the etiology given the lack of chest pain, lack of serious comorbidities including heart failure or CAD.   Additionally, patient's history appears more consistent with benign episodes including orthostatic event vs  vagal episode based on prodrome and spontaneous resolution.  Initial Plan:  Screening labs including CBC and Metabolic panel to evaluate for infectious or metabolic etiology of disease.  Urinalysis with reflex culture ordered to evaluate for UTI or  relevant urologic/nephrologic pathology.  CXR to evaluate for structural/infectious intrathoracic pathology.  Troponin/EKG to evaluate for cardiac pathology. Utilization of FAINT scoring detailed above.  Objective evaluation as below reviewed after administration of IVF/Telemetry monitoring  Initial Study Results:   Laboratory  All laboratory results reviewed without evidence of clinically relevant pathology.     EKG EKG was reviewed independently. Rate, rhythm, axis, intervals all examined and without medically relevant abnormality. ST segments without concerns for elevations.    Radiology:  All images reviewed independently. Agree with radiology report at this time.   DG Chest 2 View  Result Date: 10/14/2022 CLINICAL DATA:  Shortness of breath EXAM: CHEST - 2 VIEW COMPARISON:  10/08/2022 FINDINGS: Heart and mediastinal contours are within normal limits. No focal opacities or effusions. No acute bony abnormality. Aortic atherosclerosis. IMPRESSION: No active cardiopulmonary disease. Electronically Signed   By: Charlett Nose M.D.   On: 10/14/2022 19:29   DG Chest Portable 1 View  Result Date: 10/08/2022 CLINICAL DATA:  Shortness of breath, palpitations EXAM: PORTABLE CHEST 1 VIEW COMPARISON:  01/03/2021 FINDINGS: Cardiac and mediastinal contours are within normal limits given AP technique. Aortic atherosclerosis. Redemonstrated eventration of the right hemidiaphragm. Bibasilar atelectasis. No new focal pulmonary opacity. No pleural effusion or pneumothorax. No acute osseous abnormality. IMPRESSION: No acute cardiopulmonary process. Electronically Signed   By: Wiliam Ke M.D.   On: 10/08/2022 14:51    Final Assessment and Plan:   Patients HPI and PE findings are not consistent with any acute pathology.  Patient stable for follow-up with PCP in outpatient setting for further diagnostic care and management. Disposition:  I have considered need for hospitalization, however, considering all of  the above, I believe this patient is stable for discharge at this time.  Patient/family educated about specific return precautions for given chief complaint and symptoms.  Patient/family educated about follow-up with PCP.     Patient/family expressed understanding of return precautions and need for follow-up. Patient spoken to regarding all imaging and laboratory results and appropriate follow up for these results. All education provided in verbal form with additional information in written form. Time was allowed for answering of patient questions. Patient discharged.    Emergency Department Medication Summary:   Medications  lactated ringers bolus 1,000 mL (0 mLs Intravenous Stopped 10/14/22 2243)           Clinical Impression:  1. Palpitations      Discharge   Final Clinical Impression(s) / ED Diagnoses Final diagnoses:  Palpitations    Rx / DC Orders ED Discharge Orders     None         Glyn Ade, MD 10/14/22 [phone]

## 2022-10-14 NOTE — ED Triage Notes (Signed)
Patient here POV from Home.  Endorses Intermittent Palpitations and SOB over past few weeks. Some Mild CP. No known fevers.   NAD Noted during Triage. A&Ox4. GCS 15. Ambulatory.

## 2022-10-15 ENCOUNTER — Ambulatory Visit: Payer: Medicaid Other | Admitting: General Practice

## 2022-10-15 ENCOUNTER — Encounter: Payer: Self-pay | Admitting: Nurse Practitioner

## 2022-10-15 ENCOUNTER — Encounter (HOSPITAL_COMMUNITY): Payer: Self-pay | Admitting: Behavioral Health

## 2022-10-15 ENCOUNTER — Emergency Department (HOSPITAL_COMMUNITY)
Admission: EM | Admit: 2022-10-15 | Discharge: 2022-10-16 | Disposition: A | Discharge status: Other Acute Inpatient Hospital | Payer: Medicaid Other | Attending: Emergency Medicine | Admitting: Emergency Medicine

## 2022-10-15 ENCOUNTER — Ambulatory Visit (HOSPITAL_COMMUNITY)
Admission: EM | Admit: 2022-10-15 | Discharge: 2022-10-15 | Disposition: A | Discharge status: Home / Self Care | Payer: Medicaid Other | Attending: Behavioral Health | Admitting: Behavioral Health

## 2022-10-15 ENCOUNTER — Encounter (HOSPITAL_COMMUNITY): Payer: Self-pay

## 2022-10-15 ENCOUNTER — Other Ambulatory Visit: Payer: Self-pay

## 2022-10-15 DIAGNOSIS — E1165 Type 2 diabetes mellitus with hyperglycemia: Secondary | ICD-10-CM | POA: Insufficient documentation

## 2022-10-15 DIAGNOSIS — G4709 Other insomnia: Secondary | ICD-10-CM | POA: Insufficient documentation

## 2022-10-15 DIAGNOSIS — F411 Generalized anxiety disorder: Secondary | ICD-10-CM | POA: Insufficient documentation

## 2022-10-15 DIAGNOSIS — Z7982 Long term (current) use of aspirin: Secondary | ICD-10-CM | POA: Insufficient documentation

## 2022-10-15 DIAGNOSIS — R7981 Abnormal blood-gas level: Secondary | ICD-10-CM | POA: Insufficient documentation

## 2022-10-15 DIAGNOSIS — I10 Essential (primary) hypertension: Secondary | ICD-10-CM | POA: Insufficient documentation

## 2022-10-15 DIAGNOSIS — G4700 Insomnia, unspecified: Secondary | ICD-10-CM | POA: Diagnosis present

## 2022-10-15 DIAGNOSIS — Z79899 Other long term (current) drug therapy: Secondary | ICD-10-CM | POA: Insufficient documentation

## 2022-10-15 DIAGNOSIS — F419 Anxiety disorder, unspecified: Secondary | ICD-10-CM | POA: Diagnosis present

## 2022-10-15 DIAGNOSIS — F410 Panic disorder [episodic paroxysmal anxiety] without agoraphobia: Secondary | ICD-10-CM | POA: Diagnosis not present

## 2022-10-15 DIAGNOSIS — R202 Paresthesia of skin: Secondary | ICD-10-CM | POA: Diagnosis present

## 2022-10-15 LAB — CBC WITH DIFFERENTIAL/PLATELET
Abs Immature Granulocytes: 0.04 10*3/uL (ref 0.00–0.07)
Basophils Absolute: 0 10*3/uL (ref 0.0–0.1)
Basophils Relative: 0 %
Eosinophils Absolute: 0.2 10*3/uL (ref 0.0–0.5)
Eosinophils Relative: 2 %
HCT: 40.3 % (ref 39.0–52.0)
Hemoglobin: 13.6 g/dL (ref 13.0–17.0)
Immature Granulocytes: 0 %
Lymphocytes Relative: 33 %
Lymphs Abs: 3.3 10*3/uL (ref 0.7–4.0)
MCH: 29 pg (ref 26.0–34.0)
MCHC: 33.7 g/dL (ref 30.0–36.0)
MCV: 85.9 fL (ref 80.0–100.0)
Monocytes Absolute: 0.8 10*3/uL (ref 0.1–1.0)
Monocytes Relative: 8 %
Neutro Abs: 5.6 10*3/uL (ref 1.7–7.7)
Neutrophils Relative %: 57 %
Platelets: 316 10*3/uL (ref 150–400)
RBC: 4.69 MIL/uL (ref 4.22–5.81)
RDW: 13.1 % (ref 11.5–15.5)
WBC: 10 10*3/uL (ref 4.0–10.5)
nRBC: 0 % (ref 0.0–0.2)

## 2022-10-15 LAB — BASIC METABOLIC PANEL
Anion gap: 11 (ref 5–15)
BUN: 24 mg/dL — ABNORMAL HIGH (ref 6–20)
CO2: 21 mmol/L — ABNORMAL LOW (ref 22–32)
Calcium: 8.5 mg/dL — ABNORMAL LOW (ref 8.9–10.3)
Chloride: 103 mmol/L (ref 98–111)
Creatinine, Ser: 0.89 mg/dL (ref 0.61–1.24)
GFR, Estimated: >60 mL/min (ref >60)
Glucose, Bld: 114 mg/dL — ABNORMAL HIGH (ref 70–99)
Potassium: 3.6 mmol/L (ref 3.5–5.1)
Sodium: 135 mmol/L (ref 135–145)

## 2022-10-15 LAB — CBG MONITORING, ED: Glucose-Capillary: 125 mg/dL — ABNORMAL HIGH (ref 70–99)

## 2022-10-15 MED ORDER — LORAZEPAM 1 MG PO TABS
1.0000 mg | ORAL_TABLET | Freq: Once | ORAL | Status: AC
  Administered 2022-10-15: 1 mg via ORAL
  Filled 2022-10-15: qty 1

## 2022-10-15 NOTE — Progress Notes (Signed)
   10/15/22 1505  [facility] Triage Screening (Walk-ins at [facility] only)  How Did You Hear About [name]? Family/Friend  What Is the Reason for Your Visit/Call Today? Juan Millin "[name]" is a 52 y/o male presenting to the [facility] as a walk-in. Hx of anxiety.  Patient states that his anxiety has worsened in the last 2 weeks. He describes the following symptoms  "hot flashes, sensitivity to light, fatigue, and heart palpations". Patient states that because of his anxiety he has not been able to sleep in the past 3 weeks. He took Melatonin 4-5 hrs ago and states that it's had the opposite affect worsening his insomnia symptoms. Denies SI. However, fears that if he doesn't get any sleep he will eventually feel suicidal. No stressors or triggers reported. Denies HI and AVH's. Denies alcohol/drug use. Previously, he has received treatment with the outpatient department at the [facility]. No current therapist/psychiatrist. No hx of inpatient psychiatric treatment. Patient lives with mother and step father. Unemployed. Patient is seeking medication management to alleviate his anxiety symptoms.  How Long Has This Been Causing You Problems? 1 wk - 1 month  Have You Recently Had Any Thoughts About Hurting Yourself? No  Are You Planning to Commit Suicide/Harm Yourself At This time? No  Have you Recently Had Thoughts About Hurting Someone Juan Kline? No  Are You Planning To Harm Someone At This Time? No  Are you currently experiencing any auditory, visual or other hallucinations? No  Have You Used Any Alcohol or Drugs in the Past 24 Hours? No  Do you have any current medical co-morbidities that require immediate attention? No  Clinician description of patient physical appearance/behavior: Calm and cooperative.  What Do You Feel Would Help You the Most Today? Treatment for Depression or other mood problem;Medication(s);Stress Management  If access to [facility] was not available, would you have sought care in the Emergency Department? No   Determination of Need Routine (7 days)  Options For Referral Medication Management;Outpatient Therapy

## 2022-10-15 NOTE — ED Triage Notes (Signed)
Pt seen at [facility] earlier today for anxiety and d/c. Pt states he had an episode of cold chills and he states his face went numb and he had a seizure. He states he remembers the entire event. Denies falling or injury. Denies symptoms on arrival.

## 2022-10-15 NOTE — ED Provider Notes (Signed)
Behavioral Health Urgent Care Medical Screening Exam  Patient Name: Juan Kline MRN: [id number] Date of Evaluation: 10/15/22 Chief Complaint:  "Just life" Diagnosis:  Final diagnoses:  Anxiety  Insomnia, unspecified type   History of Present Illness: Juan Kline is a 52 y.o. male patient with a past psychiatric history of MDD and GAD who presented to [facility] voluntarily and accompanied by his step-father for a walk-in assessment with complaints of worsening anxiety.  Patient assessed face-to-face by this provider and chart reviewed on 10/15/22. On evaluation, Juan Kline is seated in assessment area in no acute distress. Patient is alert and oriented x4, calm, cooperative, and pleasant. Speech is clear and coherent, normal rate and volume. Eye contact is good. Mood is euthymic with congruent affect. Thought process is coherent with logical thought content. Patient denies suicidal and homicidal ideations and easily contracts verbally for safety with this Clinical research associate. Patient denies a history of suicide attempts or self-harm. Patient denies past psychiatric hospitalizations. Patient denies auditory and visual hallucinations. Patient denies symptoms of paranoia. Patient reports his anxiety has worsened in the last 2 weeks and describes having "hot flashes, sensitivity to light, fatigue, and heart palpitations, it's more physical than mental. I woke up that way." Patient reports he has gone to the ED several times recently because "I just wanted to be in the hospital to get some sleep for a few hours but they check everything else out instead." Per chart review, patient presented to [facility] ED at [facility] on 10/14/22 with complaints of palpitations and was discharged. Patient is unable to identify any specific triggers or stressors and states "just life." Patient reports he typically stays at home and doesn't attend family functions. Patient states "it's like agoraphobia." Patient is able to converse coherently  with goal-directed thoughts and no distractibility or preoccupation. Objectively, there is no evidence of psychosis/mania, delusional thinking, or indication that patient is responding to internal or external stimuli.  Patient reports decreased sleep over the past 3 weeks and states he hasn't slept in the past 3 days. Patient states "I'm just tired of this, it's too much." Patient reports he took Melatonin 4-5 hours ago and states it had the opposite affect. Patient reports good appetite. Patient states he lives with his mother and step-father in [place] and denies access to weapons/firearms. Patient states he is currently unemployed. Patient denies use of alcohol or illicit substances. Patient reports he used to see Juan Kline, [place] at [facility] outpatient services for medication management but stopped going "because I was trying to get counseling set up but they never started it." Per chart review, patient last had a visit with Juan Kline, [place] at [facility] outpatient on 08/28/22, was being prescribed Ativan 0.5mg  TID PRN anxiety, was started on Trintellix 5mg  for 7 days then increase to Trintellix 10mg  for 14 days for depressive symptoms, and instructed to follow-up in 6 weeks. Per chart review, patient's past medication trials include: Zoloft, Lexapro, Prozac, buspirone, hydroxyzine, gabapentin, venlafaxine, Cymbalta, and clonazepam (patient continued to experience anxiety as well as physical symptoms). Patient states he still has some Ativan left over, so he has been taking that to manage his anxiety over the past few weeks. Patient states this past week, he went to "[facility]" in [place] and was prescribed Lexapro but hasn't started taking it yet. Patient states he would like to come back to [facility] outpatient for medication management. Patient states he did not come to [facility] today ultimately seeking medications but wants to start therapy instead to  learn coping skills to better manage his anxiety.  Patient  offered support and encouragement. Discussed with patient and provided information in AVS on mindfulness-based stress reduction techniques. Discussed with patient and provided in AVS [facility] outpatient walk-in hours for therapy and medication management. Discussed with patient and provided in AVS additional outpatient psychiatric resources that he can follow-up with for therapy and medication management. Patient is in agreement with plan of care.  At this time, Juan Kline is educated and verbalizes understanding of mental health resources and other crisis services in the community. He is instructed to call 911 and present to the nearest emergency room should he experience any suicidal/homicidal ideation, auditory/visual/hallucinations, or detrimental worsening of his mental health condition.     Flowsheet Row ED from 10/15/2022 in [facility] ED from 10/14/2022 in [facility] Emergency Department at [facility] ED from 10/08/2022 in [facility] Emergency Department at [facility]  C-SSRS RISK CATEGORY No Risk No Risk No Risk       Psychiatric Specialty Exam  Presentation  General Appearance:Appropriate for Environment  Eye Contact:Good  Speech:Clear and Coherent; Normal Rate  Speech Volume:Normal  Handedness:Right   Mood and Affect  Mood: Euthymic  Affect: Congruent   Thought Process  Thought Processes: Coherent; Goal Directed  Descriptions of Associations:Intact  Orientation:Full (Time, Place and Person)  Thought Content:Logical    Hallucinations:None  Ideas of Reference:None  Suicidal Thoughts:No  Homicidal Thoughts:No   Sensorium  Memory: Immediate Good; Recent Good; Remote Good  Judgment: Fair  Insight: Fair   Art therapist  Concentration: Good  Attention Span: Good  Recall: Good  Fund of Knowledge: Good  Language: Good   Psychomotor Activity  Psychomotor Activity: Normal   Assets   Assets: Communication Skills; Desire for Improvement; Financial Resources/Insurance; Housing; Leisure Time; Physical Health; Resilience; Social Support; Transportation   Sleep  Sleep: Poor  Number of hours:  0 (Patient states he hasn't slept in 3 days)   Physical Exam: Physical Exam Vitals and nursing note reviewed.  Constitutional:      General: He is not in acute distress.    Appearance: Normal appearance. He is not ill-appearing.  HENT:     Head: Normocephalic and atraumatic.     Nose: Nose normal.  Eyes:     General:        Right eye: No discharge.        Left eye: No discharge.     Conjunctiva/sclera: Conjunctivae normal.  Cardiovascular:     Rate and Rhythm: Normal rate.  Pulmonary:     Effort: Pulmonary effort is normal. No respiratory distress.  Musculoskeletal:        General: Normal range of motion.     Cervical back: Normal range of motion.  Skin:    General: Skin is warm and dry.  Neurological:     General: No focal deficit present.     Mental Status: He is alert and oriented to person, place, and time. Mental status is at baseline.  Psychiatric:        Attention and Perception: Attention and perception normal.        Mood and Affect: Mood and affect normal.        Speech: Speech normal.        Behavior: Behavior normal. Behavior is cooperative.        Thought Content: Thought content normal. Thought content is not paranoid or delusional. Thought content does not include homicidal or suicidal ideation. Thought content  does not include homicidal or suicidal plan.        Cognition and Memory: Cognition and memory normal.        Judgment: Judgment normal.    Review of Systems  Constitutional: Negative.   HENT: Negative.    Eyes: Negative.   Respiratory: Negative.    Cardiovascular: Negative.   Gastrointestinal: Negative.   Genitourinary: Negative.   Musculoskeletal: Negative.   Skin: Negative.   Neurological: Negative.   Endo/Heme/Allergies:  Negative.   Psychiatric/Behavioral:  Negative for depression, hallucinations, memory loss, substance abuse and suicidal ideas. The patient has insomnia. The patient is not nervous/anxious.    Blood pressure (!) 143/92, pulse 95, temperature 97.6 F (36.4 C), temperature source Oral, resp. rate 18, height 5\' 9"  (1.753 m), weight (!) 325 lb (147.4 kg), SpO2 98 %. Body mass index is 47.99 kg/m.  Musculoskeletal: Strength & Muscle Tone: within normal limits Gait & Station: normal Patient leans: N/A   [facility] MSE Discharge Disposition for Follow up and Recommendations: Based on my evaluation the patient does not appear to have an emergency medical condition and can be discharged with resources and follow up care in outpatient services for Medication Management and Individual Therapy   Sunday Corn, NP 10/15/2022, 4:43 PM

## 2022-10-15 NOTE — ED Notes (Signed)
Patient discharged by provider Amanda Sellers, NP with written and verbal instructions. Resources provided.  

## 2022-10-15 NOTE — Discharge Instructions (Addendum)
[facility]: Outpatient psychiatric Services:   Please see the walk in hours listed below.  Medication Management New Patient needing Medication Management Walk-in, and Existing Patients needing to see a provider for management coming as a walk in   Monday thru Friday 8:00 AM first come first serve until slots are full.  Recommend being there by 7:15 AM to ensure a slot is open.  Therapy New Patient Therapy Intake and Existing Patients needing to see therapist coming in as a walk in.   Monday, Wednesday, and Thursday morning at 8:00 am first come first serve.  Recommend being there by 7:15 AM to ensure a slot is open.    Every 1st, 2nd, and 3rd Friday at 1:00 PM first come first serve until slots are full.  Will still need to come in that morning at 7:15 AM to get registered for an afternoon slot.  For all walk-ins we ask that you arrive by 7:15 am because patients will be seen in there order of arrival (FIRST COME FIRST SERVE) Availability is limited, therefore you may not be seen on the same day that you walk in if all slots are full.    Our goal is to serve and meet the needs of our community to the best of our ability.    Based on what you have shared, a list of resources for outpatient therapy and psychiatry is provided below to get you started back on treatment.  It is imperative that you follow through with treatment within 5-7 days from the day of discharge to prevent any further risk to your safety or mental well-being.  You are not limited to the list provided.  In case of an urgent crisis, you may contact the Mobile Crisis Unit with [facility] at [phone].        Outpatient Services for Therapy and Medication Management for [facility]  [facility] [address], [place], [address] [phone] phone  [facility] [address] [place], [place], [address] [phone]  phone  [facility] [address] [place], [place], [address] [phone] phone [phone] phone ([address], Anthem/Elevance, [address], [address], [address], [address], Healthy [place], [place], [place], [address], [employer]oco[employer]illips, [place], UHC, American Financial, [place], Out of Network)  [employer] [facility], [place] [address] [place], [place], [address] [phone] phone ([place], Anthem/Elevance, Sanmina-SCI Options/Carelon, BCBS, [address], [place], [place], [place], [place], [employer], [place], [place], [place], [facility])  [employer] [facility] [address] [place], [place], [address] [phone] phone (Medicaid, ask about other insurance)  [facility] [address], [address] [place], [place], [address] [phone] phone [phone] fax ([address] [place] , [place], [place], [place] Health Choice, UHC, General Electric, Self-Pay)  [facility] [address] [place], [place], [address] [phone] phone ([address] Anthem/Elevance, [address], [address], [place], CSX Corporation, [place], [place], [place], Har[employer], [place], [place], [place], [facility])  [facility] - 6-8 MONTH WAIT FOR THERAPY; SOONER FOR MEDICATION MANAGEMENT [address], [address] [place], [place], [address] [phone] phone ([address] AmeriHealth [address] - [place], [address], [place], [place], Friday Health Plans, [address], BCBS Healthy [place], [place], [address], [place], [place], [place], [place], Tricare, UHC, Safeco Corporation, [place])  [facility] [address], [address] [place], [place], [address] [phone] phone  [facility] [address] [place], [place], [address] [phone] phone  [facility] [address] [place], [place], [address] [phone] phone  [facility] [place] - THERAPY ONLY [address], [place], [address] [phone] phone  [facility], [place] [address], [place], [address] [phone] phone  [facility] [address] [place], [place], [address] [phone] phone [phone] fax  [facility] [address] [place], [place], [address] [phone] phone [phone] fax  [facility] [phone] [address] [place], [place], [address] [phone] phone  Jovita Kussmaul [address] [place], [place], [address]  [phone] phone  [facility]  (Adults Only) [address] [place], [place], [address]  [phone] phone [phone] fax

## 2022-10-16 ENCOUNTER — Ambulatory Visit (HOSPITAL_COMMUNITY)
Admission: EM | Admit: 2022-10-16 | Discharge: 2022-10-16 | Disposition: A | Discharge status: Home / Self Care | Payer: Medicaid Other | Attending: Nurse Practitioner | Admitting: Nurse Practitioner

## 2022-10-16 DIAGNOSIS — F419 Anxiety disorder, unspecified: Secondary | ICD-10-CM

## 2022-10-16 DIAGNOSIS — G4709 Other insomnia: Secondary | ICD-10-CM | POA: Insufficient documentation

## 2022-10-16 NOTE — Discharge Instructions (Signed)

## 2022-10-16 NOTE — Discharge Instructions (Signed)
Please go to the Behavioral Health Urgent Care to continue your care. If you have any concerns, new or worsening symptoms, please return to the nearest ER for re-evaluation.   Contact a doctor if: You have paresthesia that gets worse or does not go away. You lose feeling (have numbness) after an injury. Your burning or prickling feeling gets worse when you walk. You have pain or cramps. You feel dizzy or you faint. You have a rash. Get help right away if: You feel weak or have new weakness in an arm or leg. You have trouble walking or moving. You have problems speaking, understanding, or seeing. You feel confused. You cannot control when you pee (urinate) or poop (have a bowel movement). These symptoms may be an emergency. Get help right away. Call 911. Do not wait to see if the symptoms will go away. Do not drive yourself to the hospital.

## 2022-10-16 NOTE — ED Provider Notes (Signed)
[facility]ERGENCY DEPARTMENT AT [facility] Provider Note   CSN: [id number] Arrival date & time: 10/15/22  7829     History Chief Complaint  Patient presents with   Chills   Psychiatric Evaluation    Juan Kline is a 52 y.o. male with history of diabetes, hypertension, anxiety, panic disorder presents emerged from today for evaluation of insomnia.  Patient was seen earlier today with behavioral health however he reports that he does not do anything to help him.  He has not taken any of his Ativan to help with this.  He is send prescribed Lexapro by a provider however did not pick this up or take this given that he was worried about interactions with his other medications.  He reports that whenever he goes to fall asleep he feels tingling in his face, head, and arms and hands.  He is concerned that he is going to have a seizure.  He has no history of any seizures.  Denies any history of any tobacco, EtOH, licit drug use.  He denies any chest pain, shortness of breath, fevers, or any syncope.  Denies any headache or any visual changes. HPI     Home Medications Prior to Admission medications   Medication Sig Start Date End Date Taking? Authorizing Provider  Accu-Chek Softclix Lancets lancets Check blood glucose level by fingerstick once per day.  E11.65 06/17/22   Claiborne Rigg, NP  amLODipine (NORVASC) 10 MG tablet Take 1 tablet (10 mg total) by mouth daily. 09/16/22   Claiborne Rigg, NP  aspirin 81 MG EC tablet Take 1 tablet (81 mg total) by mouth daily. Swallow whole. 01/05/21   Dorcas Carrow, MD  atorvastatin (LIPITOR) 40 MG tablet Take 1.5 tablets (60 mg total) by mouth daily. 09/03/22   Claiborne Rigg, NP  escitalopram (LEXAPRO) 10 MG tablet Take 0.5 tablets (5 mg total) by mouth at bedtime for 6 days, THEN 1 tablet (10 mg total) at bedtime. 10/10/22 11/17/22    fenofibrate (TRICOR) 145 MG tablet Take 1 tablet (145 mg total) by mouth daily. 09/16/22   Claiborne Rigg, NP   glucose blood (ACCU-CHEK GUIDE) test strip Use as instructed. Check blood glucose by fingerstick twice per day. 06/17/22   Claiborne Rigg, NP  losartan (COZAAR) 100 MG tablet Take 1 tablet (100 mg total) by mouth daily. 09/16/22   Claiborne Rigg, NP  Magnesium 400 MG CAPS Take 400 mg by mouth daily.    [provider]  meloxicam (MOBIC) 15 MG tablet Take 1 tablet (15 mg total) by mouth daily. 08/24/22     metoprolol tartrate (LOPRESSOR) 25 MG tablet Take 1 tablet (25 mg total) by mouth 2 (two) times daily as needed (PALPATATIONS). 07/09/22   Sheilah Pigeon, PA-C  omeprazole (PRILOSEC) 20 MG capsule Take 1 capsule (20 mg total) by mouth 2 (two) times daily before a meal. 02/28/22   Claiborne Rigg, NP      Allergies    Pregabalin    Review of Systems   Review of Systems  Constitutional:  Negative for chills and fever.  Eyes:  Negative for photophobia and visual disturbance.  Respiratory:  Negative for shortness of breath.   Cardiovascular:  Negative for chest pain.  Neurological:  Negative for dizziness, syncope, light-headedness and headaches.       Reports paresthesias  Psychiatric/Behavioral:  Positive for sleep disturbance. The patient is nervous/anxious.     Physical Exam Updated Vital Signs BP  138/84 (BP Location: Left Arm)   Pulse 84   Temp 98 F (36.7 C) (Oral)   Resp 18   Ht 5\' 9"  (1.753 m)   Wt (!) 147.4 kg   SpO2 100%   BMI 47.99 kg/m  Physical Exam Vitals and nursing note reviewed.  Constitutional:      General: He is not in acute distress.    Appearance: He is not toxic-appearing.  Eyes:     General: No scleral icterus. Pulmonary:     Effort: Pulmonary effort is normal. No respiratory distress.  Skin:    General: Skin is dry.     Findings: No rash.  Neurological:     General: No focal deficit present.     Mental Status: He is alert. Mental status is at baseline.     GCS: GCS eye subscore is 4. GCS verbal subscore is 5. GCS motor subscore is 6.      Cranial Nerves: No cranial nerve deficit, dysarthria or facial asymmetry.     Sensory: No sensory deficit.     Motor: No weakness or pronator drift.     Comments: She says imaging.  Cranial nerves intact.  No facial droop noted.  Patient is answering questions appropriately with appropriate speech.  No pronator drift.  Strength is intact in upper and lower bilateral extremities.  Sensation reportedly intact and symmetric in bilateral upper and lower extremities, including the face.  Psychiatric:        Mood and Affect: Mood is anxious.        Thought Content: Thought content does not include homicidal or suicidal ideation. Thought content does not include homicidal or suicidal plan.     Comments: Tremulous, pulling at fingers and picking at nails.  Does not appear to be responding to internal stimuli.  Denies any suicidal or homicidal ideations.     ED Results / Procedures / Treatments   Labs (all labs ordered are listed, but only abnormal results are displayed) Labs Reviewed  BASIC METABOLIC PANEL - Abnormal; Notable for the following components:      Result Value   CO2 21 (*)    Glucose, Bld 114 (*)    BUN 24 (*)    Calcium 8.5 (*)    All other components within normal limits  CBG MONITORING, ED - Abnormal; Notable for the following components:   Glucose-Capillary 125 (*)    All other components within normal limits  CBC WITH DIFFERENTIAL/PLATELET    EKG None  Radiology DG Chest 2 View  Result Date: 10/14/2022 CLINICAL DATA:  Shortness of breath EXAM: CHEST - 2 VIEW COMPARISON:  10/08/2022 FINDINGS: Heart and mediastinal contours are within normal limits. No focal opacities or effusions. No acute bony abnormality. Aortic atherosclerosis. IMPRESSION: No active cardiopulmonary disease. Electronically Signed   By: Charlett Nose M.D.   On: 10/14/2022 19:29    Procedures Procedures   Medications Ordered in ED Medications  LORazepam (ATIVAN) tablet 1 mg (1 mg Oral Given 10/15/22  2306)    ED Course/ Medical Decision Making/ A&P                           Medical Decision Making Amount and/or Complexity of Data Reviewed Labs: ordered.  Risk Prescription drug management.   52 y.o. male presents to the ER for evaluation of paresthesias to the full head and face and bilateral arms. Differential diagnosis includes but is not limited to electrolyte abnormality, psych,  complex headache. Vital signs unremarkable.Marland Kitchen Physical exam as noted above.   I independently reviewed and interpreted the patient's labs.  CBG 125.  CBC without cytosis or anemia.  BMP shows mild decreased bicarb 21, glucose 114, BUN of 24 and calcium 8.5.  No other electrolyte abnormality.  On previous chart evaluation, patient was previously seen at behavioral health earlier today with complaints of anxiety and not sleeping well.  He was discharged home with outpatient therapy follow-up.  The patient is already prescribed Lexapro however he is hesitant to take this as he is afraid how or react with his other medications.  He is also been prescribed Ativan however has not been taking it over the past few days.    The patient does not appear to be in any mania.  Fluid speech.  No racing of ideas.  He is adamant that he has not slept in the past 2 days.  He reports he is feeling some better with the Ativan however wants to stay in the ER to see if he can sleep.  This seems to be more anxiety/panic in etiology.  He does have some low calcium in comparison to his previous so she is since yesterday, but I do not think this needs any emergent intervention.  Given his insomnia, will have him follow-up with behavioral health urgent care with safe transport tonight.  Patient is amenable to the plan.  We discussed the results of the labs. The plan is to transport over to behavioral health urgent care. We discussed strict return precautions and red flag symptoms. The patient verbalized their understanding and agrees to the  plan. The patient is stable and being discharged to behavioral health urgent care with safe transport  Portions of this report may have been transcribed using voice recognition software. Every effort was made to ensure accuracy; however, inadvertent computerized transcription errors may be present.    Final Clinical Impression(s) / ED Diagnoses Final diagnoses:  Panic  Paresthesias    Rx / DC Orders ED Discharge Orders     None         Achille Rich, PA-C 10/16/22 0133    Franne Forts, DO 10/19/22 0830

## 2022-10-16 NOTE — ED Provider Notes (Signed)
Behavioral Health Urgent Care Medical Screening Exam  Patient Name: Juan Kline MRN: [id number] Date of Evaluation: 10/16/22 Chief Complaint:  "The hospital sent me here". Diagnosis:  Final diagnoses:  Other insomnia  Anxiety    History of Present illness: Juan Kline is a 52 y.o. male. with a past psychiatric history of anxiety, Panic disorder and insomnia who presented to [facility] voluntarily as a direct transfer from [facility] where he presented earlier yesterday with complaints of chills after he was discharged from the [facility].  Patient was seen face-to-face by this provider and chart reviewed. Per chart review, patient was seen yesterday at the [facility] 10/15/22 due to anxiety.  Patient also presented to [facility] 10/14/22 with complaints of intermittent palpitations and shortness of breath over the past few weeks.  On evaluation, patient is alert, oriented x 4, and cooperative. Speech is clear, normal rate and coherent. Pt appears well groomed. Eye contact is good. Mood is euthymic, affect is congruent with mood. Thought process is coherent/goal directed and thought content is WDL. Pt denies SI/HI/AVH. There is no objective indication that the patient is responding to internal stimuli. No delusions elicited during this assessment.    Patient reports "I been to the ED at [facility], at the time, I had flu-like symptoms, and I've had anxiety attacks in the last 2 weeks and I've had poor sleep for 2 days and they checked my vitals and gave me 1 mg of Ativan and sent me here, I don't know why, but they asked me to get in the [place] and I did".  Patient reports "I've been here before, upstairs, I was trying to get some therapy last month or so, it didn't work out, and I had my provider refer me somewhere else, but I've been having poor sleep,and anxiety, and I felt like I had muscle tension, hot flashes, sensitive hearing, and flu-like symptoms and I was tired and couldn't take it anymore, and I've been trying to do the regular  hospitals where I live at [place], but they wouldn't do anything for me, that's why I came here, I'm trying different ED's and this one recognized the anxiety and finally gave me something to treat it, but I do have some Ativan at home from a long time ago, which I don't take regularly".  Patient reports that he lives with his stepfather and mother.  He denies the presence or access to a gun.  He denies illicit substance use.  He reports feeling safe returning home tonight as he is currently sleepy, and will call somebody to come pick him up.  Patient reports he is aware of the [facility] walk-in clinic and will return for medication management with his outpatient provider.  Support, encouragement, reassurance provided about ongoing stressors.  Patient is provided with opportunity for questions.  Discussed recommendation for discharge and follow-up with the [facility] outpatient walk-in clinic for medication management.  Patient is provided with resources and encouraged to follow-up.  Patient is in agreement.  Flowsheet Row ED from 10/16/2022 in [facility] Most recent reading at 10/16/2022  2:14 AM ED from 10/15/2022 in [facility] Emergency Department at [facility] Most recent reading at 10/15/2022  7:45 PM ED from 10/15/2022 in [facility] Most recent reading at 10/15/2022  3:07 PM  C-SSRS RISK CATEGORY No Risk No Risk No Risk       Psychiatric Specialty Exam  Presentation  General Appearance:Appropriate for Environment  Eye Contact:Good  Speech:Clear and  Coherent  Speech Volume:Normal  Handedness:Right   Mood and Affect  Mood: Euthymic  Affect: Congruent   Thought Process  Thought Processes: Coherent  Descriptions of Associations:Intact  Orientation:Full (Time, Place and Person)  Thought Content:WDL    Hallucinations:None  Ideas of Reference:None  Suicidal Thoughts:No  Homicidal Thoughts:No   Sensorium   Memory: Immediate Good  Judgment: Intact  Insight: Present   Executive Functions  Concentration: Good  Attention Span: Good  Recall: Good  Fund of Knowledge: Good  Language: Good   Psychomotor Activity  Psychomotor Activity: Normal   Assets  Assets: Communication Skills; Desire for Improvement   Sleep  Sleep: Poor  Number of hours:  0 (Patient states he hasn't slept in 3 days)   Physical Exam: Physical Exam Constitutional:      General: He is not in acute distress.    Appearance: He is not diaphoretic.  HENT:     Head: Normocephalic.     Right Ear: External ear normal.     Left Ear: External ear normal.     Nose: No congestion.  Eyes:     General:        Right eye: No discharge.        Left eye: No discharge.  Cardiovascular:     Rate and Rhythm: Normal rate.  Pulmonary:     Effort: No respiratory distress.  Chest:     Chest wall: No tenderness.  Neurological:     Mental Status: He is alert and oriented to person, place, and time.  Psychiatric:        Attention and Perception: Attention and perception normal.        Mood and Affect: Mood and affect normal.        Speech: Speech normal.        Behavior: Behavior is cooperative.        Thought Content: Thought content normal. Thought content is not paranoid or delusional. Thought content does not include homicidal or suicidal ideation. Thought content does not include homicidal or suicidal plan.        Cognition and Memory: Cognition and memory normal.    Review of Systems  Constitutional:  Negative for chills, diaphoresis and fever.  HENT:  Negative for congestion.   Eyes:  Negative for discharge.  Respiratory:  Negative for cough, shortness of breath and wheezing.   Cardiovascular:  Negative for chest pain and palpitations.  Gastrointestinal:  Negative for diarrhea, nausea and vomiting.  Neurological:  Negative for dizziness, seizures, loss of consciousness, weakness and headaches.   Psychiatric/Behavioral:  The patient has insomnia.    Blood pressure (!) 121/98, pulse 89, temperature 97.7 F (36.5 C), temperature source Oral, resp. rate 18, SpO2 96 %. There is no height or weight on file to calculate BMI.  Musculoskeletal: Strength & Muscle Tone: within normal limits Gait & Station: normal Patient leans: N/A   [facility] MSE Discharge Disposition for Follow up and Recommendations: Based on my evaluation the patient does not appear to have an emergency medical condition and can be discharged with resources and follow up care in outpatient services for Medication Management  Recommend discharge home and follow-up with outpatient psychiatric services for medication management.  Patient provided with [facility] patient walk-in clinic resources.  Patient does not meet inpatient psychiatric admission criteria or IVC criteria at this time.  There is no evidence of imminent risk of harm to self or others.  Discharge recommendations:  Patient is to take medications as prescribed. Please  see information for follow-up appointment with psychiatry and therapy. Please follow up with your primary care provider for all medical related needs.   Medications: The patient or guardian should update outpatient providers of any new medications and/or medication changes. .  Safety:  The patient should abstain from use of illicit substances/drugs and abuse of any medications. If symptoms worsen or do not continue to improve or if the patient becomes actively suicidal or homicidal then it is recommended that the patient return to the closest hospital emergency department, the [facility], or call 911 for further evaluation and treatment. National Suicide Prevention Lifeline 1-800-SUICIDE or 816-460-0256.  About 988 988 offers 24/7 access to trained crisis counselors who can help people experiencing mental health-related distress. People can call or text 988 or chat  988lifeline.org for themselves or if they are worried about a loved one who may need crisis support.  Crisis Mobile: [facility]:                     (707)447-8173 (for crisis response 24 hours a day) [facility] Hotline:                                            4181058046   Patient discharged and condition at discharge is stable.  Mancel Bale, NP 10/16/2022, 2:48 AM

## 2022-10-16 NOTE — Progress Notes (Addendum)
   10/16/22 0150  [facility] Triage Screening (Walk-ins at [facility] only)  How Did You Hear About [name]? Self  How Long Has This Been Causing You Problems? 1 wk - 1 month  Have You Recently Had Any Thoughts About Hurting Yourself? No  Are You Planning to Commit Suicide/Harm Yourself At This time? No  Have you Recently Had Thoughts About Hurting Someone Karolee Ohs? No  Are You Planning To Harm Someone At This Time? No  Are you currently experiencing any auditory, visual or other hallucinations? No  Have You Used Any Alcohol or Drugs in the Past 24 Hours? No  Do you have any current medical co-morbidities that require immediate attention? No  Clinician description of patient physical appearance/behavior: calm / cooperative  What Do You Feel Would Help You the Most Today? Treatment for Depression or other mood problem;Medication(s);Stress Management  If access to [facility] Urgent Care was not available, would you have sought care in the Emergency Department? Yes  Determination of Need Routine (7 days)  Options For Referral Medication Management;Outpatient Therapy    Flowsheet Row ED from 10/16/2022 in [facility] Most recent reading at 10/16/2022  2:14 AM ED from 10/15/2022 in [facility] Emergency Department at [facility] Most recent reading at 10/15/2022  7:45 PM ED from 10/15/2022 in [facility] Most recent reading at 10/15/2022  3:07 PM  C-SSRS RISK CATEGORY No Risk No Risk No Risk

## 2022-10-20 NOTE — Progress Notes (Deleted)
Cardiology Clinic Note   Patient Name: Juan Kline Date of Encounter: 10/20/2022  Primary Care Provider:  Claiborne Rigg, NP Primary Cardiologist:  Chrystie Nose, MD  Patient Profile    Juan Kline 52 year old male presents the clinic today for follow-up evaluation of his palpitations and shortness of breath.  Past Medical History    Past Medical History:  Diagnosis Date   Aortic atherosclerosis (HCC)    Chronic foot ulcer (HCC) admitted 03/02/2014   medial border first metatarsal left foot  - resolved per patient 03/20/20   Diabetes (HCC)    type 2 - no meds, diet controlled   Diabetic foot infection (HCC)    First degree AV block    GAD (generalized anxiety disorder)    GERD (gastroesophageal reflux disease)    Hx of migraines    Hyperlipidemia    Hypertension Dx 2009   Mild depression    Osteomyelitis left first metatarsal 03/02/2014   Sleep apnea    not using CPAP- machine being looked at for repair 03/21/20    Venous stasis ulcer of left lower extremity (HCC)    Juan Kline 03/02/2014 - resolved per patient 03/20/20   Past Surgical History:  Procedure Laterality Date   BIOPSY  08/18/2019   Procedure: BIOPSY;  Surgeon: Tressia Danas, MD;  Location: [facility];  Service: Gastroenterology;;   ESOPHAGOGASTRODUODENOSCOPY (EGD) WITH PROPOFOL N/A 08/18/2019   Procedure: ESOPHAGOGASTRODUODENOSCOPY (EGD) WITH PROPOFOL;  Surgeon: Tressia Danas, MD;  Location: [facility];  Service: Gastroenterology;  Laterality: N/A;    Allergies  Allergies  Allergen Reactions   Pregabalin Anxiety and Swelling    History of Present Illness    Juan Kline has a PMH of hypertension, generalized anxiety disorder, palpitations, depression, and shortness of breath.  He presented to the emergency department on 10/08/2022 and was discharged on 10/10/2022.  He reported episodes of intermittent facial numbness, numbness in his hands and palpitations which have been ongoing for years.  He  indicated that he had taken his blood pressure and noted it was high.  He reported that he was trying to eat better.  He noted that his anxiety was bothering him.  He had a virtual visit scheduled with psychiatry.  He had been taking Ativan which was helping with his symptoms.  He noted that he was feeling short of breath which was common for him.  He felt that it was hard for him to get sleep.  He noted that he was sensitive to noise and light.  He denied alcohol use.  He denied chest pain, lower extremity swelling and orthopnea.  He was felt to be hemodynamically stable for discharge and instructed to follow-up with psychiatry.  He reached out to cardiology via MyChart with an EKG that showed an irregularly irregular rhythm.  He was instructed to schedule follow-up.  [name]  *** denies chest pain, shortness of breath, lower extremity edema, fatigue, palpitations, melena, hematuria, hemoptysis, diaphoresis, weakness, presyncope, syncope, orthopnea, and PND.  [name]  Presented to the emergency department with shortness of breath, facial numbness, and hand numbness.  Consecutive EKG showed sinus rhythm with first-degree AV block. Avoid triggers caffeine, chocolate, EtOH, dehydration etc. Continue metoprolol  Shortness of breath, COPD-chronic.  Stable. Increase physical activity as tolerated Follows with PCP  Essential hypertension-BP today***. Continue amlodipine, metoprolol Maintain blood pressure log Heart healthy low-sodium diet  Hyperlipidemia-LDL*** High-fiber diet Continue  atorvastatin, fenofibrate, aspirin Follows with PCP  Disposition: Follow-up with Dr. Rennis Golden or me in 6 months.   Home Medications    Prior to Admission medications   Medication Sig Start Date End Date Taking? Authorizing Provider  Accu-Chek Softclix Lancets lancets Check blood  glucose level by fingerstick once per day.  E11.65 06/17/22   Claiborne Rigg, NP  amLODipine (NORVASC) 10 MG tablet Take 1 tablet (10 mg total) by mouth daily. 09/16/22   Claiborne Rigg, NP  aspirin 81 MG EC tablet Take 1 tablet (81 mg total) by mouth daily. Swallow whole. 01/05/21   Dorcas Carrow, MD  atorvastatin (LIPITOR) 40 MG tablet Take 1.5 tablets (60 mg total) by mouth daily. 09/03/22   Claiborne Rigg, NP  escitalopram (LEXAPRO) 10 MG tablet Take 0.5 tablets (5 mg total) by mouth at bedtime for 6 days, THEN 1 tablet (10 mg total) at bedtime. 10/10/22 11/17/22    fenofibrate (TRICOR) 145 MG tablet Take 1 tablet (145 mg total) by mouth daily. 09/16/22   Claiborne Rigg, NP  glucose blood (ACCU-CHEK GUIDE) test strip Use as instructed. Check blood glucose by fingerstick twice per day. 06/17/22   Claiborne Rigg, NP  losartan (COZAAR) 100 MG tablet Take 1 tablet (100 mg total) by mouth daily. 09/16/22   Claiborne Rigg, NP  Magnesium 400 MG CAPS Take 400 mg by mouth daily.    [provider]  meloxicam (MOBIC) 15 MG tablet Take 1 tablet (15 mg total) by mouth daily. 08/24/22     metoprolol tartrate (LOPRESSOR) 25 MG tablet Take 1 tablet (25 mg total) by mouth 2 (two) times daily as needed (PALPATATIONS). 07/09/22   Sheilah Pigeon, PA-C  omeprazole (PRILOSEC) 20 MG capsule Take 1 capsule (20 mg total) by mouth 2 (two) times daily before a meal. 02/28/22   Claiborne Rigg, NP    Family History    Family History  Problem Relation Age of Onset   Hypertension Mother    Hyperlipidemia Mother    Diabetes Mother    Kidney disease Mother    Hypertension Father    Hyperlipidemia Father    Diabetes Brother    Hypertension Brother    Hyperlipidemia Brother    Heart disease Maternal Grandmother    COPD Maternal Grandmother    Diabetes Maternal Grandfather    Heart attack Paternal Grandmother    Heart disease Paternal Grandfather    Colon cancer Neg Hx    Colon polyps Neg Hx    Stomach  cancer Neg Hx    Esophageal cancer Neg Hx    Ulcerative colitis Neg Hx    He indicated that his mother is alive. He indicated that his father is alive. He indicated that his sister is alive. He indicated that his brother is alive. He indicated that his maternal grandmother is deceased. He indicated that his maternal grandfather is deceased. He indicated that his paternal grandmother is deceased. He indicated that his paternal grandfather is deceased. He indicated that the status of his neg hx is unknown.  Social History    Social History   Socioeconomic History   Marital status: Single    Spouse name: Not on file   Number of children: 0   Years of education: Not on file   Highest education level: 9th grade  Occupational History   Not on file  Tobacco Use   Smoking status: Former    Packs/day: 1.50    Years: 26.00  Additional pack years: 0.00    Total pack years: 39.00    Types: E-cigarettes, Cigarettes    Start date: 1989    Quit date: 02/16/2014    Years since quitting: 8.6   Smokeless tobacco: Never   Tobacco comments:    occasional vapes - no nicotine  Vaping Use   Vaping Use: Former   Start date: 04/15/2015   Quit date: 04/11/2019   Substances: Nicotine  Substance and Sexual Activity   Alcohol use: Not Currently    Comment: None 03/28/21, 03/02/2014 "I rarely drink"   Drug use: No   Sexual activity: Not Currently  Other Topics Concern   Not on file  Social History Narrative   Pt lives in 1 story home with his mother and step father   9th grade education   Last occupation was with [employer] in 2009      Unemployed at this time- thinking of filing for disability      Right handed      Social Determinants of Health   Financial Resource Strain: Patient Declined (09/12/2022)   Overall Financial Resource Strain (CARDIA)    Difficulty of Paying Living Expenses: Patient declined  Food Insecurity: No Food Insecurity (09/12/2022)   Hunger Vital Sign    Worried About  Running Out of Food in the Last Year: Never true    Ran Out of Food in the Last Year: Never true  Transportation Needs: No Transportation Needs (09/12/2022)   PRAPARE - Administrator, Civil Service (Medical): No    Lack of Transportation (Non-Medical): No  Physical Activity: Unknown (09/12/2022)   Exercise Vital Sign    Days of Exercise per Week: 0 days    Minutes of Exercise per Session: Not on file  Stress: Stress Concern Present (09/12/2022)   [employer] of Occupational Health - Occupational Stress Questionnaire    Feeling of Stress : Very much  Social Connections: Socially Isolated (09/12/2022)   Social Connection and Isolation Panel [NHANES]    Frequency of Communication with Friends and Family: Once a week    Frequency of Social Gatherings with Friends and Family: Never    Attends Religious Services: Never    Database administrator or Organizations: No    Attends Engineer, structural: Not on file    Marital Status: Never married  Intimate Partner Violence: Not on file     Review of Systems    General:  No chills, fever, night sweats or weight changes.  Cardiovascular:  No chest pain, dyspnea on exertion, edema, orthopnea, palpitations, paroxysmal nocturnal dyspnea. Dermatological: No rash, lesions/masses Respiratory: No cough, dyspnea Urologic: No hematuria, dysuria Abdominal:   No nausea, vomiting, diarrhea, bright red blood per rectum, melena, or hematemesis Neurologic:  No visual changes, wkns, changes in mental status. All other systems reviewed and are otherwise negative except as noted above.  Physical Exam    VS:  There were no vitals taken for this visit. , BMI There is no height or weight on file to calculate BMI. GEN: Well nourished, well developed, in no acute distress. HEENT: normal. Neck: Supple, no JVD, carotid bruits, or masses. Cardiac: RRR, no murmurs, rubs, or gallops. No clubbing, cyanosis, edema.  Radials/DP/PT 2+ and equal  bilaterally.  Respiratory:  Respirations regular and unlabored, clear to auscultation bilaterally. GI: Soft, nontender, nondistended, BS + x 4. MS: no deformity or atrophy. Skin: warm and dry, no rash. Neuro:  Strength and sensation are intact. Psych: Normal affect.  Accessory Clinical Findings    Recent Labs: 11/18/2021: TSH 2.310 09/16/2022: ALT 27 10/15/2022: BUN 24; Creatinine, Ser 0.89; Hemoglobin 13.6; Platelets 316; Potassium 3.6; Sodium 135   Recent Lipid Panel    Component Value Date/Time   CHOL 182 02/28/2022 1146   TRIG 142 02/28/2022 1146   HDL 49 02/28/2022 1146   CHOLHDL 3.7 02/28/2022 1146   CHOLHDL 4 03/28/2021 1031   VLDL 35.8 03/28/2021 1031   LDLCALC 108 (H) 02/28/2022 1146    No BP recorded.  {Refresh Note OR Click here to enter BP  :1}***    ECG personally reviewed by me today- *** - No acute changes  Echocardiogram 01/04/2021  IMPRESSIONS     1. Left ventricular ejection fraction, by estimation, is 60 to 65%. The  left ventricle has normal function. The left ventricle has no regional  wall motion abnormalities. There is mild concentric left ventricular  hypertrophy. Indeterminate diastolic  filling due to E-A fusion.   2. Right ventricular systolic function is low normal. The right  ventricular size is mildly enlarged. Tricuspid regurgitation signal is  inadequate for assessing PA pressure.   3. Left atrial size was mildly dilated.   4. Right atrial size was mildly dilated.   5. The mitral valve is grossly normal. No evidence of mitral valve  regurgitation. No evidence of mitral stenosis. Moderate mitral annular  calcification.   6. The aortic valve was not well visualized. Aortic valve regurgitation  is not visualized. No aortic stenosis is present.   7. The inferior vena cava is normal in size with greater than 50%  respiratory variability, suggesting right atrial pressure of 3 mmHg.   Comparison(s): Prior images reviewed side by side.    Conclusion(s)/Recommendation(s): Otherwise normal echocardiogram, with  minor abnormalities described in the report.   FINDINGS   Left Ventricle: Left ventricular ejection fraction, by estimation, is 60  to 65%. The left ventricle has normal function. The left ventricle has no  regional wall motion abnormalities. Definity contrast agent was given IV  to delineate the left ventricular   endocardial borders. The left ventricular internal cavity size was normal  in size. There is mild concentric left ventricular hypertrophy.  Indeterminate diastolic filling due to E-A fusion.   Right Ventricle: The right ventricular size is mildly enlarged. Right  vetricular wall thickness was not well visualized. Right ventricular  systolic function is low normal. Tricuspid regurgitation signal is  inadequate for assessing PA pressure.   Left Atrium: Left atrial size was mildly dilated.   Right Atrium: Right atrial size was mildly dilated.   Pericardium: Trivial pericardial effusion is present. Presence of  pericardial fat pad.   Mitral Valve: The mitral valve is grossly normal. Moderate mitral annular  calcification. No evidence of mitral valve regurgitation. No evidence of  mitral valve stenosis.   Tricuspid Valve: The tricuspid valve is grossly normal. Tricuspid valve  regurgitation is not demonstrated. No evidence of tricuspid stenosis.   Aortic Valve: The aortic valve was not well visualized. Aortic valve  regurgitation is not visualized. No aortic stenosis is present. Aortic  valve mean gradient measures 4.0 mmHg. Aortic valve peak gradient measures  7.8 mmHg. Aortic valve area, by VTI  measures 1.61 cm.   Pulmonic Valve: The pulmonic valve was not well visualized. Pulmonic valve  regurgitation is not visualized. No evidence of pulmonic stenosis.   Aorta: The aortic root and ascending aorta are structurally normal, with  no evidence of dilitation.   Venous: The  inferior vena cava  is normal in size with greater than 50%  respiratory variability, suggesting right atrial pressure of 3 mmHg.   IAS/Shunts: The atrial septum is grossly normal.    Nuclear stress test 04/26/2021    The study is normal. The study is low risk.   No ST deviation was noted.   LV perfusion is normal. There is no evidence of ischemia. There is no evidence of infarction.   Left ventricular function is normal. Nuclear stress EF: 55 %. The left ventricular ejection fraction is normal (55-65%). End diastolic cavity size is normal.   Prior study not available for comparison.   Reduced apical counts with normal wall motion consistent with apical thinning artifact.  Normal study without ischemia or infarction.  Normal LVEF=55%.  This is a low-risk study.   Assessment & Plan   1.  ***   Thomasene Ripple.  NP-C     10/20/2022, 10:48 AM [facility] [address] Office [phone] Fax [phone]    I spent***minutes examining this patient, reviewing medications, and using patient centered shared decision making involving her cardiac care.  Prior to her visit I spent greater than 20 minutes reviewing her past medical history,  medications, and prior cardiac tests.

## 2022-10-22 ENCOUNTER — Other Ambulatory Visit: Payer: Self-pay

## 2022-10-22 ENCOUNTER — Other Ambulatory Visit (HOSPITAL_COMMUNITY): Payer: Self-pay

## 2022-10-22 MED ORDER — TRAZODONE HCL 50 MG PO TABS
50.0000 mg | ORAL_TABLET | Freq: Every day | ORAL | 0 refills | Status: DC
  Filled 2022-10-22 (×2): qty 30, 30d supply, fill #0

## 2022-10-23 ENCOUNTER — Ambulatory Visit: Payer: Medicaid Other | Admitting: General Practice

## 2022-10-29 ENCOUNTER — Other Ambulatory Visit (HOSPITAL_COMMUNITY): Payer: Self-pay

## 2022-10-29 ENCOUNTER — Other Ambulatory Visit: Payer: Self-pay

## 2022-10-29 MED ORDER — ESCITALOPRAM OXALATE 20 MG PO TABS
20.0000 mg | ORAL_TABLET | Freq: Every day | ORAL | 0 refills | Status: DC
  Filled 2022-10-29: qty 30, 30d supply, fill #0

## 2022-11-05 ENCOUNTER — Other Ambulatory Visit (HOSPITAL_COMMUNITY): Payer: Self-pay

## 2022-12-01 ENCOUNTER — Other Ambulatory Visit: Payer: Self-pay

## 2022-12-01 ENCOUNTER — Other Ambulatory Visit (HOSPITAL_COMMUNITY): Payer: Self-pay

## 2022-12-01 ENCOUNTER — Encounter (HOSPITAL_COMMUNITY): Payer: Self-pay

## 2022-12-03 ENCOUNTER — Other Ambulatory Visit: Payer: Self-pay

## 2022-12-03 ENCOUNTER — Other Ambulatory Visit (HOSPITAL_COMMUNITY): Payer: Self-pay

## 2022-12-03 MED ORDER — QUETIAPINE FUMARATE 25 MG PO TABS
25.0000 mg | ORAL_TABLET | Freq: Every day | ORAL | 0 refills | Status: DC
  Filled 2022-12-03: qty 90, 90d supply, fill #0

## 2022-12-03 MED ORDER — ESCITALOPRAM OXALATE 10 MG PO TABS
10.0000 mg | ORAL_TABLET | Freq: Every day | ORAL | 0 refills | Status: DC
  Filled 2022-12-03: qty 90, 90d supply, fill #0

## 2022-12-17 ENCOUNTER — Ambulatory Visit: Payer: Medicaid Other | Admitting: Nurse Practitioner

## 2022-12-20 ENCOUNTER — Other Ambulatory Visit (HOSPITAL_COMMUNITY): Payer: Self-pay

## 2022-12-21 ENCOUNTER — Other Ambulatory Visit (HOSPITAL_COMMUNITY): Payer: Self-pay

## 2023-02-04 ENCOUNTER — Other Ambulatory Visit: Payer: Self-pay

## 2023-02-09 ENCOUNTER — Ambulatory Visit: Payer: Medicaid Other | Admitting: Nurse Practitioner

## 2023-02-12 ENCOUNTER — Other Ambulatory Visit: Payer: Self-pay

## 2023-02-12 MED ORDER — QUETIAPINE FUMARATE 25 MG PO TABS
25.0000 mg | ORAL_TABLET | Freq: Every day | ORAL | 0 refills | Status: DC
  Filled 2023-02-26: qty 90, 90d supply, fill #0

## 2023-02-12 MED ORDER — ESCITALOPRAM OXALATE 10 MG PO TABS
15.0000 mg | ORAL_TABLET | Freq: Every day | ORAL | 0 refills | Status: DC
  Filled 2023-02-12: qty 135, 90d supply, fill #0

## 2023-02-13 ENCOUNTER — Other Ambulatory Visit: Payer: Self-pay

## 2023-02-26 ENCOUNTER — Other Ambulatory Visit: Payer: Self-pay

## 2023-02-26 ENCOUNTER — Other Ambulatory Visit (HOSPITAL_COMMUNITY): Payer: Self-pay

## 2023-03-02 ENCOUNTER — Other Ambulatory Visit: Payer: Self-pay

## 2023-03-06 ENCOUNTER — Other Ambulatory Visit: Payer: Self-pay

## 2023-03-06 ENCOUNTER — Ambulatory Visit: Payer: MEDICAID | Attending: Nurse Practitioner | Admitting: Nurse Practitioner

## 2023-03-06 ENCOUNTER — Encounter: Payer: Self-pay | Admitting: Nurse Practitioner

## 2023-03-06 VITALS — BP 124/75 | HR 71 | Ht 69.0 in | Wt 345.0 lb

## 2023-03-06 DIAGNOSIS — E785 Hyperlipidemia, unspecified: Secondary | ICD-10-CM | POA: Diagnosis not present

## 2023-03-06 DIAGNOSIS — I10 Essential (primary) hypertension: Secondary | ICD-10-CM

## 2023-03-06 DIAGNOSIS — Z23 Encounter for immunization: Secondary | ICD-10-CM | POA: Diagnosis not present

## 2023-03-06 DIAGNOSIS — R790 Abnormal level of blood mineral: Secondary | ICD-10-CM

## 2023-03-06 DIAGNOSIS — E1142 Type 2 diabetes mellitus with diabetic polyneuropathy: Secondary | ICD-10-CM

## 2023-03-06 DIAGNOSIS — E7849 Other hyperlipidemia: Secondary | ICD-10-CM

## 2023-03-06 MED ORDER — LOSARTAN POTASSIUM 100 MG PO TABS
100.0000 mg | ORAL_TABLET | Freq: Every day | ORAL | 1 refills | Status: DC
  Filled 2023-03-06: qty 90, 90d supply, fill #0
  Filled 2023-06-15: qty 90, 90d supply, fill #1

## 2023-03-06 MED ORDER — FENOFIBRATE 145 MG PO TABS
145.0000 mg | ORAL_TABLET | Freq: Every day | ORAL | 3 refills | Status: DC
  Filled 2023-03-06 – 2023-05-05 (×2): qty 90, 90d supply, fill #0
  Filled 2023-07-29: qty 90, 90d supply, fill #1

## 2023-03-06 MED ORDER — AMLODIPINE BESYLATE 10 MG PO TABS
10.0000 mg | ORAL_TABLET | Freq: Every day | ORAL | 1 refills | Status: DC
  Filled 2023-03-06 – 2023-05-26 (×2): qty 90, 90d supply, fill #0
  Filled 2023-08-24: qty 90, 90d supply, fill #1

## 2023-03-06 MED ORDER — ATORVASTATIN CALCIUM 40 MG PO TABS
60.0000 mg | ORAL_TABLET | Freq: Every day | ORAL | 1 refills | Status: DC
  Filled 2023-03-06 – 2023-03-19 (×2): qty 135, 90d supply, fill #0
  Filled 2023-06-15: qty 135, 90d supply, fill #1

## 2023-03-06 NOTE — Progress Notes (Signed)
Assessment & Plan:  Juan "[name]" was seen today for medical management of chronic issues.  Diagnoses and all orders for this visit:  Type 2 diabetes mellitus with diabetic polyneuropathy, without long-term current use of insulin (HCC) -     Hemoglobin A1c -     CMP14+EGFR -     POCT glucose (manual entry) -     Ambulatory referral to Ophthalmology -     atorvastatin (LIPITOR) 40 MG tablet; Take 1.5 tablets (60 mg total) by mouth daily for cholesterol Continue blood sugar control as discussed in office today, low carbohydrate diet, and regular physical exercise as tolerated, 150 minutes per week (30 min each day, 5 days per week, or 50 min 3 days per week). Keep blood sugar logs with fasting goal of 90-130 mg/dl, post prandial (after you eat) less than 180.  For Hypoglycemia: BS <60 and Hyperglycemia BS >400; contact the clinic ASAP. Annual eye exams and foot exams are recommended.   Dyslipidemia, goal LDL below 70 -     Lipid panel -     fenofibrate (TRICOR) 145 MG tablet; Take 1 tablet (145 mg total) by mouth daily. INSTRUCTIONS: Work on a low fat, heart healthy diet and participate in regular aerobic exercise program by working out at least 150 minutes per week; 5 days a week-30 minutes per day. Avoid red meat/beef/steak,  fried foods. junk foods, sodas, sugary drinks, unhealthy snacking, alcohol and smoking.  Drink at least 80 oz of water per day and monitor your carbohydrate intake daily.    Primary hypertension -     amLODipine (NORVASC) 10 MG tablet; Take 1 tablet (10 mg total) by mouth daily. -     losartan (COZAAR) 100 MG tablet; Take 1 tablet (100 mg total) by mouth daily. Continue all antihypertensives as prescribed.  Reminded to bring in blood pressure log for follow  up appointment.  RECOMMENDATIONS: DASH/Mediterranean Diets are healthier choices for HTN.    Low magnesium level -     Magnesium  Encounter for immunization -     Flu vaccine trivalent PF, 6mos and  older(Flulaval,Afluria,Fluarix,Fluzone)    Patient has been counseled on age-appropriate routine health concerns for screening and prevention. These are reviewed and up-to-date. Referrals have been placed accordingly. Immunizations are up-to-date or declined.    Subjective:   Chief Complaint  Patient presents with   Medical Management of Chronic Issues    Juan Kline 52 y.o. male presents to office today for follow up to HTN  He has a past medical history of Aortic atherosclerosis, Chronic foot ulcer (admitted 03/02/2014), DM2, Diabetic foot infection, First degree AV block, GAD, GERD, migraines, Hyperlipidemia, Hypertension (Dx 2009), Mild depression, Osteomyelitis left first metatarsal (03/02/2014), Sleep apnea, and Venous stasis ulcer of left lower extremity    HTN Blood pressure is well controlled. He is taking prescribed amlodipine 10 mg daily and losartan 100 mg daily and metoprolol 25 mg twice daily as needed for palpitations  BP Readings from Last 3 Encounters:  03/06/23 124/75  10/16/22 (!) 141/94  10/14/22 113/62    LDL not at goal of <70.  He is currently prescribed Tricor and atorvastatin. Lab Results  Component Value Date   LDLCALC 96 03/06/2023    DM 2 Diabetes is not well controlled. A1c up from 6.6 to 7.3. Weight is also gradually increasing. Will restart low dose metformin.  Lab Results  Component Value Date   HGBA1C 7.3 (H) 03/06/2023    Review of Systems  Constitutional:  Negative for fever, malaise/fatigue and weight loss.  HENT: Negative.  Negative for nosebleeds.   Eyes: Negative.  Negative for blurred vision, double vision and photophobia.  Respiratory: Negative.  Negative for cough and shortness of breath.   Cardiovascular: Negative.  Negative for chest pain, palpitations and leg swelling.  Gastrointestinal: Negative.  Negative for heartburn, nausea and vomiting.  Musculoskeletal: Negative.  Negative for myalgias.  Neurological: Negative.  Negative  for dizziness, focal weakness, seizures and headaches.  Psychiatric/Behavioral: Negative.  Negative for suicidal ideas.     Past Medical History:  Diagnosis Date   Aortic atherosclerosis (HCC)    Chronic foot ulcer (HCC) admitted 03/02/2014   medial border first metatarsal left foot  - resolved per patient 03/20/20   Diabetes (HCC)    type 2 - no meds, diet controlled   Diabetic foot infection (HCC)    First degree AV block    GAD (generalized anxiety disorder)    GERD (gastroesophageal reflux disease)    Hx of migraines    Hyperlipidemia    Hypertension Dx 2009   Mild depression    Osteomyelitis left first metatarsal 03/02/2014   Sleep apnea    not using CPAP- machine being looked at for repair 03/21/20    Venous stasis ulcer of left lower extremity (HCC)    [name] 03/02/2014 - resolved per patient 03/20/20    Past Surgical History:  Procedure Laterality Date   BIOPSY  08/18/2019   Procedure: BIOPSY;  Surgeon: Tressia Danas, MD;  Location: [facility];  Service: Gastroenterology;;   ESOPHAGOGASTRODUODENOSCOPY (EGD) WITH PROPOFOL N/A 08/18/2019   Procedure: ESOPHAGOGASTRODUODENOSCOPY (EGD) WITH PROPOFOL;  Surgeon: Tressia Danas, MD;  Location: [facility];  Service: Gastroenterology;  Laterality: N/A;    Family History  Problem Relation Age of Onset   Hypertension Mother    Hyperlipidemia Mother    Diabetes Mother    Kidney disease Mother    Hypertension Father    Hyperlipidemia Father    Diabetes Brother    Hypertension Brother    Hyperlipidemia Brother    Heart disease Maternal Grandmother    COPD Maternal Grandmother    Diabetes Maternal Grandfather    Heart attack Paternal Grandmother    Heart disease Paternal Grandfather    Colon cancer Neg Hx    Colon polyps Neg Hx    Stomach cancer Neg Hx    Esophageal cancer Neg Hx    Ulcerative colitis Neg Hx     Social History Reviewed with no changes to be made today.   Outpatient Medications Prior to Visit   Medication Sig Dispense Refill   Accu-Chek Softclix Lancets lancets Check blood glucose level by fingerstick once per day.  E11.65 100 each 12   aspirin 81 MG EC tablet Take 1 tablet (81 mg total) by mouth daily. Swallow whole. 30 tablet 11   escitalopram (LEXAPRO) 10 MG tablet Take 1.5 tablets (15 mg total) by mouth at bedtime. 135 tablet 0   glucose blood (ACCU-CHEK GUIDE) test strip Use as instructed. Check blood glucose by fingerstick twice per day. 100 each 12   Magnesium 400 MG CAPS Take 400 mg by mouth daily.     QUEtiapine (SEROQUEL) 25 MG tablet Take 1 tablet (25 mg total) by mouth at bedtime. 90 tablet 0   amLODipine (NORVASC) 10 MG tablet Take 1 tablet (10 mg total) by mouth daily. 90 tablet 1   atorvastatin (LIPITOR) 40 MG tablet Take 1.5 tablets (60 mg total) by mouth daily.  135 tablet 1   fenofibrate (TRICOR) 145 MG tablet Take 1 tablet (145 mg total) by mouth daily. 90 tablet 3   losartan (COZAAR) 100 MG tablet Take 1 tablet (100 mg total) by mouth daily. 90 tablet 1   omeprazole (PRILOSEC) 20 MG capsule Take 1 capsule (20 mg total) by mouth 2 (two) times daily before a meal. 180 capsule 1   metoprolol tartrate (LOPRESSOR) 25 MG tablet Take 1 tablet (25 mg total) by mouth 2 (two) times daily as needed (PALPATATIONS). (Patient not taking: Reported on 03/06/2023) 60 tablet 0   escitalopram (LEXAPRO) 10 MG tablet Take 0.5 tablets (5 mg total) by mouth at bedtime for 6 days, THEN 1 tablet (10 mg total) at bedtime. 30 tablet 0   escitalopram (LEXAPRO) 20 MG tablet Take 1 tablet (20 mg total) by mouth daily. 30 tablet 0   meloxicam (MOBIC) 15 MG tablet Take 1 tablet (15 mg total) by mouth daily. 30 tablet 0   traZODone (DESYREL) 50 MG tablet Take 1 tablet (50 mg total) by mouth at bedtime. 30 tablet 0   No facility-administered medications prior to visit.    Allergies  Allergen Reactions   Pregabalin Anxiety and Swelling       Objective:    BP 124/75 (BP Location: Left Arm,  Patient Position: Sitting, Cuff Size: Normal)   Pulse 71   Ht 5\' 9"  (1.753 m)   Wt (!) 345 lb (156.5 kg)   SpO2 95%   BMI 50.95 kg/m  Wt Readings from Last 3 Encounters:  03/06/23 (!) 345 lb (156.5 kg)  10/15/22 (!) 325 lb (147.4 kg)  10/14/22 (!) 334 lb (151.5 kg)    Physical Exam Vitals and nursing note reviewed.  Constitutional:      Appearance: He is well-developed.  HENT:     Head: Normocephalic and atraumatic.  Cardiovascular:     Rate and Rhythm: Normal rate and regular rhythm.     Heart sounds: Normal heart sounds. No murmur heard.    No friction rub. No gallop.  Pulmonary:     Effort: Pulmonary effort is normal. No tachypnea or respiratory distress.     Breath sounds: Normal breath sounds. No decreased breath sounds, wheezing, rhonchi or rales.  Chest:     Chest wall: No tenderness.  Abdominal:     General: Bowel sounds are normal.     Palpations: Abdomen is soft.  Musculoskeletal:        General: Normal range of motion.     Cervical back: Normal range of motion.  Skin:    General: Skin is warm and dry.  Neurological:     Mental Status: He is alert and oriented to person, place, and time.     Coordination: Coordination normal.  Psychiatric:        Behavior: Behavior normal. Behavior is cooperative.        Thought Content: Thought content normal.        Judgment: Judgment normal.          Patient has been counseled extensively about nutrition and exercise as well as the importance of adherence with medications and regular follow-up. The patient was given clear instructions to go to ER or return to medical center if symptoms don't improve, worsen or new problems develop. The patient verbalized understanding.   Follow-up: Return in about 6 months (around 09/03/2023).   Claiborne Rigg, FNP-BC [facility] [place], [place] [phone]   03/30/2023, 8:52 PM

## 2023-03-07 LAB — CMP14+EGFR
ALT: 27 [IU]/L (ref 0–44)
AST: 26 [IU]/L (ref 0–40)
Albumin: 4.2 g/dL (ref 3.8–4.9)
Alkaline Phosphatase: 34 [IU]/L — ABNORMAL LOW (ref 44–121)
BUN/Creatinine Ratio: 24 — ABNORMAL HIGH (ref 9–20)
BUN: 24 mg/dL (ref 6–24)
Bilirubin Total: 0.4 mg/dL (ref 0.0–1.2)
CO2: 23 mmol/L (ref 20–29)
Calcium: 10.1 mg/dL (ref 8.7–10.2)
Chloride: 99 mmol/L (ref 96–106)
Creatinine, Ser: 1.02 mg/dL (ref 0.76–1.27)
Globulin, Total: 3 g/dL (ref 1.5–4.5)
Glucose: 113 mg/dL — ABNORMAL HIGH (ref 70–99)
Potassium: 4.5 mmol/L (ref 3.5–5.2)
Sodium: 138 mmol/L (ref 134–144)
Total Protein: 7.2 g/dL (ref 6.0–8.5)
eGFR: 89 mL/min/{1.73_m2} (ref >59)

## 2023-03-07 LAB — MAGNESIUM: Magnesium: 1.7 mg/dL (ref 1.6–2.3)

## 2023-03-07 LAB — HEMOGLOBIN A1C
Est. average glucose Bld gHb Est-mCnc: 163 mg/dL
Hgb A1c MFr Bld: 7.3 % — ABNORMAL HIGH (ref 4.8–5.6)

## 2023-03-07 LAB — LIPID PANEL
Chol/HDL Ratio: 3.9 ratio (ref 0.0–5.0)
Cholesterol, Total: 173 mg/dL (ref 100–199)
HDL: 44 mg/dL (ref >39)
LDL Chol Calc (NIH): 96 mg/dL (ref 0–99)
Triglycerides: 191 mg/dL — ABNORMAL HIGH (ref 0–149)
VLDL Cholesterol Cal: 33 mg/dL (ref 5–40)

## 2023-03-08 ENCOUNTER — Other Ambulatory Visit: Payer: Self-pay | Admitting: Nurse Practitioner

## 2023-03-08 DIAGNOSIS — E1142 Type 2 diabetes mellitus with diabetic polyneuropathy: Secondary | ICD-10-CM

## 2023-03-08 MED ORDER — METFORMIN HCL 500 MG PO TABS
500.0000 mg | ORAL_TABLET | Freq: Every day | ORAL | 3 refills | Status: DC
  Filled 2023-03-08: qty 90, 90d supply, fill #0
  Filled 2023-05-26: qty 90, 90d supply, fill #1

## 2023-03-09 ENCOUNTER — Other Ambulatory Visit: Payer: Self-pay

## 2023-03-10 LAB — GLUCOSE, POCT (MANUAL RESULT ENTRY): POC Glucose: 129 mg/dL — AB (ref 70–99)

## 2023-03-20 ENCOUNTER — Other Ambulatory Visit (HOSPITAL_COMMUNITY): Payer: Self-pay

## 2023-03-20 ENCOUNTER — Other Ambulatory Visit: Payer: Self-pay

## 2023-05-05 ENCOUNTER — Other Ambulatory Visit: Payer: Self-pay

## 2023-05-05 ENCOUNTER — Other Ambulatory Visit (HOSPITAL_COMMUNITY): Payer: Self-pay

## 2023-05-05 MED ORDER — ESCITALOPRAM OXALATE 10 MG PO TABS
15.0000 mg | ORAL_TABLET | Freq: Every day | ORAL | 0 refills | Status: DC
  Filled 2023-05-05: qty 135, 90d supply, fill #0

## 2023-05-05 MED ORDER — QUETIAPINE FUMARATE 25 MG PO TABS
25.0000 mg | ORAL_TABLET | Freq: Every day | ORAL | 0 refills | Status: DC
  Filled 2023-05-05: qty 90, 90d supply, fill #0

## 2023-05-26 ENCOUNTER — Other Ambulatory Visit (HOSPITAL_COMMUNITY): Payer: Self-pay

## 2023-05-26 ENCOUNTER — Other Ambulatory Visit: Payer: Self-pay

## 2023-06-05 ENCOUNTER — Telehealth: Payer: Self-pay | Admitting: Pulmonary Disease

## 2023-06-05 NOTE — Telephone Encounter (Signed)
Received a faxed request for medical records from [facility], [place].   I faxed back to [employer] with a note that all request are to go to the [facility] HIM Dept - fax# [phone]

## 2023-06-09 ENCOUNTER — Encounter: Payer: Self-pay | Admitting: Nurse Practitioner

## 2023-06-10 ENCOUNTER — Other Ambulatory Visit: Payer: Self-pay | Admitting: Nurse Practitioner

## 2023-06-10 DIAGNOSIS — R942 Abnormal results of pulmonary function studies: Secondary | ICD-10-CM

## 2023-06-15 ENCOUNTER — Other Ambulatory Visit (HOSPITAL_COMMUNITY): Payer: Self-pay

## 2023-06-19 ENCOUNTER — Encounter: Payer: Self-pay | Admitting: Pulmonary Disease

## 2023-06-30 ENCOUNTER — Encounter: Payer: Self-pay | Admitting: Nurse Practitioner

## 2023-07-28 ENCOUNTER — Other Ambulatory Visit: Payer: Self-pay

## 2023-07-28 ENCOUNTER — Other Ambulatory Visit (HOSPITAL_COMMUNITY): Payer: Self-pay

## 2023-07-28 MED ORDER — ESCITALOPRAM OXALATE 10 MG PO TABS
15.0000 mg | ORAL_TABLET | Freq: Every evening | ORAL | 0 refills | Status: DC
  Filled 2023-07-28: qty 135, 90d supply, fill #0

## 2023-07-28 MED ORDER — QUETIAPINE FUMARATE 25 MG PO TABS
25.0000 mg | ORAL_TABLET | Freq: Every evening | ORAL | 0 refills | Status: DC
  Filled 2023-07-28: qty 90, 90d supply, fill #0

## 2023-07-29 ENCOUNTER — Other Ambulatory Visit (HOSPITAL_COMMUNITY): Payer: Self-pay

## 2023-08-24 ENCOUNTER — Other Ambulatory Visit (HOSPITAL_COMMUNITY): Payer: Self-pay

## 2023-09-04 ENCOUNTER — Other Ambulatory Visit: Payer: Self-pay

## 2023-09-04 ENCOUNTER — Encounter: Payer: Self-pay | Admitting: Nurse Practitioner

## 2023-09-04 ENCOUNTER — Other Ambulatory Visit (HOSPITAL_COMMUNITY): Payer: Self-pay

## 2023-09-04 ENCOUNTER — Ambulatory Visit: Payer: MEDICAID | Attending: Nurse Practitioner | Admitting: Nurse Practitioner

## 2023-09-04 VITALS — BP 115/76 | HR 69 | Ht 69.0 in | Wt 362.8 lb

## 2023-09-04 DIAGNOSIS — E119 Type 2 diabetes mellitus without complications: Secondary | ICD-10-CM

## 2023-09-04 DIAGNOSIS — I10 Essential (primary) hypertension: Secondary | ICD-10-CM | POA: Diagnosis not present

## 2023-09-04 DIAGNOSIS — E785 Hyperlipidemia, unspecified: Secondary | ICD-10-CM | POA: Diagnosis not present

## 2023-09-04 DIAGNOSIS — Z7985 Long-term (current) use of injectable non-insulin antidiabetic drugs: Secondary | ICD-10-CM

## 2023-09-04 DIAGNOSIS — Z122 Encounter for screening for malignant neoplasm of respiratory organs: Secondary | ICD-10-CM

## 2023-09-04 LAB — POCT GLYCOSYLATED HEMOGLOBIN (HGB A1C): HbA1c, POC (controlled diabetic range): 6.9 % (ref 0.0–7.0)

## 2023-09-04 MED ORDER — LOSARTAN POTASSIUM 100 MG PO TABS
100.0000 mg | ORAL_TABLET | Freq: Every day | ORAL | 1 refills | Status: DC
Start: 2023-09-04 — End: 2024-03-07
  Filled 2023-09-04 – 2023-09-13 (×2): qty 90, 90d supply, fill #0
  Filled 2023-12-09: qty 90, 90d supply, fill #1

## 2023-09-04 MED ORDER — FENOFIBRATE 145 MG PO TABS
145.0000 mg | ORAL_TABLET | Freq: Every day | ORAL | 3 refills | Status: AC
  Filled 2023-09-04 – 2023-10-27 (×2): qty 90, 90d supply, fill #0
  Filled 2024-01-25: qty 90, 90d supply, fill #1
  Filled 2024-04-21: qty 90, 90d supply, fill #2

## 2023-09-04 MED ORDER — AMLODIPINE BESYLATE 10 MG PO TABS
10.0000 mg | ORAL_TABLET | Freq: Every day | ORAL | 1 refills | Status: DC
  Filled 2023-09-04 – 2023-11-22 (×2): qty 90, 90d supply, fill #0
  Filled 2024-02-16: qty 90, 90d supply, fill #1

## 2023-09-04 MED ORDER — ATORVASTATIN CALCIUM 40 MG PO TABS
60.0000 mg | ORAL_TABLET | Freq: Every day | ORAL | 1 refills | Status: DC
  Filled 2023-09-04 – 2023-09-13 (×2): qty 135, 90d supply, fill #0
  Filled 2023-12-10: qty 135, 90d supply, fill #1

## 2023-09-04 MED ORDER — OZEMPIC (0.25 OR 0.5 MG/DOSE) 2 MG/3ML ~~LOC~~ SOPN
0.2500 mg | PEN_INJECTOR | SUBCUTANEOUS | 1 refills | Status: DC
  Filled 2023-09-04 (×2): qty 3, 56d supply, fill #0
  Filled 2023-10-11: qty 3, 56d supply, fill #1

## 2023-09-04 NOTE — Progress Notes (Signed)
 Assessment & Plan:  Alexa "Juan Kline" was seen today for medical management of chronic issues.  Diagnoses and all orders for this visit:  Primary hypertension Weight is trending up.  -     amLODipine  (NORVASC ) 10 MG tablet; Take 1 tablet (10 mg total) by mouth daily. -     losartan  (COZAAR ) 100 MG tablet; Take 1 tablet (100 mg total) by mouth daily. -     CMP14+EGFR Continue all antihypertensives as prescribed.  Reminded to bring in blood pressure log for follow  up appointment.  RECOMMENDATIONS: DASH/Mediterranean Diets are healthier choices for HTN.    Diabetes mellitus treated with injections of non-insulin  medication  Could not tolerate metformin . Start ozempic  today.  -     POCT glycosylated hemoglobin (Hb A1C) -     Urinalysis, Complete -     Microalbumin / creatinine urine ratio -     atorvastatin  (LIPITOR) 40 MG tablet; Take 1.5 tablets (60 mg total) by mouth daily for cholesterol -     Semaglutide ,0.25 or 0.5MG /DOS, (OZEMPIC , 0.25 OR 0.5 MG/DOSE,) 2 MG/3ML SOPN; Inject 0.25 mg into the skin once a week.  Screening for lung cancer -     Cancel: CT CHEST LUNG CA SCREEN LOW DOSE W/O CM; Future -     CT CHEST LUNG CA SCREEN LOW DOSE W/O CM; Future  Dyslipidemia, goal LDL below 70 -     fenofibrate  (TRICOR ) 145 MG tablet; Take 1 tablet (145 mg total) by mouth daily. -     Lipid panel    Patient has been counseled on age-appropriate routine health concerns for screening and prevention. These are reviewed and up-to-date. Referrals have been placed accordingly. Immunizations are up-to-date or declined.    Subjective:   Chief Complaint  Patient presents with   Medical Management of Chronic Issues    Juan Kline 53 y.o. male presents to office today for follow up to HTN and DM  He has a past medical history of Aortic atherosclerosis, Chronic foot ulcer (admitted 03/02/2014), DM2, Diabetic foot infection, First degree AV block, GAD, GERD, migraines, Hyperlipidemia, Hypertension  (Dx 2009), Mild depression, Osteomyelitis left first metatarsal (03/02/2014), Sleep apnea, and Venous stasis ulcer of left lower extremity     He is currently followed by behavioral health for anxiety and depression. TMS is being given as well.    HTN Blood pressure is well controlled with amlodipine  and losartan . He states the metoprolol  he was prescribed for palpitations is not effective. He had had cardiology workup for this already.  BP Readings from Last 3 Encounters:  09/04/23 115/76  03/06/23 124/75  10/16/22 (!) 141/94      DM2 Could not tolerate metformin . States it was causing him to feel fatigued.  His weight is up significantly. Will start ozempic . Obtain urine for protein analysis. A1c improved.  Lab Results  Component Value Date   HGBA1C 6.9 09/04/2023    Lab Results  Component Value Date   HGBA1C 7.3 (H) 03/06/2023    The 10-year ASCVD risk score (Arnett DK, et al., 2019) is: 6.6%   Values used to calculate the score:     Age: 28 years     Sex: Male     Is Non-Hispanic African American: No     Diabetic: Yes     Tobacco smoker: No     Systolic Blood Pressure: 115 mmHg     Is BP treated: Yes     HDL Cholesterol: 44 mg/dL  Total Cholesterol: 173 mg/dL   Lab Results  Component Value Date   CHOL 173 03/06/2023   CHOL 182 02/28/2022   CHOL 168 03/28/2021   Lab Results  Component Value Date   HDL 44 03/06/2023   HDL 49 02/28/2022   HDL 43.80 03/28/2021   Lab Results  Component Value Date   LDLCALC 96 03/06/2023   LDLCALC 108 (H) 02/28/2022   LDLCALC 89 03/28/2021   Lab Results  Component Value Date   TRIG 191 (H) 03/06/2023   TRIG 142 02/28/2022   TRIG 179.0 (H) 03/28/2021   Lab Results  Component Value Date   CHOLHDL 3.9 03/06/2023   CHOLHDL 3.7 02/28/2022   CHOLHDL 4 03/28/2021     Review of Systems  Constitutional:  Negative for fever, malaise/fatigue and weight loss.  HENT: Negative.  Negative for nosebleeds.   Eyes: Negative.   Negative for blurred vision, double vision and photophobia.  Respiratory: Negative.  Negative for cough and shortness of breath.   Cardiovascular: Negative.  Negative for chest pain, palpitations and leg swelling.  Gastrointestinal: Negative.  Negative for heartburn, nausea and vomiting.  Musculoskeletal: Negative.  Negative for myalgias.  Neurological:  Positive for headaches. Negative for dizziness, focal weakness and seizures.  Psychiatric/Behavioral:  Positive for depression. Negative for suicidal ideas. The patient is nervous/anxious.     Past Medical History:  Diagnosis Date   Aortic atherosclerosis (HCC)    Chronic foot ulcer (HCC) admitted 03/02/2014   medial border first metatarsal left foot  - resolved per patient 03/20/20   Diabetes (HCC)    type 2 - no meds, diet controlled   Diabetic foot infection (HCC)    First degree AV block    GAD (generalized anxiety disorder)    GERD (gastroesophageal reflux disease)    Hx of migraines    Hyperlipidemia    Hypertension Dx 2009   Mild depression    Osteomyelitis left first metatarsal 03/02/2014   Sleep apnea    not using CPAP- machine being looked at for repair 03/21/20    Venous stasis ulcer of left lower extremity (HCC)    Maximo Spar 03/02/2014 - resolved per patient 03/20/20    Past Surgical History:  Procedure Laterality Date   BIOPSY  08/18/2019   Procedure: BIOPSY;  Surgeon: Lindle Rhea, MD;  Location: [facility];  Service: Gastroenterology;;   ESOPHAGOGASTRODUODENOSCOPY (EGD) WITH PROPOFOL  N/A 08/18/2019   Procedure: ESOPHAGOGASTRODUODENOSCOPY (EGD) WITH PROPOFOL ;  Surgeon: Lindle Rhea, MD;  Location: [facility];  Service: Gastroenterology;  Laterality: N/A;    Family History  Problem Relation Age of Onset   Hypertension Mother    Hyperlipidemia Mother    Diabetes Mother    Kidney disease Mother    Hypertension Father    Hyperlipidemia Father    Diabetes Brother    Hypertension Brother     Hyperlipidemia Brother    Heart disease Maternal Grandmother    COPD Maternal Grandmother    Diabetes Maternal Grandfather    Heart attack Paternal Grandmother    Heart disease Paternal Grandfather    Colon cancer Neg Hx    Colon polyps Neg Hx    Stomach cancer Neg Hx    Esophageal cancer Neg Hx    Ulcerative colitis Neg Hx     Social History Reviewed with no changes to be made today.   Outpatient Medications Prior to Visit  Medication Sig Dispense Refill   Accu-Chek Softclix Lancets lancets Check blood glucose level by fingerstick once per day.  E11.65 100 each 12   aspirin  81 MG EC tablet Take 1 tablet (81 mg total) by mouth daily. Swallow whole. 30 tablet 11   escitalopram  (LEXAPRO ) 10 MG tablet Take 1.5 tablets (15 mg total) by mouth at bedtime. 135 tablet 0   glucose blood (ACCU-CHEK GUIDE) test strip Use as instructed. Check blood glucose by fingerstick twice per day. 100 each 12   QUEtiapine  (SEROQUEL ) 25 MG tablet Take 1 tablet (25 mg total) by mouth at bedtime. 90 tablet 0   amLODipine  (NORVASC ) 10 MG tablet Take 1 tablet (10 mg total) by mouth daily. 90 tablet 1   atorvastatin  (LIPITOR) 40 MG tablet Take 1.5 tablets (60 mg total) by mouth daily for cholesterol 135 tablet 1   fenofibrate  (TRICOR ) 145 MG tablet Take 1 tablet (145 mg total) by mouth daily. 90 tablet 3   losartan  (COZAAR ) 100 MG tablet Take 1 tablet (100 mg total) by mouth daily. 90 tablet 1   Magnesium  400 MG CAPS Take 400 mg by mouth daily. (Patient not taking: Reported on 09/04/2023)     metFORMIN  (GLUCOPHAGE ) 500 MG tablet Take 1 tablet (500 mg total) by mouth daily with breakfast. (Patient not taking: Reported on 09/04/2023) 90 tablet 3   metoprolol  tartrate (LOPRESSOR ) 25 MG tablet Take 1 tablet (25 mg total) by mouth 2 (two) times daily as needed (PALPATATIONS). (Patient not taking: Reported on 09/04/2023) 60 tablet 0   No facility-administered medications prior to visit.    Allergies  Allergen Reactions    Pregabalin  Anxiety and Swelling       Objective:    BP 115/76   Pulse 69   Ht 5\' 9"  (1.753 m)   Wt (!) 362 lb 12.8 oz (164.6 kg)   SpO2 94%   BMI 53.58 kg/m  Wt Readings from Last 3 Encounters:  09/04/23 (!) 362 lb 12.8 oz (164.6 kg)  03/06/23 (!) 345 lb (156.5 kg)  10/15/22 (!) 325 lb (147.4 kg)    Physical Exam Vitals and nursing note reviewed.  Constitutional:      Appearance: He is well-developed.  HENT:     Head: Normocephalic and atraumatic.  Cardiovascular:     Rate and Rhythm: Normal rate and regular rhythm.     Heart sounds: Normal heart sounds. No murmur heard.    No friction rub. No gallop.  Pulmonary:     Effort: Pulmonary effort is normal. No tachypnea or respiratory distress.     Breath sounds: Normal breath sounds. No decreased breath sounds, wheezing, rhonchi or rales.  Chest:     Chest wall: No tenderness.  Abdominal:     General: Bowel sounds are normal.     Palpations: Abdomen is soft.  Musculoskeletal:        General: Normal range of motion.     Cervical back: Normal range of motion.  Skin:    General: Skin is warm and dry.  Neurological:     Mental Status: He is alert and oriented to person, place, and time.     Coordination: Coordination normal.  Psychiatric:        Behavior: Behavior normal. Behavior is cooperative.        Thought Content: Thought content normal.        Judgment: Judgment normal.          Patient has been counseled extensively about nutrition and exercise as well as the importance of adherence with medications and regular follow-up. The patient was given clear instructions to go  to ER or return to medical center if symptoms don't improve, worsen or new problems develop. The patient verbalized understanding.   Follow-up: Return in about 6 months (around 03/06/2024).   Collins Dean, FNP-BC [facility] [place], [place] [phone]   09/04/2023, 2:02 PM

## 2023-09-05 LAB — LIPID PANEL
Chol/HDL Ratio: 4.1 ratio (ref 0.0–5.0)
Cholesterol, Total: 170 mg/dL (ref 100–199)
HDL: 41 mg/dL (ref >39)
LDL Chol Calc (NIH): 92 mg/dL (ref 0–99)
Triglycerides: 219 mg/dL — ABNORMAL HIGH (ref 0–149)
VLDL Cholesterol Cal: 37 mg/dL (ref 5–40)

## 2023-09-05 LAB — CMP14+EGFR
ALT: 32 IU/L (ref 0–44)
AST: 25 IU/L (ref 0–40)
Albumin: 4.4 g/dL (ref 3.8–4.9)
Alkaline Phosphatase: 39 IU/L — ABNORMAL LOW (ref 44–121)
BUN/Creatinine Ratio: 30 — ABNORMAL HIGH (ref 9–20)
BUN: 28 mg/dL — ABNORMAL HIGH (ref 6–24)
Bilirubin Total: 0.3 mg/dL (ref 0.0–1.2)
CO2: 22 mmol/L (ref 20–29)
Calcium: 9.4 mg/dL (ref 8.7–10.2)
Chloride: 100 mmol/L (ref 96–106)
Creatinine, Ser: 0.94 mg/dL (ref 0.76–1.27)
Globulin, Total: 2.4 g/dL (ref 1.5–4.5)
Glucose: 249 mg/dL — ABNORMAL HIGH (ref 70–99)
Potassium: 4.5 mmol/L (ref 3.5–5.2)
Sodium: 140 mmol/L (ref 134–144)
Total Protein: 6.8 g/dL (ref 6.0–8.5)
eGFR: 98 mL/min/{1.73_m2} (ref >59)

## 2023-09-07 ENCOUNTER — Ambulatory Visit: Payer: Self-pay | Admitting: Nurse Practitioner

## 2023-09-08 ENCOUNTER — Encounter: Payer: Self-pay | Admitting: Nurse Practitioner

## 2023-09-09 ENCOUNTER — Encounter: Payer: Self-pay | Admitting: Nurse Practitioner

## 2023-09-10 ENCOUNTER — Encounter: Payer: Self-pay | Admitting: Nurse Practitioner

## 2023-09-11 ENCOUNTER — Other Ambulatory Visit: Payer: MEDICAID

## 2023-09-14 ENCOUNTER — Other Ambulatory Visit (HOSPITAL_COMMUNITY): Payer: Self-pay

## 2023-10-08 ENCOUNTER — Ambulatory Visit
Admission: RE | Admit: 2023-10-08 | Discharge: 2023-10-08 | Disposition: A | Discharge status: Home / Self Care | Payer: MEDICAID | Source: Ambulatory Visit | Attending: Nurse Practitioner | Admitting: Nurse Practitioner

## 2023-10-08 DIAGNOSIS — Z122 Encounter for screening for malignant neoplasm of respiratory organs: Secondary | ICD-10-CM

## 2023-10-12 ENCOUNTER — Other Ambulatory Visit: Payer: Self-pay

## 2023-10-20 ENCOUNTER — Other Ambulatory Visit: Payer: Self-pay

## 2023-10-20 MED ORDER — QUETIAPINE FUMARATE 25 MG PO TABS
25.0000 mg | ORAL_TABLET | Freq: Every day | ORAL | 0 refills | Status: DC
  Filled 2023-10-21: qty 90, 90d supply, fill #0

## 2023-10-20 MED ORDER — ESCITALOPRAM OXALATE 10 MG PO TABS
15.0000 mg | ORAL_TABLET | Freq: Every day | ORAL | 0 refills | Status: DC
  Filled 2023-10-21: qty 45, 30d supply, fill #0
  Filled 2023-10-22: qty 135, 90d supply, fill #0

## 2023-10-21 ENCOUNTER — Other Ambulatory Visit: Payer: Self-pay

## 2023-10-21 ENCOUNTER — Encounter: Payer: Self-pay | Admitting: Nurse Practitioner

## 2023-10-21 ENCOUNTER — Other Ambulatory Visit: Payer: Self-pay | Admitting: Nurse Practitioner

## 2023-10-21 DIAGNOSIS — E119 Type 2 diabetes mellitus without complications: Secondary | ICD-10-CM

## 2023-10-21 MED ORDER — OZEMPIC (0.25 OR 0.5 MG/DOSE) 2 MG/3ML ~~LOC~~ SOPN
0.5000 mg | PEN_INJECTOR | SUBCUTANEOUS | 1 refills | Status: DC
  Filled 2023-10-21: qty 3, 28d supply, fill #0
  Filled 2023-11-14: qty 3, 28d supply, fill #1

## 2023-10-22 ENCOUNTER — Other Ambulatory Visit (HOSPITAL_COMMUNITY): Payer: Self-pay

## 2023-10-22 ENCOUNTER — Other Ambulatory Visit: Payer: Self-pay

## 2023-10-27 ENCOUNTER — Other Ambulatory Visit: Payer: Self-pay

## 2023-11-10 NOTE — Procedures (Signed)
Mask fit

## 2023-11-16 ENCOUNTER — Other Ambulatory Visit (HOSPITAL_COMMUNITY): Payer: Self-pay

## 2023-11-23 ENCOUNTER — Other Ambulatory Visit: Payer: Self-pay

## 2023-11-23 ENCOUNTER — Other Ambulatory Visit (HOSPITAL_COMMUNITY): Payer: Self-pay

## 2023-12-09 ENCOUNTER — Other Ambulatory Visit (HOSPITAL_COMMUNITY): Payer: Self-pay

## 2023-12-10 ENCOUNTER — Other Ambulatory Visit (HOSPITAL_COMMUNITY): Payer: Self-pay

## 2024-01-02 ENCOUNTER — Encounter: Payer: Self-pay | Admitting: Nurse Practitioner

## 2024-01-04 ENCOUNTER — Other Ambulatory Visit: Payer: Self-pay | Admitting: Nurse Practitioner

## 2024-01-04 DIAGNOSIS — R42 Dizziness and giddiness: Secondary | ICD-10-CM

## 2024-01-05 ENCOUNTER — Encounter (INDEPENDENT_AMBULATORY_CARE_PROVIDER_SITE_OTHER): Payer: Self-pay

## 2024-01-05 ENCOUNTER — Other Ambulatory Visit: Payer: Self-pay

## 2024-01-05 MED ORDER — ESCITALOPRAM OXALATE 10 MG PO TABS
15.0000 mg | ORAL_TABLET | Freq: Every day | ORAL | 0 refills | Status: DC
  Filled 2024-02-06: qty 30, 20d supply, fill #0
  Filled 2024-02-22 (×2): qty 105, 70d supply, fill #1

## 2024-01-05 MED ORDER — QUETIAPINE FUMARATE 25 MG PO TABS
25.0000 mg | ORAL_TABLET | Freq: Every day | ORAL | 0 refills | Status: DC
  Filled 2024-02-06: qty 90, 90d supply, fill #0

## 2024-01-25 ENCOUNTER — Other Ambulatory Visit (HOSPITAL_COMMUNITY): Payer: Self-pay

## 2024-02-01 ENCOUNTER — Ambulatory Visit (INDEPENDENT_AMBULATORY_CARE_PROVIDER_SITE_OTHER): Payer: MEDICAID | Admitting: Physician Assistant

## 2024-02-01 ENCOUNTER — Encounter (INDEPENDENT_AMBULATORY_CARE_PROVIDER_SITE_OTHER): Payer: Self-pay | Admitting: Physician Assistant

## 2024-02-01 VITALS — BP 139/77 | HR 84

## 2024-02-01 DIAGNOSIS — R42 Dizziness and giddiness: Secondary | ICD-10-CM | POA: Diagnosis not present

## 2024-02-01 NOTE — Progress Notes (Unsigned)
 Dear Dr. Theotis, Here is my assessment for our mutual patient, Juan Kline. Thank you for allowing me the opportunity to care for your patient. Please do not hesitate to contact me should you have any other questions. Sincerely, Chyrl Cohen PA-C  Otolaryngology Clinic Note Referring provider: Dr. Theotis HPI:  Juan Kline is a 53 y.o. male kindly referred by Dr. Theotis   The patient is a 53 year old gentleman seen in our office for evaluation of dizziness.  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  He notes that he has occasional episodes when rolling over the bed he gets some room spinning sensation.  He has fluctuating pressure in the ears, he feels better when laying down versus standing up.  He notes the symptoms are worse in the morning.  No associated neurologic deficits.  No history of ear related issues including infections or trauma to the ear.  He has had significant workup previously having seen cardiology, several neurologist with negative MRI negative CT angio head and neck.  He denies any improvement in his symptoms since that time.    Independent Review of Additional Tests or Records:  PCP note 11/19/2023  Dizziness: complains of chronic lightheadedness. He does have a history of PVCs and palpitations and is currently on flecainide  but no history of afib/flutter. Cardiac workup has been essentially negative. The patient describes the symptoms as disequalibirum and lightheadedness. The patient also complains of associated shortness of breath, tinnitus and fatigue. Patient denies syncope or hearing loss . He has been treated with topamax  with little improvement. Previous work up has been with neurology (vestibular migraines/MRI negative essentially) and cardiology (PVS/flecainide ). Recommendations were to increase hydration and compliance with CPAP usage (history of nonadherence).    PMH/Meds/All/SocHx/FamHx/ROS:    Past Medical History:  Diagnosis Date   Aortic atherosclerosis    Chronic foot ulcer (HCC) admitted 03/02/2014   medial border first metatarsal left foot  - resolved per patient 03/20/20   Diabetes (HCC)    type 2 - no meds, diet controlled   Diabetic foot infection (HCC)    First degree AV block    GAD (generalized anxiety disorder)    GERD (gastroesophageal reflux disease)    Hx of migraines    Hyperlipidemia    Hypertension Dx 2009   Mild depression    Osteomyelitis left first metatarsal 03/02/2014   Sleep apnea    not using CPAP- machine being looked at for repair 03/21/20    Venous stasis ulcer of left lower extremity (HCC)    Juan Kline 03/02/2014 - resolved per patient 03/20/20     Past Surgical History:  Procedure Laterality Date   BIOPSY  08/18/2019   Procedure: BIOPSY;  Surgeon: Eda Iha, MD;  Location: [facility];  Service: Gastroenterology;;   ESOPHAGOGASTRODUODENOSCOPY (EGD) WITH PROPOFOL  N/A 08/18/2019   Procedure: ESOPHAGOGASTRODUODENOSCOPY (EGD) WITH PROPOFOL ;  Surgeon: Eda Iha, MD;  Location: [facility];  Service: Gastroenterology;  Laterality: N/A;    Family History  Problem Relation Age of Onset   Hypertension Mother    Hyperlipidemia Mother    Diabetes Mother    Kidney disease Mother    Hypertension Father    Hyperlipidemia Father    Diabetes Brother    Hypertension Brother    Hyperlipidemia Brother    Heart disease Maternal Grandmother    COPD Maternal Grandmother    Diabetes Maternal Grandfather    Heart attack Paternal Grandmother    Heart disease  Paternal Grandfather    Colon cancer Neg Hx    Colon polyps Neg Hx    Stomach cancer Neg Hx    Esophageal cancer Neg Hx    Ulcerative colitis Neg Hx      Social Connections: Socially Isolated (03/02/2023)   Social Connection and Isolation Panel    Frequency of Communication with Friends and Family: Never    Frequency of Social Gatherings with Friends and Family: Never     Attends Religious Services: Never    Database administrator or Organizations: No    Attends Engineer, structural: Not on file    Marital Status: Never married      Current Outpatient Medications:    Accu-Chek Softclix Lancets lancets, Check blood glucose level by fingerstick once per day.  E11.65, Disp: 100 each, Rfl: 12   amLODipine  (NORVASC ) 10 MG tablet, Take 1 tablet (10 mg total) by mouth daily., Disp: 90 tablet, Rfl: 1   aspirin  81 MG EC tablet, Take 1 tablet (81 mg total) by mouth daily. Swallow whole., Disp: 30 tablet, Rfl: 11   atorvastatin  (LIPITOR) 40 MG tablet, Take 1.5 tablets (60 mg total) by mouth daily for cholesterol, Disp: 135 tablet, Rfl: 1   escitalopram  (LEXAPRO ) 10 MG tablet, Take 1.5 tablets (15 mg total) by mouth at bedtime., Disp: 135 tablet, Rfl: 0   fenofibrate  (TRICOR ) 145 MG tablet, Take 1 tablet (145 mg total) by mouth daily., Disp: 90 tablet, Rfl: 3   glucose blood (ACCU-CHEK GUIDE) test strip, Use as instructed. Check blood glucose by fingerstick twice per day., Disp: 100 each, Rfl: 12   losartan  (COZAAR ) 100 MG tablet, Take 1 tablet (100 mg total) by mouth daily., Disp: 90 tablet, Rfl: 1   QUEtiapine  (SEROQUEL ) 25 MG tablet, Take 1 tablet (25 mg total) by mouth at bedtime., Disp: 90 tablet, Rfl: 0   Semaglutide ,0.25 or 0.5MG /DOS, (OZEMPIC , 0.25 OR 0.5 MG/DOSE,) 2 MG/3ML SOPN, Inject 0.5 mg into the skin once a week., Disp: 3 mL, Rfl: 1   Physical Exam:   BP 139/77   Pulse 84   SpO2 93%   Pertinent Findings  CN II-XII intact-no nystagmus Bilateral EAC clear and TM intact with well pneumatized middle ear spaces Anterior rhinoscopy: Septum midline; bilateral inferior turbinates with no hypertrophy No lesions of oral cavity/oropharynx; dentition is in normal limits No obviously palpable neck masses/lymphadenopathy/thyromegaly No respiratory distress or stridor  Seprately Identifiable Procedures:  None  Impression & Plans:  Juan Kline is a 53  y.o. male with the following   Dizziness-   53 year old gentleman presenting today with several years of dizziness.  He does have a component of likely BPPV as related to his rolling over in bed and having some dizziness this is brief.  The majority of his symptoms seem unrelated, he feels dizzy and unstable.  I would recommend an audiological evaluation to assure no significant abnormalities.  I do feel that ongoing outpatient workup for his dizziness is prudent as his symptoms cannot be attributed to BPPV, Mnire's, vestibular migraines, vestibular neuritis.  Once the audiological results are available I will call the patient and discussed them with him.   - f/u phone call discussion with audiological evaluation   Thank you for allowing me the opportunity to care for your patient. Please do not hesitate to contact me should you have any other questions.  Sincerely, Chyrl Cohen PA-C [facility] Phone: [phone] Fax: [phone]  02/01/2024, 1:16 PM

## 2024-02-07 ENCOUNTER — Other Ambulatory Visit (HOSPITAL_COMMUNITY): Payer: Self-pay

## 2024-02-08 ENCOUNTER — Other Ambulatory Visit: Payer: Self-pay

## 2024-02-10 ENCOUNTER — Other Ambulatory Visit (HOSPITAL_COMMUNITY): Payer: Self-pay

## 2024-02-16 ENCOUNTER — Other Ambulatory Visit: Payer: Self-pay

## 2024-02-22 ENCOUNTER — Other Ambulatory Visit: Payer: Self-pay

## 2024-03-07 ENCOUNTER — Ambulatory Visit: Payer: MEDICAID | Attending: Nurse Practitioner | Admitting: Nurse Practitioner

## 2024-03-07 ENCOUNTER — Other Ambulatory Visit: Payer: Self-pay

## 2024-03-07 ENCOUNTER — Other Ambulatory Visit (HOSPITAL_COMMUNITY): Payer: Self-pay

## 2024-03-07 VITALS — BP 132/80 | HR 78 | Resp 19 | Ht 69.0 in | Wt 366.8 lb

## 2024-03-07 DIAGNOSIS — E119 Type 2 diabetes mellitus without complications: Secondary | ICD-10-CM

## 2024-03-07 DIAGNOSIS — Z23 Encounter for immunization: Secondary | ICD-10-CM | POA: Diagnosis not present

## 2024-03-07 DIAGNOSIS — M79604 Pain in right leg: Secondary | ICD-10-CM

## 2024-03-07 DIAGNOSIS — G8929 Other chronic pain: Secondary | ICD-10-CM | POA: Diagnosis not present

## 2024-03-07 DIAGNOSIS — M545 Low back pain, unspecified: Secondary | ICD-10-CM

## 2024-03-07 DIAGNOSIS — Z7985 Long-term (current) use of injectable non-insulin antidiabetic drugs: Secondary | ICD-10-CM | POA: Diagnosis not present

## 2024-03-07 DIAGNOSIS — I10 Essential (primary) hypertension: Secondary | ICD-10-CM | POA: Diagnosis not present

## 2024-03-07 LAB — POCT GLYCOSYLATED HEMOGLOBIN (HGB A1C): Hemoglobin A1C: 8.8 % — AB (ref 4.0–5.6)

## 2024-03-07 MED ORDER — FREESTYLE LIBRE 3 READER DEVI
0 refills | Status: DC
  Filled 2024-03-07: qty 1, 365d supply, fill #0
  Filled 2024-03-09 – 2024-03-16 (×2): qty 1, 90d supply, fill #0

## 2024-03-07 MED ORDER — METFORMIN HCL 500 MG PO TABS
500.0000 mg | ORAL_TABLET | Freq: Two times a day (BID) | ORAL | 1 refills | Status: AC
  Filled 2024-03-07 (×2): qty 180, 90d supply, fill #0

## 2024-03-07 MED ORDER — LOSARTAN POTASSIUM 100 MG PO TABS
100.0000 mg | ORAL_TABLET | Freq: Every day | ORAL | 1 refills | Status: AC
  Filled 2024-03-07 (×2): qty 90, 90d supply, fill #0

## 2024-03-07 MED ORDER — FREESTYLE LIBRE 3 PLUS SENSOR MISC
3 refills | Status: DC
  Filled 2024-03-07 – 2024-03-16 (×3): qty 6, 90d supply, fill #0

## 2024-03-07 MED ORDER — AMLODIPINE BESYLATE 10 MG PO TABS
10.0000 mg | ORAL_TABLET | Freq: Every day | ORAL | 1 refills | Status: AC
  Filled 2024-03-07 – 2024-05-16 (×2): qty 90, 90d supply, fill #0

## 2024-03-07 MED ORDER — ATORVASTATIN CALCIUM 40 MG PO TABS
60.0000 mg | ORAL_TABLET | Freq: Every day | ORAL | 1 refills | Status: AC
  Filled 2024-03-07 (×3): qty 135, 90d supply, fill #0

## 2024-03-07 NOTE — Progress Notes (Signed)
 "  Assessment & Plan:  Juan Kline was seen today for hypertension and leg pain.  Diagnoses and all orders for this visit:  Primary hypertension -     CMP14+EGFR -     amLODipine  (NORVASC ) 10 MG tablet; Take 1 tablet (10 mg total) by mouth daily. -     losartan  (COZAAR ) 100 MG tablet; Take 1 tablet (100 mg total) by mouth daily. Continue all antihypertensives as prescribed.  Reminded to bring in blood pressure log for follow  up appointment.  RECOMMENDATIONS: DASH/Mediterranean Diets are healthier choices for HTN.    Diabetes mellitus treated with injections of non-insulin  medication (HCC) -     atorvastatin  (LIPITOR) 40 MG tablet; Take 1.5 tablets (60 mg total) by mouth daily for cholesterol -     metFORMIN  (GLUCOPHAGE ) 500 MG tablet; Take 1 tablet (500 mg total) by mouth 2 (two) times daily with a meal. -     Continuous Glucose Sensor (FREESTYLE LIBRE 3 PLUS SENSOR) MISC; Change sensor every 15 days. -     Continuous Glucose Receiver (FREESTYLE LIBRE 3 READER) DEVI; Check blood glucose levels continuously E11.65 -     POCT glycosylated hemoglobin (Hb A1C) Uncontrolled diabetes with A1c of 8.8. Previous hypoglycemia with Ozempic . Discussed metformin  and continuous glucose monitoring. - Restart metformin  500 mg twice daily. 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 - Advised blood sugar monitoring and to report metformin  side effects.  Need for influenza vaccination -     Flu vaccine trivalent PF, 6mos and older(Flulaval,Afluria,Fluarix,Fluzone)  Chronic bilateral low back pain without sciatica -     DG Lumbar Spine Complete; Future Chronic pain in right thigh and leg with low back involvement, likely due to osteoarthritis or degeneration. - Ordered x-ray of lower back and hip. - Advised x-ray at [facility], no appointment needed.  Chronic pain of right lower extremity -     DG HIP UNILAT W OR W/O PELVIS 2-3 VIEWS RIGHT; Future Chronic pain in right thigh and leg with low  back involvement, likely due to osteoarthritis or degeneration. - Ordered x-ray of lower back and hip. - Advised x-ray at [facility], no appointment needed.   Patient has been counseled on age-appropriate routine health concerns for screening and prevention. These are reviewed and up-to-date. Referrals have been placed accordingly. Immunizations are up-to-date or declined.    Subjective:   Chief Complaint  Patient presents with   Hypertension   Leg Pain    Right leg     Juan Kline 53 y.o. male presents to office today for follow up to HTN  He has a past medical history of Aortic atherosclerosis, Chronic foot ulcer (admitted 03/02/2014), DM2, Diabetic foot infection, First degree AV block, GAD, GERD, migraines, Hyperlipidemia, Hypertension (Dx 2009), Mild depression, Osteomyelitis left first metatarsal (03/02/2014), Sleep apnea, and Venous stasis ulcer of left lower extremity   HTN Blood pressure is at goal with amlodipine  10 mg daily and losartan  100 mg daily. BP Readings from Last 3 Encounters:  03/07/24 132/80  02/01/24 139/77  09/04/23 115/76      DM 2 He has a history of diabetes and has not been checking his blood sugars regularly. He has stopped taking Ozempic  and has not been on any diabetes medication since. He described low blood sugar readings as 'around eighty.' His A1c was previously as low as 6.9 but is now 8.8. He is concerned that he did not experience weight loss while on Ozempic . He felt tired when previously on metformin .  Lab Results  Component Value Date   HGBA1C 8.8 (A) 03/07/2024    Lab Results  Component Value Date   HGBA1C 6.9 09/04/2023   He has been experiencing right leg pain that initially started in the lower back and then radiated to the right hip and leg. The back pain has improved, but the leg pain persists, particularly with certain movements or prolonged standing. The pain is described as a deep ache that can become sharp with  specific movements, primarily affecting the thigh and sometimes extending to the knee. This has been ongoing for several months, starting shortly after his last appointment in June.      Review of Systems  Constitutional:  Negative for fever, malaise/fatigue and weight loss.  HENT: Negative.  Negative for nosebleeds.   Eyes: Negative.  Negative for blurred vision, double vision and photophobia.  Respiratory: Negative.  Negative for cough and shortness of breath.   Cardiovascular: Negative.  Negative for chest pain, palpitations and leg swelling.  Gastrointestinal: Negative.  Negative for heartburn, nausea and vomiting.  Musculoskeletal:  Positive for back pain and joint pain. Negative for myalgias.  Neurological: Negative.  Negative for dizziness, focal weakness, seizures and headaches.  Psychiatric/Behavioral: Negative.  Negative for suicidal ideas.     Past Medical History:  Diagnosis Date   Aortic atherosclerosis    Chronic foot ulcer (HCC) admitted 03/02/2014   medial border first metatarsal left foot  - resolved per patient 03/20/20   Diabetes (HCC)    type 2 - no meds, diet controlled   Diabetic foot infection (HCC)    First degree AV block    GAD (generalized anxiety disorder)    GERD (gastroesophageal reflux disease)    Hx of migraines    Hyperlipidemia    Hypertension Dx 2009   Mild depression    Osteomyelitis left first metatarsal 03/02/2014   Sleep apnea    not using CPAP- machine being looked at for repair 03/21/20    Venous stasis ulcer of left lower extremity (HCC)    Juan Kline 03/02/2014 - resolved per patient 03/20/20    Past Surgical History:  Procedure Laterality Date   BIOPSY  08/18/2019   Procedure: BIOPSY;  Surgeon: Eda Iha, MD;  Location: [facility];  Service: Gastroenterology;;   ESOPHAGOGASTRODUODENOSCOPY (EGD) WITH PROPOFOL  N/A 08/18/2019   Procedure: ESOPHAGOGASTRODUODENOSCOPY (EGD) WITH PROPOFOL ;  Surgeon: Eda Iha, MD;  Location:  [facility];  Service: Gastroenterology;  Laterality: N/A;    Family History  Problem Relation Age of Onset   Hypertension Mother    Hyperlipidemia Mother    Diabetes Mother    Kidney disease Mother    Hypertension Father    Hyperlipidemia Father    Diabetes Brother    Hypertension Brother    Hyperlipidemia Brother    Heart disease Maternal Grandmother    COPD Maternal Grandmother    Diabetes Maternal Grandfather    Heart attack Paternal Grandmother    Heart disease Paternal Grandfather    Colon cancer Neg Hx    Colon polyps Neg Hx    Stomach cancer Neg Hx    Esophageal cancer Neg Hx    Ulcerative colitis Neg Hx     Social History Reviewed with no changes to be made today.   Outpatient Medications Prior to Visit  Medication Sig Dispense Refill   aspirin  81 MG EC tablet Take 1 tablet (81 mg total) by mouth daily. Swallow whole. 30 tablet 11   fenofibrate  (TRICOR ) 145 MG tablet Take 1  tablet (145 mg total) by mouth daily. 90 tablet 3   Accu-Chek Softclix Lancets lancets Check blood glucose level by fingerstick once per day.  E11.65 100 each 12   amLODipine  (NORVASC ) 10 MG tablet Take 1 tablet (10 mg total) by mouth daily. 90 tablet 1   atorvastatin  (LIPITOR) 40 MG tablet Take 1.5 tablets (60 mg total) by mouth daily for cholesterol 135 tablet 1   escitalopram  (LEXAPRO ) 10 MG tablet Take 1.5 tablets (15 mg total) by mouth at bedtime. 135 tablet 0   glucose blood (ACCU-CHEK GUIDE) test strip Use as instructed. Check blood glucose by fingerstick twice per day. 100 each 12   losartan  (COZAAR ) 100 MG tablet Take 1 tablet (100 mg total) by mouth daily. 90 tablet 1   QUEtiapine  (SEROQUEL ) 25 MG tablet Take 1 tablet (25 mg total) by mouth at bedtime. 90 tablet 0   Semaglutide ,0.25 or 0.5MG /DOS, (OZEMPIC , 0.25 OR 0.5 MG/DOSE,) 2 MG/3ML SOPN Inject 0.5 mg into the skin once a week. 3 mL 1   No facility-administered medications prior to visit.    Allergies  Allergen Reactions    Pregabalin  Anxiety and Swelling       Objective:    BP 132/80 (BP Location: Left Arm, Patient Position: Sitting, Cuff Size: Normal)   Pulse 78   Resp 19   Ht 5' 9 (1.753 m)   Wt (!) 366 lb 12.8 oz (166.4 kg)   SpO2 100%   BMI 54.17 kg/m  Wt Readings from Last 3 Encounters:  03/07/24 (!) 366 lb 12.8 oz (166.4 kg)  09/04/23 (!) 362 lb 12.8 oz (164.6 kg)  03/06/23 (!) 345 lb (156.5 kg)    Physical Exam Vitals and nursing note reviewed.  Constitutional:      Appearance: He is well-developed.  HENT:     Head: Normocephalic and atraumatic.  Cardiovascular:     Rate and Rhythm: Normal rate and regular rhythm.     Heart sounds: Normal heart sounds. No murmur heard.    No friction rub. No gallop.  Pulmonary:     Effort: Pulmonary effort is normal. No tachypnea or respiratory distress.     Breath sounds: Normal breath sounds. No decreased breath sounds, wheezing, rhonchi or rales.  Chest:     Chest wall: No tenderness.  Musculoskeletal:        General: Normal range of motion.     Cervical back: Normal range of motion.  Skin:    General: Skin is warm and dry.  Neurological:     Mental Status: He is alert and oriented to person, place, and time.     Coordination: Coordination normal.  Psychiatric:        Behavior: Behavior normal. Behavior is cooperative.        Thought Content: Thought content normal.        Judgment: Judgment normal.          Patient has been counseled extensively about nutrition and exercise as well as the importance of adherence with medications and regular follow-up. The patient was given clear instructions to go to ER or return to medical center if symptoms don't improve, worsen or new problems develop. The patient verbalized understanding.   Follow-up: Return in about 3 months (around 06/17/2024).   Juan LELON Servant, FNP-BC [facility] [place], [place] [phone]   04/05/2024, 9:06 PM "

## 2024-03-07 NOTE — Patient Instructions (Signed)
 [facility] [address]  [phone]

## 2024-03-08 ENCOUNTER — Other Ambulatory Visit: Payer: Self-pay | Admitting: Nurse Practitioner

## 2024-03-08 ENCOUNTER — Other Ambulatory Visit: Payer: Self-pay

## 2024-03-08 DIAGNOSIS — E1142 Type 2 diabetes mellitus with diabetic polyneuropathy: Secondary | ICD-10-CM

## 2024-03-08 LAB — CMP14+EGFR
ALT: 34 IU/L (ref 0–44)
AST: 32 IU/L (ref 0–40)
Albumin: 4.3 g/dL (ref 3.8–4.9)
Alkaline Phosphatase: 42 IU/L — ABNORMAL LOW (ref 47–123)
BUN/Creatinine Ratio: 23 — ABNORMAL HIGH (ref 9–20)
BUN: 23 mg/dL (ref 6–24)
Bilirubin Total: 0.3 mg/dL (ref 0.0–1.2)
CO2: 24 mmol/L (ref 20–29)
Calcium: 9.8 mg/dL (ref 8.7–10.2)
Chloride: 97 mmol/L (ref 96–106)
Creatinine, Ser: 0.98 mg/dL (ref 0.76–1.27)
Globulin, Total: 2.9 g/dL (ref 1.5–4.5)
Glucose: 244 mg/dL — ABNORMAL HIGH (ref 70–99)
Potassium: 4.8 mmol/L (ref 3.5–5.2)
Sodium: 137 mmol/L (ref 134–144)
Total Protein: 7.2 g/dL (ref 6.0–8.5)
eGFR: 93 mL/min/1.73 (ref >59)

## 2024-03-08 MED ORDER — ACCU-CHEK SOFTCLIX LANCETS MISC
6 refills | Status: AC
  Filled 2024-03-08: qty 100, fill #0
  Filled 2024-03-19 – 2024-04-18 (×2): qty 100, 33d supply, fill #0

## 2024-03-08 MED ORDER — ACCU-CHEK GUIDE TEST VI STRP
ORAL_STRIP | 6 refills | Status: AC
  Filled 2024-03-08: qty 100, fill #0
  Filled 2024-03-09 – 2024-04-18 (×2): qty 100, 33d supply, fill #0
  Filled 2024-04-18: qty 100, 33d supply, fill #1
  Filled 2024-04-18: qty 100, 33d supply, fill #0

## 2024-03-08 MED ORDER — ACCU-CHEK GUIDE W/DEVICE KIT
PACK | 0 refills | Status: AC
  Filled 2024-03-08: qty 1, fill #0
  Filled 2024-03-19: qty 1, 30d supply, fill #0
  Filled 2024-03-21: qty 1, 1d supply, fill #0
  Filled 2024-04-04 – 2024-04-18 (×4): qty 1, 30d supply, fill #0

## 2024-03-09 ENCOUNTER — Other Ambulatory Visit (HOSPITAL_COMMUNITY): Payer: Self-pay

## 2024-03-09 ENCOUNTER — Other Ambulatory Visit: Payer: Self-pay

## 2024-03-11 ENCOUNTER — Other Ambulatory Visit: Payer: Self-pay

## 2024-03-11 ENCOUNTER — Telehealth (HOSPITAL_COMMUNITY): Payer: Self-pay

## 2024-03-11 ENCOUNTER — Other Ambulatory Visit (HOSPITAL_COMMUNITY): Payer: Self-pay

## 2024-03-13 ENCOUNTER — Ambulatory Visit: Payer: Self-pay | Admitting: Nurse Practitioner

## 2024-03-15 ENCOUNTER — Other Ambulatory Visit: Payer: Self-pay

## 2024-03-16 ENCOUNTER — Ambulatory Visit
Admission: RE | Admit: 2024-03-16 | Discharge: 2024-03-16 | Disposition: A | Payer: MEDICAID | Source: Ambulatory Visit | Attending: Nurse Practitioner

## 2024-03-16 DIAGNOSIS — G8929 Other chronic pain: Secondary | ICD-10-CM

## 2024-03-17 ENCOUNTER — Ambulatory Visit (INDEPENDENT_AMBULATORY_CARE_PROVIDER_SITE_OTHER): Payer: MEDICAID | Admitting: Audiology

## 2024-03-17 ENCOUNTER — Other Ambulatory Visit: Payer: Self-pay

## 2024-03-17 ENCOUNTER — Other Ambulatory Visit (HOSPITAL_BASED_OUTPATIENT_CLINIC_OR_DEPARTMENT_OTHER): Payer: Self-pay

## 2024-03-18 ENCOUNTER — Other Ambulatory Visit: Payer: Self-pay

## 2024-03-20 ENCOUNTER — Other Ambulatory Visit (HOSPITAL_COMMUNITY): Payer: Self-pay

## 2024-03-21 ENCOUNTER — Other Ambulatory Visit (HOSPITAL_COMMUNITY): Payer: Self-pay

## 2024-03-21 ENCOUNTER — Other Ambulatory Visit: Payer: Self-pay

## 2024-03-22 ENCOUNTER — Other Ambulatory Visit: Payer: Self-pay

## 2024-03-22 ENCOUNTER — Other Ambulatory Visit (HOSPITAL_COMMUNITY): Payer: Self-pay

## 2024-03-28 ENCOUNTER — Encounter: Payer: Self-pay | Admitting: Nurse Practitioner

## 2024-03-28 ENCOUNTER — Other Ambulatory Visit: Payer: Self-pay

## 2024-03-28 ENCOUNTER — Other Ambulatory Visit: Payer: Self-pay | Admitting: Nurse Practitioner

## 2024-03-28 DIAGNOSIS — M47816 Spondylosis without myelopathy or radiculopathy, lumbar region: Secondary | ICD-10-CM

## 2024-03-28 MED ORDER — ESCITALOPRAM OXALATE 10 MG PO TABS
15.0000 mg | ORAL_TABLET | Freq: Every day | ORAL | 0 refills | Status: AC
  Filled 2024-05-06: qty 135, 90d supply, fill #0

## 2024-03-28 MED ORDER — QUETIAPINE FUMARATE 25 MG PO TABS
25.0000 mg | ORAL_TABLET | Freq: Every day | ORAL | 0 refills | Status: AC
  Filled 2024-05-06: qty 90, 90d supply, fill #0

## 2024-04-05 ENCOUNTER — Other Ambulatory Visit: Payer: Self-pay

## 2024-04-05 ENCOUNTER — Encounter: Payer: Self-pay | Admitting: Nurse Practitioner

## 2024-04-06 ENCOUNTER — Other Ambulatory Visit: Payer: Self-pay

## 2024-04-06 ENCOUNTER — Other Ambulatory Visit (HOSPITAL_COMMUNITY): Payer: Self-pay

## 2024-04-06 NOTE — Progress Notes (Signed)
 Vaccine given at different location.

## 2024-04-16 ENCOUNTER — Other Ambulatory Visit (HOSPITAL_COMMUNITY): Payer: Self-pay

## 2024-04-18 ENCOUNTER — Other Ambulatory Visit: Payer: Self-pay

## 2024-04-18 ENCOUNTER — Other Ambulatory Visit (HOSPITAL_COMMUNITY): Payer: Self-pay

## 2024-04-19 ENCOUNTER — Other Ambulatory Visit (HOSPITAL_COMMUNITY): Payer: Self-pay

## 2024-04-19 ENCOUNTER — Other Ambulatory Visit: Payer: Self-pay

## 2024-04-21 ENCOUNTER — Other Ambulatory Visit: Payer: Self-pay

## 2024-05-06 ENCOUNTER — Other Ambulatory Visit (HOSPITAL_COMMUNITY): Payer: Self-pay

## 2024-05-06 ENCOUNTER — Other Ambulatory Visit: Payer: Self-pay

## 2024-05-16 ENCOUNTER — Other Ambulatory Visit: Payer: Self-pay

## 2024-05-16 ENCOUNTER — Other Ambulatory Visit (HOSPITAL_COMMUNITY): Payer: Self-pay

## 2024-06-17 ENCOUNTER — Ambulatory Visit: Payer: MEDICAID | Admitting: Nurse Practitioner
